# Patient Record
Sex: Female | Born: 1940 | Race: White | Hispanic: No | Marital: Single | Smoking: Former smoker
Health system: Southern US, Community
[De-identification: ages and names within clinical notes are randomized; demographics above are authoritative.]

## PROBLEM LIST (undated history)

## (undated) DIAGNOSIS — J449 Chronic obstructive pulmonary disease, unspecified: Secondary | ICD-10-CM

## (undated) DIAGNOSIS — F419 Anxiety disorder, unspecified: Secondary | ICD-10-CM

## (undated) DIAGNOSIS — M549 Dorsalgia, unspecified: Secondary | ICD-10-CM

## (undated) DIAGNOSIS — J479 Bronchiectasis, uncomplicated: Secondary | ICD-10-CM

## (undated) DIAGNOSIS — S43006A Unspecified dislocation of unspecified shoulder joint, initial encounter: Secondary | ICD-10-CM

## (undated) DIAGNOSIS — G8929 Other chronic pain: Secondary | ICD-10-CM

## (undated) DIAGNOSIS — Z87891 Personal history of nicotine dependence: Secondary | ICD-10-CM

## (undated) DIAGNOSIS — H3322 Serous retinal detachment, left eye: Secondary | ICD-10-CM

## (undated) DIAGNOSIS — E785 Hyperlipidemia, unspecified: Secondary | ICD-10-CM

## (undated) DIAGNOSIS — C44329 Squamous cell carcinoma of skin of other parts of face: Secondary | ICD-10-CM

## (undated) DIAGNOSIS — F411 Generalized anxiety disorder: Secondary | ICD-10-CM

## (undated) DIAGNOSIS — M8580 Other specified disorders of bone density and structure, unspecified site: Secondary | ICD-10-CM

## (undated) HISTORY — DX: Serous retinal detachment, left eye: H33.22

## (undated) HISTORY — DX: Other chronic pain: G89.29

## (undated) HISTORY — DX: Personal history of nicotine dependence: Z87.891

## (undated) HISTORY — DX: Squamous cell carcinoma of skin of other parts of face: C44.329

## (undated) HISTORY — DX: Bronchiectasis, uncomplicated: J47.9

## (undated) HISTORY — PX: OTHER SURGICAL HISTORY: SHX169

## (undated) HISTORY — DX: Unspecified dislocation of unspecified shoulder joint, initial encounter: S43.006A

## (undated) HISTORY — DX: Chronic obstructive pulmonary disease, unspecified: J44.9

## (undated) HISTORY — DX: Generalized anxiety disorder: F41.1

## (undated) HISTORY — DX: Dorsalgia, unspecified: M54.9

## (undated) HISTORY — DX: Anxiety disorder, unspecified: F41.9

## (undated) HISTORY — DX: Hyperlipidemia, unspecified: E78.5

## (undated) HISTORY — DX: Other specified disorders of bone density and structure, unspecified site: M85.80

---

## 1976-01-03 HISTORY — PX: TOTAL ABDOMINAL HYSTERECTOMY: SHX209

## 1989-01-02 HISTORY — PX: LUMBAR DISC SURGERY: SHX700

## 1995-01-03 DIAGNOSIS — F419 Anxiety disorder, unspecified: Secondary | ICD-10-CM

## 1995-01-03 HISTORY — DX: Anxiety disorder, unspecified: F41.9

## 2003-10-26 ENCOUNTER — Ambulatory Visit (HOSPITAL_COMMUNITY): Admission: RE | Admit: 2003-10-26 | Discharge: 2003-10-26 | Payer: Self-pay | Admitting: Internal Medicine

## 2003-11-04 ENCOUNTER — Ambulatory Visit: Payer: Self-pay | Admitting: Internal Medicine

## 2004-10-03 ENCOUNTER — Ambulatory Visit: Payer: Self-pay | Admitting: Internal Medicine

## 2005-04-27 ENCOUNTER — Ambulatory Visit: Payer: Self-pay | Admitting: Internal Medicine

## 2005-05-11 ENCOUNTER — Ambulatory Visit: Payer: Self-pay | Admitting: Internal Medicine

## 2005-10-27 ENCOUNTER — Ambulatory Visit: Payer: Self-pay | Admitting: Internal Medicine

## 2006-01-03 ENCOUNTER — Ambulatory Visit: Payer: Self-pay | Admitting: Internal Medicine

## 2006-01-03 LAB — CONVERTED CEMR LAB
ALT: 19 units/L (ref 0–40)
AST: 26 units/L (ref 0–37)
Albumin: 3.8 g/dL (ref 3.5–5.2)
Alkaline Phosphatase: 83 units/L (ref 39–117)
BUN: 16 mg/dL (ref 6–23)
Basophils Absolute: 0.1 10*3/uL (ref 0.0–0.1)
Basophils Relative: 0.9 % (ref 0.0–1.0)
Bilirubin Urine: NEGATIVE
CO2: 32 meq/L (ref 19–32)
Calcium: 9.5 mg/dL (ref 8.4–10.5)
Chloride: 102 meq/L (ref 96–112)
Chol/HDL Ratio, serum: 4
Cholesterol: 239 mg/dL (ref 0–200)
Creatinine, Ser: 0.9 mg/dL (ref 0.4–1.2)
Eosinophil percent: 3.3 % (ref 0.0–5.0)
GFR calc non Af Amer: 67 mL/min
Glomerular Filtration Rate, Af Am: 81 mL/min/{1.73_m2}
Glucose, Bld: 93 mg/dL (ref 70–99)
HCT: 39.7 % (ref 36.0–46.0)
HDL: 60.3 mg/dL (ref >39.0)
Hemoglobin, Urine: NEGATIVE
Hemoglobin: 14.1 g/dL (ref 12.0–15.0)
Ketones, ur: NEGATIVE mg/dL
LDL DIRECT: 144 mg/dL
Lymphocytes Relative: 32.2 % (ref 12.0–46.0)
MCHC: 35.5 g/dL (ref 30.0–36.0)
MCV: 91.4 fL (ref 78.0–100.0)
Monocytes Absolute: 1.1 10*3/uL — ABNORMAL HIGH (ref 0.2–0.7)
Monocytes Relative: 10.9 % (ref 3.0–11.0)
Neutro Abs: 5.6 10*3/uL (ref 1.4–7.7)
Neutrophils Relative %: 52.7 % (ref 43.0–77.0)
Nitrite: NEGATIVE
Platelets: 304 10*3/uL (ref 150–400)
Potassium: 4.5 meq/L (ref 3.5–5.1)
RBC: 4.34 M/uL (ref 3.87–5.11)
RDW: 11.7 % (ref 11.5–14.6)
Sodium: 139 meq/L (ref 135–145)
Specific Gravity, Urine: 1.025 (ref 1.000–1.03)
TSH: 2.46 microintl units/mL (ref 0.35–5.50)
Total Bilirubin: 0.6 mg/dL (ref 0.3–1.2)
Total Protein, Urine: NEGATIVE mg/dL
Total Protein: 6.8 g/dL (ref 6.0–8.3)
Triglyceride fasting, serum: 114 mg/dL (ref 0–149)
Urine Glucose: NEGATIVE mg/dL
Urobilinogen, UA: 0.2 (ref 0.0–1.0)
VLDL: 23 mg/dL (ref 0–40)
WBC: 10.4 10*3/uL (ref 4.5–10.5)
pH: 6 (ref 5.0–8.0)

## 2006-01-09 ENCOUNTER — Ambulatory Visit: Payer: Self-pay | Admitting: Internal Medicine

## 2006-02-05 ENCOUNTER — Ambulatory Visit: Payer: Self-pay | Admitting: Internal Medicine

## 2006-06-04 ENCOUNTER — Ambulatory Visit: Payer: Self-pay | Admitting: Internal Medicine

## 2006-06-04 LAB — CONVERTED CEMR LAB
ALT: 15 units/L (ref 0–40)
AST: 22 units/L (ref 0–37)
Cholesterol: 157 mg/dL (ref 0–200)
HDL: 50.9 mg/dL (ref >39.0)
LDL Cholesterol: 88 mg/dL (ref 0–99)
Total CHOL/HDL Ratio: 3.1
Triglycerides: 89 mg/dL (ref 0–149)
VLDL: 18 mg/dL (ref 0–40)

## 2006-06-18 ENCOUNTER — Ambulatory Visit: Payer: Self-pay | Admitting: Internal Medicine

## 2006-06-27 ENCOUNTER — Ambulatory Visit: Payer: Self-pay | Admitting: Gastroenterology

## 2006-07-03 HISTORY — PX: COLONOSCOPY: SHX174

## 2006-07-09 ENCOUNTER — Ambulatory Visit: Payer: Self-pay | Admitting: Gastroenterology

## 2006-09-26 ENCOUNTER — Encounter: Payer: Self-pay | Admitting: *Deleted

## 2006-09-26 DIAGNOSIS — Z9079 Acquired absence of other genital organ(s): Secondary | ICD-10-CM | POA: Insufficient documentation

## 2006-09-26 DIAGNOSIS — M545 Low back pain, unspecified: Secondary | ICD-10-CM | POA: Insufficient documentation

## 2006-09-26 DIAGNOSIS — E785 Hyperlipidemia, unspecified: Secondary | ICD-10-CM

## 2006-09-26 DIAGNOSIS — G8929 Other chronic pain: Secondary | ICD-10-CM

## 2006-09-26 HISTORY — DX: Hyperlipidemia, unspecified: E78.5

## 2006-10-26 ENCOUNTER — Telehealth: Payer: Self-pay | Admitting: Internal Medicine

## 2007-04-30 ENCOUNTER — Encounter: Payer: Self-pay | Admitting: Internal Medicine

## 2007-05-16 ENCOUNTER — Ambulatory Visit: Payer: Self-pay | Admitting: Internal Medicine

## 2007-05-16 DIAGNOSIS — R079 Chest pain, unspecified: Secondary | ICD-10-CM | POA: Insufficient documentation

## 2007-05-16 DIAGNOSIS — R0789 Other chest pain: Secondary | ICD-10-CM | POA: Insufficient documentation

## 2007-05-22 ENCOUNTER — Encounter (INDEPENDENT_AMBULATORY_CARE_PROVIDER_SITE_OTHER): Payer: Self-pay | Admitting: *Deleted

## 2007-05-29 ENCOUNTER — Ambulatory Visit: Payer: Self-pay

## 2007-05-29 ENCOUNTER — Encounter: Payer: Self-pay | Admitting: Cardiology

## 2007-10-29 ENCOUNTER — Telehealth: Payer: Self-pay | Admitting: Internal Medicine

## 2007-11-06 ENCOUNTER — Telehealth: Payer: Self-pay | Admitting: Internal Medicine

## 2008-03-03 ENCOUNTER — Telehealth: Payer: Self-pay | Admitting: Internal Medicine

## 2008-04-14 ENCOUNTER — Telehealth: Payer: Self-pay | Admitting: Internal Medicine

## 2008-09-08 ENCOUNTER — Telehealth: Payer: Self-pay | Admitting: Internal Medicine

## 2009-02-02 LAB — HM MAMMOGRAPHY: HM Mammogram: NORMAL

## 2009-02-15 ENCOUNTER — Ambulatory Visit: Payer: Self-pay | Admitting: Internal Medicine

## 2009-02-15 DIAGNOSIS — Z87891 Personal history of nicotine dependence: Secondary | ICD-10-CM | POA: Insufficient documentation

## 2009-02-15 LAB — CONVERTED CEMR LAB
ALT: 16 units/L (ref 0–35)
AST: 23 units/L (ref 0–37)
Albumin: 4.1 g/dL (ref 3.5–5.2)
Alkaline Phosphatase: 95 units/L (ref 39–117)
BUN: 13 mg/dL (ref 6–23)
Basophils Absolute: 0.1 10*3/uL (ref 0.0–0.1)
Basophils Relative: 1 % (ref 0.0–3.0)
Bilirubin, Direct: 0.1 mg/dL (ref 0.0–0.3)
CO2: 32 meq/L (ref 19–32)
Calcium: 9.5 mg/dL (ref 8.4–10.5)
Chloride: 100 meq/L (ref 96–112)
Cholesterol: 287 mg/dL — ABNORMAL HIGH (ref 0–200)
Creatinine, Ser: 0.7 mg/dL (ref 0.4–1.2)
Direct LDL: 190.8 mg/dL
Eosinophils Absolute: 0.2 10*3/uL (ref 0.0–0.7)
Eosinophils Relative: 2.2 % (ref 0.0–5.0)
GFR calc non Af Amer: 88.53 mL/min (ref >60)
Glucose, Bld: 87 mg/dL (ref 70–99)
HCT: 40.4 % (ref 36.0–46.0)
HDL: 59 mg/dL (ref >39.00)
Hemoglobin: 13.1 g/dL (ref 12.0–15.0)
Lymphocytes Relative: 24.2 % (ref 12.0–46.0)
Lymphs Abs: 2.1 10*3/uL (ref 0.7–4.0)
MCHC: 32.5 g/dL (ref 30.0–36.0)
MCV: 93.3 fL (ref 78.0–100.0)
Monocytes Absolute: 0.9 10*3/uL (ref 0.1–1.0)
Monocytes Relative: 11 % (ref 3.0–12.0)
Neutro Abs: 5.2 10*3/uL (ref 1.4–7.7)
Neutrophils Relative %: 61.6 % (ref 43.0–77.0)
Platelets: 294 10*3/uL (ref 150.0–400.0)
Potassium: 4.4 meq/L (ref 3.5–5.1)
RBC: 4.32 M/uL (ref 3.87–5.11)
RDW: 11.8 % (ref 11.5–14.6)
Sodium: 140 meq/L (ref 135–145)
TSH: 2.01 microintl units/mL (ref 0.35–5.50)
Total Bilirubin: 0.6 mg/dL (ref 0.3–1.2)
Total CHOL/HDL Ratio: 5
Total Protein: 7.5 g/dL (ref 6.0–8.3)
Triglycerides: 167 mg/dL — ABNORMAL HIGH (ref 0.0–149.0)
VLDL: 33.4 mg/dL (ref 0.0–40.0)
WBC: 8.5 10*3/uL (ref 4.5–10.5)

## 2009-03-03 ENCOUNTER — Telehealth: Payer: Self-pay | Admitting: Internal Medicine

## 2009-03-23 ENCOUNTER — Encounter: Payer: Self-pay | Admitting: Internal Medicine

## 2009-03-25 ENCOUNTER — Telehealth: Payer: Self-pay | Admitting: Internal Medicine

## 2009-04-20 ENCOUNTER — Ambulatory Visit: Payer: Self-pay | Admitting: Internal Medicine

## 2009-05-11 ENCOUNTER — Ambulatory Visit: Payer: Self-pay | Admitting: Internal Medicine

## 2009-05-11 LAB — CONVERTED CEMR LAB
ALT: 16 units/L (ref 0–35)
AST: 27 units/L (ref 0–37)
Albumin: 4.2 g/dL (ref 3.5–5.2)
Alkaline Phosphatase: 89 units/L (ref 39–117)
Bilirubin, Direct: 0.1 mg/dL (ref 0.0–0.3)
Cholesterol: 208 mg/dL — ABNORMAL HIGH (ref 0–200)
Direct LDL: 132.4 mg/dL
HDL: 59.1 mg/dL (ref >39.00)
Total Bilirubin: 0.6 mg/dL (ref 0.3–1.2)
Total CHOL/HDL Ratio: 4
Total Protein: 7.3 g/dL (ref 6.0–8.3)
Triglycerides: 96 mg/dL (ref 0.0–149.0)
VLDL: 19.2 mg/dL (ref 0.0–40.0)

## 2009-05-17 ENCOUNTER — Encounter: Payer: Self-pay | Admitting: Internal Medicine

## 2009-07-16 ENCOUNTER — Telehealth: Payer: Self-pay | Admitting: Internal Medicine

## 2009-09-27 ENCOUNTER — Encounter: Payer: Self-pay | Admitting: Internal Medicine

## 2009-12-31 ENCOUNTER — Telehealth: Payer: Self-pay | Admitting: Internal Medicine

## 2010-02-01 NOTE — Letter (Signed)
[facility] [address] [place], [place]  [address] Phone: [phone]      May 17, 2009   Megan Peterson [address] [place], [place] [address]  RE:  LAB RESULTS  Dear  [name],  The following is an interpretation of your most recent lab tests.  Please take note of any instructions provided or changes to medications that have resulted from your lab work.  LIPID PANEL:  Good - no changes needed Triglyceride: 96.0   Cholesterol: 208   LDL: 88   HDL: 59.10   Chol/HDL%:  4     Cholesterol is very close to goal. No changes in regimen. Call for quesitons.   Sincerely Yours,    Jacques Navy MD Patient: Megan Peterson Note: All result statuses are Final unless otherwise noted.  Tests: (1) Lipid Panel (LIPID)   Cholesterol          [H]  208 mg/dL                   2-956     ATP III Classification            Desirable:  < 200 mg/dL                    Borderline High:  200 - 239 mg/dL               High:  > = 240 mg/dL   Triglycerides             96.0 mg/dL                  2.1-308.6     Normal:  <150 mg/dL     Borderline High:  578 - 199 mg/dL   HDL                       46.96 mg/dL                 >29.52   VLDL Cholesterol          19.2 mg/dL                  8.4-13.2  CHO/HDL Ratio:  CHD Risk                             4                    Men          Women     1/2 Average Risk     3.4          3.3     Average Risk          5.0          4.4     2X Average Risk          9.6          7.1     3X Average Risk          15.0          11.0                           Tests: (2) Hepatic/Liver Function Panel (HEPATIC)   Total Bilirubin           0.6 mg/dL  0.3-1.2   Direct Bilirubin          0.1 mg/dL                   1.6-1.0   Alkaline Phosphatase      89 U/L                      39-117   AST                       27 U/L                      0-37   ALT                       16 U/L                      0-35   Total Protein              7.3 g/dL                    9.6-0.4   Albumin                   4.2 g/dL                    5.4-0.9  Tests: (3) Cholesterol LDL - Direct (DIRLDL)  Cholesterol LDL - Direct                             132.4 mg/dL     Optimal:  <811 mg/dL     Near or Above Optimal:  100-129 mg/dL     Borderline High:  914-782 mg/dL     High:  956-213 mg/dL     Very High:  >086 mg/dL

## 2010-02-01 NOTE — Assessment & Plan Note (Signed)
Summary: 3 sk f/u per men/#/cd   Vital Signs:  Patient profile:   70 year old female Height:      58 inches Weight:      129 pounds BMI:     27.06 O2 Sat:      96 % on Room air Temp:     97.3 degrees F oral Pulse rate:   75 / minute BP sitting:   136 / 82  (left arm) Cuff size:   regular  Vitals Entered By: Bill Salinas CMA (May 11, 2009 10:58 AM)  O2 Flow:  Room air CC: Megan Peterson here for a follow up visit on pain management/ ab   Primary Care Provider:  Jacques Navy MD  CC:  Megan Peterson here for a follow up visit on pain management/ ab.  History of Present Illness: Patient presents for pain management follow-up. She is intolerant of meloxicam - causes stomach upset. Other anti-9inflammaotry products haven't helped. She does feel she is geting some relief from gabpentin and is willing to increase the dose.   Current Medications (verified): 1)  Xanax 0.5 Mg  Tabs (Alprazolam) .... Take One Tablet 3 Times Daily 2)  Ibuprofen 600 Mg  Tabs (Ibuprofen) .... Take As Needed 3)  Benadryl 25 Mg  Caps (Diphenhydramine Hcl) .... As Needed 4)  Nitrostat 0.4 Mg  Subl (Nitroglycerin) .Marland Kitchen.. 1 Sl Q 5 Min As Needed Chest Pain 5)  Desoximetasone 0.05 % Crea (Desoximetasone) .... Apply Two Times A Day To Rash On Legs. Do Not Apply To Face. 6)  Lovastatin 20 Mg Tabs (Lovastatin) .Marland Kitchen.. 1 By Mouth Qpm For Cholesterol 7)  Hydrocodone-Acetaminophen 5-325 Mg Tabs (Hydrocodone-Acetaminophen) .Marland Kitchen.. 1 By Mouth Q 6 Hrs 8)  Meloxicam 15 Mg Tabs (Meloxicam) .Marland Kitchen.. 1 By Mouth Once Daily 9)  Gabapentin 100 Mg Caps (Gabapentin) .Marland Kitchen.. 1 At Bedtime X 5 Days, 2 At Bedtime X 5 Days, 3 At Bedtime X 5 Days, 3 Two Times A Day X5, 3 Three Times A Day X 5  Allergies (verified): 1)  ! Demerol PMH-FH-SH reviewed-no changes except otherwise noted  Review of Systems  The patient denies anorexia, weight loss, chest pain, dyspnea on exertion, abdominal pain, severe indigestion/heartburn, muscle weakness, difficulty walking, depression,  abnormal bleeding, and angioedema.    Physical Exam  General:  alert, well-developed, well-nourished, and well-hydrated.   Head:  normocephalic and atraumatic.   Eyes:  corneas and lenses clear and no injection.   Lungs:  normal respiratory effort, normal breath sounds, and no wheezes.   Heart:  normal rate and regular rhythm.   Msk:  no joint tenderness, no joint warmth, and no redness over joints.   Pulses:  2+ radial Neurologic:  alert & oriented X3, cranial nerves II-XII intact, and gait normal.   Skin:  turgor normal and color normal.   Psych:  Oriented X3, memory intact for recent and remote, normally interactive, and good eye contact.     Impression & Recommendations:  Problem # 1:  BACK PAIN, CHRONIC (ICD-724.5) reveiwed present strategy.   Plan  D/C meloxicam. She has also failed other non-steroidals         increase gabapentin to 800mg  three times a day          continue hudorcodone/APAP 5/325  The following medications were removed from the medication list:    Meloxicam 15 Mg Tabs (Meloxicam) .Marland Kitchen... 1 by mouth once daily Her updated medication list for this problem includes:    Ibuprofen 600 Mg Tabs (Ibuprofen) .Marland KitchenMarland KitchenMarland KitchenMarland Kitchen  Take as needed    Hydrocodone-acetaminophen 5-325 Mg Tabs (Hydrocodone-acetaminophen) .Marland Kitchen... 1 by mouth q 6 hrs  Complete Medication List: 1)  Xanax 0.5 Mg Tabs (Alprazolam) .... Take one tablet 3 times daily 2)  Ibuprofen 600 Mg Tabs (Ibuprofen) .... Take as needed 3)  Benadryl 25 Mg Caps (Diphenhydramine hcl) .... As needed 4)  Nitrostat 0.4 Mg Subl (Nitroglycerin) .Marland Kitchen.. 1 sl q 5 min as needed chest pain 5)  Desoximetasone 0.05 % Crea (Desoximetasone) .... Apply two times a day to rash on legs. do not apply to face. 6)  Lovastatin 20 Mg Tabs (Lovastatin) .Marland Kitchen.. 1 by mouth qpm for cholesterol 7)  Hydrocodone-acetaminophen 5-325 Mg Tabs (Hydrocodone-acetaminophen) .Marland Kitchen.. 1 by mouth q 6 hrs 8)  Gabapentin 800 Mg Tabs (Gabapentin) .Marland Kitchen.. 1 by mouth three times a  day  Other Orders: TLB-Lipid Panel (80061-LIPID) TLB-Hepatic/Liver Function Pnl (80076-HEPATIC) Prescriptions: GABAPENTIN 800 MG TABS (GABAPENTIN) 1 by mouth three times a day  #90 x 12   Entered and Authorized by:   Jacques Navy MD   Signed by:   Jacques Navy MD on 05/12/2009   Method used:   Electronically to        [facility]       [address]       [place], [place]  [address]       Ph: [phone]       Fax: [phone]   RxID:   [id number]      Immunization History:  Tetanus/Td Immunization History:    Tetanus/Td:  declined (05/11/2009)  Influenza Immunization History:    Influenza:  declined (05/11/2009)  Pneumovax Immunization History:    Pneumovax:  declined (05/11/2009)

## 2010-02-01 NOTE — Assessment & Plan Note (Signed)
Summary: f/u per sarah / # / cd   Vital Signs:  Patient profile:   70 year old female Height:      58 inches (147.32 cm) Weight:      127.50 pounds (57.95 kg) BMI:     26.74 O2 Sat:      93 % on Room air Temp:     97.8 degrees F (36.56 degrees C) oral Pulse rate:   72 / minute BP sitting:   122 / 68  (left arm)  Vitals Entered By: Brenton Grills (April 20, 2009 10:30 AM)  O2 Flow:  Room air CC: pt here for follow up visit/pt states she has not taken or used Butrans patch/aj   Primary Care Provider:  Jacques Navy MD  CC:  pt here for follow up visit/pt states she has not taken or used Butrans patch/aj.  History of Present Illness: Patient presents to discuss pain management. She has a long history of chronic back pain and did have surgery 10+ years ago. She had been on darvocet for years but this is now off market. She did not get relief with Tramadol.  Preventive Screening-Counseling & Management  Alcohol-Tobacco     Year Quit: 1993  Current Medications (verified): 1)  Xanax 0.5 Mg  Tabs (Alprazolam) .... Take One Tablet 3 Times Daily 2)  Ibuprofen 600 Mg  Tabs (Ibuprofen) .... Take As Needed 3)  Benadryl 25 Mg  Caps (Diphenhydramine Hcl) .... As Needed 4)  Nitrostat 0.4 Mg  Subl (Nitroglycerin) .Marland Kitchen.. 1 Sl Q 5 Min As Needed Chest Pain 5)  Desoximetasone 0.05 % Crea (Desoximetasone) .... Apply Two Times A Day To Rash On Legs. Do Not Apply To Face. 6)  Lovastatin 20 Mg Tabs (Lovastatin) .Marland Kitchen.. 1 By Mouth Qpm For Cholesterol 7)  Butrans 5 Mcg/hr Ptwk (Buprenorphine) .... Apply One New Patch Every 7 Days. 8)  Hydrocodone-Acetaminophen 5-325 Mg Tabs (Hydrocodone-Acetaminophen) .Marland Kitchen.. 1 By Mouth Q 6 Hrs  Allergies (verified): 1)  ! Demerol  Past History:  Past Medical History: Last updated: 09/26/2006 BACK PAIN, CHRONIC (ICD-724.5) HYPERLIPIDEMIA (ICD-272.4)    Past Surgical History: Last updated: 09/26/2006 * Hx of BLADDER TACK HYSTERECTOMY, HX OF  (ICD-V45.77)  Family History: Last updated: 05/16/2007 mother- CAD GM - CAD/MI  Social History: Last updated: 05/16/2007 HSG divorced work: disability  Review of Systems  The patient denies anorexia, fever, weight loss, weight gain, decreased hearing, chest pain, syncope, dyspnea on exertion, abdominal pain, muscle weakness, difficulty walking, and depression.    Physical Exam  General:  Well-developed,well-nourished,in no acute distress; alert,appropriate and cooperative throughout examination Head:  normocephalic and atraumatic.   Eyes:  pupils equal and pupils round.   Neck:  supple and full ROM.   Lungs:  normal respiratory effort and normal breath sounds.   Heart:  normal rate and regular rhythm.   Msk:  nl stand, flex, walk, toe/heel walk, step-up, SLR sitting. Nl DTRs, nl sensation to light touch. Tender to palpation CVA lower lumbar spine.    Impression & Recommendations:  Problem # 1:  BACK PAIN, CHRONIC (ICD-724.5) Patient with long standing chronic back pain. Discussed pain management and the concept of using multiple drugs to control pain and minimize use of narcotics.  Plan - APAP three times a day           NSAIDs - Meloxicam 15mg  once daily            Gabapentin on tapering up dosing with a goal of  600mg  three times a day.           Hydrocodone/APAP as needed with caution to not double dose the APAP           ROV 4-6 weeks.   The following medications were removed from the medication list:    Butrans 5 Mcg/hr Ptwk (Buprenorphine) .Marland Kitchen... Apply one new patch every 7 days. Her updated medication list for this problem includes:    Ibuprofen 600 Mg Tabs (Ibuprofen) .Marland Kitchen... Take as needed    Hydrocodone-acetaminophen 5-325 Mg Tabs (Hydrocodone-acetaminophen) .Marland Kitchen... 1 by mouth q 6 hrs    Meloxicam 15 Mg Tabs (Meloxicam) .Marland Kitchen... 1 by mouth once daily  Complete Medication List: 1)  Xanax 0.5 Mg Tabs (Alprazolam) .... Take one tablet 3 times daily 2)  Ibuprofen  600 Mg Tabs (Ibuprofen) .... Take as needed 3)  Benadryl 25 Mg Caps (Diphenhydramine hcl) .... As needed 4)  Nitrostat 0.4 Mg Subl (Nitroglycerin) .Marland Kitchen.. 1 sl q 5 min as needed chest pain 5)  Desoximetasone 0.05 % Crea (Desoximetasone) .... Apply two times a day to rash on legs. do not apply to face. 6)  Lovastatin 20 Mg Tabs (Lovastatin) .Marland Kitchen.. 1 by mouth qpm for cholesterol 7)  Hydrocodone-acetaminophen 5-325 Mg Tabs (Hydrocodone-acetaminophen) .Marland Kitchen.. 1 by mouth q 6 hrs 8)  Meloxicam 15 Mg Tabs (Meloxicam) .Marland Kitchen.. 1 by mouth once daily 9)  Gabapentin 100 Mg Caps (Gabapentin) .Marland Kitchen.. 1 at bedtime x 5 days, 2 at bedtime x 5 days, 3 at bedtime x 5 days, 3 two times a day x5, 3 three times a day x 5 Prescriptions: GABAPENTIN 100 MG CAPS (GABAPENTIN) 1 at bedtime x 5 days, 2 at bedtime x 5 days, 3 at bedtime x 5 days, 3 two times a day x5, 3 three times a day x 5  #105 x 0   Entered by:   Ami Bullins CMA   Authorized by:   Jacques Navy MD   Signed by:   Bill Salinas CMA on 04/20/2009   Method used:   Electronically to        [facility]       [address]       [place], [place]  [address]       Ph: [phone]       Fax: [phone]   RxID:   [id number] MELOXICAM 15 MG TABS (MELOXICAM) 1 by mouth once daily  #30 x 12   Entered by:   Ami Bullins CMA   Authorized by:   Jacques Navy MD   Signed by:   Bill Salinas CMA on 04/20/2009   Method used:   Electronically to        [facility]       [address]       [place], [place]  [address]       Ph: [phone]       Fax: [phone]   RxID:   [id number] HYDROCODONE-ACETAMINOPHEN 5-325 MG TABS (HYDROCODONE-ACETAMINOPHEN) 1 by mouth q 6 hrs  #60 x 2   Entered and Authorized by:   Jacques Navy MD   Signed by:   Jacques Navy MD on 04/20/2009   Method used:   Print then Give to Patient   RxID:   [id number] MELOXICAM  15 MG TABS (MELOXICAM) 1 by mouth once daily  #30 x 12  Entered and Authorized by:   Jacques Navy MD   Signed by:   Jacques Navy MD on 04/20/2009   Method used:   Electronically to        [facility] [address] (retail)       [address]       [place], [place]  [address]       Ph: [phone]       Fax: [phone]   RxID:   [id number] GABAPENTIN 100 MG CAPS (GABAPENTIN) 1 at bedtime x 5 days, 2 at bedtime x 5 days, 3 at bedtime x 5 days, 3 two times a day x5, 3 three times a day x 5  #105 x 0   Entered and Authorized by:   Jacques Navy MD   Signed by:   Jacques Navy MD on 04/20/2009   Method used:   Electronically to        [facility] [address] (retail)       [address]       [place], [place]  [address]       Ph: [phone]       Fax: [phone]   RxID:   [id number]    Preventive Care Screening  Mammogram:    Date:  02/02/2009    Results:  normal

## 2010-02-01 NOTE — Consult Note (Signed)
Summary: [facility]  [facility]   Imported By: Sherian Rein 10/08/2009 07:52:50  _____________________________________________________________________  External Attachment:    Type:   Image     Comment:   External Document

## 2010-02-01 NOTE — Progress Notes (Signed)
Summary: med request/[facility] pt  Phone Note Refill Request Message from:  Fax from Pharmacy on March 03, 2009 1:58 PM   Rx request for Tramadol HCL 50mg  1-2 tablets 8hours as needed  #60 sent by [facility]. Darvocet shows on medication list and not tramadol. Please advise if oK  Initial call taken by: Rock Nephew CMA,  March 03, 2009 2:02 PM  Follow-up for Phone Call        faxed to pharmacy Follow-up by: Margaret Pyle, CMA,  March 03, 2009 2:37 PM    New/Updated Medications: TRAMADOL HCL 50 MG TABS (TRAMADOL HCL) 1 by mouth q 6 hrs as needed Prescriptions: TRAMADOL HCL 50 MG TABS (TRAMADOL HCL) 1 by mouth q 6 hrs as needed  #60 x 1   Entered and Authorized by:   Corwin Levins MD   Signed by:   Corwin Levins MD on 03/03/2009   Method used:   Print then Give to Patient   RxID:   [id number]  done hardcopy to [facility] side B - dahlia Corwin Levins MD  March 03, 2009 2:31 PM

## 2010-02-01 NOTE — Assessment & Plan Note (Signed)
Summary: BREAKING OUT ON LEGS/RX REFILLS-LB   Vital Signs:  Patient profile:   70 year old female Height:      58 inches Weight:      128 pounds BMI:     26.85 O2 Sat:      98 % on Room air Temp:     97.2 degrees F oral Pulse rate:   85 / minute BP sitting:   138 / 78  (left arm) Cuff size:   regular  Vitals Entered By: Bill Salinas CMA (February 15, 2009 9:05 AM)  O2 Flow:  Room air CC: pt c/o rash on both legs itching in nature x 2 weeks/ ab   Primary Care Provider:  Jacques Navy MD  CC:  pt c/o rash on both legs itching in nature x 2 weeks/ ab.  History of Present Illness: Not seen for almost two years. she has done well in the interval. She did have a NST in May '09 - results pending to EMR. She has not had any pain or cardiac symptoms. She has been feeling well.  She presents today for a rash on the distal aspect of both legs and some scattered erythematous macular lesions on the arms. these are intermittently pruritic. No new contact materials of any kind. She has tried a variety many over the counter treatments including cortisone. She has had flea bites in the past but these do not itch like previous episodes.  Current Medications (verified): 1)  Xanax 0.5 Mg  Tabs (Alprazolam) .... Take One Tablet 3 Times Daily 2)  Darvocet-N 100 100-650 Mg  Tabs (Propoxyphene N-Apap) .... Take One Tablet Every 4-6 Hours As Needed 3)  Ibuprofen 600 Mg  Tabs (Ibuprofen) .... Take As Needed 4)  Benadryl 25 Mg  Caps (Diphenhydramine Hcl) .... As Needed 5)  Nitrostat 0.4 Mg  Subl (Nitroglycerin) .Marland Kitchen.. 1 Sl Q 5 Min As Needed Chest Pain  Allergies (verified): 1)  ! Demerol  Past History:  Past Medical History: Last updated: 09/26/2006 BACK PAIN, CHRONIC (ICD-724.5) HYPERLIPIDEMIA (ICD-272.4)    Past Surgical History: Last updated: 09/26/2006 * Hx of BLADDER TACK HYSTERECTOMY, HX OF (ICD-V45.77)  Family History: Last updated: 05/16/2007 mother- CAD GM - CAD/MI  Social  History: Last updated: 05/16/2007 HSG divorced work: disability  Risk Factors: Smoking Status: quit (09/26/2006)  Review of Systems  The patient denies anorexia, fever, weight loss, weight gain, hoarseness, chest pain, syncope, dyspnea on exertion, headaches, hemoptysis, abdominal pain, melena, severe indigestion/heartburn, muscle weakness, transient blindness, depression, abnormal bleeding, angioedema, and breast masses.    Physical Exam  General:  Well-developed,well-nourished,in no acute distress; alert,appropriate and cooperative throughout examination Head:  Normocephalic and atraumatic without obvious abnormalities. No apparent alopecia or balding. Eyes:  No corneal or conjunctival inflammation noted. EOMI. Perrla. Funduscopic exam benign, without hemorrhages, exudates or papilledema. Vision grossly normal. Ears:  External ear exam shows no significant lesions or deformities.  Otoscopic examination reveals clear canals, tympanic membranes are intact bilaterally without bulging, retraction, inflammation or discharge. Hearing is grossly normal bilaterally. Nose:  no external deformity and no external erythema.   Mouth:  Oral mucosa and oropharynx without lesions or exudates.  Teeth in good repair. Neck:  full ROM, no thyromegaly, and no carotid bruits.   Chest Wall:  No deformities, masses, or tenderness noted. Breasts:  No mass, nodules, thickening, tenderness, bulging, retraction, inflamation, nipple discharge or skin changes noted.   Lungs:  Normal respiratory effort, chest expands symmetrically. Lungs are clear to auscultation,  no crackles or wheezes. Heart:  Normal rate and regular rhythm. S1 and S2 normal without gallop, murmur, click, rub or other extra sounds. Abdomen:  soft, non-tender, normal bowel sounds, no distention, no masses, no abdominal hernia, and no hepatomegaly.   Msk:  normal ROM, no joint tenderness, no joint swelling, no joint warmth, and no joint instability.     Pulses:  2+ radial and DP pulses Extremities:  No clubbing, cyanosis, edema, or deformity noted with normal full range of motion of all joints.   Neurologic:  alert & oriented X3, cranial nerves II-XII intact, strength normal in all extremities, gait normal, and DTRs symmetrical and normal.   Skin:  turgor normal and color normal.  Erythematous macular papular rash on the distal LE bilaterally. No excoriations. SubQ red areas on the forearms.  Cervical Nodes:  No lymphadenopathy noted Axillary Nodes:  No palpable lymphadenopathy no R axillary adenopathy and no L axillary adenopathy.   Psych:  Oriented X3, memory intact for recent and remote, normally interactive, good eye contact, and not anxious appearing.     Impression & Recommendations:  Problem # 1:  SKIN RASH (ICD-782.1)  rash that looks like flea bites.  Plan - topicort.05% apply two times a day to legs.  Her updated medication list for this problem includes:    Desoximetasone 0.05 % Crea (Desoximetasone) .Marland Kitchen... Apply two times a day to rash on legs. do not apply to face.  Problem # 2:  HYPERLIPIDEMIA (ICD-272.4)  for lab today - recommendations to follow.  Orders: TLB-Lipid Panel (80061-LIPID) TLB-Hepatic/Liver Function Pnl (80076-HEPATIC) TLB-TSH (Thyroid Stimulating Hormone) ([address]-UEA) Prescription Created Electronically [phone])  Addendum - LDL 190+  Plan - very high LDL in a range where medical therapy is indicated.            will add lovastatin 20mg  ($4 drug at [facility]/etc)  qPM with repeat lab in 4weeks after starting medication.  Her updated medication list for this problem includes:    Lovastatin 20 Mg Tabs (Lovastatin) .Marland Kitchen... 1 by mouth qpm for cholesterol  Problem # 3:  BACK PAIN, CHRONIC (ICD-724.5) Patient with chronic leg pain from bad back. She has been on darvocet - no longer on the market.  Plan - trial of tramadol 50-100mg  q8. If no relief.....          trial of gabapentin with a fairly brisk  titration up to 400mg  three times a day.   The following medications were removed from the medication list:    Aspirin 81 Mg Tabs (Aspirin) .Marland Kitchen... Take one tablet once daily Her updated medication list for this problem includes:    Darvocet-n 100 100-650 Mg Tabs (Propoxyphene n-apap) .Marland Kitchen... Take one tablet every 4-6 hours as needed    Ibuprofen 600 Mg Tabs (Ibuprofen) .Marland Kitchen... Take as needed  Problem # 4:  Preventive Health Care (ICD-V70.0) Normal exam and unremarkable history. She declines pnuemonia vaccine and flu shot. She will have tetnus. she will be referred to [facility] for mammogram. Routine labs today. CXR due to previous smoking history.  In summary - a nice woman back in for routine care and rash.   Addendum - chest s-ray with COPD/emphysematous changes. No acute disease.   Complete Medication List: 1)  Xanax 0.5 Mg Tabs (Alprazolam) .... Take one tablet 3 times daily 2)  Darvocet-n 100 100-650 Mg Tabs (Propoxyphene n-apap) .... Take one tablet every 4-6 hours as needed 3)  Ibuprofen 600 Mg Tabs (Ibuprofen) .... Take as needed 4)  Benadryl  25 Mg Caps (Diphenhydramine hcl) .... As needed 5)  Nitrostat 0.4 Mg Subl (Nitroglycerin) .Marland Kitchen.. 1 sl q 5 min as needed chest pain 6)  Desoximetasone 0.05 % Crea (Desoximetasone) .... Apply two times a day to rash on legs. do not apply to face. 7)  Lovastatin 20 Mg Tabs (Lovastatin) .Marland Kitchen.. 1 by mouth qpm for cholesterol  Other Orders: TLB-BMP (Basic Metabolic Panel-BMET) (80048-METABOL) TLB-CBC Platelet - w/Differential (85025-CBCD) T-2 View CXR (71020TC) Radiology Referral (Radiology)  Patient: Megan Peterson Note: All result statuses are Final unless otherwise noted.  Tests: (1) Lipid Panel (LIPID)   Cholesterol          [H]  287 mg/dL                   6-045     ATP III Classification            Desirable:  < 200 mg/dL                    Borderline High:  200 - 239 mg/dL               High:  > = 240 mg/dL   Triglycerides        [H]   167.0 mg/dL                 4.0-981.1     Normal:  <150 mg/dL     Borderline High:  914 - 199 mg/dL   HDL                       78.29 mg/dL                 >56.21   VLDL Cholesterol          33.4 mg/dL                  3.0-86.5  CHO/HDL Ratio:  CHD Risk                             5                    Men          Women     1/2 Average Risk     3.4          3.3     Average Risk          5.0          4.4     2X Average Risk          9.6          7.1     3X Average Risk          15.0          11.0                           Tests: (2) Hepatic/Liver Function Panel (HEPATIC)   Total Bilirubin           0.6 mg/dL                   7.8-4.6   Direct Bilirubin          0.1 mg/dL                   9.6-2.9  Alkaline Phosphatase      95 U/L                      39-117   AST                       23 U/L                      0-37   ALT                       16 U/L                      0-35   Total Protein             7.5 g/dL                    1.3-0.8   Albumin                   4.1 g/dL                    6.5-7.8  Tests: (3) BMP (METABOL)   Sodium                    140 mEq/L                   135-145   Potassium                 4.4 mEq/L                   3.5-5.1   Chloride                  100 mEq/L                   96-112   Carbon Dioxide            32 mEq/L                    19-32   Glucose                   87 mg/dL                    46-96   BUN                       13 mg/dL                    2-95   Creatinine                0.7 mg/dL                   2.8-4.1   Calcium                   9.5 mg/dL                   3.2-44.0   GFR                       88.53 mL/min                >60  Tests: (4) CBC Platelet w/Diff (CBCD)   White  Cell Count          8.5 K/uL                    4.5-10.5   Red Cell Count            4.32 Mil/uL                 3.87-5.11   Hemoglobin                13.1 g/dL                   16.1-09.6   Hematocrit                40.4 %                       36.0-46.0   MCV                       93.3 fl                     78.0-100.0   MCHC                      32.5 g/dL                   04.5-40.9   RDW                       11.8 %                      11.5-14.6   Platelet Count            294.0 K/uL                  150.0-400.0   Neutrophil %              61.6 %                      43.0-77.0   Lymphocyte %              24.2 %                      12.0-46.0   Monocyte %                11.0 %                      3.0-12.0   Eosinophils%              2.2 %                       0.0-5.0   Basophils %               1.0 %                       0.0-3.0   Neutrophill Absolute      5.2 K/uL                    1.4-7.7   Lymphocyte Absolute       2.1 K/uL                    0.7-4.0  Monocyte Absolute         0.9 K/uL                    0.1-1.0  Eosinophils, Absolute                             0.2 K/uL                    0.0-0.7   Basophils Absolute        0.1 K/uL                    0.0-0.1  Tests: (5) TSH (TSH)   FastTSH                   2.01 uIU/mL                 0.35-5.50  Tests: (6) Cholesterol LDL - Direct (DIRLDL)  Cholesterol LDL - Direct                             190.8 mg/dL     Optimal:  <416 mg/dL     Near or Above Optimal:  100-129 mg/dL     Borderline High:  606-301 mg/dL     High:  601-093 mg/dL     Very High:  >235 mg/dL  DG CHEST 2 VIEW - [id number]   Clinical Data: Tobacco abuse. Ex-smoker.   CHEST - 2 VIEW   Comparison: 10/13/1993   Findings: The cardiomediastinal silhouette is unremarkable. COPD/emphysema again noted. There is no evidence of focal airspace disease, pulmonary nodules, pulmonary edema, pleural effusion, or pneumothorax. No acute bony abnormalities are identified.   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   Read By:  Laruth Bouchard.D.Prescriptions: LOVASTATIN 20 MG TABS (LOVASTATIN) 1 by mouth qPM for cholesterol  #30 x 12   Entered and Authorized by:    Jacques Navy MD   Signed by:   Jacques Navy MD on 02/15/2009   Method used:   Print then Give to Patient   RxID:   [id number] NITROSTAT 0.4 MG  SUBL (NITROGLYCERIN) 1 sl q 5 min as needed chest pain  #15 x 1   Entered and Authorized by:   Jacques Navy MD   Signed by:   Jacques Navy MD on 02/15/2009   Method used:   Print then Give to Patient   RxID:   [id number] Prudy Feeler 0.5 MG  TABS (ALPRAZOLAM) Take one tablet 3 times daily  #90 x 5   Entered and Authorized by:   Jacques Navy MD   Signed by:   Jacques Navy MD on 02/15/2009   Method used:   Print then Give to Patient   RxID:   [id number] DESOXIMETASONE 0.05 % CREA (DESOXIMETASONE) apply two times a day to rash on legs. Do not apply to face.  #30g x 1   Entered and Authorized by:   Jacques Navy MD   Signed by:   Jacques Navy MD on 02/15/2009   Method used:   Print then Give to Patient   RxID:   [id number]

## 2010-02-01 NOTE — Progress Notes (Signed)
Summary: Tramadol  Phone Note Call from Patient Call back at Home Phone [phone]   Summary of Call: Patient called stating that Tramadol makes her feel horrible. Please send alternative to [facility] on [address]. Please advise. Initial call taken by: Lucious Groves,  March 25, 2009 11:07 AM  Follow-up for Phone Call        done Follow-up by: Etta Grandchild MD,  March 25, 2009 11:53 AM  Additional Follow-up for Phone Call Additional follow up Details #1::        pt informed she will come get this afternoon, rx up front in metal cabinet. Additional Follow-up by: Ami Bullins CMA,  March 25, 2009 1:38 PM    New/Updated Medications: BUTRANS 5 MCG/HR PTWK (BUPRENORPHINE) Apply one new patch every 7 days. Prescriptions: BUTRANS 5 MCG/HR PTWK (BUPRENORPHINE) Apply one new patch every 7 days.  #4 x 3   Entered and Authorized by:   Etta Grandchild MD   Signed by:   Etta Grandchild MD on 03/25/2009   Method used:   Print then Give to Patient   RxID:   [id number]

## 2010-02-01 NOTE — Progress Notes (Signed)
Summary: PAIN MEDICATION ALTERNATIVE  Phone Note From Pharmacy   Caller: [facility] [phone] opt 0 - [place] Summary of Call: Rx given for patient for butrans patch is not covered by her insurance. Pharmacy spoke with pt to try to help MD with alternative suggestion.  Insurance reccomends fentanyl. Darvocet worked for pt in the past and pharm suggests low dose vicodin.  Initial call taken by: Lamar Sprinkles, CMA,  March 25, 2009 5:47 PM  Follow-up for Phone Call        cancel butrans patch - ok for hydrocodone/APAP  5/325 1 every 6 hours as needed, # 40. Needs an appointment next week to discuss pain management Follow-up by: Jacques Navy MD,  March 26, 2009 10:19 AM  Additional Follow-up for Phone Call Additional follow up Details #1::        Called in rx, canceled previous, pt aware and transferred to scheduler for f/u  Additional Follow-up by: Lamar Sprinkles, CMA,  March 26, 2009 12:53 PM    New/Updated Medications: HYDROCODONE-ACETAMINOPHEN 5-325 MG TABS (HYDROCODONE-ACETAMINOPHEN) 1 by mouth q 6 hrs Prescriptions: HYDROCODONE-ACETAMINOPHEN 5-325 MG TABS (HYDROCODONE-ACETAMINOPHEN) 1 by mouth q 6 hrs  #40 x 0   Entered by:   Lucious Groves   Authorized by:   Tresa Garter MD   Signed by:   Lucious Groves on 03/26/2009   Method used:   Printed then faxed to ...       [facility] [address] (retail)       [address], [address]       [place], [place]  [address]       Ph: [phone]       Fax: [phone]   RxID:   [id number]

## 2010-02-01 NOTE — Progress Notes (Signed)
  Phone Note Refill Request Message from:  Fax from Pharmacy on July 16, 2009 8:42 AM  Refills Requested: Medication #1:  XANAX 0.5 MG  TABS Take one tablet 3 times daily   Last Refilled: 07/15/2009  Medication #2:  HYDROCODONE-ACETAMINOPHEN 5-325 MG TABS 1 by mouth q 6 hrs   Last Refilled: 06/17/2009 Fax from [facility] in [place], please Advise refill  Initial call taken by: Ami Bullins CMA,  July 16, 2009 8:47 AM  Follow-up for Phone Call        OK for refills x 5 Follow-up by: Jacques Navy MD,  July 16, 2009 1:17 PM    Prescriptions: HYDROCODONE-ACETAMINOPHEN 5-325 MG TABS (HYDROCODONE-ACETAMINOPHEN) 1 by mouth q 6 hrs  #60 x 5   Entered by:   Ami Bullins CMA   Authorized by:   Jacques Navy MD   Signed by:   Bill Salinas CMA on 07/16/2009   Method used:   Telephoned to ...       [facility] [address] (retail)       [address]       [place], [place]  [address]       Ph: [phone]       Fax: [phone]   RxID:   [id number] Prudy Feeler 0.5 MG  TABS (ALPRAZOLAM) Take one tablet 3 times daily  #90 x 5   Entered by:   Bill Salinas CMA   Authorized by:   Jacques Navy MD   Signed by:   Bill Salinas CMA on 07/16/2009   Method used:   Telephoned to ...       [facility] [address] (retail)       [address]       [place], [place]  [address]       Ph: [phone]       Fax: [phone]   RxID:   [id number]

## 2010-02-03 NOTE — Progress Notes (Signed)
Summary: RF  Phone Note Refill Request Message from:  Pharmacy  Refills Requested: Medication #1:  XANAX 0.5 MG  TABS Take one tablet 3 times daily  Medication #2:  HYDROCODONE-ACETAMINOPHEN 5-325 MG TABS 1 by mouth q 6 hrs [id number]  Initial call taken by: Lamar Sprinkles, CMA,  December 31, 2009 5:13 PM    Prescriptions: Prudy Feeler 0.5 MG  TABS (ALPRAZOLAM) Take one tablet 3 times daily  #90 x 5   Entered and Authorized by:   Jacques Navy MD   Signed by:   Jacques Navy MD on 12/31/2009   Method used:   Telephoned to ...       [facility] (retail)       [address]       [place], [place]  [address]       Ph: [phone]       Fax: [phone]   RxID:   [id number] HYDROCODONE-ACETAMINOPHEN 5-325 MG TABS (HYDROCODONE-ACETAMINOPHEN) 1 by mouth q 6 hrs  #60 x 5   Entered and Authorized by:   Jacques Navy MD   Signed by:   Jacques Navy MD on 12/31/2009   Method used:   Telephoned to ...       [facility] [address] (retail)       [address]       [place], [place]  [address]       Ph: [phone]       Fax: [phone]   RxID:   [id number]

## 2010-04-03 DIAGNOSIS — S43006A Unspecified dislocation of unspecified shoulder joint, initial encounter: Secondary | ICD-10-CM

## 2010-04-03 HISTORY — DX: Unspecified dislocation of unspecified shoulder joint, initial encounter: S43.006A

## 2010-04-10 ENCOUNTER — Emergency Department (HOSPITAL_COMMUNITY): Payer: Medicare Other

## 2010-04-10 ENCOUNTER — Emergency Department (HOSPITAL_COMMUNITY)
Admission: EM | Admit: 2010-04-10 | Discharge: 2010-04-10 | Disposition: A | Discharge status: Home / Self Care | Payer: Medicare Other | Attending: Emergency Medicine | Admitting: Emergency Medicine

## 2010-04-10 DIAGNOSIS — E7800 Pure hypercholesterolemia, unspecified: Secondary | ICD-10-CM | POA: Insufficient documentation

## 2010-04-10 DIAGNOSIS — M25519 Pain in unspecified shoulder: Secondary | ICD-10-CM | POA: Insufficient documentation

## 2010-04-10 DIAGNOSIS — I10 Essential (primary) hypertension: Secondary | ICD-10-CM | POA: Insufficient documentation

## 2010-04-10 DIAGNOSIS — S42209A Unspecified fracture of upper end of unspecified humerus, initial encounter for closed fracture: Secondary | ICD-10-CM | POA: Insufficient documentation

## 2010-05-20 NOTE — Assessment & Plan Note (Signed)
[facility]                           PRIMARY CARE OFFICE NOTE   Megan, Peterson                         MRN:          [id number]  DATE:01/09/2006                            DOB:          [birth year]    [name] is a very pleasant 70 year old woman who presents for followup  evaluation and exam.  She was last seen October 27, 2005 at which time  she was stable and doing well.  Previous to that she was seen April 27, 2005, again stable and doing well.   PAST MEDICAL HISTORY:  Well documented in my note of October 03, 2004,  with no significant changes noted.   FAMILY HISTORY:  Well documented in my note of October 03, 2004, with no  significant changes noted.   SOCIAL HISTORY:  Well documented in my note of October 03, 2004, with no  significant changes noted.   CURRENT MEDICATIONS:  1. Xanax 0.5 mg t.i.d.  2. Darvocet N-100 q. 4-6 hours.  3. Baby aspirin 81 mg daily.  4. Trazodone 50 mg q.h.s.  5. Simvastatin 40 mg daily.  6. Ibuprofen 600 mg p.r.n.  7. Vicodin 5/500 t.i.d. p.r.n.   REVIEW OF SYSTEMS:  Negative for constitutional, cardiovascular,  respiratory, GI, GU problems.   CHART REVIEW:  Last colorectal screening was in July of 1998 with  flexible sigmoidoscopy.  The patient was last seen in integrative  therapies in 2001.  Last mammogram was December 20, 2005, was negative  study.  Last EKG was November 04, 2003 and this was a normal study.   PHYSICAL EXAMINATION:  Temperature was 97.6, blood pressure 128/77,  pulse 71, weight 125.  GENERAL APPEARANCE:  A well-nourished, well-developed woman in no acute  distress.  HEENT:  Normocephalic/atraumatic, EACs and TMs were normal.  Oropharynx  with native dentition, with no buccal or palate lesions  noted.  Posterior pharynx was clear.  Conjunctivae and sclerae was clear,  PERRLA/EOMI.  FUNDUSCOPIC:  Unremarkable.  NECK: Supple without thyromegaly.  NODES:  No adenopathy was noted in  the cervical supraclavicular regions.  CHEST:  No CVA tenderness.  LUNGS:  Clear with no rales, wheezes, or rhonchi.  BREAST:  Skin was normal, nipples without discharge.  No fixed mass,  lesion, or abnormality was appreciated.  CARDIOVASCULAR:  Was 2+ radial pulse, no JVD or carotid bruits.  She had  a quiet precordium with regular rate and rhythm without murmurs, rubs,  or gallops.  ABDOMEN:  Soft, no guarding or rebound.  No organosplenomegaly was  appreciated.  PELVIC AND RECTAL:  Deferred.  EXTREMITIES:  Without clubbing, cyanosis, edema, or deformity.  NEUROLOGIC:  Nonfocal.  SKIN:  Was clear.   DATABASE:  Hemoglobin 14.1 g, white count 10,400 with a normal  differential.  Chemistries were unremarkable with a serum glucose of 93.  Kidney function normal with a creatinine of 0.9 and a GFR of 67.  Liver  functions were normal.  Cholesterol 239, triglycerides 114, HDL 60.3,  LDL was 144.   ASSESSMENT/PLAN:  1. Hyperlipidemia.  Patient at this time is  on Pravachol 40.  Will      switch at this point to simvastatin 40 mg for better results with      the goal being an LDL cholesterol of 100 or less.  2. Chronic back pain.  This is stable.  The patient is able to enjoy      most of her usual activities.  Her pain is well managed with her      current regimen.  3. Depression.  The patient is doing well with trazodone and Xanax,      and reports that things are stable in her life.  4. Health maintenance.  The patient is current and  up to date with      breast cancer screening.  She wants to defer colorectal cancer      screening until age 28 when she has better coverage for      colonoscopy.  She has had no change in her bowel habit.   SUMMARY:  This is a very pleasant woman who seems to be medically stable  at this time.  She will return to see me on a p.r.n. basis.     Rosalyn Gess Norins, MD  Electronically Signed    MEN/MedQ  DD: 01/10/2006  DT: 01/10/2006  Job #:  [id number]   cc:   Andi Devon

## 2010-05-25 ENCOUNTER — Other Ambulatory Visit: Payer: Self-pay | Admitting: Internal Medicine

## 2010-06-27 ENCOUNTER — Ambulatory Visit (INDEPENDENT_AMBULATORY_CARE_PROVIDER_SITE_OTHER): Payer: Medicare Other | Admitting: Internal Medicine

## 2010-06-27 ENCOUNTER — Telehealth: Payer: Self-pay

## 2010-06-27 ENCOUNTER — Encounter: Payer: Self-pay | Admitting: Internal Medicine

## 2010-06-27 DIAGNOSIS — J40 Bronchitis, not specified as acute or chronic: Secondary | ICD-10-CM

## 2010-06-27 DIAGNOSIS — Z1231 Encounter for screening mammogram for malignant neoplasm of breast: Secondary | ICD-10-CM

## 2010-06-27 DIAGNOSIS — S43006A Unspecified dislocation of unspecified shoulder joint, initial encounter: Secondary | ICD-10-CM

## 2010-06-27 MED ORDER — ALPRAZOLAM 0.5 MG PO TABS: 0.5000 mg | ORAL_TABLET | Freq: Three times a day (TID) | ORAL | Status: DC | PRN

## 2010-06-27 MED ORDER — SULFAMETHOXAZOLE-TRIMETHOPRIM 800-160 MG PO TABS: 1.0000 | ORAL_TABLET | Freq: Two times a day (BID) | ORAL | Status: AC

## 2010-06-27 NOTE — Patient Instructions (Signed)
Cough - probable bronchitis. Plan - septra DS twice a day for 7 days. Mucinex two tablets twice a day for 7 days. robtiussin DM for cough.  Health maintenance - please get a mammogram. My staff will schedule.  Right shoulder - after dislocation there is still loss of range of motion. Plan - referral physical therapy.

## 2010-06-27 NOTE — Progress Notes (Signed)
  Subjective:    Patient ID: Megan Peterson, female    DOB: [birth year], 70 y.o.   MRN: [id number]  HPI Megan Peterson presents for URI symptoms with chest congestion, cough productive of a purulent looking sputum. She denies any fever, no SOB. She has no loss of energy. No other contributing symptoms.   She had traumatic dislocation of right shoulder. ED records pulled and reviewed, including imaging studies.   Past Medical History  Diagnosis Date  . Back pain, chronic   . Hyperlipidemia    Past Surgical History  Procedure Date  . Bladder tack   . Hysterectomy     history of   Family History  Problem Relation Age of Onset  . Coronary artery disease Mother   . Coronary artery disease      grandmother  . Heart attack      grandmother   History   Social History  . Marital Status: Divorced    Spouse Name: N/A    Number of Children: N/A  . Years of Education: N/A   Occupational History  . Not on file.   Social History Main Topics  . Smoking status: Former Smoker    Quit date: 01/03/1991  . Smokeless tobacco: Not on file  . Alcohol Use: Not on file  . Drug Use: Not on file  . Sexually Active: Not on file   Other Topics Concern  . Not on file   Social History Narrative   HSGDivorcedDisability       Review of Systemsb Review of Systems  Constitutional:  Negative for fever, chills, activity change and unexpected weight change.  HEENT:  Negative for hearing loss, ear pain, congestion, neck stiffness and postnasal drip. Negative for sore throat or swallowing problems. Negative for dental complaints.   Eyes: Negative for vision loss or change in visual acuity.  Respiratory: Negative for chest tightness and wheezing.   Cardiovascular: Negative for chest pain and palpitation. No decreased exercise tolerance Gastrointestinal: No change in bowel habit. No bloating or gas. No reflux or indigestion Genitourinary: Negative for urgency, frequency, flank pain and difficulty  urinating.  Musculoskeletal: Negative for myalgias, back pain, arthralgias and gait problem.  Neurological: Negative for dizziness, tremors, weakness and headaches.  Hematological: Negative for adenopathy.  Psychiatric/Behavioral: Negative for behavioral problems and dysphoric mood.       Objective:   Physical Exam Vitals noted Gen'l - WNMWD white woman in no distress HEENT - normal Neck - stable, thyromegaly Chest - no rales, wheeze or rhonchi Nodes - negative supraclavicular or cervical Extremities - no deformity.        Assessment & Plan:  1. URI - symptoms and presentation suggestive of bronchitis.  Plan - Septra DS bid x 7 and supportive care.   2. Shoulder injury - patient had traumatic dislocation of right shoulder that was reduced by Dr. Rennis Chris in the ED. She now has restricted range of motion with adduction  Plan - refer to PT to prevent frozen shoulder and further loss of ROM  3. Health maintenance - she is due for mammography.

## 2010-06-27 NOTE — Telephone Encounter (Signed)
abstract

## 2010-07-12 ENCOUNTER — Encounter: Payer: Self-pay | Admitting: Internal Medicine

## 2010-07-26 ENCOUNTER — Ambulatory Visit: Payer: Medicare Other

## 2010-07-27 ENCOUNTER — Telehealth: Payer: Self-pay | Admitting: *Deleted

## 2010-07-27 MED ORDER — HYDROCODONE-ACETAMINOPHEN 5-325 MG PO TABS: 1.0000 | ORAL_TABLET | Freq: Four times a day (QID) | ORAL | Status: DC | PRN

## 2010-07-27 NOTE — Telephone Encounter (Signed)
Ok with 2 refills

## 2010-07-27 NOTE — Telephone Encounter (Signed)
Patient requesting RF of hydrocodone to go to [facility] in [place].

## 2010-07-27 NOTE — Telephone Encounter (Signed)
Done, pt aware.

## 2010-08-03 DIAGNOSIS — H3322 Serous retinal detachment, left eye: Secondary | ICD-10-CM

## 2010-08-03 HISTORY — DX: Serous retinal detachment, left eye: H33.22

## 2010-08-05 ENCOUNTER — Observation Stay (HOSPITAL_COMMUNITY)
Admission: AD | Admit: 2010-08-05 | Discharge: 2010-08-06 | Disposition: A | Discharge status: Home / Self Care | Payer: Medicare Other | Source: Ambulatory Visit | Attending: Ophthalmology | Admitting: Ophthalmology

## 2010-08-05 ENCOUNTER — Encounter (INDEPENDENT_AMBULATORY_CARE_PROVIDER_SITE_OTHER): Payer: Medicare Other | Admitting: Ophthalmology

## 2010-08-05 DIAGNOSIS — Z01812 Encounter for preprocedural laboratory examination: Secondary | ICD-10-CM | POA: Insufficient documentation

## 2010-08-05 DIAGNOSIS — H33009 Unspecified retinal detachment with retinal break, unspecified eye: Secondary | ICD-10-CM

## 2010-08-05 DIAGNOSIS — H33019 Retinal detachment with single break, unspecified eye: Principal | ICD-10-CM | POA: Insufficient documentation

## 2010-08-05 DIAGNOSIS — H35379 Puckering of macula, unspecified eye: Secondary | ICD-10-CM

## 2010-08-05 DIAGNOSIS — Z87891 Personal history of nicotine dependence: Secondary | ICD-10-CM | POA: Insufficient documentation

## 2010-08-05 DIAGNOSIS — J4489 Other specified chronic obstructive pulmonary disease: Secondary | ICD-10-CM | POA: Insufficient documentation

## 2010-08-05 DIAGNOSIS — M549 Dorsalgia, unspecified: Secondary | ICD-10-CM | POA: Insufficient documentation

## 2010-08-05 DIAGNOSIS — Z01810 Encounter for preprocedural cardiovascular examination: Secondary | ICD-10-CM | POA: Insufficient documentation

## 2010-08-05 DIAGNOSIS — H43819 Vitreous degeneration, unspecified eye: Secondary | ICD-10-CM

## 2010-08-05 DIAGNOSIS — J449 Chronic obstructive pulmonary disease, unspecified: Secondary | ICD-10-CM | POA: Insufficient documentation

## 2010-08-05 DIAGNOSIS — G8929 Other chronic pain: Secondary | ICD-10-CM | POA: Insufficient documentation

## 2010-08-05 LAB — SURGICAL PCR SCREEN
MRSA, PCR: NEGATIVE
Staphylococcus aureus: NEGATIVE

## 2010-08-05 LAB — CBC
HCT: 40.7 % (ref 36.0–46.0)
Hemoglobin: 14.3 g/dL (ref 12.0–15.0)
MCH: 30.8 pg (ref 26.0–34.0)
MCHC: 35.1 g/dL (ref 30.0–36.0)
MCV: 87.7 fL (ref 78.0–100.0)
Platelets: 271 10*3/uL (ref 150–400)
RBC: 4.64 MIL/uL (ref 3.87–5.11)
RDW: 12.8 % (ref 11.5–15.5)
WBC: 13.4 10*3/uL — ABNORMAL HIGH (ref 4.0–10.5)

## 2010-08-12 ENCOUNTER — Inpatient Hospital Stay (INDEPENDENT_AMBULATORY_CARE_PROVIDER_SITE_OTHER): Payer: Medicare Other | Admitting: Ophthalmology

## 2010-08-12 DIAGNOSIS — H33009 Unspecified retinal detachment with retinal break, unspecified eye: Secondary | ICD-10-CM

## 2010-08-17 ENCOUNTER — Encounter (INDEPENDENT_AMBULATORY_CARE_PROVIDER_SITE_OTHER): Payer: Medicare Other | Admitting: Ophthalmology

## 2010-08-17 DIAGNOSIS — H33009 Unspecified retinal detachment with retinal break, unspecified eye: Secondary | ICD-10-CM

## 2010-08-18 NOTE — Op Note (Signed)
  [name], Megan Peterson                  ACCOUNT NO.:  [id number]  MEDICAL RECORD NO.:  [id number]  LOCATION:  5125                         FACILITY:  [facility]  PHYSICIAN:  Beulah Gandy. Ashley Royalty, M.D. DATE OF BIRTH:  01-Jun-1940  DATE OF PROCEDURE:  08/05/2010 DATE OF DISCHARGE:                              OPERATIVE REPORT   ADMISSION DIAGNOSIS:  Rhegmatogenous retinal detachment, left eye.  PROCEDURE:  Scleral buckle, left eye.  Retinal photocoagulation, left eye.  SURGEON:  Beulah Gandy. Ashley Royalty, MD  ASSISTANT:  Rosalie Doctor, MA  ANESTHESIA:  General.  DETAILS:  Usual prep and drape, 360 degrees limbal peritomy, isolation of four rectus muscles on 2-0 silk, localization of break at 12 o'clock. Scleral dissection for 360 degrees to admit a #279 intrascleral implant with a millimeter trimmed from the posterior edge between 3 and 9 o'clock to diathermy placed in the bed.  A 508G radial segment was fashioned to fit beneath the break at 12 o'clock.  The perforation chosen at 2 o'clock.  A moderate amount of clear colorless subretinal fluid came forth in a controlled manner.  The #279 implant was placed around the globe and the joint was placed at 10 o'clock.  The 240 band with 270 sleeve placed around the eye with the joint at 10 o'clock.  The buckle elements were placed.  The 4-0 [place]ilene sutures were placed and 2 per quadrant for total of 8 sutures in the scleral flaps.  Perforation was performed.  The fluid was removed.  A 508G radial segment was placed at 12 o'clock and the scleral flaps were closed.  Indirect ophthalmoscopy showed the retina to be lying nicely and placed on the scleral buckle with a break well supported.  The indirect ophthalmoscope laser was moved into place, 349 burns were placed around the retinal periphery, power was 400 milliwatts, 1000 microns each, and 0.1 seconds each.  The buckle was adjusted and trimmed.  The band was adjusted and trimmed.  Scleral sutures were  knotted and the free ends were removed. The conjunctiva was reposited with 7-0 chromic suture.  Polymyxin and gentamicin were irrigated into tenon's space.  Atropine solution was applied.  Decadron 10 mg was injected into the lower subconjunctival space.  Marcaine was injected around the globe for postop pain.  Closing pressure was 10 with a Barraquer tonometer.  Complications none. Duration 1 hour and 45 minutes.  The patient was awakened and taken to recovery in a satisfactory condition.     Beulah Gandy. Ashley Royalty, M.D.     JDM/MEDQ  D:  08/05/2010  T:  08/06/2010  Job:  [id number]  Electronically Signed by Alan Mulder M.D. on 08/18/2010 08:52:26 PM

## 2010-09-21 ENCOUNTER — Encounter (INDEPENDENT_AMBULATORY_CARE_PROVIDER_SITE_OTHER): Payer: Medicare Other | Admitting: Ophthalmology

## 2010-09-21 DIAGNOSIS — H35379 Puckering of macula, unspecified eye: Secondary | ICD-10-CM

## 2010-09-21 DIAGNOSIS — H33009 Unspecified retinal detachment with retinal break, unspecified eye: Secondary | ICD-10-CM

## 2010-10-17 ENCOUNTER — Telehealth: Payer: Self-pay | Admitting: *Deleted

## 2010-10-17 MED ORDER — HYDROCODONE-ACETAMINOPHEN 5-325 MG PO TABS: 1.0000 | ORAL_TABLET | Freq: Four times a day (QID) | ORAL | Status: DC | PRN

## 2010-10-17 NOTE — Telephone Encounter (Signed)
Ok to refill x 3 

## 2010-10-17 NOTE — Telephone Encounter (Signed)
Refill request for Hydrocodone 5-325 qty 60 sig take one tablet by mouth every 6 hours prn for pain. Last dispensed 09/21/2010. Please Advise refills

## 2010-11-02 ENCOUNTER — Encounter (INDEPENDENT_AMBULATORY_CARE_PROVIDER_SITE_OTHER): Payer: Medicare Other | Admitting: Ophthalmology

## 2010-11-07 ENCOUNTER — Encounter (INDEPENDENT_AMBULATORY_CARE_PROVIDER_SITE_OTHER): Payer: Medicare Other | Admitting: Ophthalmology

## 2010-11-07 DIAGNOSIS — H35359 Cystoid macular degeneration, unspecified eye: Secondary | ICD-10-CM

## 2010-11-07 DIAGNOSIS — H35379 Puckering of macula, unspecified eye: Secondary | ICD-10-CM

## 2010-11-07 DIAGNOSIS — H43819 Vitreous degeneration, unspecified eye: Secondary | ICD-10-CM

## 2010-11-07 DIAGNOSIS — H33009 Unspecified retinal detachment with retinal break, unspecified eye: Secondary | ICD-10-CM

## 2010-12-19 ENCOUNTER — Other Ambulatory Visit: Payer: Self-pay

## 2010-12-19 ENCOUNTER — Encounter (INDEPENDENT_AMBULATORY_CARE_PROVIDER_SITE_OTHER): Payer: Medicare Other | Admitting: Ophthalmology

## 2010-12-19 NOTE — Telephone Encounter (Signed)
Pt called requesting refill of Xanax, please advise.

## 2010-12-21 ENCOUNTER — Telehealth: Payer: Self-pay | Admitting: *Deleted

## 2010-12-21 MED ORDER — ALPRAZOLAM 0.5 MG PO TABS: 0.5000 mg | ORAL_TABLET | Freq: Three times a day (TID) | ORAL | Status: DC | PRN

## 2010-12-21 NOTE — Telephone Encounter (Signed)
Ok for refill x 5 

## 2010-12-21 NOTE — Telephone Encounter (Signed)
Refill request for xanax 0.5 mg SIG take one tablet by mouth three times a day prn QTY 90 Please Advise refills

## 2011-01-10 ENCOUNTER — Telehealth: Payer: Self-pay | Admitting: Internal Medicine

## 2011-01-10 NOTE — Telephone Encounter (Signed)
Per pt she was exposed to flu---Do she need the tamiflu?

## 2011-02-28 ENCOUNTER — Telehealth: Payer: Self-pay

## 2011-02-28 NOTE — Telephone Encounter (Signed)
Pt called requesting a refill of Hydrocodone, please advise.

## 2011-02-28 NOTE — Telephone Encounter (Signed)
Ok x 3  

## 2011-03-01 MED ORDER — HYDROCODONE-ACETAMINOPHEN 5-325 MG PO TABS: 1.0000 | ORAL_TABLET | Freq: Four times a day (QID) | ORAL | Status: DC | PRN

## 2011-03-01 NOTE — Telephone Encounter (Signed)
Pt called again, rx called into [facility].

## 2011-03-28 ENCOUNTER — Ambulatory Visit: Payer: Self-pay | Admitting: Ophthalmology

## 2011-03-28 DIAGNOSIS — I499 Cardiac arrhythmia, unspecified: Secondary | ICD-10-CM

## 2011-04-05 ENCOUNTER — Ambulatory Visit: Payer: Self-pay | Admitting: Ophthalmology

## 2011-04-20 NOTE — Telephone Encounter (Signed)
Reviewed patient record can find no response back  to the patient. No additional documented calls from patient.

## 2011-04-26 ENCOUNTER — Telehealth: Payer: Self-pay | Admitting: Internal Medicine

## 2011-04-26 NOTE — Telephone Encounter (Signed)
The pt called and is hoping to get a call back from the nurse.  She did give details on why she wanted a call back.

## 2011-05-03 NOTE — Telephone Encounter (Signed)
Pt called stating that her Insurance company advised her that it was "mentioned" in her medical record that she has COPD. Pt is concerned because she was told this will double her premium. Pt advised that a Dx of COPD is not listed in her chart but to get more information from [employer] such as the date this was "mentioned" and we would review that record. Pt agreed and will call back with that information.

## 2011-06-05 ENCOUNTER — Other Ambulatory Visit (INDEPENDENT_AMBULATORY_CARE_PROVIDER_SITE_OTHER): Payer: Medicare Other

## 2011-06-05 ENCOUNTER — Encounter: Payer: Self-pay | Admitting: Internal Medicine

## 2011-06-05 ENCOUNTER — Ambulatory Visit (INDEPENDENT_AMBULATORY_CARE_PROVIDER_SITE_OTHER): Payer: Medicare Other | Admitting: Internal Medicine

## 2011-06-05 VITALS — BP 114/72 | HR 87 | Temp 97.2°F | Resp 16 | Wt 124.0 lb

## 2011-06-05 DIAGNOSIS — E785 Hyperlipidemia, unspecified: Secondary | ICD-10-CM

## 2011-06-05 DIAGNOSIS — Z0000 Encounter for general adult medical examination without abnormal findings: Secondary | ICD-10-CM

## 2011-06-05 DIAGNOSIS — Z23 Encounter for immunization: Secondary | ICD-10-CM

## 2011-06-05 DIAGNOSIS — J438 Other emphysema: Secondary | ICD-10-CM

## 2011-06-05 DIAGNOSIS — J439 Emphysema, unspecified: Secondary | ICD-10-CM

## 2011-06-05 DIAGNOSIS — M549 Dorsalgia, unspecified: Secondary | ICD-10-CM

## 2011-06-05 HISTORY — DX: Emphysema, unspecified: J43.9

## 2011-06-05 LAB — COMPREHENSIVE METABOLIC PANEL WITH GFR
ALT: 16 U/L (ref 0–35)
AST: 23 U/L (ref 0–37)
Albumin: 3.8 g/dL (ref 3.5–5.2)
Alkaline Phosphatase: 79 U/L (ref 39–117)
BUN: 17 mg/dL (ref 6–23)
CO2: 29 meq/L (ref 19–32)
Calcium: 9 mg/dL (ref 8.4–10.5)
Chloride: 105 meq/L (ref 96–112)
Creatinine, Ser: 0.7 mg/dL (ref 0.4–1.2)
GFR: 94.11 mL/min
Glucose, Bld: 87 mg/dL (ref 70–99)
Potassium: 4.2 meq/L (ref 3.5–5.1)
Sodium: 142 meq/L (ref 135–145)
Total Bilirubin: 0.5 mg/dL (ref 0.3–1.2)
Total Protein: 7 g/dL (ref 6.0–8.3)

## 2011-06-05 LAB — TSH: TSH: 1.55 u[IU]/mL (ref 0.35–5.50)

## 2011-06-05 LAB — HEPATIC FUNCTION PANEL
ALT: 16 U/L (ref 0–35)
AST: 23 U/L (ref 0–37)
Albumin: 3.8 g/dL (ref 3.5–5.2)
Alkaline Phosphatase: 79 U/L (ref 39–117)
Bilirubin, Direct: 0 mg/dL (ref 0.0–0.3)
Total Bilirubin: 0.5 mg/dL (ref 0.3–1.2)
Total Protein: 7 g/dL (ref 6.0–8.3)

## 2011-06-05 LAB — LIPID PANEL
Cholesterol: 235 mg/dL — ABNORMAL HIGH (ref 0–200)
HDL: 62.6 mg/dL (ref >39.00)
Total CHOL/HDL Ratio: 4
Triglycerides: 84 mg/dL (ref 0.0–149.0)
VLDL: 16.8 mg/dL (ref 0.0–40.0)

## 2011-06-05 LAB — LDL CHOLESTEROL, DIRECT: Direct LDL: 162.6 mg/dL

## 2011-06-05 NOTE — Progress Notes (Signed)
Subjective:    Patient ID: Megan Peterson, female    DOB: 01-27-1940, 71 y.o.   MRN: [id number]  HPI The patient is here for annual Medicare wellness examination and management of other chronic and acute problems. Has had retinal detachment with surgical repair Aug '12; wrinkle same retina April '13 otherwise has been doing well.   The risk factors are reflected in the social history.  The roster of all physicians providing medical care to patient - is listed in the Snapshot section of the chart.  Activities of daily living:  The patient is 100% inedpendent in all ADLs: dressing, toileting, feeding as well as independent mobility  Home safety : The patient has smoke detectors in the home. They wear seatbelts.  firearms are present in the home, kept in a safe fashion. There is no violence in the home.   There is no risks for hepatitis, STDs or HIV. There is a history of blood transfusion. They have no travel history to infectious disease endemic areas of the world.  The patient has not seen their dentist in the last six month. They have seen their eye doctor in the last year. They deny any hearing difficulty and have not had audiologic testing in the last year.  They do not  have excessive sun exposure. Discussed the need for sun protection: hats, long sleeves and use of sunscreen if there is significant sun exposure.   Diet: the importance of a healthy diet is discussed. They do have a healthy diet.  The patient has no regular exercise program.  The benefits of regular aerobic exercise were discussed.  Depression screen: there are no signs or vegative symptoms of depression- irritability, change in appetite, anhedonia, sadness/tearfullness.  Cognitive assessment: the patient manages all their financial and personal affairs and is actively engaged.  The following portions of the patient's history were reviewed and updated as appropriate: allergies, current medications, past family history,  past medical history,  past surgical history, past social history  and problem list.  Vision, hearing, body mass index were assessed and reviewed.   During the course of the visit the patient was educated and counseled about appropriate screening and preventive services including : fall prevention , diabetes screening, nutrition counseling, colorectal cancer screening, and recommended immunizations.  Past Medical History  Diagnosis Date  . Back pain, chronic   . Hyperlipidemia   . Dislocated shoulder april '12  . 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 Terri Piedra.   . Detached retina, left Aug '12    retinal wrinkle same eye April '13   Past Surgical History  Procedure Date  . Bladder tack   . Hysterectomy     history of  . Lumbar disc surgery     L4-5 diskectomy. Dr Ophelia Charter.    Family History  Problem Relation Age of Onset  . Coronary artery disease Mother   . Heart disease Mother   . Coronary artery disease      grandmother  . Heart attack      grandmother  . COPD Father   . Cancer Maternal Aunt     breast cancer  . Heart disease Maternal Grandfather    History   Social History  . Marital Status: Divorced    Spouse Name: N/A    Number of Children: 2  . Years of Education: 10   Occupational History  . RETIRED    Social History Main Topics  . Smoking status:  Former Smoker    Quit date: 01/03/1991  . Smokeless tobacco: Never Used  . Alcohol Use: No  . Drug Use: No  . Sexually Active: Not Currently   Other Topics Concern  . Not on file   Social History Narrative   10th grade. Married '58 - 61yrs - seperated. Married 96- 2 years/divorced (had been together for 29 years). 1 son '65; 1 dtr '60; 4 grandchildren and step-grandchildren; 4 great-grandchildren.. Work - Hospital doctor - retired Tree surgeon.        Review of Systems Constitutional:  Negative for fever, chills, activity change and unexpected weight change.  HEENT:   Negative for hearing loss, ear pain, congestion, neck stiffness and postnasal drip. Negative for sore throat or swallowing problems. Negative for dental complaints.   Eyes: Negative for vision loss or change in visual acuity.  Respiratory: Negative for chest tightness and wheezing. Negative for DOE.   Cardiovascular: Negative for chest pain or palpitations. No decreased exercise tolerance Gastrointestinal: No change in bowel habit. No bloating or gas. No reflux or indigestion Genitourinary: Negative for urgency, frequency, flank pain and difficulty urinating.  Musculoskeletal: Negative for myalgias, back pain, arthralgias and gait problem.  Neurological: Negative for dizziness, tremors, weakness and headaches.  Hematological: Negative for adenopathy.  Psychiatric/Behavioral: Negative for behavioral problems and dysphoric mood.       Objective:   Physical Exam Filed Vitals:   06/05/11 1033  BP: 114/72  Pulse: 87  Temp: 97.2 F (36.2 C)  Resp: 16   Wt Readings from Last 3 Encounters:  06/05/11 124 lb (56.246 kg)  06/27/10 130 lb 8 oz (59.194 kg)  05/11/09 129 lb (58.514 kg)    Gen'l: well nourished, well developed white woman in no distress HEENT - [place]/AT, EACs/TMs normal, oropharynx with native dentition in good condition, no buccal or palatal lesions, posterior pharynx clear, mucous membranes moist. C&S clear, PERRLA, fundi - normal. OS decreased accomodation to light Neck - supple, no thyromegaly Nodes- negative submental, cervical, supraclavicular regions Chest - no deformity, no CVAT Lungs - cleat without rales, wheezes. No increased work of breathing Breast - deferred to recent mammogram Cardiovascular - regular rate and rhythm, quiet precordium, no murmurs, rubs or gallops, 2+ radial, DP and PT pulses Abdomen - BS+ x 4, no guarding or rebound or tenderness. Possible hepatomegaly based on percussion Pelvic - deferred to gyn Rectal - deferred to gyn Extremities - no  clubbing, cyanosis, edema or deformity.  Neuro - A&O x 3, CN II-XII normal, motor strength normal and equal, DTRs 2+ and symmetrical biceps, radial, and patellar tendons. Cerebellar - no tremor, no rigidity, fluid movement and normal gait. Derm - Head, neck, back, abdomen and extremities without suspicious lesions.  Definite over-exposure to sun.  Lab Results  Component Value Date   WBC 13.4* 08/05/2010   HGB 14.3 08/05/2010   HCT 40.7 08/05/2010   PLT 271 08/05/2010   GLUCOSE 87 06/05/2011   CHOL 235* 06/05/2011   TRIG 84.0 06/05/2011   HDL 62.60 06/05/2011   LDLDIRECT 162.6 06/05/2011   LDLCALC 88 06/04/2006        ALT 16 06/05/2011   AST 23 06/05/2011        NA 142 06/05/2011   K 4.2 06/05/2011   CL 105 06/05/2011   CREATININE 0.7 06/05/2011   BUN 17 06/05/2011   CO2 29 06/05/2011   TSH 1.55 06/05/2011           Assessment & Plan:

## 2011-06-06 ENCOUNTER — Telehealth: Payer: Self-pay

## 2011-06-06 ENCOUNTER — Encounter: Payer: Self-pay | Admitting: Internal Medicine

## 2011-06-06 DIAGNOSIS — Z0000 Encounter for general adult medical examination without abnormal findings: Secondary | ICD-10-CM | POA: Insufficient documentation

## 2011-06-06 MED ORDER — TETANUS-DIPHTHERIA TOXOIDS TD 2-2 LF/0.5ML IM SUSP: 0.5000 mL | Freq: Once | INTRAMUSCULAR | Status: AC

## 2011-06-06 MED ORDER — LOVASTATIN 40 MG PO TABS: 40.0000 mg | ORAL_TABLET | Freq: Every day | ORAL | Status: DC

## 2011-06-06 NOTE — Telephone Encounter (Signed)
Ok for refills: hydrocodone/APAP 5/325 #120 w/ 2 refills; alprazolam #100 w/ 5 refills

## 2011-06-06 NOTE — Assessment & Plan Note (Signed)
Stable on present pain regimen. Able to do all her needed daily activities.

## 2011-06-06 NOTE — Telephone Encounter (Signed)
Pt called requesting refills of hydrocodone 5-325 q6h and alprazolam 0.5 mg TID. Pt says that she forgot to request refills at her appt yesterday.

## 2011-06-06 NOTE — Assessment & Plan Note (Signed)
Not taking any medication at this time. LDL is above the treatment threshold of 160 or greater.  Plan -  recommend lovastatin 40 mg once a day  Follow- up lab in 4 weeks

## 2011-06-06 NOTE — Assessment & Plan Note (Signed)
Interval history remarkable for detached retina successfully treated. Physical exam is normal. Lab results are in normal range except for LDL cholesterol. She is current with breast cancer screening. Will check old records re: colon cancer screening. Immunizations brought up to date except she decline Tetanus.  In summary - a very nice woman who appears medically and socially stable. She is advised to develop a regular exercise program. She will return as needed and will return for follow-up lab monitoring of lipids.

## 2011-06-06 NOTE — Assessment & Plan Note (Addendum)
Reviewed x-ray report and image with patient and explained the mechanism of disease, the natural history and the value of regular exercise. Patient does not smoke now.  Plan No treatment at this time - asymptomatic.  (greater than 50% of visit spent on education and counseling)

## 2011-06-06 NOTE — Telephone Encounter (Signed)
rx called in and pt notified. 

## 2011-06-07 NOTE — Progress Notes (Signed)
Addended by: Elnora Morrison on: 06/07/2011 02:39 PM   Modules accepted: Orders

## 2011-06-12 ENCOUNTER — Ambulatory Visit (INDEPENDENT_AMBULATORY_CARE_PROVIDER_SITE_OTHER): Payer: Medicare Other | Admitting: Internal Medicine

## 2011-06-12 DIAGNOSIS — J439 Emphysema, unspecified: Secondary | ICD-10-CM

## 2011-06-12 DIAGNOSIS — J438 Other emphysema: Secondary | ICD-10-CM

## 2011-06-12 LAB — PULMONARY FUNCTION TEST

## 2011-06-12 NOTE — Progress Notes (Signed)
PFT done today. 

## 2011-06-19 ENCOUNTER — Encounter: Payer: Self-pay | Admitting: Internal Medicine

## 2011-06-27 ENCOUNTER — Encounter: Payer: Medicare Other | Admitting: Internal Medicine

## 2011-07-28 ENCOUNTER — Other Ambulatory Visit: Payer: Self-pay | Admitting: Internal Medicine

## 2011-08-25 ENCOUNTER — Telehealth: Payer: Self-pay

## 2011-08-25 NOTE — Telephone Encounter (Signed)
Pt called requesting clarification on emphysema diagnosis. Per pt, insurance company, paperwork was received stating that pt is a "severe respiratory risk" and she is unsure of the reason. Does she has severe emphysema? Please advise.

## 2011-08-28 NOTE — Telephone Encounter (Signed)
Left message on machine for pt to return my call  

## 2011-08-28 NOTE — Telephone Encounter (Signed)
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 Recommend OV to discuss

## 2011-08-31 NOTE — Telephone Encounter (Signed)
Pt advised and transferred to schedule F/U

## 2011-09-06 ENCOUNTER — Encounter: Payer: Self-pay | Admitting: Internal Medicine

## 2011-09-06 ENCOUNTER — Ambulatory Visit (INDEPENDENT_AMBULATORY_CARE_PROVIDER_SITE_OTHER): Payer: Medicare Other | Admitting: Internal Medicine

## 2011-09-06 VITALS — BP 104/60 | HR 99 | Temp 97.3°F | Resp 16 | Wt 122.0 lb

## 2011-09-06 DIAGNOSIS — Z2911 Encounter for prophylactic immunotherapy for respiratory syncytial virus (RSV): Secondary | ICD-10-CM

## 2011-09-06 DIAGNOSIS — Z23 Encounter for immunization: Secondary | ICD-10-CM

## 2011-09-06 DIAGNOSIS — J438 Other emphysema: Secondary | ICD-10-CM

## 2011-09-06 DIAGNOSIS — J439 Emphysema, unspecified: Secondary | ICD-10-CM

## 2011-09-06 MED ORDER — ZOSTER VACCINE LIVE 19400 UNT/0.65ML ~~LOC~~ SOLR: 0.6500 mL | Freq: Once | SUBCUTANEOUS | Status: AC

## 2011-09-06 NOTE — Patient Instructions (Addendum)
Evaluation of lung function: x-ray imaging suggested loss of lung density consistent with emphysema. The pulmonary function study revealed severe obstructive lung disease with 81% of normal function in regard to the large airways and 9% of normal for the medium and small airways! There was a significant improvement, about 13-20%, with the use of a bronchodilator. The diffusion capacity - the ability to exchange carbon dioxide and oxygen was 64% of normal which is consistent to moderate emphysema.  If you are able to do all the things you want to do without being limited by shortness of breath we do not need to start any chronic therapy. However, you are at high risk for developing breathing difficulty and at that time we can certainly start medications - inhalers.    Chronic Obstructive Pulmonary Disease Chronic obstructive pulmonary disease (COPD) is a condition in which airflow from the lungs is restricted. The lungs can never return to normal, but there are measures you can take which will improve them and make you feel better. CAUSES    Smoking.   Exposure to secondhand smoke.   Breathing in irritants (pollution, cigarette smoke, strong smells, aerosol sprays, paint fumes).   History of lung infections.  TREATMENT   Treatment focuses on making you comfortable (supportive care). Your caregiver may prescribe medications (inhaled or pills) to help improve your breathing. HOME CARE INSTRUCTIONS    If you smoke, stop smoking.   Avoid exposure to smoke, chemicals, and fumes that aggravate your breathing.   Take antibiotic medicines as directed by your caregiver.   Avoid medicines that dry up your system and slow down the elimination of secretions (antihistamines and cough syrups). This decreases respiratory capacity and may lead to infections.   Drink enough water and fluids to keep your urine clear or pale yellow. This loosens secretions.   Use humidifiers at home and at your bedside if  they do not make breathing difficult.   Receive all protective vaccines your caregiver suggests, especially pneumococcal and influenza.   Use home oxygen as suggested.   Stay active. Exercise and physical activity will help maintain your ability to do things you want to do.   Eat a healthy diet.  SEEK MEDICAL CARE IF:    You develop pus-like mucus (sputum).   Breathing is more labored or exercise becomes difficult to do.   You are running out of the medicine you take for your breathing.  SEEK IMMEDIATE MEDICAL CARE IF:    You have a rapid heart rate.   You have agitation, confusion, tremors, or are in a stupor (family members may need to observe this).   It becomes difficult to breathe.   You develop chest pain.   You have a fever.  MAKE SURE YOU:    Understand these instructions.   Will watch your condition.   Will get help right away if you are not doing well or get worse.  Document Released: 09/28/2004 Document Revised: 12/08/2010 Document Reviewed: 02/18/2010 [facility] Patient Information 2012 [place], [place].  Chronic Obstructive Pulmonary Disease Chronic obstructive pulmonary disease (COPD) is a lung disease. The lungs become damaged, making it hard to get air in and out of your lungs. The damage to your lungs cannot be changed.   HOME CARE  Stop smoking if you smoke. Avoid secondhand smoke.   Only take medicine as told by your doctor.   Talk to your doctor about using cough syrup or over-the-counter medicines.   Drink enough fluids to keep your pee (  urine) clear or pale yellow.   Use a humidifier or vaporizer. This may help loosen the thick spit (mucus).   Talk to your doctor about vaccines that help prevent other lung problems (pneumonia and flu vaccines).   Use home oxygen as told by your doctor.   Stay active and exercise.   Eat healthy foods.  GET HELP RIGHT AWAY IF:    Your heart is beating fast.   You become disturbed, confused, shake, or are  dazed.   You have trouble breathing.   You have chest pain.   You have a fever.   You cough up thick spit that is yellowish-white or green.   Your breathing becomes worse when you exercise.   You are running out of the medicine you take for your breathing.  MAKE SURE YOU:    Understand these instructions.   Will watch your condition.   Will get help right away if you are not doing well or get worse.  Document Released: 06/07/2007 Document Revised: 12/08/2010 Document Reviewed: 02/18/2010 [facility] Patient Information 2012 [facility] [place], [place].

## 2011-09-09 NOTE — Assessment & Plan Note (Signed)
Reviewed images with patient. Reviewed PFT report. She is skeptical that she has any problem. She denies any SOB/DOE or limitation in activities.  Plan Patient informed as to mechanism of disease  No medical treatment at this time since she is asymptomatic.  (greater than 50% of 30 min  visit spent on education and counseling)

## 2011-09-09 NOTE — Progress Notes (Signed)
  Subjective:    Patient ID: Megan Peterson, female    DOB: Jan 17, 1940, 71 y.o.   MRN: [id number]  HPI Mrs. [place] presents to discuss recent PFTs that did reveal decreased diffusion capacity c/w  Emphysema.   PMH, FamHx and SocHx reviewed for any changes and relevance.  Current Outpatient Prescriptions on File Prior to Visit  Medication Sig Dispense Refill  . ALPRAZolam (XANAX) 0.5 MG tablet Take 1 tablet (0.5 mg total) by mouth 3 (three) times daily as needed.  90 tablet  5  . diphenhydrAMINE (BENADRYL) 25 mg capsule Take 25 mg by mouth as needed.        . gabapentin (NEURONTIN) 800 MG tablet TAKE ONE TABLET BY MOUTH THREE TIMES DAILY  90 tablet  11  . HYDROcodone-acetaminophen (NORCO) 5-325 MG per tablet Take 1 tablet by mouth every 6 (six) hours as needed.  60 tablet  3  . ibuprofen (ADVIL,MOTRIN) 600 MG tablet Take 600 mg by mouth as needed.        . diptheria-tetanus toxoids (DECAVAC) 2-2 LF/0.5ML injection Inject 0.5 mLs into the muscle once.  0.5 mL  0  . lovastatin (MEVACOR) 40 MG tablet Take 1 tablet (40 mg total) by mouth at bedtime.  30 tablet  11      Review of Systems System review is negative for any constitutional, cardiac, pulmonary, GI or neuro symptoms or complaints other than as described in the HPI.     Objective:   Physical Exam Filed Vitals:   09/06/11 1451  BP: 104/60  Pulse: 99  Temp: 97.3 F (36.3 C)  Resp: 16   Gen'l- WNWD white womanlooking younger than her stated age. HEENT- C&S clear Cor- RRR Pulm - normal respirations w/o increased WOB, no wheezing.       Assessment & Plan:

## 2011-09-11 ENCOUNTER — Ambulatory Visit: Payer: Medicare Other | Admitting: Internal Medicine

## 2011-09-13 ENCOUNTER — Ambulatory Visit: Payer: Medicare Other | Admitting: Internal Medicine

## 2011-12-01 ENCOUNTER — Other Ambulatory Visit: Payer: Self-pay | Admitting: *Deleted

## 2011-12-01 MED ORDER — HYDROCODONE-ACETAMINOPHEN 5-325 MG PO TABS: 1.0000 | ORAL_TABLET | Freq: Four times a day (QID) | ORAL | Status: DC | PRN

## 2011-12-13 ENCOUNTER — Other Ambulatory Visit: Payer: Self-pay | Admitting: *Deleted

## 2011-12-15 ENCOUNTER — Telehealth: Payer: Self-pay | Admitting: Internal Medicine

## 2011-12-15 NOTE — Telephone Encounter (Signed)
Patient calls regarding refill for Hydrocodone 5-325.  Last refill was 11/29 for 60 to be taken every 6 hours as needed for pain.  Patient has 10 left.  States that pharmacy has sent several faxed requests for refill.  Spoke to someone in the office who said since Dr. Debby Bud was not in would give to another provider.  Last visit was 09/06/11 with Dr. Debby Bud.  Uses [facility] at [phone].

## 2011-12-18 NOTE — Telephone Encounter (Signed)
Chronic med for her. OK to refill  With 2 refills. Will need OV before any additional refills.

## 2011-12-19 ENCOUNTER — Other Ambulatory Visit: Payer: Self-pay | Admitting: *Deleted

## 2011-12-19 MED ORDER — HYDROCODONE-ACETAMINOPHEN 5-325 MG PO TABS: ORAL_TABLET | ORAL | Status: DC

## 2011-12-28 ENCOUNTER — Other Ambulatory Visit: Payer: Self-pay | Admitting: *Deleted

## 2011-12-28 MED ORDER — ALPRAZOLAM 0.5 MG PO TABS: 0.5000 mg | ORAL_TABLET | Freq: Three times a day (TID) | ORAL | Status: DC | PRN

## 2012-01-04 ENCOUNTER — Telehealth: Payer: Self-pay | Admitting: *Deleted

## 2012-01-04 NOTE — Telephone Encounter (Signed)
Left msg on triage requesting status on her alprazolam. She states she has left several msg but no call back. Called pt per chart med was call into pharmacy on 12/28/11. Notified [facility] spoke with Victorino Dike she states they didn't received . Gave order verbally....Raechel Chute

## 2012-03-19 ENCOUNTER — Ambulatory Visit (INDEPENDENT_AMBULATORY_CARE_PROVIDER_SITE_OTHER): Payer: Medicare Other | Admitting: Internal Medicine

## 2012-03-19 ENCOUNTER — Encounter: Payer: Self-pay | Admitting: Internal Medicine

## 2012-03-19 VITALS — BP 138/80 | HR 82 | Temp 97.8°F | Resp 12 | Ht 59.0 in | Wt 126.4 lb

## 2012-03-19 DIAGNOSIS — Z0000 Encounter for general adult medical examination without abnormal findings: Secondary | ICD-10-CM

## 2012-03-19 DIAGNOSIS — M549 Dorsalgia, unspecified: Secondary | ICD-10-CM

## 2012-03-19 DIAGNOSIS — E785 Hyperlipidemia, unspecified: Secondary | ICD-10-CM

## 2012-03-19 MED ORDER — GEMFIBROZIL 600 MG PO TABS: 600.0000 mg | ORAL_TABLET | Freq: Two times a day (BID) | ORAL | Status: DC

## 2012-03-19 MED ORDER — HYDROCODONE-ACETAMINOPHEN 5-325 MG PO TABS: ORAL_TABLET | ORAL | Status: DC

## 2012-03-19 MED ORDER — GABAPENTIN 800 MG PO TABS: ORAL_TABLET | ORAL | Status: DC

## 2012-03-19 MED ORDER — ALPRAZOLAM 0.5 MG PO TABS: 0.5000 mg | ORAL_TABLET | Freq: Three times a day (TID) | ORAL | Status: DC | PRN

## 2012-03-19 NOTE — Assessment & Plan Note (Addendum)
Assessment: pt continues to have chronic pain which is helped by narcotic therapy, she has adhered to rules of pain therapy (refill every three months, on provider/pharmacy, etc.) Plan: - refill hydrocodone-acetaminophin - refill gabapentin - refill alprazolam - official pain contract discussed and signed by pt and MD - pt is changing pharmacies this is noted in the record

## 2012-03-19 NOTE — Patient Instructions (Addendum)
1. Pain management - in reviewing records I cannot find a controlled substance contract on file, so we will do that today. We will refill meds as usual  2. Cholesterol - your LDL is above the threshold for treatment, LDL of 160 or above. Plan - lopid 600 mg twice a day: up titration - 1/2 pilld daily x 3, 1/2 pill AM and PM x 3 days, 1 pill AM and 1/2 pill PM x 3 days and then 1 pill twice a day  Return for lab work in 8 weeks (an order is in the system - just come by the lab)  3. Health maintenance - will pull your record to check on the last colonoscopy.

## 2012-03-19 NOTE — Progress Notes (Signed)
Subjective:     Patient ID: Megan Peterson, female   DOB: [birth year], 72 y.o.   MRN: [id number]  HPI Megan Peterson is a 72 yo woman that presents today for a refill of her narcotics (for chronic back pain).  Upon questioning she admits to not taking her statin for some time due to muscle aches, she reports being tried on multiple other statin drugs.  She also admits to a fall approximately two months ago which she contributes to pets tripping her, fortunately she did not sustain any significant injury from the fall.  She complains today of pain down the lateral aspect of her legs which is well controlled with her current pain regimen.  Past Medical History  Diagnosis Date  . Back pain, chronic   . Hyperlipidemia   . Dislocated shoulder april '12  . 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 Terri Piedra.   . Detached retina, left Aug '12    retinal wrinkle same eye April '13   Past Surgical History  Procedure Laterality Date  . Bladder tack    . Hysterectomy      history of  . Lumbar disc surgery      L4-5 diskectomy. Dr Ophelia Charter.    Family History  Problem Relation Age of Onset  . Coronary artery disease Mother   . Heart disease Mother   . Coronary artery disease      grandmother  . Heart attack      grandmother  . COPD Father   . Cancer Maternal Aunt     breast cancer  . Heart disease Maternal Grandfather    History   Social History  . Marital Status: Divorced    Spouse Name: N/A    Number of Children: 2  . Years of Education: 10   Occupational History  . RETIRED    Social History Main Topics  . Smoking status: Former Smoker    Quit date: 01/03/1991  . Smokeless tobacco: Never Used  . Alcohol Use: No  . Drug Use: No  . Sexually Active: Not Currently   Other Topics Concern  . Not on file   Social History Narrative   10th grade. Married '58 - 35yrs - seperated. Married 96- 2 years/divorced (had been together for 29 years). 1 son '65; 1 dtr '60; 4  grandchildren and step-grandchildren; 4 great-grandchildren.. Work - Hospital doctor - retired Tree surgeon.             Current Outpatient Prescriptions on File Prior to Visit  Medication Sig Dispense Refill  . ALPRAZolam (XANAX) 0.5 MG tablet Take 1 tablet (0.5 mg total) by mouth 3 (three) times daily as needed.  90 tablet  5  . diphenhydrAMINE (BENADRYL) 25 mg capsule Take 25 mg by mouth as needed.        . diptheria-tetanus toxoids (DECAVAC) 2-2 LF/0.5ML injection Inject 0.5 mLs into the muscle once.  0.5 mL  0  . gabapentin (NEURONTIN) 800 MG tablet TAKE ONE TABLET BY MOUTH THREE TIMES DAILY  90 tablet  11  . ibuprofen (ADVIL,MOTRIN) 600 MG tablet Take 600 mg by mouth as needed.         No current facility-administered medications on file prior to visit.   Review of Systems  Respiratory: Negative for shortness of breath.        Denies dyspnea on exertion  Cardiovascular: Negative for chest pain.  Gastrointestinal: Negative for abdominal pain and constipation.  Neurological:  Pain in lateral legs well controlled with current meds       Objective:   Physical Exam Filed Vitals:   03/19/12 0918  BP: 138/80  Pulse: 82  Temp: 97.8 F (36.6 C)  Resp: 12  Pulse Ox - 93%  General: elderly female resting comfortably in chair in NAD HENT: [place]/AT, EOMI CV: RRR, no m/r/g, no lower extremity edema Pulm: nl work of breathing, decreased breath sounds in all lung fields Abd: soft, non-tender, non-distended, +BS in all four quadrants Extremities: 2+ radial pulses MSK: unassisted sit to stand 5 times in 11 seconds, timed get up and go 11.8 seconds Neuro: no focal deficits; mini-cog - 3/3 immediate recall, 2/3 delayed recall, clock draw (asked to draw the time 2:35) had hands in correct position pt had difficulty filling in numbers (wrote "2" and "35" at the correct hands but no other numbers on clock face) Psych: mood and affect appropriate for interaction      AssessmentPlan:   Megan Peterson personally interviewed, reviewed meds, discussed and executed Controlled Substance Contract, discussed physical exam and I agree with the assessment and plan as documented by Mr. Lucretia Roers, MS III

## 2012-03-19 NOTE — Assessment & Plan Note (Signed)
Assessment: pt has not been taking prescribed statin for almost a year secondary to side effects.  Last cholesterol was high would expect lipids to be as high or higher today, she still needs treatment. Plan: - prescribed gemfibrozil, will titrate up to 2 pills daily to avoid GI side effects - pt will follow up in 2 months for recheck of lipid panel and metabolic panel

## 2012-03-19 NOTE — Assessment & Plan Note (Signed)
Assessment: vaccinations are up to date, last mammogram 2011, last colonoscopy not in EMR Plan: - pull paper file for colonoscopy - pt will schedule a mammogram

## 2012-04-01 ENCOUNTER — Telehealth: Payer: Self-pay | Admitting: Internal Medicine

## 2012-04-01 NOTE — Telephone Encounter (Signed)
Patient Information:  Caller Name: [place]  Phone: [phone]  Patient: Megan Peterson, Megan Peterson  Gender: Female  DOB: 06-23-1940  Age: 72 Years  PCP: Illene Regulus (Adults only)  Office Follow Up:  Does the office need to follow up with this patient?: No  Instructions For The Office: N/A   Symptoms  Reason For Call & Symptoms: sore throat; dry cough; patient also reports swollen glands  Reviewed Health History In EMR: Yes  Reviewed Medications In EMR: Yes  Reviewed Allergies In EMR: Yes  Reviewed Surgeries / Procedures: Yes  Date of Onset of Symptoms: 03/31/2012  Treatments Tried: Ibuprfon, Listerine, salt water gargles  Treatments Tried Worked: No   Fever:  NO  Guideline(s) Used:  Sore Throat  Disposition Per Guideline:   Home Care  Reason For Disposition Reached:   Sore throat  Advice Given:  For Relief of Sore Throat Pain:  Sip warm chicken broth or apple juice.  Suck on hard candy or a throat lozenge (over-the-counter).  Gargle warm salt water 3 times daily (1 teaspoon of salt in 8 oz or 240 ml of warm water).  Pain Medicines:  For pain relief, you can take either acetaminophen, ibuprofen, or naproxen.  Soft Diet:   Cold drinks and milk shakes are especially good (Reason: swollen tonsils can make some foods hard to swallow).  Liquids:  Adequate liquid intake is important to prevent dehydration. Drink 6-8 glasses of water per day.  Expected Course:  Sore throats with viral illnesses usually last 3 or 4 days.  Contagiousness:   You can return to work or school after the fever is gone and you feel well enough to participate in normal activities. If your doctor determines that you have Strep throat, then you will need to take an antibiotic for 24 hours before you can return.  Call Back If:  Sore throat is the main symptom and it lasts longer than 24 hours  You become worse.  Patient Will Follow Care Advice:  YES

## 2012-05-06 ENCOUNTER — Encounter: Payer: Self-pay | Admitting: Internal Medicine

## 2012-05-09 ENCOUNTER — Encounter: Payer: Self-pay | Admitting: Internal Medicine

## 2012-05-20 ENCOUNTER — Other Ambulatory Visit (INDEPENDENT_AMBULATORY_CARE_PROVIDER_SITE_OTHER): Payer: Medicare Other

## 2012-05-20 DIAGNOSIS — E785 Hyperlipidemia, unspecified: Secondary | ICD-10-CM

## 2012-05-20 LAB — COMPREHENSIVE METABOLIC PANEL
ALT: 11 U/L (ref 0–35)
AST: 20 U/L (ref 0–37)
Calcium: 9.4 mg/dL (ref 8.4–10.5)
Chloride: 104 mEq/L (ref 96–112)
Creatinine, Ser: 0.8 mg/dL (ref 0.4–1.2)
Sodium: 142 mEq/L (ref 135–145)
Total Protein: 7.2 g/dL (ref 6.0–8.3)

## 2012-05-20 LAB — LIPID PANEL
Cholesterol: 218 mg/dL — ABNORMAL HIGH (ref 0–200)
Total CHOL/HDL Ratio: 4
VLDL: 9.4 mg/dL (ref 0.0–40.0)

## 2012-05-29 ENCOUNTER — Encounter: Payer: Self-pay | Admitting: Internal Medicine

## 2012-05-29 DIAGNOSIS — E785 Hyperlipidemia, unspecified: Secondary | ICD-10-CM

## 2012-06-21 ENCOUNTER — Telehealth: Payer: Self-pay | Admitting: Internal Medicine

## 2012-06-21 DIAGNOSIS — E785 Hyperlipidemia, unspecified: Secondary | ICD-10-CM

## 2012-06-21 MED ORDER — HYDROCODONE-ACETAMINOPHEN 5-325 MG PO TABS: ORAL_TABLET | ORAL | Status: DC

## 2012-06-21 NOTE — Telephone Encounter (Signed)
rx called to [facility] pharmacy and pt has been notified this has been done. She states she was taking generic Lopid for her cholesterol and it made her feel terrible. She wanted you to be aware she no longer takes this and will just work on diet and exercise.

## 2012-06-21 NOTE — Telephone Encounter (Signed)
Patient would like to wait to pick up a rx for hydrocodone and would like to speak with nurse about another med.

## 2012-06-21 NOTE — Telephone Encounter (Signed)
Ok to refill 

## 2012-06-21 NOTE — Telephone Encounter (Signed)
Do you want to refill the Hydrocodone. She just walked in she is out front

## 2012-07-22 ENCOUNTER — Telehealth: Payer: Self-pay | Admitting: *Deleted

## 2012-07-22 DIAGNOSIS — E785 Hyperlipidemia, unspecified: Secondary | ICD-10-CM

## 2012-07-22 NOTE — Telephone Encounter (Signed)
OK for refill x 3

## 2012-07-22 NOTE — Telephone Encounter (Signed)
Refill request for hydrocodone 5-325mg  Last OV 3.18.14 Last filled 6.20.14

## 2012-07-23 MED ORDER — HYDROCODONE-ACETAMINOPHEN 5-325 MG PO TABS: ORAL_TABLET | ORAL | Status: DC

## 2012-07-23 NOTE — Telephone Encounter (Signed)
Refill phoned in

## 2012-09-23 ENCOUNTER — Other Ambulatory Visit: Payer: Self-pay

## 2012-09-23 MED ORDER — ALPRAZOLAM 0.5 MG PO TABS: 0.5000 mg | ORAL_TABLET | Freq: Three times a day (TID) | ORAL | Status: DC | PRN

## 2012-10-18 ENCOUNTER — Telehealth: Payer: Self-pay | Admitting: Internal Medicine

## 2012-10-18 DIAGNOSIS — E785 Hyperlipidemia, unspecified: Secondary | ICD-10-CM

## 2012-10-18 MED ORDER — HYDROCODONE-ACETAMINOPHEN 5-325 MG PO TABS: ORAL_TABLET | ORAL | Status: DC

## 2012-10-18 NOTE — Telephone Encounter (Signed)
Request refill on HYDROcodone-acetaminophen

## 2012-10-18 NOTE — Telephone Encounter (Signed)
rx printed waiting for signature 

## 2012-10-18 NOTE — Telephone Encounter (Signed)
Patient has been given her script

## 2012-11-13 ENCOUNTER — Telehealth: Payer: Self-pay | Admitting: *Deleted

## 2012-11-13 DIAGNOSIS — E785 Hyperlipidemia, unspecified: Secondary | ICD-10-CM

## 2012-11-13 MED ORDER — HYDROCODONE-ACETAMINOPHEN 5-325 MG PO TABS: ORAL_TABLET | ORAL | Status: DC

## 2012-11-13 NOTE — Telephone Encounter (Signed)
Spoke with pt advised Rx written waiting signature.

## 2012-11-13 NOTE — Telephone Encounter (Signed)
Ok for 30 day supply with 3 scripts

## 2012-11-13 NOTE — Telephone Encounter (Signed)
Pt called requesting Hydrocodone refill.  Please advise 

## 2013-03-11 ENCOUNTER — Encounter: Payer: Self-pay | Admitting: Internal Medicine

## 2013-03-11 ENCOUNTER — Telehealth: Payer: Self-pay

## 2013-03-11 ENCOUNTER — Ambulatory Visit (INDEPENDENT_AMBULATORY_CARE_PROVIDER_SITE_OTHER): Payer: Medicare Other | Admitting: Internal Medicine

## 2013-03-11 ENCOUNTER — Other Ambulatory Visit (INDEPENDENT_AMBULATORY_CARE_PROVIDER_SITE_OTHER): Payer: Medicare Other

## 2013-03-11 VITALS — BP 132/70 | HR 80 | Temp 97.3°F | Resp 14 | Ht 59.0 in | Wt 126.2 lb

## 2013-03-11 DIAGNOSIS — Z23 Encounter for immunization: Secondary | ICD-10-CM

## 2013-03-11 DIAGNOSIS — M549 Dorsalgia, unspecified: Secondary | ICD-10-CM

## 2013-03-11 DIAGNOSIS — E785 Hyperlipidemia, unspecified: Secondary | ICD-10-CM

## 2013-03-11 DIAGNOSIS — J438 Other emphysema: Secondary | ICD-10-CM

## 2013-03-11 DIAGNOSIS — J439 Emphysema, unspecified: Secondary | ICD-10-CM

## 2013-03-11 DIAGNOSIS — Z0000 Encounter for general adult medical examination without abnormal findings: Secondary | ICD-10-CM

## 2013-03-11 LAB — LIPID PANEL
Cholesterol: 241 mg/dL — ABNORMAL HIGH (ref 0–200)
HDL: 59.9 mg/dL (ref >39.00)
LDL CALC: 156 mg/dL — AB (ref 0–99)
Total CHOL/HDL Ratio: 4
Triglycerides: 128 mg/dL (ref 0.0–149.0)
VLDL: 25.6 mg/dL (ref 0.0–40.0)

## 2013-03-11 MED ORDER — HYDROCODONE-ACETAMINOPHEN 5-325 MG PO TABS: ORAL_TABLET | ORAL | Status: DC

## 2013-03-11 MED ORDER — ALPRAZOLAM 0.5 MG PO TABS: 0.5000 mg | ORAL_TABLET | Freq: Three times a day (TID) | ORAL | Status: DC | PRN

## 2013-03-11 MED ORDER — DIPHENHYDRAMINE HCL 25 MG PO CAPS: 25.0000 mg | ORAL_CAPSULE | ORAL | Status: DC | PRN

## 2013-03-11 MED ORDER — IBUPROFEN 600 MG PO TABS: 600.0000 mg | ORAL_TABLET | ORAL | Status: DC | PRN

## 2013-03-11 MED ORDER — GABAPENTIN 800 MG PO TABS: ORAL_TABLET | ORAL | Status: DC

## 2013-03-11 NOTE — Telephone Encounter (Signed)
Message copied by Shelly Coss on Tue Mar 11, 2013 10:34 AM ------      Message from: Neena Rhymes      Created: Tue Mar 11, 2013 10:28 AM       Need to locate old colonoscopy report - pull paper chart.  ------

## 2013-03-11 NOTE — Progress Notes (Signed)
Pre visit review using our clinic review tool, if applicable. No additional management support is needed unless otherwise documented below in the visit note. 

## 2013-03-11 NOTE — Telephone Encounter (Signed)
Request has been faxed to medical records

## 2013-03-11 NOTE — Progress Notes (Signed)
   Subjective:    Patient ID: Megan Peterson, female    DOB: 09-22-1940, 73 y.o.   MRN: [id number]  HPI Presents for follow up and renewal of chronic pain and anxiety meds. She has not had any change in bowel habit and no mental status changes. She is worried about continuing care, especially in regard to continued prescribing.  She has a recurrent pruritic rash on the anterior distal LE right.   She has otherwise been doing well. She is watching her diet and she is walking more. She is current with dental and eye care. She is current with mammography. She reports having had colonoscopy - no report in EPIC  Past Medical History  Diagnosis Date  . Back pain, chronic   . Hyperlipidemia   . Dislocated shoulder april '12  . 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 Allyson Sabal.   . Detached retina, left Aug '12    retinal wrinkle same eye April '13   Past Surgical History  Procedure Laterality Date  . Bladder tack    . Hysterectomy      history of  . Lumbar disc surgery      L4-5 diskectomy. Dr Lorin Mercy.    Family History  Problem Relation Age of Onset  . Coronary artery disease Mother   . Heart disease Mother   . Coronary artery disease      grandmother  . Heart attack      grandmother  . COPD Father   . Cancer Maternal Aunt     breast cancer  . Heart disease Maternal Grandfather    History   Social History  . Marital Status: Divorced    Spouse Name: N/A    Number of Children: 2  . Years of Education: 10   Occupational History  . RETIRED    Social History Main Topics  . Smoking status: Former Smoker    Quit date: 01/03/1991  . Smokeless tobacco: Never Used  . Alcohol Use: No  . Drug Use: No  . Sexual Activity: Not Currently   Other Topics Concern  . Not on file   Social History Narrative   10th grade. Married '58 - 80yrs - seperated. Married 96- 2 years/divorced (had been together for 29 years). 1 son '65; 1 dtr '60; 4 grandchildren and  step-grandchildren; 4 great-grandchildren.. 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              No current outpatient prescriptions on file prior to visit.   No current facility-administered medications on file prior to visit.     Review of Systems System review is negative for any constitutional, cardiac, pulmonary, GI or neuro symptoms or complaints other than as described in the HPI.     Objective:   Physical Exam Filed Vitals:   03/11/13 1002  BP: 132/70  Pulse: 80  Temp: 97.3 F (36.3 C)  Resp: 14   Wt Readings from Last 3 Encounters:  03/11/13 126 lb 4 oz (57.267 kg)  03/19/12 126 lb 6.4 oz (57.335 kg)  09/06/11 122 lb (55.339 kg)   Gen'l- WNWD woman in no distress HEENT_ C&S clear Cor 2+ radial, RRR Pulm - normal respirations Neuro - awake and alert, cognition normal, normal gait.       Assessment & Plan:

## 2013-03-11 NOTE — Patient Instructions (Signed)
Thank you for allowing me to help care for you over the years.  Rash - will try the clobetasol cream. If this doesn't help I recommend that you see Dr. Allyson Sabal  Lab today to check on your cholesterol  You are current with prevention except we need to locate your colonoscopy report. Immunizations are up to date except for Prevnar which we will give today.  Controlled substances - refills today; drug screening with [facility] today.  For continuing care will refer you to Dr. Leo Grosser at the [facility] office - you will need to see him in three months.

## 2013-03-13 NOTE — Assessment & Plan Note (Signed)
Chronic back pain for years controlled with hydrocodone qid.  Plan Continue present meds -   Follow office protocol - has been very reliable  Will need reevaluation and imaging.

## 2013-03-13 NOTE — Assessment & Plan Note (Addendum)
LDL is greater than goal but below treatment threshold of 160 (NCEP ATP III) and she prefers to not take medication having had adverse side effects with statins and fibrates. HDL is above goal and protective. She is low risk for ASVD  Plan Careful low fat diet along with regular exercise.

## 2013-03-13 NOTE — Assessment & Plan Note (Signed)
No respiratory complaints. She is not using any respiratory medications. She does have DOE but is not limited in her activities.

## 2013-03-14 ENCOUNTER — Encounter: Payer: Self-pay | Admitting: Internal Medicine

## 2013-03-21 ENCOUNTER — Telehealth: Payer: Self-pay

## 2013-03-21 MED ORDER — CLOBETASOL PROPIONATE 0.05 % EX CREA: 1.0000 "application " | TOPICAL_CREAM | Freq: Two times a day (BID) | CUTANEOUS | Status: DC

## 2013-03-21 NOTE — Telephone Encounter (Signed)
Phone call from patient [phone] states she was to have a cream for her leg rash. Per Dr Linda Hedges office note patient is to try clobetasol cream. She uses [facility].

## 2013-05-09 ENCOUNTER — Encounter: Payer: Self-pay | Admitting: Cardiology

## 2013-05-27 ENCOUNTER — Emergency Department (HOSPITAL_COMMUNITY)
Admission: EM | Admit: 2013-05-27 | Discharge: 2013-05-27 | Disposition: A | Discharge status: Home / Self Care | Payer: Medicare Other | Source: Home / Self Care | Attending: Family Medicine | Admitting: Family Medicine

## 2013-05-27 ENCOUNTER — Encounter (HOSPITAL_COMMUNITY): Payer: Self-pay | Admitting: Emergency Medicine

## 2013-05-27 DIAGNOSIS — L255 Unspecified contact dermatitis due to plants, except food: Secondary | ICD-10-CM

## 2013-05-27 DIAGNOSIS — L247 Irritant contact dermatitis due to plants, except food: Secondary | ICD-10-CM

## 2013-05-27 MED ORDER — PREDNISONE 20 MG PO TABS
60.0000 mg | ORAL_TABLET | Freq: Once | ORAL | Status: AC
  Administered 2013-05-27: 60 mg via ORAL

## 2013-05-27 MED ORDER — PREDNISONE 20 MG PO TABS
ORAL_TABLET | ORAL | Status: AC
  Filled 2013-05-27: qty 3

## 2013-05-27 MED ORDER — PREDNISONE 10 MG PO TABS: ORAL_TABLET | ORAL | Status: DC

## 2013-05-27 NOTE — ED Provider Notes (Signed)
CSN: [id number]     Arrival date & time 05/27/13  1317 History   First MD Initiated Contact with Patient 05/27/13 1442     Chief Complaint  Patient presents with  . Rash   (Consider location/radiation/quality/duration/timing/severity/associated sxs/prior Treatment) HPI Comments: 73 year old female presents for evaluation of a rash on her arms and legs. She was out working in her yard and she thinks she got into some poison ivy. The rash is extremely itchy. She has been putting calamine lotion and clobetasol cream without relief. She has had an identical rash in the past because of poison ivy, and her neighbor who was working in her yard with her also has poison ivy now. The rash is extremely itchy but not painful. No systemic symptoms.  Patient is a 73 y.o. female presenting with rash.  Rash   Past Medical History  Diagnosis Date  . Back pain, chronic   . Hyperlipidemia   . Dislocated shoulder april '12  . 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 Allyson Sabal.   . Detached retina, left Aug '12    retinal wrinkle same eye April '13   Past Surgical History  Procedure Laterality Date  . Bladder tack    . Hysterectomy      history of  . Lumbar disc surgery      L4-5 diskectomy. Dr Lorin Mercy.    Family History  Problem Relation Age of Onset  . Coronary artery disease Mother   . Heart disease Mother   . Coronary artery disease      grandmother  . Heart attack      grandmother  . COPD Father   . Cancer Maternal Aunt     breast cancer  . Heart disease Maternal Grandfather    History  Substance Use Topics  . Smoking status: Former Smoker    Quit date: 01/03/1991  . Smokeless tobacco: Never Used  . Alcohol Use: No   OB History   Grav Para Term Preterm Abortions TAB SAB Ect Mult Living                 Review of Systems  Skin: Positive for rash.  All other systems reviewed and are negative.   Allergies  Meperidine hcl and Morphine and related  Home  Medications   Prior to Admission medications   Medication Sig Start Date End Date Taking? Authorizing Provider  ALPRAZolam Duanne Moron) 0.5 MG tablet Take 1 tablet (0.5 mg total) by mouth 3 (three) times daily as needed. 03/11/13  Yes Neena Rhymes, MD  gabapentin (NEURONTIN) 800 MG tablet TAKE ONE TABLET BY MOUTH THREE TIMES DAILY 03/11/13  Yes Neena Rhymes, MD  HYDROcodone-acetaminophen (NORCO/VICODIN) 5-325 MG per tablet Take 1 tablet every 6 hours as needed for pain.  To be filled on or after 05/12/2013 03/11/13  Yes Neena Rhymes, MD  clobetasol cream (TEMOVATE) 2.67 % Apply 1 application topically 2 (two) times daily. 03/21/13   Neena Rhymes, MD  diphenhydrAMINE (BENADRYL) 25 mg capsule Take 1 capsule (25 mg total) by mouth as needed. 03/11/13   Neena Rhymes, MD  ibuprofen (ADVIL,MOTRIN) 600 MG tablet Take 1 tablet (600 mg total) by mouth as needed. 03/11/13   Neena Rhymes, MD  predniSONE (DELTASONE) 10 MG tablet 4 tabs PO QD for 4 days; 3 tabs PO QD for 3 days; 2 tabs PO QD for 2 days; 1 tab PO QD for 1 day 05/27/13   Alroy Dust  H Tyger Wichman, PA-C   BP 148/64  Pulse 75  Temp(Src) 98.2 F (36.8 C) (Oral)  Resp 16  SpO2 100% Physical Exam  Nursing note and vitals reviewed. Constitutional: She is oriented to person, place, and time. Vital signs are normal. She appears well-developed and well-nourished. No distress.  HENT:  Head: Normocephalic and atraumatic.  Pulmonary/Chest: Effort normal. No respiratory distress.  Neurological: She is alert and oriented to person, place, and time. She has normal strength. Coordination normal.  Skin: Skin is warm and dry. Rash noted. Rash is maculopapular (erythematous, maculopapular, vesicular rash in a linear distribution on the bilateral arms and legs) and vesicular. She is not diaphoretic.  Psychiatric: She has a normal mood and affect. Judgment normal.    ED Course  Procedures (including critical care time) Labs Review Labs Reviewed - No  data to display  Imaging Review No results found.   MDM   1. Plant irritant contact dermatitis    Continue clobetasol, will add oral prednisone. She was given 60 mg of prednisone here and prednisone to continue to take at home. Followup if no improvement in a few days.    Liam Graham, PA-C 05/27/13 1546

## 2013-05-27 NOTE — ED Notes (Signed)
C/o rash on arms and legs.  States last week working in yard.  Pt has tried calamine lotion with no relief of irritation.

## 2013-05-27 NOTE — ED Provider Notes (Signed)
Medical screening examination/treatment/procedure(s) were performed by resident physician or non-physician practitioner and as supervising physician I was immediately available for consultation/collaboration.   Jemila Camille DOUGLAS MD.   Brenan Modesto D Janiylah Hannis, MD 05/27/13 1643 

## 2013-05-27 NOTE — Discharge Instructions (Signed)
Poison Ivy Poison ivy is a inflammation of the skin (contact dermatitis) caused by touching the allergens on the leaves of the ivy plant following previous exposure to the plant. The rash usually appears 48 hours after exposure. The rash is usually bumps (papules) or blisters (vesicles) in a linear pattern. Depending on your own sensitivity, the rash may simply cause redness and itching, or it may also progress to blisters which may break open. These must be well cared for to prevent secondary bacterial (germ) infection, followed by scarring. Keep any open areas dry, clean, dressed, and covered with an antibacterial ointment if needed. The eyes may also get puffy. The puffiness is worst in the morning and gets better as the day progresses. This dermatitis usually heals without scarring, within 2 to 3 weeks without treatment. HOME CARE INSTRUCTIONS  Thoroughly wash with soap and water as soon as you have been exposed to poison ivy. You have about one half hour to remove the plant resin before it will cause the rash. This washing will destroy the oil or antigen on the skin that is causing, or will cause, the rash. Be sure to wash under your fingernails as any plant resin there will continue to spread the rash. Do not rub skin vigorously when washing affected area. Poison ivy cannot spread if no oil from the plant remains on your body. A rash that has progressed to weeping sores will not spread the rash unless you have not washed thoroughly. It is also important to wash any clothes you have been wearing as these may carry active allergens. The rash will return if you wear the unwashed clothing, even several days later. Avoidance of the plant in the future is the best measure. Poison ivy plant can be recognized by the number of leaves. Generally, poison ivy has three leaves with flowering branches on a single stem. Diphenhydramine may be purchased over the counter and used as needed for itching. Do not drive with  this medication if it makes you drowsy.Ask your caregiver about medication for children. SEEK MEDICAL CARE IF:  Open sores develop.  Redness spreads beyond area of rash.  You notice purulent (pus-like) discharge.  You have increased pain.  Other signs of infection develop (such as fever). Document Released: 12/17/1999 Document Revised: 03/13/2011 Document Reviewed: 11/04/2008 Exit[employer] Patient Information 2014 [employer].  

## 2013-05-29 ENCOUNTER — Encounter (HOSPITAL_COMMUNITY): Payer: Self-pay | Admitting: Emergency Medicine

## 2013-05-29 ENCOUNTER — Telehealth: Payer: Self-pay

## 2013-05-29 ENCOUNTER — Emergency Department (INDEPENDENT_AMBULATORY_CARE_PROVIDER_SITE_OTHER)
Admission: EM | Admit: 2013-05-29 | Discharge: 2013-05-29 | Disposition: A | Discharge status: Home / Self Care | Payer: Medicare Other | Source: Home / Self Care | Attending: Emergency Medicine | Admitting: Emergency Medicine

## 2013-05-29 DIAGNOSIS — L237 Allergic contact dermatitis due to plants, except food: Secondary | ICD-10-CM

## 2013-05-29 DIAGNOSIS — L255 Unspecified contact dermatitis due to plants, except food: Secondary | ICD-10-CM

## 2013-05-29 MED ORDER — PREDNISONE 20 MG PO TABS: ORAL_TABLET | ORAL | Status: DC

## 2013-05-29 MED ORDER — METHYLPREDNISOLONE ACETATE 80 MG/ML IJ SUSP
80.0000 mg | Freq: Once | INTRAMUSCULAR | Status: AC
  Administered 2013-05-29: 80 mg via INTRAMUSCULAR

## 2013-05-29 MED ORDER — HYDROXYZINE HCL 25 MG PO TABS: 25.0000 mg | ORAL_TABLET | Freq: Four times a day (QID) | ORAL | Status: DC | PRN

## 2013-05-29 MED ORDER — METHYLPREDNISOLONE ACETATE 80 MG/ML IJ SUSP
INTRAMUSCULAR | Status: AC
  Filled 2013-05-29: qty 1

## 2013-05-29 NOTE — Telephone Encounter (Signed)
Pt is  pt to transfer from Dr Linda Hedges on 06/10/13 with Dr Danise Mina. Pt seen [facility] on 05/27/13 with poison ivy; pt taking med prescribed but no better and pt itching badly. Advised pt from UC note to contact [facility]. pt voiced understanding and will call UC .

## 2013-05-29 NOTE — ED Notes (Signed)
Here for follow up on poison ivy rash along body States the rash has spread more and itches more.   Has used prednisone and cream as tx States sx started on Saturday and she came here Tuesday

## 2013-05-29 NOTE — Discharge Instructions (Signed)
Poison Ivy Poison ivy is a inflammation of the skin (contact dermatitis) caused by touching the allergens on the leaves of the ivy plant following previous exposure to the plant. The rash usually appears 48 hours after exposure. The rash is usually bumps (papules) or blisters (vesicles) in a linear pattern. Depending on your own sensitivity, the rash may simply cause redness and itching, or it may also progress to blisters which may break open. These must be well cared for to prevent secondary bacterial (germ) infection, followed by scarring. Keep any open areas dry, clean, dressed, and covered with an antibacterial ointment if needed. The eyes may also get puffy. The puffiness is worst in the morning and gets better as the day progresses. This dermatitis usually heals without scarring, within 2 to 3 weeks without treatment. HOME CARE INSTRUCTIONS  Thoroughly wash with soap and water as soon as you have been exposed to poison ivy. You have about one half hour to remove the plant resin before it will cause the rash. This washing will destroy the oil or antigen on the skin that is causing, or will cause, the rash. Be sure to wash under your fingernails as any plant resin there will continue to spread the rash. Do not rub skin vigorously when washing affected area. Poison ivy cannot spread if no oil from the plant remains on your body. A rash that has progressed to weeping sores will not spread the rash unless you have not washed thoroughly. It is also important to wash any clothes you have been wearing as these may carry active allergens. The rash will return if you wear the unwashed clothing, even several days later. Avoidance of the plant in the future is the best measure. Poison ivy plant can be recognized by the number of leaves. Generally, poison ivy has three leaves with flowering branches on a single stem. Diphenhydramine may be purchased over the counter and used as needed for itching. Do not drive with  this medication if it makes you drowsy.Ask your caregiver about medication for children. SEEK MEDICAL CARE IF:  Open sores develop.  Redness spreads beyond area of rash.  You notice purulent (pus-like) discharge.  You have increased pain.  Other signs of infection develop (such as fever). Document Released: 12/17/1999 Document Revised: 03/13/2011 Document Reviewed: 11/04/2008 Exit[employer] Patient Information 2014 [employer].  

## 2013-05-29 NOTE — ED Provider Notes (Signed)
  Chief Complaint    Chief Complaint  Patient presents with  . Follow-up    History of Present Illness      Megan Peterson is a 73 year old female who was seen here 2 days ago with poison ivy. She was treated with prednisone and Temovate cream. The patient is concerned because the poison ivy rash seems to be spreading. So far she has not gotten much improvement. She has extensive lesions on the arms, legs, and neck. None around the eyes. She denies any difficulty breathing or swelling of the lips, tongue, or throat.  Review of Systems   Other than as noted above, the patient denies any of the following symptoms: Systemic:  No fever or chills. ENT:  No nasal congestion, rhinorrhea, sore throat, swelling of lips, tongue or throat. Resp:  No cough, wheezing, or shortness of breath.  [place]    Past medical history, family history, social history, meds, and allergies were reviewed. She has chronic lower back pain and hyperlipidemia  Physical Exam     Vital signs:  BP 150/77  Pulse 78  Temp(Src) 97.7 F (36.5 C) (Oral)  Resp 12  SpO2 96% Gen:  Alert, oriented, in no distress. ENT:  Pharynx clear, no intraoral lesions, moist mucous membranes. Lungs:  Clear to auscultation. Skin:  She has streaks and patches of maculopapules and vesicles on her neck, upper chest, and arms.  Course in Urgent [place]     The following meds were given:  Medications  methylPREDNISolone acetate (DEPO-MEDROL) injection 80 mg (80 mg Intramuscular Given 05/29/13 1028)   Assessment    The encounter diagnosis was Poison ivy.  Plan     1.  Meds:  The following meds were prescribed:   Discharge Medication List as of 05/29/2013 10:13 AM    START taking these medications   Details  hydrOXYzine (ATARAX/VISTARIL) 25 MG tablet Take 1 tablet (25 mg total) by mouth every 6 (six) hours as needed for itching., Starting 05/29/2013, Until Discontinued, Normal        2.  Patient Education/Counseling:  The  patient was given appropriate handouts, self care instructions, and instructed in symptomatic relief.  I explained to her that this would go away but that would take time. She was given Depo-Medrol 80 mg IM, hydroxyzine for itching, and a prednisone taper.  3.  Follow up:  The patient was told to follow up here if no better in 3 to 4 days, or sooner if becoming worse in any way, and given some red flag symptoms such as worsening rash, fever, or difficulty breathing which would prompt immediate return.  Follow up here if necessary.      Harden Mo, MD 05/29/13 1044

## 2013-06-10 ENCOUNTER — Encounter: Payer: Self-pay | Admitting: Family Medicine

## 2013-06-10 ENCOUNTER — Ambulatory Visit (INDEPENDENT_AMBULATORY_CARE_PROVIDER_SITE_OTHER): Payer: Medicare Other | Admitting: Family Medicine

## 2013-06-10 VITALS — BP 138/86 | HR 84 | Temp 97.8°F | Ht 58.5 in | Wt 127.5 lb

## 2013-06-10 DIAGNOSIS — M549 Dorsalgia, unspecified: Secondary | ICD-10-CM

## 2013-06-10 DIAGNOSIS — J438 Other emphysema: Secondary | ICD-10-CM

## 2013-06-10 DIAGNOSIS — F419 Anxiety disorder, unspecified: Secondary | ICD-10-CM

## 2013-06-10 DIAGNOSIS — E785 Hyperlipidemia, unspecified: Secondary | ICD-10-CM

## 2013-06-10 DIAGNOSIS — J439 Emphysema, unspecified: Secondary | ICD-10-CM

## 2013-06-10 DIAGNOSIS — F411 Generalized anxiety disorder: Secondary | ICD-10-CM

## 2013-06-10 DIAGNOSIS — Z87891 Personal history of nicotine dependence: Secondary | ICD-10-CM | POA: Insufficient documentation

## 2013-06-10 MED ORDER — HYDROCODONE-ACETAMINOPHEN 5-325 MG PO TABS: ORAL_TABLET | ORAL | Status: DC

## 2013-06-10 NOTE — Progress Notes (Signed)
BP 138/86  Pulse 84  Temp(Src) 97.8 F (36.6 C) (Oral)  Ht 4' 10.5" (1.486 m)  Wt 127 lb 8 oz (57.834 kg)  BMI 26.19 kg/m2   CC: transfer of care  Subjective:    Patient ID: Megan Peterson, female    DOB: July 24, 1940, 73 y.o.   MRN: [id number]  HPI: Megan Peterson is a 73 y.o. female presenting on 06/10/2013 for Establish Care   Chronic back pain s/p lumbar fusion 1991 ([place]), also with stenosis. Prior on darvocet, now on hydrocodone TID regularly. Prior on darvocet until this was taken off the market. Also on gabapentin 800mg  tid. Tramadol didn't help. Elavil wasn't effective. On ibuporfen prn (about once a month). hsa not had ESI - declined in the past.   Anxiety - xanax 0.5mg  tid prn, tends to take regularly. "take a pill to handle my children"  COPD "with touch of emphysema" - no meds for this. Quit smoking 1993. <1 ppd. Able to work in yard without exertion. No coughing, wheezing. Pneumovax 06/2011, prevnar 03/2013. Gets flu shot yearly. Last CXR 2011.  HLD - prior on statins, caused myalgias. Currently off meds.  Relevant past medical, surgical, family and social history reviewed and updated as indicated.  Allergies and medications reviewed and updated. Current Outpatient Prescriptions on File Prior to Visit  Medication Sig  . ALPRAZolam (XANAX) 0.5 MG tablet Take 1 tablet (0.5 mg total) by mouth 3 (three) times daily as needed.  . diphenhydrAMINE (BENADRYL) 25 mg capsule Take 1 capsule (25 mg total) by mouth as needed.  . gabapentin (NEURONTIN) 800 MG tablet TAKE ONE TABLET BY MOUTH THREE TIMES DAILY  . ibuprofen (ADVIL,MOTRIN) 600 MG tablet Take 1 tablet (600 mg total) by mouth as needed.   No current facility-administered medications on file prior to visit.    Review of Systems Per HPI unless specifically indicated above    Objective:    BP 138/86  Pulse 84  Temp(Src) 97.8 F (36.6 C) (Oral)  Ht 4' 10.5" (1.486 m)  Wt 127 lb 8 oz (57.834 kg)  BMI 26.19 kg/m2    Physical Exam  Nursing note and vitals reviewed. Constitutional: She appears well-developed and well-nourished. No distress.  HENT:  Mouth/Throat: Oropharynx is clear and moist. No oropharyngeal exudate.  Eyes: Conjunctivae and EOM are normal. Pupils are equal, round, and reactive to light. No scleral icterus.  Neck: Normal range of motion. Neck supple.  Cardiovascular: Normal rate, regular rhythm, normal heart sounds and intact distal pulses.   No murmur heard. Pulmonary/Chest: Effort normal and breath sounds normal. No respiratory distress. She has no wheezes. She has no rales.  Musculoskeletal: She exhibits no edema.   Results for orders placed in visit on 03/11/13  LIPID PANEL      Result Value Ref Range   Cholesterol 241 (*) 0 - 200 mg/dL   Triglycerides 128.0  0.0 - 149.0 mg/dL   HDL 59.90  >39.00 mg/dL   VLDL 25.6  0.0 - 40.0 mg/dL   LDL Cholesterol 156 (*) 0 - 99 mg/dL   Total CHOL/HDL Ratio 4        Assessment & Plan:   Problem List Items Addressed This Visit   HYPERLIPIDEMIA - Primary     States statin caused myalgias. Off meds now.  Discussed RYR use- pt willing to try at QD dosing.    Relevant Medications      HYDROcodone-acetaminophen (NORCO/VICODIN) 5-325 MG per tablet   Ex-smoker  Remains abstinent - quit 1993.    COPD (chronic obstructive pulmonary disease) with emphysema     No resp complaints off meds.  Desires to stay off meds. Denies dypsnea on exertion or chest pain/wheezing/cough. Ex smoker.    BACK PAIN, CHRONIC      Reviewed hydrocodone and gabapentin use. Advised try and limit hydrocodone use.  States she uses 3-4 tablets daily with rare ibuprofen use. States she has h/o lumbar stenosis but no imaging available. Briefly discussed pain management referral option to discuss other pain management modaiities. Lab Results  Component Value Date   CREATININE 0.8 05/20/2012      Relevant Medications      HYDROcodone-acetaminophen (NORCO/VICODIN)  5-325 MG per tablet   Anxiety disorder     Pt states she regularly takes xanax for h/o anxiety. Reviewed controlled substances and benzo risk of dependence/tolerance. Will continue current # for now, with hope to decrease use in the future.  Discussed possible daily anxiety medication but pt currently hesitant for this. Will need UDS and controlled substance agreement form filled out at next visit.        Follow up plan: Return in about 4 months (around 10/10/2013), or as needed, for annual exam, prior fasting for blood work.

## 2013-06-10 NOTE — Assessment & Plan Note (Signed)
No resp complaints off meds.  Desires to stay off meds. Denies dypsnea on exertion or chest pain/wheezing/cough. Ex smoker.

## 2013-06-10 NOTE — Assessment & Plan Note (Addendum)
Pt states she regularly takes xanax for h/o anxiety. Reviewed controlled substances and benzo risk of dependence/tolerance. Will continue current # for now, with hope to decrease use in the future.  Discussed possible daily anxiety medication but pt currently hesitant for this. Will need UDS and controlled substance agreement form filled out at next visit.

## 2013-06-10 NOTE — Assessment & Plan Note (Signed)
States statin caused myalgias. Off meds now.  Discussed RYR use- pt willing to try at QD dosing.

## 2013-06-10 NOTE — Progress Notes (Signed)
Pre visit review using our clinic review tool, if applicable. No additional management support is needed unless otherwise documented below in the visit note. 

## 2013-06-10 NOTE — Patient Instructions (Signed)
I've refilled hydrocodone today. I'd like to continue discussing xanax and hydrocodone use. Try red yeast rice 600mg  once daily for cholesterol. Return at your convenience in next 4-6 months for medicare wellness exam, prior fasting for blood work.

## 2013-06-10 NOTE — Assessment & Plan Note (Signed)
Remains abstinent - quit 1993.

## 2013-06-10 NOTE — Assessment & Plan Note (Addendum)
Reviewed hydrocodone and gabapentin use. Advised try and limit hydrocodone use.  States she uses 3-4 tablets daily with rare ibuprofen use. States she has h/o lumbar stenosis but no imaging available. Briefly discussed pain management referral option to discuss other pain management modaiities. Lab Results  Component Value Date   CREATININE 0.8 05/20/2012

## 2013-07-15 ENCOUNTER — Other Ambulatory Visit: Payer: Self-pay

## 2013-07-15 DIAGNOSIS — E785 Hyperlipidemia, unspecified: Secondary | ICD-10-CM

## 2013-07-15 NOTE — Telephone Encounter (Signed)
Pt left note requesting rx hydrocodone apap. Call when ready for pick up. Pt request to pick up before 07/18/13.

## 2013-07-16 MED ORDER — HYDROCODONE-ACETAMINOPHEN 5-325 MG PO TABS: ORAL_TABLET | ORAL | Status: DC

## 2013-07-16 NOTE — Telephone Encounter (Signed)
Patient notified and Rx placed up front for pick up. 

## 2013-07-16 NOTE — Telephone Encounter (Signed)
Printed and placed in Kim's box. 

## 2013-08-13 ENCOUNTER — Other Ambulatory Visit: Payer: Self-pay

## 2013-08-13 DIAGNOSIS — E785 Hyperlipidemia, unspecified: Secondary | ICD-10-CM

## 2013-08-13 MED ORDER — HYDROCODONE-ACETAMINOPHEN 5-325 MG PO TABS: ORAL_TABLET | ORAL | Status: DC

## 2013-08-13 NOTE — Telephone Encounter (Signed)
Printed and placed in Kim's box. 

## 2013-08-13 NOTE — Telephone Encounter (Signed)
Pt left v/m requesting rx hydrocodone apap. Call when ready for pick up.  

## 2013-08-13 NOTE — Telephone Encounter (Signed)
Patient notified and Rx placed up front for pick up. 

## 2013-09-12 ENCOUNTER — Other Ambulatory Visit: Payer: Self-pay

## 2013-09-12 DIAGNOSIS — E785 Hyperlipidemia, unspecified: Secondary | ICD-10-CM

## 2013-09-12 MED ORDER — HYDROCODONE-ACETAMINOPHEN 5-325 MG PO TABS: ORAL_TABLET | ORAL | Status: DC

## 2013-09-12 NOTE — Telephone Encounter (Signed)
Pt left v/m requesting rx hydrocodone apap. Call when ready for pick up.  

## 2013-09-12 NOTE — Telephone Encounter (Signed)
Printed and placed in Kim's box. 

## 2013-09-15 NOTE — Telephone Encounter (Signed)
Patient notified and Rx placed up front for pick up. 

## 2013-09-16 ENCOUNTER — Other Ambulatory Visit: Payer: Self-pay

## 2013-09-16 DIAGNOSIS — E785 Hyperlipidemia, unspecified: Secondary | ICD-10-CM

## 2013-09-16 MED ORDER — GABAPENTIN 800 MG PO TABS: ORAL_TABLET | ORAL | Status: DC

## 2013-09-16 MED ORDER — IBUPROFEN 600 MG PO TABS: 600.0000 mg | ORAL_TABLET | ORAL | Status: DC | PRN

## 2013-09-16 MED ORDER — ALPRAZOLAM 0.5 MG PO TABS: 0.5000 mg | ORAL_TABLET | Freq: Three times a day (TID) | ORAL | Status: DC | PRN

## 2013-09-16 NOTE — Telephone Encounter (Signed)
Pt left note requesting printed rx for alprazolam,gabapentin and ibuprofen. Call pt when ready for pick up.

## 2013-09-17 ENCOUNTER — Ambulatory Visit (INDEPENDENT_AMBULATORY_CARE_PROVIDER_SITE_OTHER): Payer: Medicare Other

## 2013-09-17 DIAGNOSIS — Z23 Encounter for immunization: Secondary | ICD-10-CM

## 2013-09-17 NOTE — Telephone Encounter (Signed)
Rx left in front office for pick up and pt is aware  

## 2013-09-24 ENCOUNTER — Ambulatory Visit: Payer: Medicare Other

## 2013-10-02 DIAGNOSIS — R892 Abnormal level of other drugs, medicaments and biological substances in specimens from other organs, systems and tissues: Secondary | ICD-10-CM | POA: Insufficient documentation

## 2013-10-15 ENCOUNTER — Encounter: Payer: Self-pay | Admitting: *Deleted

## 2013-10-15 ENCOUNTER — Encounter: Payer: Self-pay | Admitting: Family Medicine

## 2013-10-15 ENCOUNTER — Ambulatory Visit (INDEPENDENT_AMBULATORY_CARE_PROVIDER_SITE_OTHER): Payer: Medicare Other | Admitting: Family Medicine

## 2013-10-15 VITALS — BP 134/78 | HR 80 | Temp 97.4°F | Wt 126.5 lb

## 2013-10-15 DIAGNOSIS — F419 Anxiety disorder, unspecified: Secondary | ICD-10-CM

## 2013-10-15 DIAGNOSIS — M545 Low back pain: Secondary | ICD-10-CM

## 2013-10-15 DIAGNOSIS — E785 Hyperlipidemia, unspecified: Secondary | ICD-10-CM

## 2013-10-15 MED ORDER — HYDROCODONE-ACETAMINOPHEN 5-325 MG PO TABS: ORAL_TABLET | ORAL | Status: DC

## 2013-10-15 NOTE — Patient Instructions (Addendum)
Hydrocodone refilled today Update controlled substance agreement today. Return in 4-6 months for fasting labwork and afterwards for medicare wellness visit Look into low cholesterol diet - handout provided.
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
## 2013-10-15 NOTE — Progress Notes (Signed)
Pre visit review using our clinic review tool, if applicable. No additional management support is needed unless otherwise documented below in the visit note. 

## 2013-10-15 NOTE — Assessment & Plan Note (Signed)
Intolerant to statin, RYR and fibrate. Discussed low chol diet. Recheck FLP next fasting labs.

## 2013-10-15 NOTE — Assessment & Plan Note (Signed)
Refilled hydrocodone, continue gabapentin for presumed lumbar stenosis. S/p surgery by Dr. Lorin Mercy in past.

## 2013-10-19 ENCOUNTER — Encounter: Payer: Self-pay | Admitting: Family Medicine

## 2013-10-22 ENCOUNTER — Encounter: Payer: Self-pay | Admitting: Family Medicine

## 2013-11-13 ENCOUNTER — Other Ambulatory Visit: Payer: Self-pay

## 2013-11-13 MED ORDER — HYDROCODONE-ACETAMINOPHEN 5-325 MG PO TABS: ORAL_TABLET | ORAL | Status: DC

## 2013-11-13 NOTE — Telephone Encounter (Signed)
Pt left v/m requesting rx hydrocodone apap. Call when ready for pick up. Pt wants to pick up on 11/14/13.

## 2013-11-13 NOTE — Telephone Encounter (Signed)
Printed and placed in Kim's box. 

## 2013-11-14 NOTE — Telephone Encounter (Signed)
Patient notified and Rx placed up front for pick up. 

## 2013-11-29 ENCOUNTER — Other Ambulatory Visit: Payer: Self-pay | Admitting: Family Medicine

## 2013-11-29 DIAGNOSIS — E785 Hyperlipidemia, unspecified: Secondary | ICD-10-CM

## 2013-11-29 DIAGNOSIS — D72829 Elevated white blood cell count, unspecified: Secondary | ICD-10-CM

## 2013-12-05 ENCOUNTER — Other Ambulatory Visit (INDEPENDENT_AMBULATORY_CARE_PROVIDER_SITE_OTHER): Payer: Medicare Other

## 2013-12-05 DIAGNOSIS — D72829 Elevated white blood cell count, unspecified: Secondary | ICD-10-CM

## 2013-12-05 DIAGNOSIS — E785 Hyperlipidemia, unspecified: Secondary | ICD-10-CM

## 2013-12-07 LAB — BASIC METABOLIC PANEL
BUN: 20 mg/dL (ref 6–23)
CALCIUM: 9.3 mg/dL (ref 8.4–10.5)
CHLORIDE: 104 meq/L (ref 96–112)
CO2: 30 mEq/L (ref 19–32)
Creatinine, Ser: 0.8 mg/dL (ref 0.4–1.2)
GFR: 80.63 mL/min (ref >60.00)
Glucose, Bld: 82 mg/dL (ref 70–99)
Potassium: 4.9 mEq/L (ref 3.5–5.1)
SODIUM: 142 meq/L (ref 135–145)

## 2013-12-07 LAB — LIPID PANEL
CHOL/HDL RATIO: 5
Cholesterol: 263 mg/dL — ABNORMAL HIGH (ref 0–200)
HDL: 55.1 mg/dL (ref >39.00)
LDL Cholesterol: 184 mg/dL — ABNORMAL HIGH (ref 0–99)
NonHDL: 207.9
Triglycerides: 120 mg/dL (ref 0.0–149.0)
VLDL: 24 mg/dL (ref 0.0–40.0)

## 2013-12-07 LAB — CBC WITH DIFFERENTIAL/PLATELET
BASOS ABS: 0 10*3/uL (ref 0.0–0.1)
Basophils Relative: 0 % (ref 0.0–3.0)
Eosinophils Absolute: 0.2 10*3/uL (ref 0.0–0.7)
Eosinophils Relative: 2.5 % (ref 0.0–5.0)
HCT: 40.4 % (ref 36.0–46.0)
Hemoglobin: 13.2 g/dL (ref 12.0–15.0)
LYMPHS ABS: 2.3 10*3/uL (ref 0.7–4.0)
Lymphocytes Relative: 24 % (ref 12.0–46.0)
MCHC: 32.5 g/dL (ref 30.0–36.0)
MCV: 92.9 fl (ref 78.0–100.0)
Monocytes Absolute: 0.1 10*3/uL (ref 0.1–1.0)
Monocytes Relative: 0.8 % — ABNORMAL LOW (ref 3.0–12.0)
Neutro Abs: 6.9 10*3/uL (ref 1.4–7.7)
Neutrophils Relative %: 72.7 % (ref 43.0–77.0)
PLATELETS: 283 10*3/uL (ref 150.0–400.0)
RBC: 4.35 Mil/uL (ref 3.87–5.11)
RDW: 12.6 % (ref 11.5–15.5)
WBC: 9.5 10*3/uL (ref 4.0–10.5)

## 2013-12-12 ENCOUNTER — Other Ambulatory Visit: Payer: Self-pay | Admitting: *Deleted

## 2013-12-12 ENCOUNTER — Encounter: Payer: Self-pay | Admitting: Family Medicine

## 2013-12-12 ENCOUNTER — Ambulatory Visit (INDEPENDENT_AMBULATORY_CARE_PROVIDER_SITE_OTHER): Payer: Medicare Other | Admitting: Family Medicine

## 2013-12-12 VITALS — BP 144/80 | HR 84 | Temp 98.1°F | Ht 58.5 in | Wt 121.5 lb

## 2013-12-12 DIAGNOSIS — Z0000 Encounter for general adult medical examination without abnormal findings: Secondary | ICD-10-CM

## 2013-12-12 DIAGNOSIS — Z7189 Other specified counseling: Secondary | ICD-10-CM

## 2013-12-12 DIAGNOSIS — M545 Low back pain, unspecified: Secondary | ICD-10-CM

## 2013-12-12 DIAGNOSIS — E2839 Other primary ovarian failure: Secondary | ICD-10-CM

## 2013-12-12 DIAGNOSIS — G8929 Other chronic pain: Secondary | ICD-10-CM

## 2013-12-12 DIAGNOSIS — R0989 Other specified symptoms and signs involving the circulatory and respiratory systems: Secondary | ICD-10-CM

## 2013-12-12 DIAGNOSIS — E785 Hyperlipidemia, unspecified: Secondary | ICD-10-CM

## 2013-12-12 DIAGNOSIS — R892 Abnormal level of other drugs, medicaments and biological substances in specimens from other organs, systems and tissues: Secondary | ICD-10-CM

## 2013-12-12 HISTORY — DX: Encounter for general adult medical examination without abnormal findings: Z00.00

## 2013-12-12 HISTORY — DX: Other specified counseling: Z71.89

## 2013-12-12 HISTORY — DX: Other specified symptoms and signs involving the circulatory and respiratory systems: R09.89

## 2013-12-12 MED ORDER — HYDROCODONE-ACETAMINOPHEN 5-325 MG PO TABS: ORAL_TABLET | ORAL | Status: DC

## 2013-12-12 NOTE — Assessment & Plan Note (Signed)
Will schedule carotid US

## 2013-12-12 NOTE — Assessment & Plan Note (Signed)
Continue hydrocodone and gabapentin.

## 2013-12-12 NOTE — Assessment & Plan Note (Signed)
Preventative protocols reviewed and updated unless pt declined. Discussed healthy diet and lifestyle.  
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
## 2013-12-12 NOTE — Assessment & Plan Note (Signed)
LDL remains elevated - will continue healthy diet changes.

## 2013-12-12 NOTE — Assessment & Plan Note (Signed)

## 2013-12-12 NOTE — Progress Notes (Signed)
Pre visit review using our clinic review tool, if applicable. No additional management support is needed unless otherwise documented below in the visit note. 

## 2013-12-12 NOTE — Patient Instructions (Addendum)
Call to schedule mammogram at [facility].  Pass by Marion's office to schedule bone density scan. Schedule carotid artery ultrasound (after Christmas). Advanced planning packet provided today. Good to see you today, call us with questions. Urine drug screen today.

## 2013-12-12 NOTE — Assessment & Plan Note (Signed)
Advanced planning - discussed with daughter. Doesn't want prolonged life support. Handout provided today

## 2013-12-12 NOTE — Assessment & Plan Note (Signed)
Discussed abnormal UDS from 10/2013 - inappropriately negative for xanax despite pt using regularly #90/month. Pt unsure why negative. Advised I want to recheck UDS - then pt states she has not taken in last 4 days because she left pills at her son's house - when previously she had told me she takes 1-3 daily regularly. Discussed this is concerning, I recommend more frequent UDS and if remaining abnormal will need to slowly taper her off medication. Denies diversion.

## 2014-01-02 DIAGNOSIS — M8580 Other specified disorders of bone density and structure, unspecified site: Secondary | ICD-10-CM

## 2014-01-02 HISTORY — PX: OTHER SURGICAL HISTORY: SHX169

## 2014-01-02 HISTORY — DX: Other specified disorders of bone density and structure, unspecified site: M85.80

## 2014-01-06 ENCOUNTER — Ambulatory Visit (HOSPITAL_COMMUNITY): Payer: PPO | Attending: Cardiovascular Disease | Admitting: *Deleted

## 2014-01-06 DIAGNOSIS — Z87891 Personal history of nicotine dependence: Secondary | ICD-10-CM | POA: Insufficient documentation

## 2014-01-06 DIAGNOSIS — E785 Hyperlipidemia, unspecified: Secondary | ICD-10-CM | POA: Diagnosis not present

## 2014-01-06 DIAGNOSIS — J449 Chronic obstructive pulmonary disease, unspecified: Secondary | ICD-10-CM | POA: Diagnosis not present

## 2014-01-06 DIAGNOSIS — R0989 Other specified symptoms and signs involving the circulatory and respiratory systems: Secondary | ICD-10-CM | POA: Insufficient documentation

## 2014-01-06 LAB — HM MAMMOGRAPHY: HM Mammogram: NORMAL

## 2014-01-06 NOTE — Progress Notes (Signed)
Carotid Duplex Exam - within normal limits

## 2014-01-09 ENCOUNTER — Encounter: Payer: Self-pay | Admitting: Family Medicine

## 2014-01-10 ENCOUNTER — Other Ambulatory Visit: Payer: Self-pay | Admitting: Family Medicine

## 2014-01-10 ENCOUNTER — Encounter: Payer: Self-pay | Admitting: Family Medicine

## 2014-01-10 DIAGNOSIS — M8580 Other specified disorders of bone density and structure, unspecified site: Secondary | ICD-10-CM

## 2014-01-10 MED ORDER — CALCIUM CARBONATE-VITAMIN D 600-400 MG-UNIT PO TABS: 1.0000 | ORAL_TABLET | Freq: Every day | ORAL | Status: DC

## 2014-01-12 ENCOUNTER — Encounter: Payer: Self-pay | Admitting: *Deleted

## 2014-01-12 ENCOUNTER — Other Ambulatory Visit: Payer: Self-pay

## 2014-01-12 MED ORDER — HYDROCODONE-ACETAMINOPHEN 5-325 MG PO TABS: ORAL_TABLET | ORAL | Status: DC

## 2014-01-12 NOTE — Telephone Encounter (Signed)
printed and in Kim's box. Pt will need UDS when comes in to pick up

## 2014-01-12 NOTE — Telephone Encounter (Signed)
Pt left v/m requesting rx hydrocodone apap. Call when ready for pick up.  

## 2014-01-13 ENCOUNTER — Encounter: Payer: Self-pay | Admitting: *Deleted

## 2014-01-13 ENCOUNTER — Encounter: Payer: Self-pay | Admitting: Family Medicine

## 2014-01-13 ENCOUNTER — Telehealth: Payer: Self-pay | Admitting: Family Medicine

## 2014-01-13 MED ORDER — HYDROCODONE-ACETAMINOPHEN 5-325 MG PO TABS: ORAL_TABLET | ORAL | Status: DC

## 2014-01-13 NOTE — Telephone Encounter (Signed)
She can schedule/transfer with me, but the prev UDS discussion with Dr. Darnell Level still stands.

## 2014-01-13 NOTE — Telephone Encounter (Signed)
Pt would like to tranfer from Dr Danise Mina to Dr Damita Dunnings. Is this ok?

## 2014-01-13 NOTE — Telephone Encounter (Signed)
Patient notified and Rx placed up front for pick up. Patient notified that urine was needed.

## 2014-01-13 NOTE — Telephone Encounter (Signed)
Prior patient of Dr Linda Hedges.  Ok to transfer.  UDS pending.

## 2014-01-14 NOTE — Telephone Encounter (Signed)
Patient notified as instructed by telephone and verbalized understanding. Patient stated that she will call back and schedule an appointment with Dr. Damita Dunnings.

## 2014-01-19 ENCOUNTER — Encounter: Payer: Self-pay | Admitting: Family Medicine

## 2014-01-30 ENCOUNTER — Encounter: Payer: Self-pay | Admitting: Family Medicine

## 2014-01-30 ENCOUNTER — Telehealth: Payer: Self-pay | Admitting: Family Medicine

## 2014-01-30 NOTE — Telephone Encounter (Signed)
Noted.  Will see at OV.  

## 2014-01-30 NOTE — Telephone Encounter (Addendum)
Spoke with patient re abnormal UDS - 2nd time inappropriately negative for xanax. Always appropriately positive for hydrocodone. Advised I wouldn't approve continued xanax prescription. Pt states "that's fine, we'll just have to find something else". Pt states negative UDS is because she started cutting back to PRN since when I discussed starting to cut back on xanax 06/2013 which may be true but then she should have several scripts for xanax left over.  Last xanax prescription was on 09/16/2013 #90 with RF:5. Pt has appt Monday with new PCP Dr Damita Dunnings. Will route to him as well.

## 2014-02-02 ENCOUNTER — Ambulatory Visit (INDEPENDENT_AMBULATORY_CARE_PROVIDER_SITE_OTHER): Payer: PPO | Admitting: Family Medicine

## 2014-02-02 ENCOUNTER — Encounter: Payer: Self-pay | Admitting: Family Medicine

## 2014-02-02 VITALS — BP 120/80 | HR 96 | Temp 97.9°F | Wt 119.5 lb

## 2014-02-02 DIAGNOSIS — F411 Generalized anxiety disorder: Secondary | ICD-10-CM

## 2014-02-02 DIAGNOSIS — R0989 Other specified symptoms and signs involving the circulatory and respiratory systems: Secondary | ICD-10-CM

## 2014-02-02 DIAGNOSIS — M545 Low back pain, unspecified: Secondary | ICD-10-CM

## 2014-02-02 DIAGNOSIS — G8929 Other chronic pain: Secondary | ICD-10-CM

## 2014-02-02 MED ORDER — BUSPIRONE HCL 7.5 MG PO TABS: 7.5000 mg | ORAL_TABLET | Freq: Two times a day (BID) | ORAL | Status: DC | PRN
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
## 2014-02-02 NOTE — Patient Instructions (Signed)
Start with 1 buspar a day for now and then gradually increase to 1 tab twice a day if needed.  Take care.  If not any better with this medicine, then let me know.  Call back a few days before you need your refills.

## 2014-02-03 NOTE — Assessment & Plan Note (Signed)
Change to buspar, she can uptitrate to BID if needed.  cymbalta may be useful in the future.  She declined counseling now.  Okay for outpatient f/u.  No SI/HI. >25 minutes spent in face to face time with patient, >50% spent in counselling or coordination of care.

## 2014-02-03 NOTE — Addendum Note (Signed)
Addended by: Tonia Ghent on: 02/03/2014 01:07 AM   Modules accepted: Level of Service

## 2014-02-03 NOTE — Assessment & Plan Note (Signed)
Continue current pain meds, no ADE on meds.  UDS reviewed.

## 2014-02-03 NOTE — Assessment & Plan Note (Signed)
D/w pt about f/u study and lipid tx.  Statin intolerant.

## 2014-02-05 ENCOUNTER — Encounter: Payer: Self-pay | Admitting: Family Medicine

## 2014-02-11 ENCOUNTER — Other Ambulatory Visit: Payer: Self-pay

## 2014-02-11 MED ORDER — HYDROCODONE-ACETAMINOPHEN 5-325 MG PO TABS
ORAL_TABLET | ORAL | Status: DC
Start: 2014-02-11 — End: 2014-03-11

## 2014-02-11 NOTE — Telephone Encounter (Signed)
Printed.  Thanks.  

## 2014-02-11 NOTE — Telephone Encounter (Signed)
Patient notified by telephone that script is up front ready for pickup. 

## 2014-02-11 NOTE — Telephone Encounter (Signed)
Pt left v/m requesting rx hydrocodone apap. Call when ready for pick up. Pt last seen 02/02/14.

## 2014-03-11 ENCOUNTER — Telehealth: Payer: Self-pay

## 2014-03-11 MED ORDER — HYDROCODONE-ACETAMINOPHEN 5-325 MG PO TABS: ORAL_TABLET | ORAL | Status: DC

## 2014-03-11 NOTE — Telephone Encounter (Signed)
Add on to schedule tomorrow.  She'll need to pick up hydrocodone in person and will need OV about anxiety meds.  Hydrocodone printed.  Thanks.

## 2014-03-11 NOTE — Telephone Encounter (Signed)
Noted. Thanks.

## 2014-03-11 NOTE — Telephone Encounter (Signed)
Spoke to pt and she is unable to make it tomorrow as she has to take her grandchildren somewhere--i did put her on schedule for Fri morning and she will pick up Rx then--i told pt i will place Rx in front office and she can pick it up there when she comes for her appt

## 2014-03-11 NOTE — Telephone Encounter (Signed)
Pt left v/m requesting rx hydrocodone apap. Call when ready for pick up. Pt said not due for refill on hydrocodone until 03/14/14.Pt last seen 02/02/2014. Pt also said after takes buspirone pt stays dizzy and nauseated for 3 hours. Pt request substitute med for buspirone to [facility]. Pt request cb.

## 2014-03-13 ENCOUNTER — Encounter: Payer: Self-pay | Admitting: Family Medicine

## 2014-03-13 ENCOUNTER — Ambulatory Visit (INDEPENDENT_AMBULATORY_CARE_PROVIDER_SITE_OTHER): Payer: PPO | Admitting: Family Medicine

## 2014-03-13 VITALS — BP 130/68 | HR 124 | Temp 97.4°F | Wt 117.2 lb

## 2014-03-13 DIAGNOSIS — F411 Generalized anxiety disorder: Secondary | ICD-10-CM

## 2014-03-13 MED ORDER — ESCITALOPRAM OXALATE 5 MG PO TABS: 5.0000 mg | ORAL_TABLET | Freq: Every day | ORAL | Status: DC

## 2014-03-13 NOTE — Progress Notes (Signed)
Pre visit review using our clinic review tool, if applicable. No additional management support is needed unless otherwise documented below in the visit note.  Recheck pulse 80, sitting in exam room.    UDS w/o BZD ever positive.  Was changed to buspar, but couldn't tolerate it.  She felt dizzy and nauseated on the medicine.  She tried it with and without food.  No help with either.  Dizziness resolved off the buspar.  Anxiety continues.  Other has noted that she was more anxious, less happy.  Daily sx.  Some days she doesn't leave the house, "not motivated enough to see people."  She still goes out on Thursdays to see her grandkids.  No SI/HI.  D/w pt about daily meds.  She couldn't tolerate paxil and elavil prev, but neither was a true allergy, ie neither is a contraindication to lexapro trial.   Meds, vitals, and allergies reviewed.   ROS: See HPI.  Otherwise, noncontributory.  Tearful but regains composure.  nad Mmm rrr ctab

## 2014-03-13 NOTE — Patient Instructions (Signed)
Try lexapro 5mg  a day.  You should notice a gradual improvement after you have been on it for a while.  If you aren't improving, we may need to increase the dose.  I intentionally started a low dose to see if you can tolerate it.  Take care.  Glad to see you.

## 2014-03-15 NOTE — Assessment & Plan Note (Signed)
She couldn't tolerate paxil and elavil prev, but neither was a true allergy, ie neither is a contraindication to lexapro trial.  Start with low dose and she'll update me as needed.  I didn't rx BZD again.  D/w pt.

## 2014-04-13 ENCOUNTER — Other Ambulatory Visit: Payer: Self-pay

## 2014-04-13 MED ORDER — HYDROCODONE-ACETAMINOPHEN 5-325 MG PO TABS: ORAL_TABLET | ORAL | Status: DC

## 2014-04-13 NOTE — Telephone Encounter (Signed)
Patient advised.  Rx left at front desk for pick up. 

## 2014-04-13 NOTE — Telephone Encounter (Signed)
Printed.  Thanks.  

## 2014-04-13 NOTE — Telephone Encounter (Signed)
Pt left v/m requesting rx hydrocodone apap. Call when ready for pick up. Pt last seen 03/13/14.

## 2014-04-14 ENCOUNTER — Telehealth: Payer: Self-pay

## 2014-04-14 NOTE — Telephone Encounter (Signed)
Patient advised. Appointment scheduled.  

## 2014-04-14 NOTE — Telephone Encounter (Signed)
Megan Peterson [phone]  Neala stop be to pick up her prescription and she want me to let Dr Damita Dunnings know that she had stop taking the escitalopram (LEXAPRO) 5 MG tablet, because of the way it made her feel.

## 2014-04-14 NOTE — Telephone Encounter (Signed)
Noted, if she wants to consider other options then schedule a follow up appointment.  I updated her med list.  Thanks .

## 2014-04-20 ENCOUNTER — Encounter: Payer: Self-pay | Admitting: Family Medicine

## 2014-04-20 ENCOUNTER — Ambulatory Visit (INDEPENDENT_AMBULATORY_CARE_PROVIDER_SITE_OTHER): Payer: PPO | Admitting: Family Medicine

## 2014-04-20 VITALS — BP 144/76 | HR 91 | Temp 98.1°F | Wt 119.0 lb

## 2014-04-20 DIAGNOSIS — F411 Generalized anxiety disorder: Secondary | ICD-10-CM | POA: Diagnosis not present

## 2014-04-20 MED ORDER — DULOXETINE HCL 30 MG PO CPEP: 30.0000 mg | ORAL_CAPSULE | Freq: Every day | ORAL | Status: DC

## 2014-04-20 NOTE — Assessment & Plan Note (Signed)
With h/o chronic pain, reasonable to try cymbalta with routine cautions.  She agrees.  Update me as needed.  D/w pt about possible psych referral if not improved.  She can consider.  Okay for outpatient f/u. >15 minutes spent in face to face time with patient, >50% spent in counselling or coordination of care

## 2014-04-20 NOTE — Patient Instructions (Signed)
Try cymbalta once a day and see if that helps the pain and your mood.   Update me as needed.  Take care.

## 2014-04-20 NOTE — Progress Notes (Signed)
Pre visit review using our clinic review tool, if applicable. No additional management support is needed unless otherwise documented below in the visit note.  She didn't tolerate lexapro, not a true allergy but had heartburn and couldn't tolerate it.  Intolerance list updated.  Still with anxiety, insomnia, not feeling like getting out of the house.  No SI/HI.  We talked about med options, ie wouldn't use BZD daily, didn't get relief with buspar and lexapro.  We talked about cymbalta.    Meds, vitals, and allergies reviewed.   ROS: See HPI.  Otherwise, noncontributory.  nad Speech wnl Judgement wnl Remainder of exam deferred.

## 2014-04-26 NOTE — Op Note (Signed)
PATIENT NAME:  Megan Peterson, Megan Peterson MR#:  [id number] DATE OF BIRTH:  1940/01/06  DATE OF PROCEDURE:  04/05/2011  PROCEDURES PERFORMED:  1. Pars plana vitrectomy of the left eye.  2. Internal limiting membrane peel of the left eye.   PREOPERATIVE DIAGNOSES:  1. Epiretinal membrane of the left eye.  2. Retinal edema of the left eye.   POSTOPERATIVE DIAGNOSES:  1. Epiretinal membrane of the left eye.  2. Retinal edema of the left eye.   ESTIMATED BLOOD LOSS: Less than 1 mL.  PRIMARY SURGEON: Garlan Fair, M.D.   ANESTHESIA: Retrobulbar block of the left eye with monitored anesthesia care.   COMPLICATIONS: None.   INDICATIONS FOR PROCEDURE: This is a patient who had slowly decreasing vision in the left eye. Examination revealed an epiretinal membrane with associated retinal edema. Risks, benefits, and alternatives of the above procedure were discussed and the patient wished to proceed.   DETAILS OF PROCEDURE: After informed consent was obtained, the patient was brought to the operative suite at [facility]. The patient was placed in the supine position and was given a small dose of propofol and a retrobulbar block was performed on the left eye by the primary surgeon without any complications. The left eye was prepped and draped in sterile manner. After lid speculum was inserted a 25-gauge trocar was placed inferotemporally through displaced conjunctiva in an oblique fashion 3 mm beyond the limbus. The infusion cannula was turned on and inserted through the trocar and secured in position with Steri-Strips. Two more trocars were placed in a similar fashion superotemporally and superonasally. Vitreous cutter and light pipe were introduced in the eye and a core vitrectomy was performed. The vitreous face was confirmed as already elevated and peripheral vitreous was trimmed for 360 degrees. Indocyanine green was injected onto the posterior pole and removed within 30 seconds. An  internal limiting membrane and epiretinal membrane peel were performed around the fovea for a diameter of approximately 1.5 to 2 disk diameters for 360 degrees without any complications. Scleral depressed exam was then performed for 360 degrees and no signs of any subsequent breaks or tears could be identified. A partial air-fluid exchange was performed and the trocars were removed. The wounds were noted to be airtight and pressure in the eye was confirmed to be approximately 15 mmHg. 5 mg of dexamethasone was given to the inferior fornix and the lid speculum was removed. The eye was cleaned and TobraDex was placed in the eye. A patch and shield were placed over the eye. The patient was taken to postanesthesia care with instructions to remain head up.    ____________________________ Teresa Pelton. Starling Manns, MD mfa:bjt D: 04/05/2011 08:09:00 ET T: 04/05/2011 10:46:32 ET JOB#: [id number]  cc: Teresa Pelton. Starling Manns, MD, <Dictator> Coralee Rud MD ELECTRONICALLY SIGNED 04/05/2011 13:02

## 2014-05-15 ENCOUNTER — Other Ambulatory Visit: Payer: Self-pay

## 2014-05-15 NOTE — Telephone Encounter (Signed)
Pt left v/m requesting rx hydrocodone apap. Call when ready for pick up. Pt seen 04/20/14 and last printed #120 on 04/13/14. Pt is not out of med.

## 2014-05-17 MED ORDER — HYDROCODONE-ACETAMINOPHEN 5-325 MG PO TABS: ORAL_TABLET | ORAL | Status: DC

## 2014-05-17 NOTE — Telephone Encounter (Signed)
Printed. Thanks.  How is her pain and mood on cymbalta?

## 2014-05-18 NOTE — Telephone Encounter (Signed)
Noted, med list updated.

## 2014-05-18 NOTE — Telephone Encounter (Signed)
Patient advised. Rx left at front desk for pick up.   Patient says she took the Cymbalta for about 2 weeks but it made her so lethargic, she couldn't function so she stopped it.

## 2014-06-17 ENCOUNTER — Other Ambulatory Visit: Payer: Self-pay

## 2014-06-17 MED ORDER — HYDROCODONE-ACETAMINOPHEN 5-325 MG PO TABS: ORAL_TABLET | ORAL | Status: DC

## 2014-06-17 NOTE — Telephone Encounter (Signed)
Pt left v/m requesting rx hydrocodone apap. Call when ready for pick up. Last annual exam 12/12/13 and rx last printed # 120 on 05/17/14.

## 2014-06-17 NOTE — Telephone Encounter (Signed)
Printed.  Thanks.  

## 2014-06-17 NOTE — Telephone Encounter (Signed)
Patient notified by telephone that script is up front ready for pickup. 

## 2014-06-18 ENCOUNTER — Other Ambulatory Visit: Payer: Self-pay | Admitting: Family Medicine

## 2014-07-16 ENCOUNTER — Other Ambulatory Visit: Payer: Self-pay

## 2014-07-16 MED ORDER — HYDROCODONE-ACETAMINOPHEN 5-325 MG PO TABS: ORAL_TABLET | ORAL | Status: DC

## 2014-07-16 NOTE — Telephone Encounter (Signed)
Patient advised.  Rx left at front desk for pick up. 

## 2014-07-16 NOTE — Telephone Encounter (Signed)
Printed.  Thanks.  

## 2014-07-16 NOTE — Telephone Encounter (Signed)
Pt left v/m requesting rx hydrocodone apap. Call when ready for pick up.rx last printed # 120 on 06/17/14 and pt last seen 04/20/14.

## 2014-08-18 ENCOUNTER — Other Ambulatory Visit: Payer: Self-pay

## 2014-08-18 NOTE — Telephone Encounter (Signed)
Pt left v/m requesting rx hydrocodone apap. Call when ready for pick up. rx last printed # 120 on 07/16/14. Pt last seen 04/20/14.Please advise.

## 2014-08-19 MED ORDER — HYDROCODONE-ACETAMINOPHEN 5-325 MG PO TABS: ORAL_TABLET | ORAL | Status: DC

## 2014-08-19 NOTE — Telephone Encounter (Signed)
Patient notified by telephone that script is up front ready for pickup. 

## 2014-08-19 NOTE — Telephone Encounter (Signed)
Printed.  Thanks.  

## 2014-08-20 ENCOUNTER — Other Ambulatory Visit: Payer: Self-pay | Admitting: Family Medicine

## 2014-08-20 NOTE — Telephone Encounter (Signed)
This looks early.  Please clarify.  Thanks.

## 2014-08-20 NOTE — Telephone Encounter (Signed)
Electronic refill request, last OV on 04/20/14, last refilled on 06/18/14 #270 with 0 refills (? If to early, not due until 09/18/14 unless you have changed directions on meds)

## 2014-09-17 ENCOUNTER — Other Ambulatory Visit: Payer: Self-pay

## 2014-09-17 MED ORDER — HYDROCODONE-ACETAMINOPHEN 5-325 MG PO TABS: ORAL_TABLET | ORAL | Status: DC

## 2014-09-17 NOTE — Telephone Encounter (Signed)
Printed.  Thanks.  

## 2014-09-17 NOTE — Telephone Encounter (Signed)
Pt left v/m requesting rx hydrocodone apap. Call when ready for pick up. rx last printed #120 on 08/19/14. Last seen 04/20/14.

## 2014-09-17 NOTE — Telephone Encounter (Signed)
Notified patient that Rx was here at the office ready to be picked up. Patient said she would pick it up tomorrow morning.

## 2014-10-16 ENCOUNTER — Other Ambulatory Visit: Payer: Self-pay

## 2014-10-16 MED ORDER — HYDROCODONE-ACETAMINOPHEN 5-325 MG PO TABS: ORAL_TABLET | ORAL | Status: DC

## 2014-10-16 MED ORDER — GABAPENTIN 800 MG PO TABS: 800.0000 mg | ORAL_TABLET | Freq: Three times a day (TID) | ORAL | Status: DC

## 2014-10-16 NOTE — Telephone Encounter (Signed)
Printed hydrocodone, gabapentin sent.  Thanks.

## 2014-10-16 NOTE — Telephone Encounter (Signed)
Spoke to pt and informed her Rx is available for pickup from the front desk. Pt advised third party unable to pickup 

## 2014-10-16 NOTE — Telephone Encounter (Signed)
Pt left v/m requesting rx hydrocodone apap(last printed rx # 120 on 09/17/14) and refill gabapentin (last refilled # 270 on 06/18/14)to [place]. Call when ready for pick up.last seen 04/20/14.

## 2014-10-19 ENCOUNTER — Encounter: Payer: Self-pay | Admitting: Family Medicine

## 2014-11-05 ENCOUNTER — Encounter: Payer: Self-pay | Admitting: Family Medicine

## 2014-11-16 ENCOUNTER — Other Ambulatory Visit: Payer: Self-pay

## 2014-11-16 MED ORDER — HYDROCODONE-ACETAMINOPHEN 5-325 MG PO TABS: ORAL_TABLET | ORAL | Status: DC

## 2014-11-16 NOTE — Telephone Encounter (Signed)
Pt left v/m requesting rx hydrocodone apap. Call when ready for pick up. rx last printed # 120 on 10/16/14. Last seen 04/20/14 and no future appt scheduled.

## 2014-11-16 NOTE — Telephone Encounter (Signed)
Patient notified by telephone that script is up front ready for pickup. 

## 2014-11-16 NOTE — Telephone Encounter (Signed)
Printed.  Thanks.  

## 2014-12-15 ENCOUNTER — Other Ambulatory Visit: Payer: Self-pay

## 2014-12-15 NOTE — Telephone Encounter (Signed)
Pt left v/m requesting rx hydrocodone apap. Call when ready for pick up. rx last printed # 120 on 11/16/14. Last seen 04/20/14.

## 2014-12-16 MED ORDER — HYDROCODONE-ACETAMINOPHEN 5-325 MG PO TABS: ORAL_TABLET | ORAL | Status: DC

## 2014-12-16 NOTE — Telephone Encounter (Signed)
Patient notified by telephone that script is up front ready for pickup. 

## 2014-12-16 NOTE — Telephone Encounter (Signed)
Printed.  Thanks.  

## 2014-12-17 ENCOUNTER — Other Ambulatory Visit: Payer: Self-pay | Admitting: Family Medicine

## 2014-12-17 NOTE — Telephone Encounter (Signed)
Electronic refill request. Last Filled:    30 tablet 5 Rf 09/16/2013  Last office visit:   04/20/14.  Please advise.

## 2015-01-13 ENCOUNTER — Other Ambulatory Visit: Payer: Self-pay

## 2015-01-13 MED ORDER — GABAPENTIN 800 MG PO TABS: 800.0000 mg | ORAL_TABLET | Freq: Three times a day (TID) | ORAL | Status: DC

## 2015-01-13 MED ORDER — HYDROCODONE-ACETAMINOPHEN 5-325 MG PO TABS: ORAL_TABLET | ORAL | Status: DC

## 2015-01-13 NOTE — Telephone Encounter (Signed)
Patient notified by telephone that scripts are up front ready for pickup. Appointment scheduled.

## 2015-01-13 NOTE — Telephone Encounter (Signed)
Pt left v/m requesting refill gabapentin(last refilled # 270 on 10/16/14) to [place] and rx for hydrocodone apap(last printed # 120 on 12/16/14). Call when ready for pick up. Pt wants to pick up prior to weekend. Last seen 04/20/14.

## 2015-01-13 NOTE — Telephone Encounter (Signed)
Both done.   Needs CPE scheduled.  Thanks.

## 2015-02-16 ENCOUNTER — Other Ambulatory Visit: Payer: Self-pay | Admitting: *Deleted

## 2015-02-16 NOTE — Telephone Encounter (Signed)
Last filled #120 on 1/11/174.  Patient has lab appointment on 2/16 and would like to pick up at that time.  CPE scheduled for 2/23.  Okay to refill?

## 2015-02-17 MED ORDER — HYDROCODONE-ACETAMINOPHEN 5-325 MG PO TABS: ORAL_TABLET | ORAL | Status: DC

## 2015-02-17 NOTE — Telephone Encounter (Signed)
Printed.  Thanks.  

## 2015-02-17 NOTE — Telephone Encounter (Signed)
Patient notified by telephone that script is up front ready for pickup. 

## 2015-02-18 ENCOUNTER — Other Ambulatory Visit (INDEPENDENT_AMBULATORY_CARE_PROVIDER_SITE_OTHER): Payer: PPO

## 2015-02-18 ENCOUNTER — Other Ambulatory Visit: Payer: Self-pay | Admitting: Family Medicine

## 2015-02-18 DIAGNOSIS — M8580 Other specified disorders of bone density and structure, unspecified site: Secondary | ICD-10-CM | POA: Diagnosis not present

## 2015-02-18 DIAGNOSIS — E785 Hyperlipidemia, unspecified: Secondary | ICD-10-CM

## 2015-02-18 LAB — COMPREHENSIVE METABOLIC PANEL
ALBUMIN: 4.3 g/dL (ref 3.5–5.2)
ALK PHOS: 83 U/L (ref 39–117)
ALT: 10 U/L (ref 0–35)
AST: 20 U/L (ref 0–37)
BUN: 23 mg/dL (ref 6–23)
CALCIUM: 9.6 mg/dL (ref 8.4–10.5)
CHLORIDE: 103 meq/L (ref 96–112)
CO2: 31 mEq/L (ref 19–32)
Creatinine, Ser: 0.8 mg/dL (ref 0.40–1.20)
GFR: 74.59 mL/min (ref >60.00)
Glucose, Bld: 103 mg/dL — ABNORMAL HIGH (ref 70–99)
POTASSIUM: 4.4 meq/L (ref 3.5–5.1)
SODIUM: 139 meq/L (ref 135–145)
Total Bilirubin: 0.5 mg/dL (ref 0.2–1.2)
Total Protein: 7.2 g/dL (ref 6.0–8.3)

## 2015-02-18 LAB — LIPID PANEL
CHOLESTEROL: 284 mg/dL — AB (ref 0–200)
HDL: 53.3 mg/dL (ref >39.00)
LDL CALC: 204 mg/dL — AB (ref 0–99)
NonHDL: 230.27
TRIGLYCERIDES: 130 mg/dL (ref 0.0–149.0)
Total CHOL/HDL Ratio: 5
VLDL: 26 mg/dL (ref 0.0–40.0)

## 2015-02-18 LAB — VITAMIN D 25 HYDROXY (VIT D DEFICIENCY, FRACTURES): VITD: 20.57 ng/mL — ABNORMAL LOW (ref 30.00–100.00)

## 2015-02-25 ENCOUNTER — Encounter: Payer: Self-pay | Admitting: Family Medicine

## 2015-02-25 ENCOUNTER — Ambulatory Visit (INDEPENDENT_AMBULATORY_CARE_PROVIDER_SITE_OTHER): Payer: PPO | Admitting: Family Medicine

## 2015-02-25 VITALS — BP 110/66 | HR 83 | Temp 98.3°F | Ht 59.0 in | Wt 121.5 lb

## 2015-02-25 DIAGNOSIS — M545 Low back pain, unspecified: Secondary | ICD-10-CM

## 2015-02-25 DIAGNOSIS — Z0000 Encounter for general adult medical examination without abnormal findings: Secondary | ICD-10-CM

## 2015-02-25 DIAGNOSIS — E559 Vitamin D deficiency, unspecified: Secondary | ICD-10-CM | POA: Insufficient documentation

## 2015-02-25 DIAGNOSIS — E785 Hyperlipidemia, unspecified: Secondary | ICD-10-CM

## 2015-02-25 DIAGNOSIS — Z7189 Other specified counseling: Secondary | ICD-10-CM

## 2015-02-25 DIAGNOSIS — G8929 Other chronic pain: Secondary | ICD-10-CM | POA: Diagnosis not present

## 2015-02-25 HISTORY — DX: Vitamin D deficiency, unspecified: E55.9

## 2015-02-25 NOTE — Assessment & Plan Note (Signed)
Intolerant of statins. D/w pt about diet and exercise. Given her reaction to statin and her preference not to start another med, then reasonable to continue with diet and exercise, d/w pt about that along with labs. She agrees.

## 2015-02-25 NOTE — Patient Instructions (Signed)
Don't change your meds for now.  If your pain is worse and you want to consider other options (other meds, referral), then let me know.  Try eating more servings of dairy products (like yogurt) for the extra vitamin D.  Take care.  Glad to see you.

## 2015-02-25 NOTE — Assessment & Plan Note (Signed)
Still with lower back pain and B leg pain. Off statin. Still on baseline meds. No ADE on meds. Still with back pain since last surgery per patient report. Offered referral re: back pain. Declined. She wants to avoid surgery. I told her I didn't have much else to offer. "I can get by."  She'll update me as needed.  No change in meds at this point.  She isn't sedated.

## 2015-02-25 NOTE — Progress Notes (Signed)
Pre visit review using our clinic review tool, if applicable. No additional management support is needed unless otherwise documented below in the visit note.  I have personally reviewed the Medicare Annual Wellness questionnaire and have noted 1. The patient's medical and social history 2. Their use of alcohol, tobacco or illicit drugs 3. Their current medications and supplements 4. The patient's functional ability including ADL's, fall risks, home safety risks and hearing or visual             impairment. 5. Diet and physical activities 6. Evidence for depression or mood disorders  The patients weight, height, BMI have been recorded in the chart and visual acuity is per eye clinic.  I have made referrals, counseling and provided education to the patient based review of the above and I have provided the pt with a written personalized care plan for preventive services.  Provider list updated- see scanned forms.  Routine anticipatory guidance given to patient.  See health maintenance.  Flu 2016 Shingles 20132 PNA 2015 Tetanus 2013 Colonoscopy 2008 Breast cancer screening- done 2016, encouraged routine f/u.  DXA done 2016 Advance directive- d/w pt.  Daughter and son equally designated if patient were incapacitated.  Cognitive function addressed- see scanned forms- and if abnormal then additional documentation follows.   HLD.  Intolerant of statin. D/w pt about diet and exercise.  Given her reaction to statin and her preference not to start another med, then reasonable to continue with diet and exercise, d/w pt about that along with labs.  She agrrees.    Back pain, with B leg pain.  Off statin.  Still on baseline meds.  No ADE on meds.  Still with back pain since last surgery per patient report.  Offered referral re: back pain.  Declined.  She wants to avoid surgery.  I told her I didn't have much else to offer.  "I can get by."    PMH and SH reviewed  Meds, vitals, and allergies  reviewed.   ROS: See HPI.  Otherwise negative.    GEN: nad, alert and oriented HEENT: mucous membranes moist NECK: supple w/o LA CV: rrr. PULM: ctab, no inc wob ABD: soft, +bs EXT: no edema SKIN: no acute rash Lower back ttp, at baseline.  Symmetric gait with S/S grossly wnl BLE

## 2015-02-25 NOTE — Assessment & Plan Note (Signed)
Flu 2016 Shingles 20132 PNA 2015 Tetanus 2013 Colonoscopy 2008 Breast cancer screening- done 2016, encouraged routine f/u.  DXA done 2016 Advance directive- d/w pt.  Daughter and son equally designated if patient were incapacitated.  Cognitive function addressed- see scanned forms- and if abnormal then additional documentation follows.

## 2015-02-25 NOTE — Assessment & Plan Note (Signed)
Las d/w pt.  She didn't tolerate oral vit D replacement. rec inc in dairy intake to help.  She agrees.

## 2015-03-18 ENCOUNTER — Other Ambulatory Visit: Payer: Self-pay | Admitting: *Deleted

## 2015-03-18 MED ORDER — HYDROCODONE-ACETAMINOPHEN 5-325 MG PO TABS: ORAL_TABLET | ORAL | Status: DC

## 2015-03-18 NOTE — Telephone Encounter (Signed)
Pt left voicemail at Triage, requesting refill of Rx, last refilled on 02/17/15 #120 with 0 refills

## 2015-03-18 NOTE — Telephone Encounter (Signed)
Printed.  Thanks.  

## 2015-03-18 NOTE — Telephone Encounter (Signed)
Patient advised.  Rx left at front desk for pick up. 

## 2015-04-13 ENCOUNTER — Other Ambulatory Visit: Payer: Self-pay | Admitting: *Deleted

## 2015-04-13 MED ORDER — GABAPENTIN 800 MG PO TABS: 800.0000 mg | ORAL_TABLET | Freq: Three times a day (TID) | ORAL | Status: DC

## 2015-04-13 NOTE — Telephone Encounter (Signed)
Sent. Thanks.   

## 2015-04-13 NOTE — Telephone Encounter (Signed)
Faxed refill request. Last Filled:    270 tablet 0 01/13/2015  Last office visit:   02/25/15  [facility]   Please advise.

## 2015-04-19 ENCOUNTER — Other Ambulatory Visit: Payer: Self-pay

## 2015-04-19 NOTE — Telephone Encounter (Signed)
Pt left v/m requesting rx hydrocodone apap. Call when ready for pick up. rx last printed # 120 on 03/18/15; pt last annual exam on 02/25/15.

## 2015-04-20 MED ORDER — HYDROCODONE-ACETAMINOPHEN 5-325 MG PO TABS: ORAL_TABLET | ORAL | Status: DC

## 2015-04-20 NOTE — Telephone Encounter (Signed)
Patient advised.  Rx left at front desk for pick up. 

## 2015-04-20 NOTE — Telephone Encounter (Signed)
Printed.  Thanks.  

## 2015-05-18 ENCOUNTER — Other Ambulatory Visit: Payer: Self-pay | Admitting: *Deleted

## 2015-05-18 NOTE — Telephone Encounter (Signed)
Pt left voicemail at Triage requesting refill of med. Last refilled on 04/20/15 #120 with 0 refills

## 2015-05-19 MED ORDER — HYDROCODONE-ACETAMINOPHEN 5-325 MG PO TABS: ORAL_TABLET | ORAL | Status: DC

## 2015-05-19 NOTE — Telephone Encounter (Signed)
Patient notified by telephone that script is up front ready for pickup. 

## 2015-05-19 NOTE — Telephone Encounter (Signed)
Printed.  Thanks.  

## 2015-06-18 ENCOUNTER — Other Ambulatory Visit: Payer: Self-pay

## 2015-06-18 MED ORDER — HYDROCODONE-ACETAMINOPHEN 5-325 MG PO TABS: ORAL_TABLET | ORAL | Status: DC

## 2015-06-18 NOTE — Telephone Encounter (Signed)
Left detailed message on voicemail. Rx left at front desk for pick up.  

## 2015-06-18 NOTE — Telephone Encounter (Signed)
Pt left v/m requesting rx hydrocodone apap. Call when ready for pick up. Last printed # 120 on 05/19/15. Last annual 02/25/15.

## 2015-06-18 NOTE — Telephone Encounter (Signed)
Printed.  Thanks.  

## 2015-06-21 ENCOUNTER — Other Ambulatory Visit: Payer: Self-pay | Admitting: Family Medicine

## 2015-07-13 ENCOUNTER — Other Ambulatory Visit: Payer: Self-pay | Admitting: Family Medicine

## 2015-07-13 NOTE — Telephone Encounter (Signed)
Electronic refill request. Last Filled:    270 tablet 0 04/13/2015  Last office visit:   02/25/2015.  Please advise.

## 2015-07-14 NOTE — Telephone Encounter (Signed)
Sent. Thanks.   

## 2015-07-19 ENCOUNTER — Other Ambulatory Visit: Payer: Self-pay

## 2015-07-19 MED ORDER — HYDROCODONE-ACETAMINOPHEN 5-325 MG PO TABS: ORAL_TABLET | ORAL | Status: DC

## 2015-07-19 NOTE — Telephone Encounter (Signed)
Printed.  Thanks.  

## 2015-07-19 NOTE — Telephone Encounter (Signed)
Pt left v/m requesting rx hydrocodone apap. Call when ready for pick up. Last printed # 120 on 06/18/15;last annual 02/25/15.

## 2015-07-20 NOTE — Telephone Encounter (Signed)
Patient advised.  Rx left at front desk for pick up. 

## 2015-08-18 ENCOUNTER — Other Ambulatory Visit: Payer: Self-pay

## 2015-08-18 NOTE — Telephone Encounter (Signed)
Last filled on 07/19/2015

## 2015-08-18 NOTE — Telephone Encounter (Signed)
Last filled on 07/19/15, last OV 02/25/15. Ok to refill?

## 2015-08-19 MED ORDER — HYDROCODONE-ACETAMINOPHEN 5-325 MG PO TABS: ORAL_TABLET | ORAL | 0 refills | Status: DC

## 2015-08-19 NOTE — Telephone Encounter (Signed)
Printed.  Thanks.  

## 2015-08-19 NOTE — Telephone Encounter (Signed)
Left detailed message on voicemail. Rx left at front desk for pick up.  

## 2015-09-14 ENCOUNTER — Other Ambulatory Visit: Payer: Self-pay

## 2015-09-14 NOTE — Telephone Encounter (Signed)
Pt left v/m requesting cb; left v/m requesting pt to cb.

## 2015-09-14 NOTE — Telephone Encounter (Signed)
Pt left v/m requesting rx hydrocodone apap. Call when ready for pick up. Last printed # 120 on 08/19/15 and last annual 02/25/15. Pt is going out of town on 09/17/15 - 09/26/15 and pt request to pick up rx early.

## 2015-09-15 MED ORDER — HYDROCODONE-ACETAMINOPHEN 5-325 MG PO TABS: ORAL_TABLET | ORAL | 0 refills | Status: DC

## 2015-09-15 NOTE — Telephone Encounter (Signed)
Printed.  Thanks.  

## 2015-09-15 NOTE — Telephone Encounter (Signed)
Patient notified by telephone that script is up front ready for pickup. 

## 2015-09-27 ENCOUNTER — Ambulatory Visit (INDEPENDENT_AMBULATORY_CARE_PROVIDER_SITE_OTHER): Payer: PPO | Admitting: Family Medicine

## 2015-09-27 ENCOUNTER — Encounter: Payer: Self-pay | Admitting: Family Medicine

## 2015-09-27 DIAGNOSIS — J439 Emphysema, unspecified: Secondary | ICD-10-CM | POA: Diagnosis not present

## 2015-09-27 MED ORDER — DOXYCYCLINE HYCLATE 100 MG PO TABS: 100.0000 mg | ORAL_TABLET | Freq: Two times a day (BID) | ORAL | 0 refills | Status: DC

## 2015-09-27 MED ORDER — ALBUTEROL SULFATE HFA 108 (90 BASE) MCG/ACT IN AERS: 2.0000 | INHALATION_SPRAY | Freq: Four times a day (QID) | RESPIRATORY_TRACT | 0 refills | Status: DC | PRN

## 2015-09-27 NOTE — Progress Notes (Signed)
Pre visit review using our clinic review tool, if applicable. No additional management support is needed unless otherwise documented below in the visit note. 

## 2015-09-27 NOTE — Patient Instructions (Signed)
Start the antibiotics and try to get some rest.   Use the inhaler if needed, if wheezing.  Take care.  Glad to see you.  Update [place] as needed.

## 2015-09-27 NOTE — Progress Notes (Signed)
Sx started about 1 week.  Sputum, cough.  No fevers.  No ear pain.  No ST.  No facial pain.  Mainly chest sx- no upper airway sx.  No vomiting, no diarrhea.  No abd pain. No wheeze.    Meds, vitals, and allergies reviewed.  ROS: Per HPI unless specifically indicated in ROS section  GEN: nad, alert and oriented HEENT: mucous membranes moist, tm w/o erythema, nasal exam w/o erythema, clear discharge noted,  OP with cobblestoning NECK: supple w/o LA CV: rrr.   PULM: ctab, no inc wob, cough noted.  EXT: no edema SKIN: no acute rash

## 2015-09-28 NOTE — Assessment & Plan Note (Signed)
Likely exacerbation. Nontoxic. Okay for outpatient follow-up. Speaking in complete sentences. Can use albuterol as needed. Start doxycycline with routine cautions in the meantime. She agrees.

## 2015-10-04 ENCOUNTER — Telehealth: Payer: Self-pay

## 2015-10-04 NOTE — Telephone Encounter (Signed)
Is she worse? Is she using inhaler? Any sputum at this point?  Any fevers? Let me know.  Thanks .

## 2015-10-04 NOTE — Telephone Encounter (Signed)
I would add on the inhaler to see if that will help.  Thanks.

## 2015-10-04 NOTE — Telephone Encounter (Signed)
Patient says she is not worse but not any better.  She is not using the inhaler because she says she is not having trouble breathing.  I explained that the inhaler would help to loosen things in her chest.  Patient states she has never used an inhaler before and thought that's what the antibiotic was for.  Patient has not been running fevers and produces only a small amount of sputum which is either gray or green.

## 2015-10-04 NOTE — Telephone Encounter (Signed)
Patient advised.

## 2015-10-04 NOTE — Telephone Encounter (Signed)
Pt was seen on 09/27/15 she was prescribed doxycycline. She's not feeling any better and would like to know what to do?

## 2015-10-18 ENCOUNTER — Other Ambulatory Visit: Payer: Self-pay

## 2015-10-18 MED ORDER — HYDROCODONE-ACETAMINOPHEN 5-325 MG PO TABS: ORAL_TABLET | ORAL | 0 refills | Status: DC

## 2015-10-18 NOTE — Telephone Encounter (Signed)
Printed.  Thanks.  

## 2015-10-18 NOTE — Telephone Encounter (Signed)
Pt left v/m requesting rx hydrocodone apap. Call when ready for pick up. Last printed # 120 on 09/15/15. Last seen 09/27/15.

## 2015-10-19 NOTE — Telephone Encounter (Signed)
Patient advised.  Rx left at front desk for pick up. 

## 2015-11-17 ENCOUNTER — Other Ambulatory Visit: Payer: Self-pay

## 2015-11-17 MED ORDER — HYDROCODONE-ACETAMINOPHEN 5-325 MG PO TABS: ORAL_TABLET | ORAL | 0 refills | Status: DC

## 2015-11-17 NOTE — Telephone Encounter (Signed)
Printed.  Thanks.  

## 2015-11-17 NOTE — Telephone Encounter (Signed)
Pt left v/m requesting rx hydrocodone apap. Call when ready for pick up. rx last printed # 120 on 10/18/15. Last annual 02/25/15.

## 2015-11-17 NOTE — Telephone Encounter (Signed)
Called and notified patient that Rx is ready for pick up.

## 2015-12-14 ENCOUNTER — Encounter: Payer: Self-pay | Admitting: Family Medicine

## 2015-12-17 ENCOUNTER — Other Ambulatory Visit: Payer: Self-pay

## 2015-12-17 NOTE — Telephone Encounter (Signed)
Pt left v/m requesting rx hydrocodone apap. Call when ready for pick up. rx last printed #120 on 11/17/15. Pt last annual 02/25/15.

## 2015-12-19 MED ORDER — HYDROCODONE-ACETAMINOPHEN 5-325 MG PO TABS: ORAL_TABLET | ORAL | 0 refills | Status: DC

## 2015-12-19 NOTE — Telephone Encounter (Signed)
Printed.  Thanks.  

## 2015-12-20 NOTE — Telephone Encounter (Signed)
Patient advised.  Rx left at front desk for pick up. 

## 2016-01-03 ENCOUNTER — Other Ambulatory Visit: Payer: Self-pay | Admitting: Family Medicine

## 2016-01-03 DIAGNOSIS — I6529 Occlusion and stenosis of unspecified carotid artery: Secondary | ICD-10-CM

## 2016-01-03 NOTE — Progress Notes (Signed)
Due for f/u carotid u/s.  Ordered.  Thanks.  Patient advised.   Mike Craze, CMA  01/04/16.

## 2016-01-10 ENCOUNTER — Other Ambulatory Visit: Payer: Self-pay | Admitting: Family Medicine

## 2016-01-10 NOTE — Telephone Encounter (Signed)
Electronic refill request. Last Filled:    270 tablet 1 07/14/2015  Last office visit:   09/27/15  Please advise.

## 2016-01-11 NOTE — Telephone Encounter (Signed)
Sent. Thanks.   

## 2016-01-18 ENCOUNTER — Other Ambulatory Visit: Payer: Self-pay | Admitting: *Deleted

## 2016-01-18 DIAGNOSIS — G8929 Other chronic pain: Secondary | ICD-10-CM

## 2016-01-18 HISTORY — DX: Other chronic pain: G89.29

## 2016-01-18 MED ORDER — HYDROCODONE-ACETAMINOPHEN 5-325 MG PO TABS: ORAL_TABLET | ORAL | 0 refills | Status: DC

## 2016-01-18 NOTE — Telephone Encounter (Signed)
Indication for chronic opioid: chronic lower back pain Medication and dose: hydrocodone 5/325mg  # pills per month: 120 Last UDS date: 10/19/14 Pain contract signed (Y/N): yes Date narcotic database last reviewed (include red flags): 01/18/16   Printed.  Thanks.

## 2016-01-18 NOTE — Telephone Encounter (Signed)
Patient left a voicemail requesting a refill on Hydrocodone Last refill 12/19/15 #120 Last office visit 09/27/15

## 2016-01-18 NOTE — Telephone Encounter (Signed)
Patient advised.  Rx left at front desk for pick up. 

## 2016-01-18 NOTE — Assessment & Plan Note (Signed)
Indication for chronic opioid: chronic lower back pain Medication and dose: hydrocodone 5/325mg  # pills per month: 120 Last UDS date: 10/19/14 Pain contract signed (Y/N): yes Date narcotic database last reviewed (include red flags): 01/18/16

## 2016-01-26 ENCOUNTER — Ambulatory Visit: Payer: PPO

## 2016-01-26 DIAGNOSIS — I6529 Occlusion and stenosis of unspecified carotid artery: Secondary | ICD-10-CM

## 2016-01-26 DIAGNOSIS — I6523 Occlusion and stenosis of bilateral carotid arteries: Secondary | ICD-10-CM | POA: Diagnosis not present

## 2016-02-16 ENCOUNTER — Other Ambulatory Visit: Payer: Self-pay

## 2016-02-16 MED ORDER — HYDROCODONE-ACETAMINOPHEN 5-325 MG PO TABS: ORAL_TABLET | ORAL | 0 refills | Status: DC

## 2016-02-16 NOTE — Telephone Encounter (Signed)
Pt left v/m requesting rx hydrocodone apap. Call when ready for pick up. rx last printed # 120 on 01/18/16. Pt last annual 02/25/15. No future appt scheduled.

## 2016-02-16 NOTE — Telephone Encounter (Signed)
No suspicious activity on  controlled substance registry. Printed Rx for 1 month supply.

## 2016-02-16 NOTE — Telephone Encounter (Signed)
Notified patient and Rx is ready for pick up.

## 2016-03-15 ENCOUNTER — Other Ambulatory Visit: Payer: Self-pay

## 2016-03-15 MED ORDER — HYDROCODONE-ACETAMINOPHEN 5-325 MG PO TABS: ORAL_TABLET | ORAL | 0 refills | Status: DC

## 2016-03-15 NOTE — Telephone Encounter (Signed)
Pt left v/m requesting rx hydrocodone apap. Call when ready for pick up. Last printed # 120 on 02/16/16. Last annual 02/25/15 and last acute 09/27/15. No future appt scheduled.

## 2016-03-15 NOTE — Telephone Encounter (Signed)
Printed.  Thanks.  Due for CPE, if not already scheduled.

## 2016-03-15 NOTE — Telephone Encounter (Signed)
Patient notified by telephone that script is up front ready for pickup. Patient stated that she will schedule her physical when she comes in to pick the script up.

## 2016-03-18 ENCOUNTER — Ambulatory Visit (HOSPITAL_COMMUNITY)
Admission: EM | Admit: 2016-03-18 | Discharge: 2016-03-18 | Disposition: A | Discharge status: Home / Self Care | Payer: PPO | Attending: Family Medicine | Admitting: Family Medicine

## 2016-03-18 ENCOUNTER — Encounter (HOSPITAL_COMMUNITY): Payer: Self-pay | Admitting: Emergency Medicine

## 2016-03-18 DIAGNOSIS — J181 Lobar pneumonia, unspecified organism: Secondary | ICD-10-CM

## 2016-03-18 DIAGNOSIS — J189 Pneumonia, unspecified organism: Secondary | ICD-10-CM

## 2016-03-18 MED ORDER — DOXYCYCLINE HYCLATE 100 MG PO CAPS: 100.0000 mg | ORAL_CAPSULE | Freq: Two times a day (BID) | ORAL | 0 refills | Status: DC
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
## 2016-03-18 NOTE — Discharge Instructions (Signed)
Continue to push fluids, practice good hand hygiene, and cover your mouth if you cough.  If you start having fevers, shaking or worsening/un-improving shortness of breath, seek immediate care.  Take the antibiotic with some food.

## 2016-03-18 NOTE — ED Triage Notes (Addendum)
See s/s.  Symptoms started 3/12.  Patient has had diarrhea.  Small amount of diarrhea.  Feeling bad in general.  Poor appetite

## 2016-03-19 ENCOUNTER — Emergency Department (HOSPITAL_COMMUNITY): Payer: PPO

## 2016-03-19 ENCOUNTER — Inpatient Hospital Stay (HOSPITAL_COMMUNITY)
Admission: EM | Admit: 2016-03-19 | Discharge: 2016-03-23 | DRG: 190 | Disposition: A | Discharge status: Home / Self Care | Payer: PPO | Attending: Internal Medicine | Admitting: Internal Medicine

## 2016-03-19 ENCOUNTER — Encounter (HOSPITAL_COMMUNITY): Payer: Self-pay | Admitting: Emergency Medicine

## 2016-03-19 DIAGNOSIS — Z888 Allergy status to other drugs, medicaments and biological substances status: Secondary | ICD-10-CM | POA: Diagnosis not present

## 2016-03-19 DIAGNOSIS — Z90710 Acquired absence of both cervix and uterus: Secondary | ICD-10-CM

## 2016-03-19 DIAGNOSIS — Z23 Encounter for immunization: Secondary | ICD-10-CM | POA: Diagnosis not present

## 2016-03-19 DIAGNOSIS — J441 Chronic obstructive pulmonary disease with (acute) exacerbation: Secondary | ICD-10-CM | POA: Diagnosis not present

## 2016-03-19 DIAGNOSIS — Z885 Allergy status to narcotic agent status: Secondary | ICD-10-CM

## 2016-03-19 DIAGNOSIS — E785 Hyperlipidemia, unspecified: Secondary | ICD-10-CM | POA: Diagnosis not present

## 2016-03-19 DIAGNOSIS — Z87891 Personal history of nicotine dependence: Secondary | ICD-10-CM

## 2016-03-19 DIAGNOSIS — Z833 Family history of diabetes mellitus: Secondary | ICD-10-CM

## 2016-03-19 DIAGNOSIS — J9600 Acute respiratory failure, unspecified whether with hypoxia or hypercapnia: Secondary | ICD-10-CM | POA: Diagnosis present

## 2016-03-19 DIAGNOSIS — J9811 Atelectasis: Secondary | ICD-10-CM | POA: Diagnosis not present

## 2016-03-19 DIAGNOSIS — F411 Generalized anxiety disorder: Secondary | ICD-10-CM | POA: Diagnosis present

## 2016-03-19 DIAGNOSIS — Z8249 Family history of ischemic heart disease and other diseases of the circulatory system: Secondary | ICD-10-CM

## 2016-03-19 DIAGNOSIS — J101 Influenza due to other identified influenza virus with other respiratory manifestations: Secondary | ICD-10-CM

## 2016-03-19 DIAGNOSIS — J9601 Acute respiratory failure with hypoxia: Secondary | ICD-10-CM | POA: Diagnosis present

## 2016-03-19 DIAGNOSIS — M549 Dorsalgia, unspecified: Secondary | ICD-10-CM | POA: Diagnosis present

## 2016-03-19 DIAGNOSIS — I700 Atherosclerosis of aorta: Secondary | ICD-10-CM | POA: Diagnosis not present

## 2016-03-19 DIAGNOSIS — Z79899 Other long term (current) drug therapy: Secondary | ICD-10-CM

## 2016-03-19 DIAGNOSIS — Z85828 Personal history of other malignant neoplasm of skin: Secondary | ICD-10-CM | POA: Diagnosis not present

## 2016-03-19 DIAGNOSIS — Z825 Family history of asthma and other chronic lower respiratory diseases: Secondary | ICD-10-CM | POA: Diagnosis not present

## 2016-03-19 DIAGNOSIS — J439 Emphysema, unspecified: Secondary | ICD-10-CM | POA: Diagnosis present

## 2016-03-19 DIAGNOSIS — G8929 Other chronic pain: Secondary | ICD-10-CM | POA: Diagnosis not present

## 2016-03-19 DIAGNOSIS — J111 Influenza due to unidentified influenza virus with other respiratory manifestations: Secondary | ICD-10-CM | POA: Diagnosis present

## 2016-03-19 DIAGNOSIS — R0602 Shortness of breath: Secondary | ICD-10-CM | POA: Diagnosis not present

## 2016-03-19 LAB — CBC WITH DIFFERENTIAL/PLATELET
BASOS PCT: 0 %
Basophils Absolute: 0 10*3/uL (ref 0.0–0.1)
EOS PCT: 1 %
Eosinophils Absolute: 0.1 10*3/uL (ref 0.0–0.7)
HEMATOCRIT: 40.8 % (ref 36.0–46.0)
Hemoglobin: 13.5 g/dL (ref 12.0–15.0)
Lymphocytes Relative: 45 %
Lymphs Abs: 2.4 10*3/uL (ref 0.7–4.0)
MCH: 29.9 pg (ref 26.0–34.0)
MCHC: 33.1 g/dL (ref 30.0–36.0)
MCV: 90.5 fL (ref 78.0–100.0)
MONO ABS: 0.7 10*3/uL (ref 0.1–1.0)
MONOS PCT: 13 %
NEUTROS PCT: 41 %
Neutro Abs: 2.2 10*3/uL (ref 1.7–7.7)
PLATELETS: 220 10*3/uL (ref 150–400)
RBC: 4.51 MIL/uL (ref 3.87–5.11)
RDW: 12 % (ref 11.5–15.5)
WBC: 5.4 10*3/uL (ref 4.0–10.5)

## 2016-03-19 LAB — BASIC METABOLIC PANEL
Anion gap: 13 (ref 5–15)
BUN: 11 mg/dL (ref 6–20)
CHLORIDE: 97 mmol/L — AB (ref 101–111)
CO2: 27 mmol/L (ref 22–32)
Calcium: 9.2 mg/dL (ref 8.9–10.3)
Creatinine, Ser: 0.7 mg/dL (ref 0.44–1.00)
GFR calc Af Amer: >60 mL/min (ref >60)
GLUCOSE: 95 mg/dL (ref 65–99)
POTASSIUM: 4.1 mmol/L (ref 3.5–5.1)
Sodium: 137 mmol/L (ref 135–145)

## 2016-03-19 LAB — INFLUENZA PANEL BY PCR (TYPE A & B)
Influenza A By PCR: NEGATIVE
Influenza B By PCR: POSITIVE — AB

## 2016-03-19 LAB — I-STAT CG4 LACTIC ACID, ED: Lactic Acid, Venous: 1.44 mmol/L (ref 0.5–1.9)

## 2016-03-19 MED ORDER — ALBUTEROL SULFATE (2.5 MG/3ML) 0.083% IN NEBU: 2.5000 mg | INHALATION_SOLUTION | Freq: Four times a day (QID) | RESPIRATORY_TRACT | Status: DC | PRN

## 2016-03-19 MED ORDER — ACETAMINOPHEN 325 MG PO TABS: 650.0000 mg | ORAL_TABLET | Freq: Four times a day (QID) | ORAL | Status: DC | PRN

## 2016-03-19 MED ORDER — ONDANSETRON HCL 4 MG/2ML IJ SOLN
4.0000 mg | INTRAMUSCULAR | Status: DC | PRN
  Administered 2016-03-19: 4 mg via INTRAVENOUS
  Filled 2016-03-19 (×2): qty 2

## 2016-03-19 MED ORDER — METHYLPREDNISOLONE SODIUM SUCC 125 MG IJ SOLR
60.0000 mg | Freq: Four times a day (QID) | INTRAMUSCULAR | Status: DC
  Administered 2016-03-19 – 2016-03-20 (×4): 60 mg via INTRAVENOUS
  Filled 2016-03-19 (×5): qty 2

## 2016-03-19 MED ORDER — OSELTAMIVIR PHOSPHATE 75 MG PO CAPS
75.0000 mg | ORAL_CAPSULE | Freq: Once | ORAL | Status: AC
  Administered 2016-03-19: 75 mg via ORAL
  Filled 2016-03-19: qty 1

## 2016-03-19 MED ORDER — GABAPENTIN 800 MG PO TABS
800.0000 mg | ORAL_TABLET | Freq: Three times a day (TID) | ORAL | Status: DC
  Filled 2016-03-19 (×2): qty 1

## 2016-03-19 MED ORDER — IPRATROPIUM-ALBUTEROL 0.5-2.5 (3) MG/3ML IN SOLN
3.0000 mL | Freq: Three times a day (TID) | RESPIRATORY_TRACT | Status: DC
  Administered 2016-03-20 – 2016-03-21 (×3): 3 mL via RESPIRATORY_TRACT
  Filled 2016-03-19 (×4): qty 3

## 2016-03-19 MED ORDER — SODIUM CHLORIDE 0.9 % IV SOLN
INTRAVENOUS | Status: AC
  Administered 2016-03-19: 11:00:00 via INTRAVENOUS

## 2016-03-19 MED ORDER — ONDANSETRON HCL 4 MG PO TABS: 4.0000 mg | ORAL_TABLET | Freq: Four times a day (QID) | ORAL | Status: DC | PRN

## 2016-03-19 MED ORDER — GABAPENTIN 400 MG PO CAPS
800.0000 mg | ORAL_CAPSULE | Freq: Three times a day (TID) | ORAL | Status: DC
  Administered 2016-03-19 – 2016-03-20 (×3): 800 mg via ORAL
  Filled 2016-03-19 (×3): qty 2

## 2016-03-19 MED ORDER — IPRATROPIUM-ALBUTEROL 0.5-2.5 (3) MG/3ML IN SOLN
3.0000 mL | RESPIRATORY_TRACT | Status: DC
  Administered 2016-03-19 (×2): 3 mL via RESPIRATORY_TRACT
  Filled 2016-03-19 (×2): qty 3

## 2016-03-19 MED ORDER — METHYLPREDNISOLONE SODIUM SUCC 125 MG IJ SOLR
125.0000 mg | Freq: Once | INTRAMUSCULAR | Status: AC
  Administered 2016-03-19: 125 mg via INTRAVENOUS
  Filled 2016-03-19: qty 2

## 2016-03-19 MED ORDER — HYDROCODONE-ACETAMINOPHEN 5-325 MG PO TABS
1.0000 | ORAL_TABLET | Freq: Three times a day (TID) | ORAL | Status: DC | PRN
  Administered 2016-03-19 – 2016-03-20 (×3): 1 via ORAL
  Filled 2016-03-19 (×3): qty 1

## 2016-03-19 MED ORDER — IOPAMIDOL (ISOVUE-300) INJECTION 61%
INTRAVENOUS | Status: AC
  Administered 2016-03-19: 75 mL
  Filled 2016-03-19: qty 75

## 2016-03-19 MED ORDER — ONDANSETRON 4 MG PO TBDP
ORAL_TABLET | ORAL | Status: AC
  Filled 2016-03-19: qty 1

## 2016-03-19 MED ORDER — ONDANSETRON HCL 4 MG/2ML IJ SOLN: 4.0000 mg | Freq: Four times a day (QID) | INTRAMUSCULAR | Status: DC | PRN

## 2016-03-19 MED ORDER — BISACODYL 5 MG PO TBEC
5.0000 mg | DELAYED_RELEASE_TABLET | Freq: Every day | ORAL | Status: DC | PRN
  Filled 2016-03-19: qty 1

## 2016-03-19 MED ORDER — SODIUM CHLORIDE 0.9% FLUSH
3.0000 mL | Freq: Two times a day (BID) | INTRAVENOUS | Status: DC
  Administered 2016-03-19 – 2016-03-23 (×8): 3 mL via INTRAVENOUS

## 2016-03-19 MED ORDER — ONDANSETRON HCL 4 MG PO TABS
4.0000 mg | ORAL_TABLET | Freq: Four times a day (QID) | ORAL | Status: DC | PRN
Start: 2016-03-19 — End: 2016-03-19

## 2016-03-19 MED ORDER — IBUPROFEN-DIPHENHYDRAMINE HCL 200-25 MG PO CAPS: 1.0000 | ORAL_CAPSULE | Freq: Every evening | ORAL | Status: DC | PRN

## 2016-03-19 MED ORDER — DIPHENHYDRAMINE HCL 25 MG PO CAPS: 25.0000 mg | ORAL_CAPSULE | Freq: Four times a day (QID) | ORAL | Status: DC | PRN

## 2016-03-19 MED ORDER — ALBUTEROL SULFATE (2.5 MG/3ML) 0.083% IN NEBU: 2.5000 mg | INHALATION_SOLUTION | RESPIRATORY_TRACT | Status: DC | PRN

## 2016-03-19 MED ORDER — ENOXAPARIN SODIUM 40 MG/0.4ML ~~LOC~~ SOLN
40.0000 mg | SUBCUTANEOUS | Status: DC
  Administered 2016-03-19 – 2016-03-21 (×3): 40 mg via SUBCUTANEOUS
  Filled 2016-03-19 (×3): qty 0.4

## 2016-03-19 MED ORDER — ACETAMINOPHEN 650 MG RE SUPP: 650.0000 mg | Freq: Four times a day (QID) | RECTAL | Status: DC | PRN

## 2016-03-19 MED ORDER — IPRATROPIUM-ALBUTEROL 0.5-2.5 (3) MG/3ML IN SOLN
3.0000 mL | Freq: Four times a day (QID) | RESPIRATORY_TRACT | Status: DC
  Administered 2016-03-19: 3 mL via RESPIRATORY_TRACT
  Filled 2016-03-19: qty 3

## 2016-03-19 MED ORDER — OSELTAMIVIR PHOSPHATE 30 MG PO CAPS
30.0000 mg | ORAL_CAPSULE | Freq: Two times a day (BID) | ORAL | Status: AC
  Administered 2016-03-19 – 2016-03-22 (×7): 30 mg via ORAL
  Filled 2016-03-19 (×8): qty 1

## 2016-03-19 MED ORDER — LEVOFLOXACIN 500 MG PO TABS
500.0000 mg | ORAL_TABLET | Freq: Every day | ORAL | Status: DC
Start: 2016-03-19 — End: 2016-03-23
  Administered 2016-03-19 – 2016-03-23 (×5): 500 mg via ORAL
  Filled 2016-03-19 (×5): qty 1

## 2016-03-19 MED ORDER — ONDANSETRON 4 MG PO TBDP
4.0000 mg | ORAL_TABLET | Freq: Once | ORAL | Status: AC
  Administered 2016-03-19: 4 mg via ORAL

## 2016-03-19 MED ORDER — SODIUM CHLORIDE 0.9 % IV BOLUS (SEPSIS)
500.0000 mL | Freq: Once | INTRAVENOUS | Status: AC
  Administered 2016-03-19: 500 mL via INTRAVENOUS

## 2016-03-19 MED ORDER — GUAIFENESIN ER 600 MG PO TB12
600.0000 mg | ORAL_TABLET | Freq: Two times a day (BID) | ORAL | Status: DC
  Administered 2016-03-19 – 2016-03-23 (×8): 600 mg via ORAL
  Filled 2016-03-19 (×8): qty 1

## 2016-03-19 MED ORDER — ONDANSETRON HCL 4 MG/2ML IJ SOLN
4.0000 mg | Freq: Once | INTRAMUSCULAR | Status: AC
  Administered 2016-03-19: 4 mg via INTRAVENOUS
  Filled 2016-03-19: qty 2

## 2016-03-19 NOTE — Progress Notes (Signed)
RT note: Rt instructed patient use of flutter valve, using the teach back method. Patient with good effort had good productive cough.
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
## 2016-03-19 NOTE — ED Notes (Signed)
Pt ambulated to restroom with stand by assistance

## 2016-03-19 NOTE — Progress Notes (Signed)
Pt c/o nausea. MD made aware.
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
## 2016-03-19 NOTE — ED Notes (Signed)
MD Jeneen Rinks at bedside. Pts family wishes for her to go home.

## 2016-03-19 NOTE — ED Notes (Signed)
Family at bedside. 

## 2016-03-19 NOTE — ED Notes (Signed)
Pt. Transported to CT 

## 2016-03-19 NOTE — ED Triage Notes (Signed)
Pt was seen yesterday for c/o SOB on UC diagnosed with PNA ans sent home on PO abx, pt took 2 doses of the abx now she is c/o fever at home and nausea no able to eat or drink.

## 2016-03-19 NOTE — Progress Notes (Signed)
PHARMACY NOTE:  ANTIMICROBIAL RENAL DOSAGE ADJUSTMENT  Current antimicrobial regimen includes a mismatch between antimicrobial dosage and estimated renal function.  As per policy approved by the Pharmacy & Therapeutics and Medical Executive Committees, the antimicrobial dosage will be adjusted accordingly.  Current antimicrobial dosage:  Tamiflu 75 mg po daily  Indication: flu tx  Renal Function:  Estimated Creatinine Clearance: 47.1 mL/min (by C-G formula based on SCr of 0.7 mg/dL). []      On intermittent HD, scheduled: []      On CRRT    Antimicrobial dosage has been changed to:  30 mg po BID  dditional comments: Changed at order verification   Thank you for allowing pharmacy to be a part of this patient's care.  Carlean Jews, Pharm.D. PGY1 Pharmacy Resident 3/18/201812:39 PM

## 2016-03-19 NOTE — ED Notes (Signed)
Triage called and states blood was in triage ben at 0700 and sent to the lab.

## 2016-03-20 ENCOUNTER — Telehealth: Payer: Self-pay

## 2016-03-20 DIAGNOSIS — Z79899 Other long term (current) drug therapy: Secondary | ICD-10-CM | POA: Diagnosis not present

## 2016-03-20 DIAGNOSIS — J9601 Acute respiratory failure with hypoxia: Secondary | ICD-10-CM | POA: Diagnosis not present

## 2016-03-20 DIAGNOSIS — J111 Influenza due to unidentified influenza virus with other respiratory manifestations: Secondary | ICD-10-CM

## 2016-03-20 DIAGNOSIS — J441 Chronic obstructive pulmonary disease with (acute) exacerbation: Secondary | ICD-10-CM | POA: Diagnosis not present

## 2016-03-20 DIAGNOSIS — E785 Hyperlipidemia, unspecified: Secondary | ICD-10-CM | POA: Diagnosis not present

## 2016-03-20 DIAGNOSIS — G8929 Other chronic pain: Secondary | ICD-10-CM | POA: Diagnosis not present

## 2016-03-20 DIAGNOSIS — Z85828 Personal history of other malignant neoplasm of skin: Secondary | ICD-10-CM | POA: Diagnosis not present

## 2016-03-20 DIAGNOSIS — Z23 Encounter for immunization: Secondary | ICD-10-CM | POA: Diagnosis not present

## 2016-03-20 DIAGNOSIS — J449 Chronic obstructive pulmonary disease, unspecified: Secondary | ICD-10-CM | POA: Diagnosis not present

## 2016-03-20 DIAGNOSIS — Z90710 Acquired absence of both cervix and uterus: Secondary | ICD-10-CM | POA: Diagnosis not present

## 2016-03-20 DIAGNOSIS — Z87891 Personal history of nicotine dependence: Secondary | ICD-10-CM | POA: Diagnosis not present

## 2016-03-20 DIAGNOSIS — Z8249 Family history of ischemic heart disease and other diseases of the circulatory system: Secondary | ICD-10-CM | POA: Diagnosis not present

## 2016-03-20 DIAGNOSIS — Z825 Family history of asthma and other chronic lower respiratory diseases: Secondary | ICD-10-CM | POA: Diagnosis not present

## 2016-03-20 DIAGNOSIS — Z885 Allergy status to narcotic agent status: Secondary | ICD-10-CM | POA: Diagnosis not present

## 2016-03-20 DIAGNOSIS — Z833 Family history of diabetes mellitus: Secondary | ICD-10-CM | POA: Diagnosis not present

## 2016-03-20 DIAGNOSIS — J101 Influenza due to other identified influenza virus with other respiratory manifestations: Secondary | ICD-10-CM | POA: Diagnosis not present

## 2016-03-20 DIAGNOSIS — Z888 Allergy status to other drugs, medicaments and biological substances status: Secondary | ICD-10-CM | POA: Diagnosis not present

## 2016-03-20 DIAGNOSIS — F411 Generalized anxiety disorder: Secondary | ICD-10-CM | POA: Diagnosis not present

## 2016-03-20 DIAGNOSIS — J439 Emphysema, unspecified: Secondary | ICD-10-CM | POA: Diagnosis not present

## 2016-03-20 DIAGNOSIS — M549 Dorsalgia, unspecified: Secondary | ICD-10-CM | POA: Diagnosis not present

## 2016-03-20 LAB — CBC
HCT: 38.4 % (ref 36.0–46.0)
Hemoglobin: 13 g/dL (ref 12.0–15.0)
MCH: 29.7 pg (ref 26.0–34.0)
MCHC: 33.9 g/dL (ref 30.0–36.0)
MCV: 87.9 fL (ref 78.0–100.0)
PLATELETS: 238 10*3/uL (ref 150–400)
RBC: 4.37 MIL/uL (ref 3.87–5.11)
RDW: 11.9 % (ref 11.5–15.5)
WBC: 5.4 10*3/uL (ref 4.0–10.5)

## 2016-03-20 MED ORDER — HYDROCODONE-ACETAMINOPHEN 5-325 MG PO TABS
1.0000 | ORAL_TABLET | Freq: Four times a day (QID) | ORAL | Status: DC
  Administered 2016-03-20 – 2016-03-23 (×10): 1 via ORAL
  Filled 2016-03-20 (×10): qty 1

## 2016-03-20 MED ORDER — ZOLPIDEM TARTRATE 5 MG PO TABS
5.0000 mg | ORAL_TABLET | Freq: Every evening | ORAL | Status: DC | PRN
  Administered 2016-03-20 – 2016-03-22 (×3): 5 mg via ORAL
  Filled 2016-03-20 (×3): qty 1

## 2016-03-20 MED ORDER — GABAPENTIN 400 MG PO CAPS
800.0000 mg | ORAL_CAPSULE | Freq: Three times a day (TID) | ORAL | Status: DC
  Administered 2016-03-20 – 2016-03-23 (×9): 800 mg via ORAL
  Filled 2016-03-20 (×9): qty 2

## 2016-03-20 MED ORDER — METHYLPREDNISOLONE SODIUM SUCC 40 MG IJ SOLR
40.0000 mg | Freq: Four times a day (QID) | INTRAMUSCULAR | Status: DC
  Administered 2016-03-20 – 2016-03-22 (×7): 40 mg via INTRAVENOUS
  Filled 2016-03-20 (×7): qty 1

## 2016-03-20 NOTE — Telephone Encounter (Signed)
PLEASE NOTE: All timestamps contained within this report are represented as [place] Standard Time. CONFIDENTIALTY NOTICE: This fax transmission is intended only for the addressee. It contains information that is legally privileged, confidential or otherwise protected from use or disclosure. If you are not the intended recipient, you are strictly prohibited from reviewing, disclosing, copying using or disseminating any of this information or taking any action in reliance on or regarding this information. If you have received this fax in error, please notify us immediately by telephone so that we can arrange for its return to [place]. Phone: [phone], Toll-Free: [phone], Fax: [phone] Page: 1 of 1 Call Id: [id number] [place] Patient Name: Megan Peterson Gender: Female DOB: Dec 19, 1940 Age: 76 Y 9 M 3 D Return Phone Number: [phone] (Primary) City/State/Zip: [place] Client [place] Night - Client Client Site [facility] Physician Renford Dills - MD Who Is Calling Patient / Member / Family / Caregiver Call Type Triage / Clinical Relationship To Patient Self Return Phone Number [phone] (Primary) Chief Complaint Cough Reason for Call Symptomatic / Request for [place] says she has been coughing for a week. Can antibiotic be called in? Nurse Assessment Nurse: Richardson Landry, RN, Aldona Bar Date/Time (Eastern Time): 03/18/2016 11:55:33 AM Confirm and document reason for call. If symptomatic, describe symptoms. ---Caller states she has been feeling weak all over, loss of appetite, has cough, sx began last Monday morning. Denies fever, caller has grayish/green phlegm, is able to cough it up sometimes. Denies other sx. Has been able to drink water and OJ. Does the PT have any chronic conditions? (i.e.  diabetes, asthma, etc.) ---Yes List chronic conditions. ---copd, spinal stenosis Guidelines Guideline Title Affirmed Question Cough - Acute Productive Patient sounds very sick or weak to the triager Disp. Time Eilene Ghazi Time) Disposition Final User 03/18/2016 12:02:00 PM Go to ED Now (or PCP triage) Yes Richardson Landry, RN, Aldona Bar Referrals GO TO FACILITY UNDECIDED Care Advice Given Per Guideline GO TO ED NOW (OR PCP TRIAGE): CALL EMS 911 IF: * Severe difficulty breathing occurs * Lips or face turns blue * Passes out or becomes confused. CARE ADVICE given per Cough - Acute Productive (Adult) guideline.

## 2016-03-20 NOTE — Progress Notes (Signed)
Nutrition Brief Note  RD visited patient per RN request due to patient not eating.   Wt Readings from Last 15 Encounters:  03/20/16 118 lb 14.4 oz (53.9 kg)  09/27/15 120 lb 8 oz (54.7 kg)  02/25/15 121 lb 8 oz (55.1 kg)  04/20/14 119 lb (54 kg)  03/13/14 117 lb 4 oz (53.2 kg)  02/02/14 119 lb 8 oz (54.2 kg)  12/12/13 121 lb 8 oz (55.1 kg)  10/15/13 126 lb 8 oz (57.4 kg)  06/10/13 127 lb 8 oz (57.8 kg)  03/11/13 126 lb 4 oz (57.3 kg)  03/19/12 126 lb 6.4 oz (57.3 kg)  09/06/11 122 lb (55.3 kg)  06/05/11 124 lb (56.2 kg)  06/27/10 130 lb 8 oz (59.2 kg)  05/11/09 129 lb (58.5 kg)    Body mass index is 23.61 kg/m. Patient meets criteria for Normal Weight based on current BMI.   Current diet order is Regular, per nursing flow sheets, pt ate 100% of breakfast this morning and 45% of dinner last night. Pt states that she has had no appetite since yesterday, but was eating well before that. She denies any weight loss. She tried Ensure and Boost supplements, but didn't like them. She wants McDonald[employer] french fries. Pt states that she hasn't eaten anything today. RD encouraged pt to snack every 2-3 hours until appetite picks up. Pt declines nutrition interventions at this time except for snacks.  Labs and medications reviewed.   No further nutrition interventions warranted at this time. Will re-assess patient needs in a few days. Encouraged pt to request return visit from nutrition team via RN if any issues arise.    Scarlette Ar RD, LDN, CSP Inpatient Clinical Dietitian Pager: [phone] After Hours Pager: [phone]

## 2016-03-20 NOTE — Telephone Encounter (Signed)
Per chart review tab pt went to [facility] ED on 03/19/16 and was admitted to hospital.

## 2016-03-20 NOTE — Progress Notes (Signed)
PROGRESS NOTE    Megan Peterson  BHA:[id number] DOB: August 25, 1940 DOA: 03/19/2016 PCP: Elsie Stain, MD   Outpatient Specialists:     Brief Narrative:  Megan Peterson is a 76 y.o. female with a Past Medical History significant for chronic back pain, anxiety, hyperlipidemia, former smoker who presents with cough and flu   Assessment & Plan:   Principal Problem:   Acute respiratory failure ([place]) Active Problems:   HLD (hyperlipidemia)   COPD (chronic obstructive pulmonary disease) with emphysema (HCC)   Generalized anxiety disorder   Influenza    Acute respiratory failure likely related to COPD exacerbation. Oxygen saturation level 88% on room air at rest.  -Nebulizers every 4 hours -Solu-Medrol-wean -levaquin -Continue oxygen supplementation -Sputum culture -Wean  oxygen as able  COPD/bronchiectasis.  -PFTs in 2013: PFTs that did reveal decreased diffusion capacity c/w  Emphysema.  -nebulizers -sputum culture -solumedrol -anti-tussive -wean O2 as tolerated but suspect may need home oxygen  Influenza. + for influenza B - supportive therapy -tamiflu -monitor  otherwise anxiety disorder. Appears stable at baseline. 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 -continue home meds   Patient still not back to baseline-- requiring 3L O2 currently   DVT prophylaxis:  Lovenox   Code Status: Full Code   Family Communication:   Disposition Plan:     Consultants:      Subjective: Says her medications are not being timed right  Objective: Vitals:   03/20/16 0540 03/20/16 0944 03/20/16 1000 03/20/16 1013  BP:   140/66   Pulse:   98   Resp:   18   Temp:   97.9 F (36.6 C)   TempSrc:   Oral   SpO2:  95% 93% (!) 87%  Weight: 53.9 kg (118 lb 14.4 oz)     Height:        Intake/Output Summary (Last 24 hours) at 03/20/16 1126 Last data filed at 03/20/16 0927  Gross per 24 hour  Intake           1857.5 ml  Output              650 ml  Net            1207.5 ml   Filed Weights   03/19/16 0634 03/19/16 1238 03/20/16 0540  Weight: 54.4 kg (120 lb) 53.9 kg (118 lb 13.3 oz) 53.9 kg (118 lb 14.4 oz)    Examination:  General exam: increased work of breathing, looks uncomfortable Respiratory system: diminished with expiratory wheezing Cardiovascular system: S1 & S2 heard, RRR. No JVD, murmurs, rubs, gallops or clicks. No pedal edema. Gastrointestinal system: Abdomen is nondistended, soft and nontender. No organomegaly or masses felt. Normal bowel sounds heard. Central nervous system: Alert and oriented. No focal neurological deficits. Extremities: Symmetric 5 x 5 power.     Data Reviewed: I have personally reviewed following labs and imaging studies  CBC:  Recent Labs Lab 03/19/16 0643 03/20/16 0453  WBC 5.4 5.4  NEUTROABS 2.2  --   HGB 13.5 13.0  HCT 40.8 38.4  MCV 90.5 87.9  PLT 220 973   Basic Metabolic Panel:  Recent Labs Lab 03/19/16 0643  NA 137  K 4.1  CL 97*  CO2 27  GLUCOSE 95  BUN 11  CREATININE 0.70  CALCIUM 9.2   GFR: Estimated Creatinine Clearance: 46.9 mL/min (by C-G formula based on SCr of 0.7 mg/dL). Liver Function Tests: No results for input(s): AST, ALT, ALKPHOS, BILITOT, PROT, ALBUMIN in the  last 168 hours. No results for input(s): LIPASE, AMYLASE in the last 168 hours. No results for input(s): AMMONIA in the last 168 hours. Coagulation Profile: No results for input(s): INR, PROTIME in the last 168 hours. Cardiac Enzymes: No results for input(s): CKTOTAL, CKMB, CKMBINDEX, TROPONINI in the last 168 hours. BNP (last 3 results) No results for input(s): PROBNP in the last 8760 hours. HbA1C: No results for input(s): HGBA1C in the last 72 hours. CBG: No results for input(s): GLUCAP in the last 168 hours. Lipid Profile: No results for input(s): CHOL, HDL, LDLCALC, TRIG, CHOLHDL, LDLDIRECT in the last 72 hours. Thyroid Function Tests: No results for input(s): TSH, T4TOTAL, FREET4,  T3FREE, THYROIDAB in the last 72 hours. Anemia Panel: No results for input(s): VITAMINB12, FOLATE, FERRITIN, TIBC, IRON, RETICCTPCT in the last 72 hours. Urine analysis:    Component Value Date/Time   COLORURINE YELLOW 01/03/2006 1535   LABSPEC 1.025 01/03/2006 1535   PHURINE 6.0 01/03/2006 1535   GLUCOSEU NEGATIVE 01/03/2006 1535   BILIRUBINUR NEGATIVE 01/03/2006 1535   KETONESUR NEGATIVE 01/03/2006 1535   UROBILINOGEN 0.2 mg/dL 01/03/2006 1535   NITRITE Negative 01/03/2006 1535   LEUKOCYTESUR Moderate (A) 01/03/2006 1535     )No results found for this or any previous visit (from the past 240 hour(s)).    Anti-infectives    Start     Dose/Rate Route Frequency Ordered Stop   03/19/16 1300  oseltamivir (TAMIFLU) capsule 30 mg     30 mg Oral 2 times daily 03/19/16 1117 03/23/16 0959   03/19/16 1215  levofloxacin (LEVAQUIN) tablet 500 mg     500 mg Oral Daily 03/19/16 1209     03/19/16 1030  oseltamivir (TAMIFLU) capsule 75 mg     75 mg Oral  Once 03/19/16 1015 03/19/16 1021       Radiology Studies: Ct Chest W Contrast  Result Date: 03/19/2016 CLINICAL DATA:  Fever, chest congestion and cough over the last 6 days. EXAM: CT CHEST WITH CONTRAST TECHNIQUE: Multidetector CT imaging of the chest was performed during intravenous contrast administration. CONTRAST:  33mL ISOVUE-300 IOPAMIDOL (ISOVUE-300) INJECTION 61% COMPARISON:  Chest radiography same day. Chest radiography 02/15/2009. FINDINGS: Cardiovascular: Aortic atherosclerosis. No aneurysm or dissection. Some coronary artery calcification. Heart size is normal. No pericardial effusion. No pulmonary arterial abnormality seen. Mediastinum/Nodes: No mediastinal or hilar mass or lymphadenopathy. Normal appearing mediastinal nodes. Lungs/Pleura: No pleural effusion or focal pleural process. Mild bronchiectasis, lower lobe predominant, with fairly uniform bronchial thickening suggesting bronchitis. Mild linear scarring in the right lower  lobe and right middle lobe. No evidence of pneumonia/consolidation. No focal mass or nodule. Upper Abdomen: Negative Musculoskeletal: Ordinary mild spinal degenerative changes. IMPRESSION: No pneumonia or lobar/segmental collapse. Mild bronchiectasis pattern in the lower lungs. Bronchial wall thickening consistent with bronchitis. Aortic atherosclerosis.  Coronary artery atherosclerosis. Electronically Signed   By: Nelson Chimes M.D.   On: 03/19/2016 09:51   Dg Chest Port 1 View  Result Date: 03/19/2016 CLINICAL DATA:  Fever, shortness of breath and nausea. EXAM: PORTABLE CHEST 1 VIEW COMPARISON:  02/15/2009. FINDINGS: Normal sized heart. Clear left lung. Patchy opacity at the medial right lung base containing small rounded lucencies. Similar appearance previously. Central peribronchial thickening with progression. Mild thoracic spine degenerative changes. IMPRESSION: 1. Probable chronic changes of bronchiectasis at the medial right lung base. Recurrent pneumonia is less likely but not excluded. 2. Mildly progressive bronchitic changes. Electronically Signed   By: Claudie Revering M.D.   On: 03/19/2016 07:43  Scheduled Meds: . enoxaparin (LOVENOX) injection  40 mg Subcutaneous Q24H  . gabapentin  800 mg Oral TID  . guaiFENesin  600 mg Oral BID  . HYDROcodone-acetaminophen  1 tablet Oral Q6H  . ipratropium-albuterol  3 mL Nebulization TID  . levofloxacin  500 mg Oral Daily  . methylPREDNISolone (SOLU-MEDROL) injection  60 mg Intravenous Q6H  . oseltamivir  30 mg Oral BID  . sodium chloride flush  3 mL Intravenous Q12H   Continuous Infusions:   LOS: 0 days    Time spent: 35 min    [place], DO [facility] Pager [phone]  If 7PM-7AM, please contact night-coverage [email or web address] Password [facility] 03/20/2016, 11:26 AM

## 2016-03-21 DIAGNOSIS — J101 Influenza due to other identified influenza virus with other respiratory manifestations: Secondary | ICD-10-CM

## 2016-03-21 MED ORDER — IPRATROPIUM-ALBUTEROL 0.5-2.5 (3) MG/3ML IN SOLN
3.0000 mL | Freq: Two times a day (BID) | RESPIRATORY_TRACT | Status: DC
  Administered 2016-03-21 – 2016-03-23 (×4): 3 mL via RESPIRATORY_TRACT
  Filled 2016-03-21 (×4): qty 3

## 2016-03-21 NOTE — Progress Notes (Signed)
PROGRESS NOTE    Megan Peterson  OQH:[id number] DOB: October 02, 1940 DOA: 03/19/2016 PCP: Elsie Stain, MD   Outpatient Specialists:     Brief Narrative:  Megan Peterson is a 76 y.o. female with a Past Medical History significant for chronic back pain, anxiety, hyperlipidemia, former smoker who presents with cough and flu.  Slow to improve.     Assessment & Plan:   Principal Problem:   Acute respiratory failure (HCC) Active Problems:   HLD (hyperlipidemia)   COPD (chronic obstructive pulmonary disease) with emphysema (HCC)   Generalized anxiety disorder   Influenza    Acute respiratory failure likely related to COPD exacerbation.  -requiring 2.5-3L-- weaning as tolerated -Nebulizers every 4 hours -Solu-Medrol-wean as tolerated -levaquin -Sputum culture  COPD/bronchiectasis.  -PFTs in 2013: PFTs that did reveal decreased diffusion capacity c/w  Emphysema.  -nebulizers -sputum culture -solumedrol -anti-tussive -wean O2 as tolerated but suspect may need home oxygen  Influenza. + for influenza B - supportive therapy -tamiflu -monitor  otherwise anxiety disorder. Appears stable at baseline. 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   DVT prophylaxis:  Lovenox   Code Status: Full Code   Family Communication:   Disposition Plan:     Consultants:      Subjective: Says her medications are not being timed right  Objective: Vitals:   03/20/16 1648 03/20/16 2140 03/20/16 2145 03/21/16 0619  BP:  136/71  (!) 145/74  Pulse:  (!) 110 (!) 102 (!) 123  Resp:  18 18 20   Temp:  98 F (36.7 C)  98.2 F (36.8 C)  TempSrc:  Oral  Oral  SpO2: 93% 94% 95% 95%  Weight:    52.8 kg (116 lb 4.8 oz)  Height:        Intake/Output Summary (Last 24 hours) at 03/21/16 0826 Last data filed at 03/21/16 5465  Gross per 24 hour  Intake              600 ml    Output                0 ml  Net              600 ml   Filed Weights   03/19/16 1238 03/20/16 0540 03/21/16 0619  Weight: 53.9 kg (118 lb 13.3 oz) 53.9 kg (118 lb 14.4 oz) 52.8 kg (116 lb 4.8 oz)    Examination:  General exam: increased work of breathing- slightly improved Respiratory system: diminished - no expiratory wheezing heard Cardiovascular system: S1 & S2 heard, RRR. No JVD, murmurs, rubs, gallops or clicks. No pedal edema. Gastrointestinal system: Abdomen is nondistended, soft and nontender. No organomegaly or masses felt. Normal bowel sounds heard. Central nervous system: Alert and oriented. No focal neurological deficits. Extremities: Symmetric 5 x 5 power.     Data Reviewed: I have personally reviewed following labs and imaging studies  CBC:  Recent Labs Lab 03/19/16 0643 03/20/16 0453  WBC 5.4 5.4  NEUTROABS 2.2  --   HGB 13.5 13.0  HCT 40.8 38.4  MCV 90.5 87.9  PLT 220 681   Basic Metabolic Panel:  Recent Labs Lab 03/19/16 0643  NA 137  K 4.1  CL 97*  CO2 27  GLUCOSE 95  BUN 11  CREATININE 0.70  CALCIUM 9.2   GFR: Estimated Creatinine Clearance: 43.2 mL/min (by C-G formula based on SCr  of 0.7 mg/dL). Liver Function Tests: No results for input(s): AST, ALT, ALKPHOS, BILITOT, PROT, ALBUMIN in the last 168 hours. No results for input(s): LIPASE, AMYLASE in the last 168 hours. No results for input(s): AMMONIA in the last 168 hours. Coagulation Profile: No results for input(s): INR, PROTIME in the last 168 hours. Cardiac Enzymes: No results for input(s): CKTOTAL, CKMB, CKMBINDEX, TROPONINI in the last 168 hours. BNP (last 3 results) No results for input(s): PROBNP in the last 8760 hours. HbA1C: No results for input(s): HGBA1C in the last 72 hours. CBG: No results for input(s): GLUCAP in the last 168 hours. Lipid Profile: No results for input(s): CHOL, HDL, LDLCALC, TRIG, CHOLHDL, LDLDIRECT in the last 72 hours. Thyroid Function Tests: No  results for input(s): TSH, T4TOTAL, FREET4, T3FREE, THYROIDAB in the last 72 hours. Anemia Panel: No results for input(s): VITAMINB12, FOLATE, FERRITIN, TIBC, IRON, RETICCTPCT in the last 72 hours. Urine analysis:    Component Value Date/Time   COLORURINE YELLOW 01/03/2006 1535   LABSPEC 1.025 01/03/2006 1535   PHURINE 6.0 01/03/2006 1535   GLUCOSEU NEGATIVE 01/03/2006 1535   BILIRUBINUR NEGATIVE 01/03/2006 1535   KETONESUR NEGATIVE 01/03/2006 1535   UROBILINOGEN 0.2 mg/dL 01/03/2006 1535   NITRITE Negative 01/03/2006 1535   LEUKOCYTESUR Moderate (A) 01/03/2006 1535     ) Recent Results (from the past 240 hour(s))  Culture, blood (Routine X 2) w Reflex to ID Panel     Status: None (Preliminary result)   Collection Time: 03/19/16  8:53 AM  Result Value Ref Range Status   Specimen Description BLOOD LEFT ANTECUBITAL  Final   Special Requests BOTTLES DRAWN AEROBIC ONLY  10CC  Final   Culture NO GROWTH 1 DAY  Final   Report Status PENDING  Incomplete  Culture, blood (Routine X 2) w Reflex to ID Panel     Status: None (Preliminary result)   Collection Time: 03/19/16  9:00 AM  Result Value Ref Range Status   Specimen Description BLOOD RIGHT ANTECUBITAL  Final   Special Requests BOTTLES DRAWN AEROBIC ONLY  8CC  Final   Culture NO GROWTH 1 DAY  Final   Report Status PENDING  Incomplete      Anti-infectives    Start     Dose/Rate Route Frequency Ordered Stop   03/19/16 1300  oseltamivir (TAMIFLU) capsule 30 mg     30 mg Oral 2 times daily 03/19/16 1117 03/23/16 0959   03/19/16 1215  levofloxacin (LEVAQUIN) tablet 500 mg     500 mg Oral Daily 03/19/16 1209     03/19/16 1030  oseltamivir (TAMIFLU) capsule 75 mg     75 mg Oral  Once 03/19/16 1015 03/19/16 1021       Radiology Studies: Ct Chest W Contrast  Result Date: 03/19/2016 CLINICAL DATA:  Fever, chest congestion and cough over the last 6 days. EXAM: CT CHEST WITH CONTRAST TECHNIQUE: Multidetector CT imaging of the chest  was performed during intravenous contrast administration. CONTRAST:  52mL ISOVUE-300 IOPAMIDOL (ISOVUE-300) INJECTION 61% COMPARISON:  Chest radiography same day. Chest radiography 02/15/2009. FINDINGS: Cardiovascular: Aortic atherosclerosis. No aneurysm or dissection. Some coronary artery calcification. Heart size is normal. No pericardial effusion. No pulmonary arterial abnormality seen. Mediastinum/Nodes: No mediastinal or hilar mass or lymphadenopathy. Normal appearing mediastinal nodes. Lungs/Pleura: No pleural effusion or focal pleural process. Mild bronchiectasis, lower lobe predominant, with fairly uniform bronchial thickening suggesting bronchitis. Mild linear scarring in the right lower lobe and right middle lobe. No evidence of pneumonia/consolidation. No  focal mass or nodule. Upper Abdomen: Negative Musculoskeletal: Ordinary mild spinal degenerative changes. IMPRESSION: No pneumonia or lobar/segmental collapse. Mild bronchiectasis pattern in the lower lungs. Bronchial wall thickening consistent with bronchitis. Aortic atherosclerosis.  Coronary artery atherosclerosis. Electronically Signed   By: Nelson Chimes M.D.   On: 03/19/2016 09:51        Scheduled Meds: . enoxaparin (LOVENOX) injection  40 mg Subcutaneous Q24H  . gabapentin  800 mg Oral TID  . guaiFENesin  600 mg Oral BID  . HYDROcodone-acetaminophen  1 tablet Oral Q6H  . ipratropium-albuterol  3 mL Nebulization TID  . levofloxacin  500 mg Oral Daily  . methylPREDNISolone (SOLU-MEDROL) injection  40 mg Intravenous Q6H  . oseltamivir  30 mg Oral BID  . sodium chloride flush  3 mL Intravenous Q12H   Continuous Infusions:   LOS: 1 day    Time spent: 35 min    [place], DO [facility] Pager [phone]  If 7PM-7AM, please contact night-coverage [email or web address] Password [facility] 03/21/2016, 8:26 AM

## 2016-03-21 NOTE — Progress Notes (Signed)
SATURATION QUALIFICATIONS: (This note is used to comply with regulatory documentation for home oxygen)  Patient Saturations on Room Air at Rest = 88%  Patient Saturations on Room Air while Ambulating = 87%  Patient Saturations on 2 Liters of oxygen while Ambulating = 92%  Please briefly explain why patient needs home oxygen: 

## 2016-03-21 NOTE — Consult Note (Signed)
   [facility] Inpatient Consult   03/21/2016  [place] GIAMMONA 1940-05-13 [id number]   Chart review reveals the patient is Megan Peterson a 76 y.o.femalewith a Past Medical History significant for chronic back pain, anxiety, hyperlipidemia, former smoker who presents with cough and flu.  Slow to improve.   Patient will be followed by [facility] COPD phone call as requested by inpatient RNCM. Patient is a member in the [employer].  No other needs identified at this time.  For questions, please contact:  Natividad Brood, RN BSN [facility] Liaison  [phone] business mobile phone Toll free office [phone]

## 2016-03-22 ENCOUNTER — Encounter (HOSPITAL_COMMUNITY): Payer: Self-pay | Admitting: Student

## 2016-03-22 MED ORDER — PREDNISONE 50 MG PO TABS: 60.0000 mg | ORAL_TABLET | Freq: Every day | ORAL | Status: DC

## 2016-03-22 MED ORDER — PREDNISONE 50 MG PO TABS
60.0000 mg | ORAL_TABLET | Freq: Every day | ORAL | Status: DC
  Administered 2016-03-22 – 2016-03-23 (×2): 60 mg via ORAL
  Filled 2016-03-22 (×2): qty 1

## 2016-03-22 NOTE — Plan of Care (Signed)
Problem: Pain Managment: Goal: General experience of comfort will improve Outcome: Not Met (add Reason) Pt. c/o chronic leg pain

## 2016-03-22 NOTE — Progress Notes (Signed)
Pt has visitors in her room and declines to walk at this time.

## 2016-03-22 NOTE — Progress Notes (Signed)
   03/22/16 1628  Mobility  Activity Ambulate in hall   Pt pre ambulation O2 Sat% was 94 on 2L of O2  Pt post ambulation O2 Sat% was 94 on 2L of O2

## 2016-03-22 NOTE — Progress Notes (Signed)
PROGRESS NOTE    Megan Peterson  CZY:[id number] DOB: 1940/10/18 DOA: 03/19/2016 PCP: Elsie Stain, MD   Outpatient Specialists:     Brief Narrative:  Megan Peterson is a 76 y.o. female with a Past Medical History significant for chronic back pain, anxiety, hyperlipidemia, former smoker who presents with cough and flu.  Slow to improve.     Assessment & Plan:   Principal Problem:   Acute respiratory failure (HCC) Active Problems:   HLD (hyperlipidemia)   COPD (chronic obstructive pulmonary disease) with emphysema (HCC)   Generalized anxiety disorder   Influenza    Acute respiratory failure likely related to COPD exacerbation.  -requiring 2.5-3L-- weaning as tolerated -Nebulizers every 4 hours -Solu-Medrol-wean as tolerated -levaquin -Sputum culture- patient not able to produce adequate specimen  COPD/bronchiectasis.  -PFTs in 2013: PFTs that did reveal decreased diffusion capacity c/w  Emphysema.  -nebulizers -solumedrol- weaned to PO -anti-tussive -wean O2 as tolerated but suspect may need home oxygen  Influenza. + for influenza B - supportive therapy -tamiflu x 5 days -monitor  otherwise anxiety disorder. Appears stable at baseline. 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  Will ambulate patient later today and consider discharge   DVT prophylaxis:  Lovenox   Code Status: Full Code   Family Communication:   Disposition Plan:     Consultants:      Subjective: Tired of being awoken at night Thinks her breathing has improved  Objective: Vitals:   03/21/16 2119 03/21/16 2206 03/22/16 0553 03/22/16 0926  BP:  (!) 120/53 132/81   Pulse:  (!) 109 (!) 116   Resp:  18 18   Temp:  98 F (36.7 C) 98 F (36.7 C)   TempSrc:  Oral Oral   SpO2: 95% 95%  95% 92%  Weight:   53.5 kg (117 lb 14.4 oz)   Height:        Intake/Output Summary (Last 24 hours) at 03/22/16 1151 Last data filed at 03/22/16 0555  Gross per 24 hour  Intake              240 ml  Output              450 ml  Net             -210 ml   Filed Weights   03/20/16 0540 03/21/16 0619 03/22/16 0553  Weight: 53.9 kg (118 lb 14.4 oz) 52.8 kg (116 lb 4.8 oz) 53.5 kg (117 lb 14.4 oz)    Examination:  General exam: appears more relaxed Respiratory system: no wheeing, moving more air Cardiovascular system: S1 & S2 heard, RRR. No JVD, murmurs, rubs, gallops or clicks. No pedal edema. Gastrointestinal system: Abdomen is nondistended, soft and nontender. No organomegaly or masses felt. Normal bowel sounds heard. Central nervous system: Alert and oriented. No focal neurological deficits. Extremities: Symmetric 5 x 5 power.     Data Reviewed: I have personally reviewed following labs and imaging studies  CBC:  Recent Labs Lab 03/19/16 0643 03/20/16 0453  WBC 5.4 5.4  NEUTROABS 2.2  --   HGB 13.5 13.0  HCT 40.8 38.4  MCV 90.5 87.9  PLT 220 093   Basic Metabolic Panel:  Recent Labs Lab 03/19/16 0643  NA 137  K  4.1  CL 97*  CO2 27  GLUCOSE 95  BUN 11  CREATININE 0.70  CALCIUM 9.2   GFR: Estimated Creatinine Clearance: 46.8 mL/min (by C-G formula based on SCr of 0.7 mg/dL). Liver Function Tests: No results for input(s): AST, ALT, ALKPHOS, BILITOT, PROT, ALBUMIN in the last 168 hours. No results for input(s): LIPASE, AMYLASE in the last 168 hours. No results for input(s): AMMONIA in the last 168 hours. Coagulation Profile: No results for input(s): INR, PROTIME in the last 168 hours. Cardiac Enzymes: No results for input(s): CKTOTAL, CKMB, CKMBINDEX, TROPONINI in the last 168 hours. BNP (last 3 results) No results for input(s): PROBNP in the last 8760 hours. HbA1C: No results for input(s): HGBA1C in the last 72 hours. CBG: No results for input(s):  GLUCAP in the last 168 hours. Lipid Profile: No results for input(s): CHOL, HDL, LDLCALC, TRIG, CHOLHDL, LDLDIRECT in the last 72 hours. Thyroid Function Tests: No results for input(s): TSH, T4TOTAL, FREET4, T3FREE, THYROIDAB in the last 72 hours. Anemia Panel: No results for input(s): VITAMINB12, FOLATE, FERRITIN, TIBC, IRON, RETICCTPCT in the last 72 hours. Urine analysis:    Component Value Date/Time   COLORURINE YELLOW 01/03/2006 1535   LABSPEC 1.025 01/03/2006 1535   PHURINE 6.0 01/03/2006 [place] 01/03/2006 1535   BILIRUBINUR NEGATIVE 01/03/2006 1535   KETONESUR NEGATIVE 01/03/2006 1535   UROBILINOGEN 0.2 mg/dL 01/03/2006 1535   NITRITE Negative 01/03/2006 1535   LEUKOCYTESUR Moderate (A) 01/03/2006 1535     ) Recent Results (from the past 240 hour(s))  Culture, blood (Routine X 2) w Reflex to ID Panel     Status: None (Preliminary result)   Collection Time: 03/19/16  8:53 AM  Result Value Ref Range Status   Specimen Description BLOOD LEFT ANTECUBITAL  Final   Special Requests BOTTLES DRAWN AEROBIC ONLY  10CC  Final   Culture NO GROWTH 3 DAYS  Final   Report Status PENDING  Incomplete  Culture, blood (Routine X 2) w Reflex to ID Panel     Status: None (Preliminary result)   Collection Time: 03/19/16  9:00 AM  Result Value Ref Range Status   Specimen Description BLOOD RIGHT ANTECUBITAL  Final   Special Requests BOTTLES DRAWN AEROBIC ONLY  8CC  Final   Culture NO GROWTH 3 DAYS  Final   Report Status PENDING  Incomplete      Anti-infectives    Start     Dose/Rate Route Frequency Ordered Stop   03/19/16 1300  oseltamivir (TAMIFLU) capsule 30 mg     30 mg Oral 2 times daily 03/19/16 1117 03/23/16 0959   03/19/16 1215  levofloxacin (LEVAQUIN) tablet 500 mg     500 mg Oral Daily 03/19/16 1209     03/19/16 1030  oseltamivir (TAMIFLU) capsule 75 mg     75 mg Oral  Once 03/19/16 1015 03/19/16 1021       Radiology Studies: No results  found.      Scheduled Meds: . enoxaparin (LOVENOX) injection  40 mg Subcutaneous Q24H  . gabapentin  800 mg Oral TID  . guaiFENesin  600 mg Oral BID  . HYDROcodone-acetaminophen  1 tablet Oral Q6H  . ipratropium-albuterol  3 mL Nebulization BID  . levofloxacin  500 mg Oral Daily  . oseltamivir  30 mg Oral BID  . predniSONE  60 mg Oral Q breakfast  . sodium chloride flush  3 mL Intravenous Q12H   Continuous Infusions:   LOS: 2 days  Time spent: 35 min    [place], DO [facility] Pager [phone]  If 7PM-7AM, please contact night-coverage [email or web address] Password [facility] 03/22/2016, 11:51 AM

## 2016-03-23 MED ORDER — ALBUTEROL SULFATE (2.5 MG/3ML) 0.083% IN NEBU: 2.5000 mg | INHALATION_SOLUTION | RESPIRATORY_TRACT | 12 refills | Status: DC | PRN

## 2016-03-23 MED ORDER — LEVOFLOXACIN 500 MG PO TABS: 500.0000 mg | ORAL_TABLET | Freq: Every day | ORAL | 0 refills | Status: DC

## 2016-03-23 MED ORDER — IPRATROPIUM-ALBUTEROL 0.5-2.5 (3) MG/3ML IN SOLN: 3.0000 mL | Freq: Two times a day (BID) | RESPIRATORY_TRACT | 0 refills | Status: DC

## 2016-03-23 MED ORDER — GUAIFENESIN ER 600 MG PO TB12: 600.0000 mg | ORAL_TABLET | Freq: Two times a day (BID) | ORAL | 0 refills | Status: DC

## 2016-03-23 MED ORDER — PREDNISONE 10 MG PO TABS: ORAL_TABLET | ORAL | 0 refills | Status: DC

## 2016-03-23 NOTE — Discharge Summary (Signed)
Physician Discharge Summary  Megan Peterson EHU:[id number] DOB: 25-Sep-1940 DOA: 03/19/2016  PCP: Elsie Stain, MD  Admit date: 03/19/2016 Discharge date: 03/23/2016   Recommendations for Outpatient Follow-Up:   1. Wean home O2 as tolerated   Discharge Diagnosis:   Principal Problem:   Acute respiratory failure (HCC) Active Problems:   HLD (hyperlipidemia)   COPD (chronic obstructive pulmonary disease) with emphysema (HCC)   Generalized anxiety disorder   Influenza   Discharge disposition:  Home.  Discharge Condition: Improved.  Diet recommendation: Low sodium, heart healthy  Wound care: None.   History of Present Illness:   Megan Peterson is a 76 y.o. female with a Past Medical History significant for chronic back pain, anxiety, hyperlipidemia, former smoker who presents with cough and flu   Hospital Course by Problem:   Acute respiratory failure likely related to COPD exacerbation.  -requiring 2L-- will send home for outpatient titration -Nebulizers every 4 hours -levaquin  COPD/bronchiectasis.  -PFTs in 2013: PFTs that did reveal decreased diffusion capacity c/w Emphysema.  -nebulizers -solumedrol- weaned to PO -anti-tussive  Influenza. + for influenza B -supportive therapy -tamiflu completed -monitor  otherwise anxiety disorder. Appears stable at baseline. 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   Discharge Exam:   Vitals:   03/22/16 2134 03/23/16 0633  BP: (!) 126/57 131/81  Pulse: (!) 101 (!) 109  Resp: 16 17  Temp: 98.5 F (36.9 C) 97.8 F (36.6 C)   Vitals:   03/22/16 1205 03/22/16 1944 03/22/16 2134 03/23/16 0633  BP: 116/62  (!) 126/57 131/81  Pulse: (!) 106 97 (!) 101 (!) 109  Resp: 20 18 16 17   Temp: 98.1 F (36.7 C)  98.5 F (36.9 C) 97.8 F (36.6 C)  TempSrc: Oral  Oral Oral  SpO2: 95% 94% 94% 95%  Weight:    54 kg (119 lb 1.6 oz)  Height:         Gen:  NAD- much improved   The results of significant diagnostics from this hospitalization (including imaging, microbiology, ancillary and laboratory) are listed below for reference.     Procedures and Diagnostic Studies:   Ct Chest W Contrast  Result Date: 03/19/2016 CLINICAL DATA:  Fever, chest congestion and cough over the last 6 days. EXAM: CT CHEST WITH CONTRAST TECHNIQUE: Multidetector CT imaging of the chest was performed during intravenous contrast administration. CONTRAST:  43mL ISOVUE-300 IOPAMIDOL (ISOVUE-300) INJECTION 61% COMPARISON:  Chest radiography same day. Chest radiography 02/15/2009. FINDINGS: Cardiovascular: Aortic atherosclerosis. No aneurysm or dissection. Some coronary artery calcification. Heart size is normal. No pericardial effusion. No pulmonary arterial abnormality seen. Mediastinum/Nodes: No mediastinal or hilar mass or lymphadenopathy. Normal appearing mediastinal nodes. Lungs/Pleura: No pleural effusion or focal pleural process. Mild bronchiectasis, lower lobe predominant, with fairly uniform bronchial thickening suggesting bronchitis. Mild linear scarring in the right lower lobe and right middle lobe. No evidence of pneumonia/consolidation. No focal mass or nodule. Upper Abdomen: Negative Musculoskeletal: Ordinary mild spinal degenerative changes. IMPRESSION: No pneumonia or lobar/segmental collapse. Mild bronchiectasis pattern in the lower lungs. Bronchial wall thickening consistent with bronchitis. Aortic atherosclerosis.  Coronary artery atherosclerosis. Electronically Signed   By: Nelson Chimes M.D.   On: 03/19/2016 09:51   Dg Chest Port 1 View  Result Date: 03/19/2016 CLINICAL DATA:  Fever, shortness of breath and nausea. EXAM: PORTABLE CHEST 1 VIEW COMPARISON:  02/15/2009. FINDINGS: Normal sized heart. Clear left lung. Patchy opacity at  the medial right lung base containing small rounded lucencies. Similar appearance previously. Central peribronchial  thickening with progression. Mild thoracic spine degenerative changes. IMPRESSION: 1. Probable chronic changes of bronchiectasis at the medial right lung base. Recurrent pneumonia is less likely but not excluded. 2. Mildly progressive bronchitic changes. Electronically Signed   By: Claudie Revering M.D.   On: 03/19/2016 07:43     Labs:   Basic Metabolic Panel:  Recent Labs Lab 03/19/16 0643  NA 137  K 4.1  CL 97*  CO2 27  GLUCOSE 95  BUN 11  CREATININE 0.70  CALCIUM 9.2   GFR Estimated Creatinine Clearance: 46.9 mL/min (by C-G formula based on SCr of 0.7 mg/dL). Liver Function Tests: No results for input(s): AST, ALT, ALKPHOS, BILITOT, PROT, ALBUMIN in the last 168 hours. No results for input(s): LIPASE, AMYLASE in the last 168 hours. No results for input(s): AMMONIA in the last 168 hours. Coagulation profile No results for input(s): INR, PROTIME in the last 168 hours.  CBC:  Recent Labs Lab 03/19/16 0643 03/20/16 0453  WBC 5.4 5.4  NEUTROABS 2.2  --   HGB 13.5 13.0  HCT 40.8 38.4  MCV 90.5 87.9  PLT 220 238   Cardiac Enzymes: No results for input(s): CKTOTAL, CKMB, CKMBINDEX, TROPONINI in the last 168 hours. BNP: Invalid input(s): POCBNP CBG: No results for input(s): GLUCAP in the last 168 hours. D-Dimer No results for input(s): DDIMER in the last 72 hours. Hgb A1c No results for input(s): HGBA1C in the last 72 hours. Lipid Profile No results for input(s): CHOL, HDL, LDLCALC, TRIG, CHOLHDL, LDLDIRECT in the last 72 hours. Thyroid function studies No results for input(s): TSH, T4TOTAL, T3FREE, THYROIDAB in the last 72 hours.  Invalid input(s): FREET3 Anemia work up No results for input(s): VITAMINB12, FOLATE, FERRITIN, TIBC, IRON, RETICCTPCT in the last 72 hours. Microbiology Recent Results (from the past 240 hour(s))  Culture, blood (Routine X 2) w Reflex to ID Panel     Status: None (Preliminary result)   Collection Time: 03/19/16  8:53 AM  Result Value  Ref Range Status   Specimen Description BLOOD LEFT ANTECUBITAL  Final   Special Requests BOTTLES DRAWN AEROBIC ONLY  10CC  Final   Culture NO GROWTH 3 DAYS  Final   Report Status PENDING  Incomplete  Culture, blood (Routine X 2) w Reflex to ID Panel     Status: None (Preliminary result)   Collection Time: 03/19/16  9:00 AM  Result Value Ref Range Status   Specimen Description BLOOD RIGHT ANTECUBITAL  Final   Special Requests BOTTLES DRAWN AEROBIC ONLY  8CC  Final   Culture NO GROWTH 3 DAYS  Final   Report Status PENDING  Incomplete     Discharge Instructions:   Discharge Instructions    Diet - low sodium heart healthy    Complete by:  As directed    Discharge instructions    Complete by:  As directed    Wean O2 as tolerated as an outpatient tamiflu course finished in the hospital   Increase activity slowly    Complete by:  As directed      Allergies as of 03/23/2016      Reactions   Buspar [buspirone] Other (See Comments)   Dizzy and nauseated.   Cymbalta [duloxetine Hcl] Other (See Comments)   fatigue   Lexapro [escitalopram Oxalate] Other (See Comments)   Per patient, intolerant   Meperidine Hcl Other (See Comments)   unknown   Morphine  And Related Other (See Comments)   Vomiting    Statins Other (See Comments)   myalgias      Medication List    STOP taking these medications   doxycycline 100 MG capsule Commonly known as:  VIBRAMYCIN     TAKE these medications   ADVIL PM 200-25 MG Caps Generic drug:  Ibuprofen-Diphenhydramine HCl Take 1 capsule by mouth at bedtime as needed (sleep).   albuterol (2.5 MG/3ML) 0.083% nebulizer solution Commonly known as:  PROVENTIL Take 3 mLs (2.5 mg total) by nebulization every 2 (two) hours as needed for shortness of breath.   diphenhydrAMINE 25 MG tablet Commonly known as:  BENADRYL Take 25 mg by mouth every 6 (six) hours as needed for allergies.   gabapentin 800 MG tablet Commonly known as:  NEURONTIN TAKE 1 TABLET  BY MOUTH 3 TIMES DAILY   guaiFENesin 600 MG 12 hr tablet Commonly known as:  MUCINEX Take 1 tablet (600 mg total) by mouth 2 (two) times daily.   HYDROcodone-acetaminophen 5-325 MG tablet Commonly known as:  NORCO/VICODIN Take 1 tablet every 6 hours as needed for pain.   ibuprofen 600 MG tablet Commonly known as:  ADVIL,MOTRIN TAKE 1 TABLET BY MOUTH DAILY AS NEEDED What changed:  See the new instructions.   ipratropium-albuterol 0.5-2.5 (3) MG/3ML Soln Commonly known as:  DUONEB Take 3 mLs by nebulization 2 (two) times daily.   levofloxacin 500 MG tablet Commonly known as:  LEVAQUIN Take 1 tablet (500 mg total) by mouth daily.   predniSONE 10 MG tablet Commonly known as:  DELTASONE 50 mg po x 2 days then 40 mg PO x 2 days then 30 mg PO x 2 days then 20 mg PO x 2 days then 10 mg PO x 2 days then stop            Durable Medical Equipment        Start     Ordered   03/22/16 0847  For home use only DME Nebulizer machine  Once    Question:  Patient needs a nebulizer to treat with the following condition  Answer:  COPD (chronic obstructive pulmonary disease) ([place])   03/22/16 1165   03/22/16 0847  For home use only DME oxygen  Once    Question Answer Comment  Mode or (Route) Nasal cannula   Liters per Minute 2   Frequency Continuous (stationary and portable oxygen unit needed)   Oxygen delivery system Gas      03/22/16 0846     Follow-up Information    Elsie Stain, MD Follow up in 1 week(s).   Specialty:  Family Medicine Contact information: [place]  [address] [phone]            Time coordinating discharge: 35 min  Signed:  Reice Bienvenue Alison Stalling   [facility] 03/23/2016, 8:31 AM

## 2016-03-23 NOTE — Progress Notes (Signed)
SATURATION QUALIFICATIONS: (This note is used to comply with regulatory documentation for home oxygen)  Patient Saturations on Room Air at Rest = 95%  Patient Saturations on Room Air while Ambulating = 87%  Patient Saturations on 2Liters of oxygen while Ambulating = 96%  Please briefly explain why patient needs home oxygen: pt 02 sat dropped with elevated HR 140s

## 2016-03-23 NOTE — Progress Notes (Signed)
[facility] called for home oxygen and nebulizer machine to be delivered to the room today prior to discharging home; [name] [phone]

## 2016-03-23 NOTE — Progress Notes (Signed)
Pt is sitting of side of bed with granddaughter at bedside, Medicated for pain and inflammation of the lungs. Plan to have oxygen deliver and Discharged home.

## 2016-03-24 ENCOUNTER — Telehealth: Payer: Self-pay

## 2016-03-24 ENCOUNTER — Ambulatory Visit: Payer: PPO | Admitting: Family Medicine

## 2016-03-24 LAB — CULTURE, BLOOD (ROUTINE X 2)
Culture: NO GROWTH
Culture: NO GROWTH

## 2016-03-24 MED ORDER — IPRATROPIUM-ALBUTEROL 0.5-2.5 (3) MG/3ML IN SOLN: 3.0000 mL | Freq: Two times a day (BID) | RESPIRATORY_TRACT | 0 refills | Status: DC

## 2016-03-24 MED ORDER — ALBUTEROL SULFATE (2.5 MG/3ML) 0.083% IN NEBU: 2.5000 mg | INHALATION_SOLUTION | RESPIRATORY_TRACT | 12 refills | Status: DC | PRN

## 2016-03-24 NOTE — Telephone Encounter (Signed)
Pt is out of albuterol neb solution and prior auth needs to be completed. Pt is resting now and is OK with her breathing now. Pt request cb when PA completed. [facility] [employer].

## 2016-03-24 NOTE — Telephone Encounter (Signed)
Pts daughter Ferman Hamming called to inquire about the status of the nebulizer.  I explained that the document we received from the pharmacy had the ED physician's name on it and was for diabetic supplies.  That we had to request a new form from [employer] with Dr. Josefine Class name on it.  We have not received the new info yet. I attempted to contact [facility] but couldn't get through to a person.  I left a message inquiring about the status.  Ms. Lucia Gaskins advised that if she doesn't hear back from [place] by 3pm today she will be calling back.

## 2016-03-24 NOTE — Telephone Encounter (Signed)
I received questionaire but has the ED provider's name on it and the questions are related to diabetic supplies---ok per Dr Damita Dunnings to send Rx in his name and wait for PA kick back Rx sent through [facility]-scribe

## 2016-03-25 NOTE — Telephone Encounter (Signed)
Make sure if patient is using the nebulizer at home that the pharmacy is filing the nebulizer solution under Medicare Part B and not Medicare part D.

## 2016-03-27 ENCOUNTER — Encounter: Payer: Self-pay | Admitting: Family Medicine

## 2016-03-27 ENCOUNTER — Ambulatory Visit (INDEPENDENT_AMBULATORY_CARE_PROVIDER_SITE_OTHER): Payer: PPO | Admitting: Family Medicine

## 2016-03-27 DIAGNOSIS — J479 Bronchiectasis, uncomplicated: Secondary | ICD-10-CM

## 2016-03-27 DIAGNOSIS — J111 Influenza due to unidentified influenza virus with other respiratory manifestations: Secondary | ICD-10-CM | POA: Diagnosis not present

## 2016-03-27 MED ORDER — IPRATROPIUM-ALBUTEROL 0.5-2.5 (3) MG/3ML IN SOLN: 3.0000 mL | Freq: Two times a day (BID) | RESPIRATORY_TRACT | Status: DC

## 2016-03-27 NOTE — Telephone Encounter (Signed)
Per pts daughter, medication has been picked up from the pharmacy

## 2016-03-27 NOTE — Patient Instructions (Signed)
Add on combivent twice a day.  Use plain albuterol as needed.  Recheck in about 2 weeks.  Continue oxygen for now.  Take care.  Glad to see you.

## 2016-03-27 NOTE — Telephone Encounter (Signed)
Message sent to [facility] because she was working on this last week.

## 2016-03-27 NOTE — Progress Notes (Signed)
Admit date: 03/19/2016 Discharge date: 03/23/2016   Recommendations for Outpatient Follow-Up:   1. Wean home O2 as tolerated   Discharge Diagnosis:   Principal Problem:   Acute respiratory failure (HCC) Active Problems:   HLD (hyperlipidemia)   COPD (chronic obstructive pulmonary disease) with emphysema (HCC)   Generalized anxiety disorder   Influenza  Discharge disposition:  Home.  Discharge Condition: Improved.  Diet recommendation: Low sodium, heart healthy  Wound care: None.   History of Present Illness:   Megan Peterson a 76 y.o.femalewith a Past Medical History significant for chronic back pain, anxiety, hyperlipidemia, former smoker who presents with cough and flu   Hospital Course by Problem:   Acute respiratory failure likely related to COPD exacerbation.  -requiring 2L-- will send home for outpatient titration -Nebulizers every 4 hours -levaquin  COPD/bronchiectasis.  -PFTs in 2013: PFTs that did reveal decreased diffusion capacity c/w Emphysema.  -nebulizers -solumedrol- weaned to PO -anti-tussive  Influenza. + for influenza B -supportive therapy -tamiflu completed -monitor  otherwise anxiety disorder. Appears stable at baseline. 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   Discharge Exam:       Vitals:   03/22/16 2134 03/23/16 0633  BP: (!) 126/57 131/81  Pulse: (!) 101 (!) 109  Resp: 16 17  Temp: 98.5 F (36.9 C) 97.8 F (36.6 C)         Vitals:   03/22/16 1205 03/22/16 1944 03/22/16 2134 03/23/16 0633  BP: 116/62  (!) 126/57 131/81  Pulse: (!) 106 97 (!) 101 (!) 109  Resp: 20 18 16 17   Temp: 98.1 F (36.7 C)  98.5 F (36.9 C) 97.8 F (36.6 C)  TempSrc: Oral  Oral Oral  SpO2: 95% 94% 94% 95%  Weight:    54 kg (119 lb 1.6 oz)  Height:        Gen:  NAD- much improved   The results of significant  diagnostics from this hospitalization (including imaging, microbiology, ancillary and laboratory) are listed below for reference.     Procedures and Diagnostic Studies:   Ct Chest W Contrast  Result Date: 03/19/2016 CLINICAL DATA:  Fever, chest congestion and cough over the last 6 days. EXAM: CT CHEST WITH CONTRAST TECHNIQUE: Multidetector CT imaging of the chest was performed during intravenous contrast administration. CONTRAST:  4mL ISOVUE-300 IOPAMIDOL (ISOVUE-300) INJECTION 61% COMPARISON:  Chest radiography same day. Chest radiography 02/15/2009. FINDINGS: Cardiovascular: Aortic atherosclerosis. No aneurysm or dissection. Some coronary artery calcification. Heart size is normal. No pericardial effusion. No pulmonary arterial abnormality seen. Mediastinum/Nodes: No mediastinal or hilar mass or lymphadenopathy. Normal appearing mediastinal nodes. Lungs/Pleura: No pleural effusion or focal pleural process. Mild bronchiectasis, lower lobe predominant, with fairly uniform bronchial thickening suggesting bronchitis. Mild linear scarring in the right lower lobe and right middle lobe. No evidence of pneumonia/consolidation. No focal mass or nodule. Upper Abdomen: Negative Musculoskeletal: Ordinary mild spinal degenerative changes. IMPRESSION: No pneumonia or lobar/segmental collapse. Mild bronchiectasis pattern in the lower lungs. Bronchial wall thickening consistent with bronchitis. Aortic atherosclerosis.  Coronary artery atherosclerosis. Electronically Signed   By: Nelson Chimes M.D.   On: 03/19/2016 09:51    ========================================== Inpatient course discussed with patient. Admitted with flu.. Imaging done, bronchiectasis noted. Treated for flu. Started on supplemental oxygen. Discharged home. Here for follow-up. Finishing prednisone tomorrow and never used combivent neb in the meantime.  Still using albuterol neg BID.    Her breathing is  clearly improved. Still on oxygen, 2 L via  nasal cannula. Some cough. No discolored sputum. No fevers. She feels much improved overall compared to admission.  Imaging an inpatient course discussed with patient, specifically bronchiectasis on imaging. She no longer smokes.  PMH and SH reviewed  ROS: Per HPI unless specifically indicated in ROS section   Meds, vitals, and allergies reviewed.   GEN: nad, alert and oriented, on supplemental oxygen, 2 L via nasal cannula HEENT: mucous membranes moist NECK: supple w/o LA CV: rrr. PULM: ctab, no inc wob ABD: soft, +bs EXT: no edema SKIN: no acute rash  Initially her pulse ox is in the upper 90s on 2 L. With walking a short distance in the hallway the clinic her pulse ox dropped to 89% and her pulse went up to 125. This was on room air. When she rested back on supplemental oxygen her pulse ox came back to the upper 90s and her pulse went back to the 80s.

## 2016-03-27 NOTE — Progress Notes (Signed)
Pre visit review using our clinic review tool, if applicable. No additional management support is needed unless otherwise documented below in the visit note. 

## 2016-03-28 ENCOUNTER — Encounter: Payer: Self-pay | Admitting: Family Medicine

## 2016-03-28 ENCOUNTER — Other Ambulatory Visit: Payer: Self-pay

## 2016-03-28 DIAGNOSIS — J479 Bronchiectasis, uncomplicated: Secondary | ICD-10-CM | POA: Insufficient documentation

## 2016-03-28 NOTE — Assessment & Plan Note (Signed)
Not smoking now. Former smoker. Imaging discussed with patient. Still on supplemental oxygen. Not using Combivent. Advised to continue supplemental oxygen for now, start Combivent, use albuterol neb as needed in between doses of Combivent. Recheck in 2 weeks. We will see if she is able to ambulate off oxygen at that point. All questions answered. Okay for outpatient follow-up. >25 minutes spent in face to face time with patient, >50% spent in counselling or coordination of care.

## 2016-03-28 NOTE — Assessment & Plan Note (Signed)
Resolve. See discussion bronchiectasis.

## 2016-03-28 NOTE — Consult Note (Signed)
Call received from patient's daughter, Jackquline Bosch, HIPAA verified, she states that her mom has been recommended for machine to put on her finger to check her oxygen.  She wondered if [employer] would pay for the device.  Encouraged her to speak with the concierge for [employer] plan but that  a pulse oximeter would likely be affordable at [employer], [facility] or [facility].  Daughter states she will follow up at one of the stores and encouraged her to check with her primary care provider for any specific kind he is recommending.  She endorses that she has the magnet and brochure and will call the nurse advise line if she has any other needs.  She states that they are receiving the EMMI calls but have not been able to answer. No other problems or concerns were indicated.  Natividad Brood, RN BSN [facility] Liaison  [phone] business mobile phone Toll free office [phone]

## 2016-03-30 ENCOUNTER — Other Ambulatory Visit: Payer: Self-pay | Admitting: *Deleted

## 2016-03-30 NOTE — Patient Outreach (Signed)
[facility]) Care Management  03/30/2016  Megan Peterson 09-01-1940 [id number]  EMMI-Pneumonia referral for red dashboard of being confused: EMMI-COPD referral for red dashboard on # of times rescue inhaler used-10 times:  Telephone call to patient who was advised of reason for call & of [facility] care management services. HIPPA verification received from patient. Patient voices that she did not have pneumonia & that she was not confused. Also states she did not advise that she had taken rescue inhaler 10 times. States she is only taking medications as prescribed by MD and inhaler is not prescribed that many times. States she wishes to have all of automated calls stopped.   Voices that she has " touch of emphysema" States she stopped smoking 25 years ago. Voices most recent admission was advised that she had bronchiectasis and was recovering from flu. Has completed prednisone & antibiotic dosage.    Patient voices that she was discharged 3/22 from hospital & had follow up appointment.completed with primary care provider-Dr. Damita Dunnings 03/27/2016. States she is using inhalers, nebulizer treatments and oxygen therapy as advised upon discharge from hospital.   States she has family support & daughter is assisting her as needed.   Patient declines [facility] care management services & states she has contact information. Thanked this RN Tourist information centre manager for call.  Plan. Close case. Stop EMMI calls at patient request..   Sherrin Daisy, RN BSN [place] Management Coordinator [facility] Care Management  [phone]

## 2016-04-10 ENCOUNTER — Ambulatory Visit: Payer: PPO | Admitting: Family Medicine

## 2016-04-11 ENCOUNTER — Ambulatory Visit (INDEPENDENT_AMBULATORY_CARE_PROVIDER_SITE_OTHER): Payer: PPO | Admitting: Family Medicine

## 2016-04-11 ENCOUNTER — Encounter: Payer: Self-pay | Admitting: Family Medicine

## 2016-04-11 ENCOUNTER — Ambulatory Visit (INDEPENDENT_AMBULATORY_CARE_PROVIDER_SITE_OTHER): Payer: PPO

## 2016-04-11 VITALS — BP 110/74 | HR 100 | Temp 98.0°F | Ht 59.5 in | Wt 123.5 lb

## 2016-04-11 VITALS — BP 110/74 | HR 100 | Temp 98.0°F | Wt 123.5 lb

## 2016-04-11 DIAGNOSIS — R0902 Hypoxemia: Secondary | ICD-10-CM

## 2016-04-11 DIAGNOSIS — Z0000 Encounter for general adult medical examination without abnormal findings: Secondary | ICD-10-CM | POA: Diagnosis not present

## 2016-04-11 DIAGNOSIS — J9601 Acute respiratory failure with hypoxia: Secondary | ICD-10-CM | POA: Diagnosis not present

## 2016-04-11 NOTE — Progress Notes (Signed)
Pre visit review using our clinic review tool, if applicable. No additional management support is needed unless otherwise documented below in the visit note. 

## 2016-04-11 NOTE — Progress Notes (Signed)
PCP notes:   Health maintenance:  Mammogram - pt will schedule future date  Abnormal screenings:   None  Patient concerns:   Patient is concerned about recent changes to health status and current use of oxygen.   Nurse concerns:  None  Next PCP appt:   10/17/16 @ 0945  I reviewed health advisor's note, was available for consultation on the day of service listed in this note, and agree with documentation and plan. Elsie Stain, MD.

## 2016-04-11 NOTE — Patient Instructions (Addendum)
Check with DME suppliers if you want to change and let me know.   Rosaria Ferries will call about your referral. You can try cutting the nebs back to once a day and see if you are less jittery.  Take care.  Glad to see you.  Update me as needed.  See Lattie Haw on the way out.   Okay to push the next appointment with Damita Dunnings back since you are going to see pulmonary.
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
## 2016-04-11 NOTE — Patient Instructions (Addendum)
Megan Peterson , Thank you for taking time to come for your Medicare Wellness Visit. I appreciate your ongoing commitment to your health goals. Please review the following plan we discussed and let me know if I can assist you in the future.   These are the goals we discussed: Goals    . reduce use of oxygen          When scheduled, I will participate in pulmonary rehab as ordered to reduce use of oxygen.        This is a list of the screening recommended for you and due dates:  Health Maintenance  Topic Date Due  . Mammogram  05/01/2017*  . Colon Cancer Screening  07/08/2016  . Flu Shot  08/02/2016  . DEXA scan (bone density measurement)  01/07/2019  . Tetanus Vaccine  06/06/2021  . Pneumonia vaccines  Completed  *Topic was postponed. The date shown is not the original due date.   Preventive Care for Adults  A healthy lifestyle and preventive care can promote health and wellness. Preventive health guidelines for adults include the following key practices.  . A routine yearly physical is a good way to check with your health care provider about your health and preventive screening. It is a chance to share any concerns and updates on your health and to receive a thorough exam.  . Visit your dentist for a routine exam and preventive care every 6 months. Brush your teeth twice a day and floss once a day. Good oral hygiene prevents tooth decay and gum disease.  . The frequency of eye exams is based on your age, health, family medical history, use  of contact lenses, and other factors. Follow your health care provider's ecommendations for frequency of eye exams.  . Eat a healthy diet. Foods like vegetables, fruits, whole grains, low-fat dairy products, and lean protein foods contain the nutrients you need without too many calories. Decrease your intake of foods high in solid fats, added sugars, and salt. Eat the right amount of calories for you. Get information about a proper diet from your  health care provider, if necessary.  . Regular physical exercise is one of the most important things you can do for your health. Most adults should get at least 150 minutes of moderate-intensity exercise (any activity that increases your heart rate and causes you to sweat) each week. In addition, most adults need muscle-strengthening exercises on 2 or more days a week.  Silver Sneakers may be a benefit available to you. To determine eligibility, you may visit the website: www.silversneakers.com or contact program at 415-385-8703 Mon-Fri between 8AM-8PM.   . Maintain a healthy weight. The body mass index (BMI) is a screening tool to identify possible weight problems. It provides an estimate of body fat based on height and weight. Your health care provider can find your BMI and can help you achieve or maintain a healthy weight.   For adults 20 years and older: ? A BMI below 18.5 is considered underweight. ? A BMI of 18.5 to 24.9 is normal. ? A BMI of 25 to 29.9 is considered overweight. ? A BMI of 30 and above is considered obese.   . Maintain normal blood lipids and cholesterol levels by exercising and minimizing your intake of saturated fat. Eat a balanced diet with plenty of fruit and vegetables. Blood tests for lipids and cholesterol should begin at age 53 and be repeated every 5 years. If your lipid or cholesterol levels are high,  you are over 50, or you are at high risk for heart disease, you may need your cholesterol levels checked more frequently. Ongoing high lipid and cholesterol levels should be treated with medicines if diet and exercise are not working.  . If you smoke, find out from your health care provider how to quit. If you do not use tobacco, please do not start.  . If you choose to drink alcohol, please do not consume more than 2 drinks per day. One drink is considered to be 12 ounces (355 mL) of beer, 5 ounces (148 mL) of wine, or 1.5 ounces (44 mL) of liquor.  . If you are  53-9 years old, ask your health care provider if you should take aspirin to prevent strokes.  . Use sunscreen. Apply sunscreen liberally and repeatedly throughout the day. You should seek shade when your shadow is shorter than you. Protect yourself by wearing long sleeves, pants, a wide-brimmed hat, and sunglasses year round, whenever you are outdoors.  . Once a month, do a whole body skin exam, using a mirror to look at the skin on your back. Tell your health care provider of new moles, moles that have irregular borders, moles that are larger than a pencil eraser, or moles that have changed in shape or color.

## 2016-04-11 NOTE — Progress Notes (Signed)
Hypoxic respiratory failure requiring supplemental O2 use. Here for follow-up. Still on nebs BID and using O2.  She was feeling better until having a panic attack recently.  She felt jittery after neb use, also with contribution with from other stressors with family interactions. Discussed with patient.  Also d/w pt pulmonary rehab and pulmonary referral. See orders.  She isn't going to church.  She has been staying in her room, not going out a lot since the panic attack, at least for a few days. Yesterday she was able to "get her groove back" and felt some better today.    Still with resting tachycardia in spite of O2 use.    PMH and SH reviewed  ROS: Per HPI unless specifically indicated in ROS section   Meds, vitals, and allergies reviewed.   nad, on O2 via nasal cannula ncat Benign appearing SK noted on posterior scalp.  Mmm rrr ctab abd soft Ext w/o edema  Without supplemental oxygen her pulse ox drops to 84% with walking slowly. Her heart rate increased to the 120s with slow walking. This improved with rest and restart oxygen. Discussed with patient. She did not feel poorly when her O2 sat dropped.

## 2016-04-12 ENCOUNTER — Telehealth: Payer: Self-pay | Admitting: Family Medicine

## 2016-04-12 NOTE — Assessment & Plan Note (Signed)
With supplemental O2 need persisting. Refer to pulmonary, refer to pulmonary rehabilitation. Continue O2 via nasal cannula. She has adequate sats when using 2 L. She desaturates when off O2. See above. All discussed with patient. She has felt jittery some with her nebulizer use. Okay to try to cut back to once a day to see if she can tolerate that better. At this point still okay for outpatient follow-up and she will update me as needed. >25 minutes spent in face to face time with patient, >50% spent in counselling or coordination of care.

## 2016-04-12 NOTE — Telephone Encounter (Signed)
Gabapentin last refilled on 01/11/2016 #270 +1refill, last OV yesterday. Norco/vicodin last refilled 03/15/16 #120, ok to refill?

## 2016-04-13 ENCOUNTER — Telehealth: Payer: Self-pay

## 2016-04-13 DIAGNOSIS — R0902 Hypoxemia: Secondary | ICD-10-CM

## 2016-04-13 NOTE — Telephone Encounter (Signed)
Daughter advised.  Rx left at front desk for pick up.

## 2016-04-13 NOTE — Telephone Encounter (Signed)
Patient called about her rx.  Please call patient back at [phone].

## 2016-04-13 NOTE — Telephone Encounter (Signed)
Mandy with [facility] left v/m; pts daughter, Jackquline Bosch contacted [facility] wanting to change oxygen carriers from [facility] to [facility]. Pt not happy with [facility] services. [facility] can provide oxygen services; pt wants portable concentrator; Cameron Sprang is on Epic and can pull order from Epic, order DME for home use only with oxygen order;put in standard templet and comment section put O2 concentrator and POC. Mandy request cb.

## 2016-04-13 NOTE — Telephone Encounter (Signed)
Printed vicodin.  She should have refill on gabapentin.  Thanks.

## 2016-04-13 NOTE — Telephone Encounter (Signed)
Ordered. Thanks

## 2016-04-18 ENCOUNTER — Encounter: Payer: PPO | Admitting: Family Medicine

## 2016-04-19 ENCOUNTER — Telehealth: Payer: Self-pay

## 2016-04-19 DIAGNOSIS — J439 Emphysema, unspecified: Secondary | ICD-10-CM | POA: Diagnosis not present

## 2016-04-19 DIAGNOSIS — J9601 Acute respiratory failure with hypoxia: Secondary | ICD-10-CM | POA: Diagnosis not present

## 2016-04-19 DIAGNOSIS — J449 Chronic obstructive pulmonary disease, unspecified: Secondary | ICD-10-CM | POA: Diagnosis not present

## 2016-04-19 NOTE — Telephone Encounter (Signed)
Pt's daughter Tye Maryland called states that her mother is getting worse she said she gets out of breath with walking 65ft and putting on a gown with oxygen on, she drags going to the bathroom. Pulmonary appt is not until 05/09 and she's on the waiting list. She said pt is having trouble with her right hip sometimes she sees her rubbing it. She said she thinks patient is getting depressed/anxious and she doesn't know how to help her. Tye Maryland was calling so maybe someone can go to their house for pulmonary "rehab". Please advise.

## 2016-04-19 NOTE — Telephone Encounter (Signed)
Megan Peterson, please check to see if her pulmonary appointment can get moved up.  See below.  Please let me know. Thanks.

## 2016-04-21 ENCOUNTER — Ambulatory Visit (INDEPENDENT_AMBULATORY_CARE_PROVIDER_SITE_OTHER): Payer: PPO | Admitting: Family Medicine

## 2016-04-21 ENCOUNTER — Emergency Department: Payer: PPO

## 2016-04-21 ENCOUNTER — Inpatient Hospital Stay
Admission: EM | Admit: 2016-04-21 | Discharge: 2016-04-23 | DRG: 871 | Disposition: A | Discharge status: Home / Self Care | Payer: PPO | Attending: Specialist | Admitting: Specialist

## 2016-04-21 ENCOUNTER — Encounter: Payer: Self-pay | Admitting: Family Medicine

## 2016-04-21 ENCOUNTER — Encounter: Payer: Self-pay | Admitting: *Deleted

## 2016-04-21 VITALS — BP 122/70 | HR 120 | Temp 98.3°F

## 2016-04-21 DIAGNOSIS — Z811 Family history of alcohol abuse and dependence: Secondary | ICD-10-CM

## 2016-04-21 DIAGNOSIS — Z79891 Long term (current) use of opiate analgesic: Secondary | ICD-10-CM

## 2016-04-21 DIAGNOSIS — G8929 Other chronic pain: Secondary | ICD-10-CM | POA: Diagnosis not present

## 2016-04-21 DIAGNOSIS — E785 Hyperlipidemia, unspecified: Secondary | ICD-10-CM | POA: Diagnosis present

## 2016-04-21 DIAGNOSIS — J439 Emphysema, unspecified: Secondary | ICD-10-CM

## 2016-04-21 DIAGNOSIS — M8588 Other specified disorders of bone density and structure, other site: Secondary | ICD-10-CM | POA: Diagnosis present

## 2016-04-21 DIAGNOSIS — Z825 Family history of asthma and other chronic lower respiratory diseases: Secondary | ICD-10-CM

## 2016-04-21 DIAGNOSIS — F419 Anxiety disorder, unspecified: Secondary | ICD-10-CM | POA: Diagnosis present

## 2016-04-21 DIAGNOSIS — Z803 Family history of malignant neoplasm of breast: Secondary | ICD-10-CM

## 2016-04-21 DIAGNOSIS — Y95 Nosocomial condition: Secondary | ICD-10-CM | POA: Diagnosis present

## 2016-04-21 DIAGNOSIS — Z885 Allergy status to narcotic agent status: Secondary | ICD-10-CM | POA: Diagnosis not present

## 2016-04-21 DIAGNOSIS — Z823 Family history of stroke: Secondary | ICD-10-CM | POA: Diagnosis not present

## 2016-04-21 DIAGNOSIS — Z9981 Dependence on supplemental oxygen: Secondary | ICD-10-CM

## 2016-04-21 DIAGNOSIS — J189 Pneumonia, unspecified organism: Secondary | ICD-10-CM

## 2016-04-21 DIAGNOSIS — J440 Chronic obstructive pulmonary disease with acute lower respiratory infection: Secondary | ICD-10-CM | POA: Diagnosis present

## 2016-04-21 DIAGNOSIS — Z833 Family history of diabetes mellitus: Secondary | ICD-10-CM | POA: Diagnosis not present

## 2016-04-21 DIAGNOSIS — I10 Essential (primary) hypertension: Secondary | ICD-10-CM | POA: Diagnosis present

## 2016-04-21 DIAGNOSIS — R0602 Shortness of breath: Secondary | ICD-10-CM | POA: Diagnosis not present

## 2016-04-21 DIAGNOSIS — J441 Chronic obstructive pulmonary disease with (acute) exacerbation: Secondary | ICD-10-CM

## 2016-04-21 DIAGNOSIS — J9611 Chronic respiratory failure with hypoxia: Secondary | ICD-10-CM

## 2016-04-21 DIAGNOSIS — Z888 Allergy status to other drugs, medicaments and biological substances status: Secondary | ICD-10-CM

## 2016-04-21 DIAGNOSIS — Z90710 Acquired absence of both cervix and uterus: Secondary | ICD-10-CM | POA: Diagnosis not present

## 2016-04-21 DIAGNOSIS — M549 Dorsalgia, unspecified: Secondary | ICD-10-CM | POA: Diagnosis present

## 2016-04-21 DIAGNOSIS — Z85828 Personal history of other malignant neoplasm of skin: Secondary | ICD-10-CM | POA: Diagnosis not present

## 2016-04-21 DIAGNOSIS — Z87891 Personal history of nicotine dependence: Secondary | ICD-10-CM

## 2016-04-21 DIAGNOSIS — G629 Polyneuropathy, unspecified: Secondary | ICD-10-CM | POA: Diagnosis present

## 2016-04-21 DIAGNOSIS — A419 Sepsis, unspecified organism: Secondary | ICD-10-CM | POA: Diagnosis not present

## 2016-04-21 DIAGNOSIS — Z79899 Other long term (current) drug therapy: Secondary | ICD-10-CM

## 2016-04-21 DIAGNOSIS — Z8249 Family history of ischemic heart disease and other diseases of the circulatory system: Secondary | ICD-10-CM

## 2016-04-21 DIAGNOSIS — R000 Tachycardia, unspecified: Secondary | ICD-10-CM | POA: Diagnosis not present

## 2016-04-21 DIAGNOSIS — Z800 Family history of malignant neoplasm of digestive organs: Secondary | ICD-10-CM | POA: Diagnosis not present

## 2016-04-21 HISTORY — DX: Chronic respiratory failure with hypoxia: J96.11

## 2016-04-21 HISTORY — DX: Chronic obstructive pulmonary disease with (acute) exacerbation: J44.1

## 2016-04-21 LAB — LACTIC ACID, PLASMA: LACTIC ACID, VENOUS: 3.7 mmol/L — AB (ref 0.5–1.9)

## 2016-04-21 LAB — CBC WITH DIFFERENTIAL/PLATELET
BASOS ABS: 0.2 10*3/uL — AB (ref 0–0.1)
BASOS PCT: 2 %
Eosinophils Absolute: 0.4 10*3/uL (ref 0–0.7)
Eosinophils Relative: 3 %
HEMATOCRIT: 34.5 % — AB (ref 35.0–47.0)
HEMOGLOBIN: 11.6 g/dL — AB (ref 12.0–16.0)
LYMPHS PCT: 16 %
Lymphs Abs: 2.2 10*3/uL (ref 1.0–3.6)
MCH: 30 pg (ref 26.0–34.0)
MCHC: 33.5 g/dL (ref 32.0–36.0)
MCV: 89.8 fL (ref 80.0–100.0)
MONOS PCT: 15 %
Monocytes Absolute: 2 10*3/uL — ABNORMAL HIGH (ref 0.2–0.9)
NEUTROS ABS: 8.7 10*3/uL — AB (ref 1.4–6.5)
NEUTROS PCT: 64 %
Platelets: 587 10*3/uL — ABNORMAL HIGH (ref 150–440)
RBC: 3.85 MIL/uL (ref 3.80–5.20)
RDW: 13 % (ref 11.5–14.5)
WBC: 13.5 10*3/uL — ABNORMAL HIGH (ref 3.6–11.0)

## 2016-04-21 LAB — FIBRIN DERIVATIVES D-DIMER (ARMC ONLY): Fibrin derivatives D-dimer (ARMC): 1537.92 — ABNORMAL HIGH (ref 0.00–499.00)

## 2016-04-21 LAB — MAGNESIUM: Magnesium: 2.1 mg/dL (ref 1.7–2.4)

## 2016-04-21 LAB — BASIC METABOLIC PANEL
ANION GAP: 9 (ref 5–15)
BUN: 15 mg/dL (ref 6–20)
CHLORIDE: 98 mmol/L — AB (ref 101–111)
CO2: 29 mmol/L (ref 22–32)
Calcium: 9.1 mg/dL (ref 8.9–10.3)
Creatinine, Ser: 0.81 mg/dL (ref 0.44–1.00)
GFR calc non Af Amer: >60 mL/min (ref >60)
Glucose, Bld: 97 mg/dL (ref 65–99)
POTASSIUM: 4.8 mmol/L (ref 3.5–5.1)
Sodium: 136 mmol/L (ref 135–145)

## 2016-04-21 LAB — TROPONIN I

## 2016-04-21 MED ORDER — METHYLPREDNISOLONE SODIUM SUCC 125 MG IJ SOLR
60.0000 mg | Freq: Four times a day (QID) | INTRAMUSCULAR | Status: DC
  Administered 2016-04-22 (×2): 60 mg via INTRAVENOUS
  Filled 2016-04-21 (×2): qty 2

## 2016-04-21 MED ORDER — HYDROCODONE-ACETAMINOPHEN 5-325 MG PO TABS
1.0000 | ORAL_TABLET | Freq: Four times a day (QID) | ORAL | Status: DC | PRN
  Administered 2016-04-21 – 2016-04-23 (×5): 1 via ORAL
  Filled 2016-04-21 (×5): qty 1

## 2016-04-21 MED ORDER — SODIUM CHLORIDE 0.9 % IV SOLN
500.0000 mg | INTRAVENOUS | Status: DC
  Administered 2016-04-22: 500 mg via INTRAVENOUS
  Filled 2016-04-21 (×2): qty 500

## 2016-04-21 MED ORDER — SODIUM CHLORIDE 0.9 % IV BOLUS (SEPSIS)
1000.0000 mL | Freq: Once | INTRAVENOUS | Status: AC
  Administered 2016-04-21: 1000 mL via INTRAVENOUS

## 2016-04-21 MED ORDER — ACETAMINOPHEN 325 MG PO TABS: 650.0000 mg | ORAL_TABLET | Freq: Four times a day (QID) | ORAL | Status: DC | PRN

## 2016-04-21 MED ORDER — DEXTROSE 5 % IV SOLN
2.0000 g | Freq: Two times a day (BID) | INTRAVENOUS | Status: DC
  Administered 2016-04-22 – 2016-04-23 (×3): 2 g via INTRAVENOUS
  Filled 2016-04-21 (×4): qty 2

## 2016-04-21 MED ORDER — METHYLPREDNISOLONE SODIUM SUCC 125 MG IJ SOLR
60.0000 mg | Freq: Once | INTRAMUSCULAR | Status: AC
  Administered 2016-04-21: 60 mg via INTRAVENOUS
  Filled 2016-04-21: qty 2

## 2016-04-21 MED ORDER — IPRATROPIUM-ALBUTEROL 0.5-2.5 (3) MG/3ML IN SOLN
3.0000 mL | Freq: Once | RESPIRATORY_TRACT | Status: AC
  Administered 2016-04-21: 3 mL via RESPIRATORY_TRACT
  Filled 2016-04-21: qty 3

## 2016-04-21 MED ORDER — ONDANSETRON HCL 4 MG PO TABS: 4.0000 mg | ORAL_TABLET | Freq: Four times a day (QID) | ORAL | Status: DC | PRN

## 2016-04-21 MED ORDER — DEXTROSE 5 % IV SOLN
500.0000 mg | Freq: Once | INTRAVENOUS | Status: AC
  Administered 2016-04-21: 500 mg via INTRAVENOUS
  Filled 2016-04-21: qty 500

## 2016-04-21 MED ORDER — IPRATROPIUM-ALBUTEROL 0.5-2.5 (3) MG/3ML IN SOLN: 3.0000 mL | RESPIRATORY_TRACT | Status: DC | PRN

## 2016-04-21 MED ORDER — ACETAMINOPHEN 650 MG RE SUPP: 650.0000 mg | Freq: Four times a day (QID) | RECTAL | Status: DC | PRN

## 2016-04-21 MED ORDER — VANCOMYCIN HCL IN DEXTROSE 1-5 GM/200ML-% IV SOLN
1000.0000 mg | Freq: Once | INTRAVENOUS | Status: AC
  Administered 2016-04-21: 1000 mg via INTRAVENOUS
  Filled 2016-04-21: qty 200

## 2016-04-21 MED ORDER — ONDANSETRON HCL 4 MG/2ML IJ SOLN: 4.0000 mg | Freq: Four times a day (QID) | INTRAMUSCULAR | Status: DC | PRN

## 2016-04-21 MED ORDER — IOPAMIDOL (ISOVUE-370) INJECTION 76%
75.0000 mL | Freq: Once | INTRAVENOUS | Status: AC | PRN
  Administered 2016-04-21: 75 mL via INTRAVENOUS

## 2016-04-21 MED ORDER — ENOXAPARIN SODIUM 40 MG/0.4ML ~~LOC~~ SOLN
40.0000 mg | SUBCUTANEOUS | Status: DC
  Administered 2016-04-22: 40 mg via SUBCUTANEOUS
  Filled 2016-04-21: qty 0.4

## 2016-04-21 MED ORDER — SODIUM CHLORIDE 0.9% FLUSH
3.0000 mL | Freq: Two times a day (BID) | INTRAVENOUS | Status: DC
  Administered 2016-04-21 – 2016-04-23 (×4): 3 mL via INTRAVENOUS

## 2016-04-21 MED ORDER — GUAIFENESIN-DM 100-10 MG/5ML PO SYRP
5.0000 mL | ORAL_SOLUTION | ORAL | Status: DC | PRN
  Administered 2016-04-23: 5 mL via ORAL
  Filled 2016-04-21: qty 5

## 2016-04-21 MED ORDER — CEFEPIME-DEXTROSE 1 GM/50ML IV SOLR
1.0000 g | Freq: Once | INTRAVENOUS | Status: AC
  Administered 2016-04-21: 1 g via INTRAVENOUS
  Filled 2016-04-21: qty 50

## 2016-04-21 MED ORDER — GABAPENTIN 400 MG PO CAPS
800.0000 mg | ORAL_CAPSULE | Freq: Three times a day (TID) | ORAL | Status: DC
Start: 2016-04-21 — End: 2016-04-23
  Administered 2016-04-21 – 2016-04-23 (×6): 800 mg via ORAL
  Filled 2016-04-21 (×6): qty 2

## 2016-04-21 MED ORDER — BENZONATATE 100 MG PO CAPS: 200.0000 mg | ORAL_CAPSULE | Freq: Three times a day (TID) | ORAL | Status: DC | PRN

## 2016-04-21 MED ORDER — GUAIFENESIN ER 600 MG PO TB12
600.0000 mg | ORAL_TABLET | Freq: Two times a day (BID) | ORAL | Status: DC
Start: 2016-04-21 — End: 2016-04-23
  Administered 2016-04-21 – 2016-04-23 (×4): 600 mg via ORAL
  Filled 2016-04-21 (×4): qty 1

## 2016-04-21 MED ORDER — ALBUTEROL SULFATE (2.5 MG/3ML) 0.083% IN NEBU
5.0000 mg | INHALATION_SOLUTION | RESPIRATORY_TRACT | Status: DC
  Administered 2016-04-21 – 2016-04-22 (×4): 5 mg via RESPIRATORY_TRACT
  Filled 2016-04-21 (×5): qty 6

## 2016-04-21 NOTE — Assessment & Plan Note (Signed)
I called charge at [facility] ER about pending arrival via car.  I increased her O2 to 3 L with some improvement in pulse ox but persistently tachy.   Needs ER eval given tachy HR and global dec in BS.  She agrees.  Family driving her directly to ER.  Okay to go via car given the short trip and the improved O2 on 3L O2.

## 2016-04-21 NOTE — Patient Instructions (Signed)
Go to ER at [facility]. We'll call ahead.  Take care.  Glad to see you.

## 2016-04-21 NOTE — ED Triage Notes (Signed)
Pt to triage via wheelchair.  Pt sent from dr duncan's office with tachcarydia. And sob.  No  Chest pain.  Pt on 3 liters oxygen.  np n/v/d.  Pt alert.  Speech clear.

## 2016-04-21 NOTE — Progress Notes (Signed)
History of hypoxic respiratory failure requiring supplemental O2 use at baseline.  Unfortunately her breathing has been worse the last 3 days.  No fevers but felt cold, even under a blanket.  Some cough.  Some sputum, clear.  More wheeze in the last few days, prev was none.  Still using inhalers/neb machine.  More SOB and sig less tolerance for exertion.  SOB walking to BR at home. This is a clear change for her.  PMH and SH reviewed  ROS: Per HPI unless specifically indicated in ROS section   Meds, vitals, and allergies reviewed.   nad, On O2 ncat Mmm Neck supple, no LA Tachy, regular Dec BS globally w/o wheeze noted Abd soft Ext w/o edema  Initial pulse ox was 89%. She was tachycardic to around 120 initially. Recheck pulse ox ~95% on 3L but her tachycardia continued, still regular at around 120. This was at rest, without exertion.

## 2016-04-21 NOTE — Progress Notes (Addendum)
Pharmacy Antibiotic Note  Megan Peterson is a 76 y.o. female admitted on 04/21/2016 with pneumonia.  Pharmacy has been consulted for vancomycin dosing.  Plan: Vancomycin 1000mg  IV x1 given in the ED.  Vancomycin 500mg  IV every 18 hours.  Goal trough 15-20 mcg/mL.Trough ordered before the fifth dose.   Cefepime 2 grams q 12 hours ordered.  Height: 4\' 11"  (149.9 cm) Weight: 122 lb (55.3 kg) IBW/kg (Calculated) : 43.2  Temp (24hrs), Avg:98.2 F (36.8 C), Min:98.1 F (36.7 C), Max:98.3 F (36.8 C)   Recent Labs Lab 04/21/16 1638  WBC 13.5*  CREATININE 0.81    Estimated Creatinine Clearance: 46.2 mL/min (by C-G formula based on SCr of 0.81 mg/dL).    Allergies  Allergen Reactions  . Buspar [Buspirone] Other (See Comments)    Dizzy and nauseated.  Marland Kitchen Cymbalta [Duloxetine Hcl] Other (See Comments)    fatigue  . Lexapro [Escitalopram Oxalate] Other (See Comments)    Per patient, intolerant  . Meperidine Hcl Other (See Comments)    unknown  . Morphine And Related Other (See Comments)    Vomiting   . Statins Other (See Comments)    myalgias    Antimicrobials this admission: Cefepime 4/20 >> Azithromycin 4/20>> Vancomycin 4/20 >>   Microbiology results: 4/20 MRSA PCR: recommend collecting   Thank you for allowing pharmacy to be a part of this patient's care.  Loree Fee, PharmD 04/21/2016 8:13 PM

## 2016-04-21 NOTE — Progress Notes (Signed)
Pre visit review using our clinic review tool, if applicable. No additional management support is needed unless otherwise documented below in the visit note. 

## 2016-04-21 NOTE — Telephone Encounter (Signed)
Pulmonary Appt moved up to next Tuesday, 04/25/16 at 9:15am arrival time with Dr Elsworth Soho.
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
## 2016-04-22 LAB — BASIC METABOLIC PANEL
Anion gap: 11 (ref 5–15)
BUN: 12 mg/dL (ref 6–20)
CALCIUM: 8.1 mg/dL — AB (ref 8.9–10.3)
CHLORIDE: 106 mmol/L (ref 101–111)
CO2: 22 mmol/L (ref 22–32)
CREATININE: 0.65 mg/dL (ref 0.44–1.00)
GFR calc Af Amer: >60 mL/min (ref >60)
GFR calc non Af Amer: >60 mL/min (ref >60)
Glucose, Bld: 215 mg/dL — ABNORMAL HIGH (ref 65–99)
Potassium: 3.9 mmol/L (ref 3.5–5.1)
Sodium: 139 mmol/L (ref 135–145)

## 2016-04-22 LAB — CBC
HCT: 29.8 % — ABNORMAL LOW (ref 35.0–47.0)
Hemoglobin: 9.8 g/dL — ABNORMAL LOW (ref 12.0–16.0)
MCH: 29.6 pg (ref 26.0–34.0)
MCHC: 33.1 g/dL (ref 32.0–36.0)
MCV: 89.4 fL (ref 80.0–100.0)
PLATELETS: 484 10*3/uL — AB (ref 150–440)
RBC: 3.33 MIL/uL — ABNORMAL LOW (ref 3.80–5.20)
RDW: 13.1 % (ref 11.5–14.5)
WBC: 9.5 10*3/uL (ref 3.6–11.0)

## 2016-04-22 LAB — MRSA PCR SCREENING: MRSA by PCR: NEGATIVE

## 2016-04-22 LAB — LACTIC ACID, PLASMA: LACTIC ACID, VENOUS: 5.1 mmol/L — AB (ref 0.5–1.9)

## 2016-04-22 MED ORDER — SODIUM CHLORIDE 0.9 % IV BOLUS (SEPSIS)
1500.0000 mL | Freq: Once | INTRAVENOUS | Status: AC
  Administered 2016-04-22: 1500 mL via INTRAVENOUS

## 2016-04-22 MED ORDER — METHYLPREDNISOLONE SODIUM SUCC 125 MG IJ SOLR
60.0000 mg | Freq: Two times a day (BID) | INTRAMUSCULAR | Status: DC
  Administered 2016-04-22 – 2016-04-23 (×2): 60 mg via INTRAVENOUS
  Filled 2016-04-22 (×2): qty 2

## 2016-04-22 MED ORDER — IPRATROPIUM-ALBUTEROL 0.5-2.5 (3) MG/3ML IN SOLN
3.0000 mL | Freq: Four times a day (QID) | RESPIRATORY_TRACT | Status: DC
  Administered 2016-04-22 (×2): 3 mL via RESPIRATORY_TRACT
  Filled 2016-04-22 (×2): qty 3

## 2016-04-22 MED ORDER — SODIUM CHLORIDE 0.9 % IV BOLUS (SEPSIS)
1000.0000 mL | Freq: Once | INTRAVENOUS | Status: AC
  Administered 2016-04-22: 1000 mL via INTRAVENOUS

## 2016-04-22 MED ORDER — SODIUM CHLORIDE 0.9 % IV SOLN
INTRAVENOUS | Status: DC
  Administered 2016-04-22 (×2): via INTRAVENOUS

## 2016-04-22 MED ORDER — BUDESONIDE 0.5 MG/2ML IN SUSP
0.5000 mg | Freq: Two times a day (BID) | RESPIRATORY_TRACT | Status: DC
  Administered 2016-04-22 – 2016-04-23 (×3): 0.5 mg via RESPIRATORY_TRACT
  Filled 2016-04-22 (×3): qty 2

## 2016-04-22 MED ORDER — ORAL CARE MOUTH RINSE
15.0000 mL | Freq: Two times a day (BID) | OROMUCOSAL | Status: DC
  Administered 2016-04-22: 15 mL via OROMUCOSAL

## 2016-04-22 NOTE — Progress Notes (Signed)
[facility] [place] NAME: Megan Peterson    MR#:  [id number]  DATE OF BIRTH:  09/03/1940  SUBJECTIVE:   Here due to sepsis secondary to pneumonia. Feels better. Son at bedside. No fever, shortness of breath is improved.   REVIEW OF SYSTEMS:    Review of Systems  Constitutional: Negative for chills and fever.  HENT: Negative for congestion and tinnitus.   Eyes: Negative for blurred vision and double vision.  Respiratory: Positive for cough and shortness of breath. Negative for wheezing.   Cardiovascular: Negative for chest pain, orthopnea and PND.  Gastrointestinal: Negative for abdominal pain, diarrhea, nausea and vomiting.  Genitourinary: Negative for dysuria and hematuria.  Neurological: Negative for dizziness, sensory change and focal weakness.  All other systems reviewed and are negative.   Nutrition: Heart Healthy Tolerating Diet: Yes Tolerating PT: Ambulatory    DRUG ALLERGIES:   Allergies  Allergen Reactions  . Buspar [Buspirone] Other (See Comments)    Dizzy and nauseated.  Marland Kitchen Cymbalta [Duloxetine Hcl] Other (See Comments)    fatigue  . Lexapro [Escitalopram Oxalate] Other (See Comments)    Per patient, intolerant  . Meperidine Hcl Other (See Comments)    unknown  . Morphine And Related Other (See Comments)    Vomiting   . Statins Other (See Comments)    myalgias    VITALS:  Blood pressure (!) 119/100, pulse (!) 121, temperature 98 F (36.7 C), temperature source Oral, resp. rate 18, height 4' 11" (1.499 m), weight 58.9 kg (129 lb 14.4 oz), SpO2 96 %.  PHYSICAL EXAMINATION:   Physical Exam  GENERAL:  76 y.o.-year-old patient lying in bed in no acute distress.  EYES: Pupils equal, round, reactive to light and accommodation. No scleral icterus. Extraocular muscles intact.  HEENT: Head atraumatic, normocephalic. Oropharynx and nasopharynx clear.  NECK:  Supple, no jugular venous distention. No thyroid enlargement,  no tenderness.  LUNGS: Prolonged Insp. & Exp. phase, no wheezing, rales, rhonchi. No use of accessory muscles of respiration.  CARDIOVASCULAR: S1, S2 normal. No murmurs, rubs, or gallops.  ABDOMEN: Soft, nontender, nondistended. Bowel sounds present. No organomegaly or mass.  EXTREMITIES: No cyanosis, clubbing or edema b/l.    NEUROLOGIC: Cranial nerves II through XII are intact. No focal Motor or sensory deficits b/l.   PSYCHIATRIC: The patient is alert and oriented x 3.  SKIN: No obvious rash, lesion, or ulcer.    LABORATORY PANEL:   CBC  Recent Labs Lab 04/22/16 0319  WBC 9.5  HGB 9.8*  HCT 29.8*  PLT 484*   ------------------------------------------------------------------------------------------------------------------  Chemistries   Recent Labs Lab 04/21/16 1638 04/22/16 0319  NA 136 139  K 4.8 3.9  CL 98* 106  CO2 29 22  GLUCOSE 97 215*  BUN 15 12  CREATININE 0.81 0.65  CALCIUM 9.1 8.1*  MG 2.1  --    ------------------------------------------------------------------------------------------------------------------  Cardiac Enzymes  Recent Labs Lab 04/21/16 1638  TROPONINI <0.03   ------------------------------------------------------------------------------------------------------------------  RADIOLOGY:  Ct Angio Chest Pe W And/or Wo Contrast  Result Date: 04/21/2016 CLINICAL DATA:  Shortness of breath and tachycardia. EXAM: CT ANGIOGRAPHY CHEST WITH CONTRAST TECHNIQUE: Multidetector CT imaging of the chest was performed using the standard protocol during bolus administration of intravenous contrast. Multiplanar CT image reconstructions and MIPs were obtained to evaluate the vascular anatomy. CONTRAST:  75 mL Isovue 370 COMPARISON:  Chest radiograph earlier today and chest CT 03/19/2016 FINDINGS: Cardiovascular: Pulmonary arterial opacification is adequate without evidence  of emboli. There is moderate thoracic aortic atherosclerosis without aneurysm.  Heart size is normal. No pericardial effusion. Mediastinum/Nodes: No enlarged axillary, mediastinal, or hilar lymph nodes. Unchanged 9 mm right paratracheal lymph node. Lungs/Pleura: No pleural effusion or pneumothorax. Emphysema, bronchial wall thickening, and mild bronchiectasis are again noted. There is new, relatively extensive ground-glass opacity in the upper lobes, slightly greater on the left. There is some associated interlobular septal thickening. There is mild atelectasis and scarring in the lung bases. No mass. Upper Abdomen: Unremarkable. Musculoskeletal: Mild thoracic disc degeneration. Review of the MIP images confirms the above findings. IMPRESSION: 1. No evidence of pulmonary emboli. 2. New bilateral upper lobe ground-glass opacities, most likely reflecting acute infection (including both bacterial and atypical organisms) as these were not present on last month's CT. 3. Emphysema. 4.  Aortic Atherosclerosis (ICD10-I70.0). Electronically Signed   By: Logan Bores M.D.   On: 04/21/2016 19:48   Dg Chest Portable 1 View  Result Date: 04/21/2016 CLINICAL DATA:  Tachycardia and shortness of breath today.  COPD. EXAM: PORTABLE CHEST 1 VIEW COMPARISON:  CT chest and single view of the chest 03/19/2016. FINDINGS: The lungs are emphysematous but clear. Heart size is normal. No pneumothorax or pleural fluid. Aortic atherosclerosis noted. IMPRESSION: Emphysema without acute disease. Atherosclerosis. Electronically Signed   By: Inge Rise M.D.   On: 04/21/2016 16:44     ASSESSMENT AND PLAN:   76 year old female with past medical history of anxiety, hypertension, hyperlipidemia, COPD, chronic back pain, neuropathy who presents to the hospital due to shortness of breath and fever and noted to have sepsis secondary to pneumonia.  1. Sepsis-patient met criteria of admission given the elevated lactic acid, leukocytosis and CT chest findings suggestive of pneumonia. -Continue IV antibiotics with  Vanc, Cefepime. Afebrile today, white cell count normalized. Clinically feels better.  2. Pneumonia- suspected HCAP -Continue IV Vancomycin, Cefepmine, follow blood, sputum cultures. - will check MRSA PCR  3. COPD exacerbation-secondary to pneumonia. -Continue IV Solu-Medrol, will place on scheduled DuoNeb nebs, Pulmicort nebs. Patient is already on oxygen at home.  4. Neuropathy-continue gabapentin.  5. Chronic back pain-continue Vicodin as needed.   All the records are reviewed and case discussed with Care Management/Social Worker. Management plans discussed with the patient, family and they are in agreement.  CODE STATUS: Full Code  DVT Prophylaxis: Lovenox  TOTAL TIME TAKING CARE OF THIS PATIENT: 30 minutes.   POSSIBLE D/C IN 1-2 DAYS, DEPENDING ON CLINICAL CONDITION.   Henreitta Leber M.D on 04/22/2016 at 1:03 PM  Between 7am to 6pm - Pager - [phone]  After 6pm go to [email or web address] - Proofreader  [facility]  Office  [phone]  CC: Primary care physician; Elsie Stain, MD

## 2016-04-22 NOTE — Progress Notes (Signed)
Notified MD of critical lactic acid 5.1 from 3.7. Order to infuse another 101mL bolus, then continuous NS at 144mL/hr.

## 2016-04-23 MED ORDER — LEVOFLOXACIN 500 MG PO TABS: 500.0000 mg | ORAL_TABLET | Freq: Every day | ORAL | 0 refills | Status: AC

## 2016-04-23 MED ORDER — PREDNISONE 10 MG PO TABS: ORAL_TABLET | ORAL | 0 refills | Status: DC

## 2016-04-23 MED ORDER — IPRATROPIUM-ALBUTEROL 0.5-2.5 (3) MG/3ML IN SOLN
3.0000 mL | Freq: Four times a day (QID) | RESPIRATORY_TRACT | Status: DC | PRN
  Filled 2016-04-23: qty 3

## 2016-04-23 MED ORDER — ALPRAZOLAM 0.25 MG PO TABS: 0.2500 mg | ORAL_TABLET | Freq: Three times a day (TID) | ORAL | 0 refills | Status: DC | PRN

## 2016-04-23 MED ORDER — METOPROLOL TARTRATE 25 MG PO TABS: 12.5000 mg | ORAL_TABLET | Freq: Two times a day (BID) | ORAL | 1 refills | Status: DC

## 2016-04-23 MED ORDER — IPRATROPIUM-ALBUTEROL 0.5-2.5 (3) MG/3ML IN SOLN
3.0000 mL | Freq: Three times a day (TID) | RESPIRATORY_TRACT | Status: DC
  Administered 2016-04-23: 3 mL via RESPIRATORY_TRACT
  Filled 2016-04-23: qty 3

## 2016-04-23 NOTE — Progress Notes (Signed)
Patient discharge summary reviewed with patient, son and daughter with verbal understanding. Personal O2 concentrator. Rxs given upon discharge. Escorted to personal vehicle via wc by staff.

## 2016-04-23 NOTE — Assessment & Plan Note (Signed)
She has chronic respiratory failure at baseline requiring supplemental O2 use but she has clearly been worse in the past 3 days. Her hypoxia did improve with 3 L of O2 but I was more worried about her concurrent tachycardia at rest that continued in spite of higher O2 use. Discussed with patient. I think she is likely to fail outpatient treatment if she goes home from the clinic. She has a driver here. She has her own supply of supplemental O2. Advised patient to go to the hospital as she was likely going to need evaluation and possible admission. She consented. At this point given the short distance to [facility], the fact that she has her own  supplemental O2 and a driver, it is reasonable for her to go by private car. All discussed with patient. I don't think she needs EMS transport. She agrees. We called ahead to to the ER to let them know that the patient was coming in by private vehicle. I appreciate help all involved. >25 minutes spent in face to face time with patient, >50% spent in counselling or coordination of care.

## 2016-04-23 NOTE — Evaluation (Signed)
Physical Therapy Evaluation Patient Details Name: Megan Peterson MRN: [id number] DOB: [birth year] Today's Date: 04/23/2016   History of Present Illness  Pt admitted for sepsis with complaints of SOB and tachycardia. Pt also with positive pneumonia.   Clinical Impression  Pt is a pleasant 76 year old female who was admitted for sepsis with pneumonia. Pt demonstrates all bed mobility/transfers/ambulation at baseline level. All mobility performed on 2L of O2 with slight decrease in O2 sats with exertion. Family concerned about pt being alone. Heavily discussed home barriers and problem solved with family. Will have daughter available for transportation to appointments. Pt reports she will be starting [facility] program for pulmonary rehab. Pt does not require any further PT needs at this time. Pt will be dc in house and does not require follow up. RN aware. Will dc current orders.       Follow Up Recommendations No PT follow up    Equipment Recommendations  None recommended by PT    Recommendations for Other Services  ([facility])     Precautions / Restrictions Precautions Precautions: Fall Restrictions Weight Bearing Restrictions: No      Mobility  Bed Mobility Overal bed mobility: Independent             General bed mobility comments: safe technique performed  Transfers Overall transfer level: Independent Equipment used: None             General transfer comment: safe technique with upright posture noted. 2L of O2 donned for all mobility  Ambulation/Gait Ambulation/Gait assistance: Independent Ambulation Distance (Feet): 200 Feet Assistive device: None Gait Pattern/deviations: WFL(Within Functional Limits)     General Gait Details: ambulated with fast gait speed around RN station. Safe technique performed with reciprocal gait pattern performed. 2L of O2 donned with sats slightly decreased from 95% to 88% with exertion. With seated rest break, O2 sats improve to  94%.  Stairs            Wheelchair Mobility    Modified Rankin (Stroke Patients Only)       Balance Overall balance assessment: Independent                                           Pertinent Vitals/Pain Pain Assessment: No/denies pain    Home Living Family/patient expects to be discharged to:: Private residence Living Arrangements: Alone Available Help at Discharge: Family Type of Home: House Home Access: Stairs to enter Entrance Stairs-Rails: None Entrance Stairs-Number of Steps: 1 step to enter Home Layout: Multi-level Home Equipment: Bedside commode      Prior Function Level of Independence: Independent         Comments: active and did not use AD prior to admission     Hand Dominance        Extremity/Trunk Assessment   Upper Extremity Assessment Upper Extremity Assessment: Overall WFL for tasks assessed    Lower Extremity Assessment Lower Extremity Assessment: Overall WFL for tasks assessed       Communication   Communication: No difficulties  Cognition Arousal/Alertness: Awake/alert Behavior During Therapy: WFL for tasks assessed/performed Overall Cognitive Status: Within Functional Limits for tasks assessed                                        General Comments  Exercises Other Exercises Other Exercises: educated on energy conservation as she is home alone. Discussed home environment set up and [facility] program. Pt is new O2 user and is hopeful to dc off O2. Still requires for mobility at this time. Discussed frequency of ambulation and performing ADLs with O2. Son had further questions regarding O2 extender. Referred to discuss with RN.   Assessment/Plan    PT Assessment Patent does not need any further PT services  PT Problem List         PT Treatment Interventions      PT Goals (Current goals can be found in the Care Plan section)  Acute Rehab PT Goals Patient Stated Goal: to go  home PT Goal Formulation: All assessment and education complete, DC therapy Time For Goal Achievement: 04/23/16 Potential to Achieve Goals: Good    Frequency     Barriers to discharge        Co-evaluation               End of Session Equipment Utilized During Treatment: Oxygen;Gait belt Activity Tolerance: Patient tolerated treatment well Patient left: in bed Nurse Communication: Mobility status PT Visit Diagnosis: Other abnormalities of gait and mobility (R26.89)    Time: 4081-4481 PT Time Calculation (min) (ACUTE ONLY): 15 min   Charges:   PT Evaluation $PT Eval Low Complexity: 1 Procedure PT Treatments $Self Care/Home Management: 8-22   PT G Codes:        Greggory Stallion, PT, DPT [phone]   Megan Peterson 04/23/2016, 12:27 PM
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
## 2016-04-25 ENCOUNTER — Encounter: Payer: Self-pay | Admitting: Pulmonary Disease

## 2016-04-25 ENCOUNTER — Ambulatory Visit (INDEPENDENT_AMBULATORY_CARE_PROVIDER_SITE_OTHER): Payer: PPO | Admitting: Pulmonary Disease

## 2016-04-25 DIAGNOSIS — J441 Chronic obstructive pulmonary disease with (acute) exacerbation: Secondary | ICD-10-CM | POA: Diagnosis not present

## 2016-04-25 DIAGNOSIS — J189 Pneumonia, unspecified organism: Secondary | ICD-10-CM | POA: Diagnosis not present

## 2016-04-25 DIAGNOSIS — J9611 Chronic respiratory failure with hypoxia: Secondary | ICD-10-CM | POA: Diagnosis not present

## 2016-04-25 DIAGNOSIS — J479 Bronchiectasis, uncomplicated: Secondary | ICD-10-CM | POA: Diagnosis not present

## 2016-04-25 MED ORDER — UMECLIDINIUM-VILANTEROL 62.5-25 MCG/INH IN AEPB: 1.0000 | INHALATION_SPRAY | Freq: Every day | RESPIRATORY_TRACT | 0 refills | Status: DC

## 2016-04-25 NOTE — Assessment & Plan Note (Signed)
Complete course of prednisone Trial of ANORO once daily - she will call for prescription of this works Referral to pulmonary rehabilitation

## 2016-04-25 NOTE — Addendum Note (Signed)
Addended by: Valerie Salts on: 04/25/2016 10:19 AM   Modules accepted: Orders

## 2016-04-25 NOTE — Assessment & Plan Note (Signed)
She continues to need oxygen during exertion. I have asked her to stay off oxygen during the daytime while at rest -we'll reassess in 6 weeks
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
## 2016-04-25 NOTE — Patient Instructions (Addendum)
Trial of ANORO once daily - she will call for prescription of this works Referral to pulmonary rehabilitation  Use oxygen while walking and during sleep Okay to stay off oxygen at rest  Breathing test next visit

## 2016-04-25 NOTE — Addendum Note (Signed)
Addended by: Valerie Salts on: 04/25/2016 10:24 AM   Modules accepted: Orders

## 2016-04-25 NOTE — Progress Notes (Signed)
Patient seen in the office today and instructed on use of Anoro.  Patient expressed understanding and demonstrated technique. Benetta Spar [facility] 04/25/16

## 2016-04-25 NOTE — Assessment & Plan Note (Signed)
Counts for the right lower lobe crackles noted on exam

## 2016-04-25 NOTE — Assessment & Plan Note (Signed)
Complete course of Levaquin

## 2016-04-26 LAB — CULTURE, BLOOD (ROUTINE X 2)
Culture: NO GROWTH
Culture: NO GROWTH
SPECIAL REQUESTS: ADEQUATE

## 2016-04-27 ENCOUNTER — Institutional Professional Consult (permissible substitution): Payer: PPO | Admitting: Pulmonary Disease

## 2016-04-27 ENCOUNTER — Institutional Professional Consult (permissible substitution): Payer: PPO | Admitting: Internal Medicine

## 2016-04-28 ENCOUNTER — Telehealth: Payer: Self-pay | Admitting: Pulmonary Disease

## 2016-04-28 MED ORDER — UMECLIDINIUM-VILANTEROL 62.5-25 MCG/INH IN AEPB: 1.0000 | INHALATION_SPRAY | Freq: Every day | RESPIRATORY_TRACT | 6 refills | Status: DC

## 2016-04-28 NOTE — Telephone Encounter (Signed)
Called and spoke with pt and she is aware of refill that has been sent to the pharmacy for anoro.  Nothing further is needed.

## 2016-05-01 ENCOUNTER — Encounter: Payer: Self-pay | Admitting: Family Medicine

## 2016-05-01 ENCOUNTER — Ambulatory Visit (INDEPENDENT_AMBULATORY_CARE_PROVIDER_SITE_OTHER): Payer: PPO | Admitting: Family Medicine

## 2016-05-01 VITALS — BP 118/62 | HR 84 | Temp 97.9°F | Wt 125.5 lb

## 2016-05-01 DIAGNOSIS — D649 Anemia, unspecified: Secondary | ICD-10-CM

## 2016-05-01 DIAGNOSIS — R000 Tachycardia, unspecified: Secondary | ICD-10-CM

## 2016-05-01 DIAGNOSIS — J9611 Chronic respiratory failure with hypoxia: Secondary | ICD-10-CM | POA: Diagnosis not present

## 2016-05-01 DIAGNOSIS — J189 Pneumonia, unspecified organism: Secondary | ICD-10-CM | POA: Diagnosis not present

## 2016-05-01 DIAGNOSIS — J439 Emphysema, unspecified: Secondary | ICD-10-CM

## 2016-05-01 HISTORY — DX: Tachycardia, unspecified: R00.0

## 2016-05-01 LAB — CBC WITH DIFFERENTIAL/PLATELET
BASOS PCT: 0.7 % (ref 0.0–3.0)
Basophils Absolute: 0.1 10*3/uL (ref 0.0–0.1)
Eosinophils Absolute: 0.9 10*3/uL — ABNORMAL HIGH (ref 0.0–0.7)
Eosinophils Relative: 5.5 % — ABNORMAL HIGH (ref 0.0–5.0)
HEMATOCRIT: 38 % (ref 36.0–46.0)
Hemoglobin: 12.4 g/dL (ref 12.0–15.0)
LYMPHS ABS: 2.9 10*3/uL (ref 0.7–4.0)
Lymphocytes Relative: 17.3 % (ref 12.0–46.0)
MCHC: 32.6 g/dL (ref 30.0–36.0)
MCV: 91.5 fl (ref 78.0–100.0)
MONO ABS: 2.4 10*3/uL — AB (ref 0.1–1.0)
Monocytes Relative: 14.2 % — ABNORMAL HIGH (ref 3.0–12.0)
NEUTROS ABS: 10.4 10*3/uL — AB (ref 1.4–7.7)
Neutrophils Relative %: 62.3 % (ref 43.0–77.0)
PLATELETS: 334 10*3/uL (ref 150.0–400.0)
RBC: 4.15 Mil/uL (ref 3.87–5.11)
RDW: 13.8 % (ref 11.5–15.5)
WBC: 16.7 10*3/uL — ABNORMAL HIGH (ref 4.0–10.5)

## 2016-05-01 LAB — BASIC METABOLIC PANEL
BUN: 20 mg/dL (ref 6–23)
CHLORIDE: 98 meq/L (ref 96–112)
CO2: 33 meq/L — AB (ref 19–32)
Calcium: 9.4 mg/dL (ref 8.4–10.5)
Creatinine, Ser: 0.94 mg/dL (ref 0.40–1.20)
GFR: 61.72 mL/min (ref >60.00)
GLUCOSE: 94 mg/dL (ref 70–99)
POTASSIUM: 5.9 meq/L — AB (ref 3.5–5.1)
Sodium: 136 mEq/L (ref 135–145)

## 2016-05-01 MED ORDER — ALBUTEROL SULFATE (2.5 MG/3ML) 0.083% IN NEBU: 2.5000 mg | INHALATION_SOLUTION | Freq: Four times a day (QID) | RESPIRATORY_TRACT | 1 refills | Status: DC | PRN

## 2016-05-01 MED ORDER — TIOTROPIUM BROMIDE MONOHYDRATE 1.25 MCG/ACT IN AERS: 2.0000 | INHALATION_SPRAY | Freq: Every day | RESPIRATORY_TRACT | 5 refills | Status: DC

## 2016-05-01 NOTE — Assessment & Plan Note (Signed)
Pulmonary follow-up pending. Has follow-up with pulmonary rehabilitation. Change Combivent nebulizer to twice a day when necessary in the meantime. Start Spiriva. After about 1 week stop Combivent and change over to playing albuterol nebs as needed. Rationale discussed with patient. She agrees. Okay for outpatient follow-up.

## 2016-05-01 NOTE — Assessment & Plan Note (Signed)
Has follow-up with pulmonary pending. Off O2 when at rest during the day now.
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
## 2016-05-01 NOTE — Progress Notes (Signed)
Pre visit review using our clinic review tool, if applicable. No additional management support is needed unless otherwise documented below in the visit note. 

## 2016-05-01 NOTE — Assessment & Plan Note (Signed)
Sounds to be resolved. Clearly improved.

## 2016-05-01 NOTE — Patient Instructions (Addendum)
See if you can do everything on your own.  If so, then likely reasonable to try moving back home.   Start using spiriva 2 puffs once a day.  If needed, then use combivent twice a day for 1 week.  You may need combivent less when you start spiriva.  After 1 week, stop combivent and change to plain albuterol neb.   Go to the lab on the way out.  We'll contact you with your lab report. Keep taking the metoprolol in the meantime.  Take care.  Glad to see you.  Update me as needed.

## 2016-05-01 NOTE — Assessment & Plan Note (Signed)
Improved with low-dose metoprolol. Continue as is. She agrees.

## 2016-05-02 ENCOUNTER — Other Ambulatory Visit: Payer: Self-pay | Admitting: Family Medicine

## 2016-05-02 DIAGNOSIS — D72829 Elevated white blood cell count, unspecified: Secondary | ICD-10-CM

## 2016-05-02 MED ORDER — SODIUM POLYSTYRENE SULFONATE 15 GM/60ML PO SUSP: 15.0000 g | Freq: Once | ORAL | 0 refills | Status: AC

## 2016-05-03 ENCOUNTER — Other Ambulatory Visit: Payer: PPO

## 2016-05-04 ENCOUNTER — Telehealth: Payer: Self-pay | Admitting: Pulmonary Disease

## 2016-05-04 NOTE — Telephone Encounter (Signed)
PA for Anoro was started on [email or web address]. Key is [facility].

## 2016-05-05 ENCOUNTER — Other Ambulatory Visit (INDEPENDENT_AMBULATORY_CARE_PROVIDER_SITE_OTHER): Payer: PPO

## 2016-05-05 DIAGNOSIS — D72829 Elevated white blood cell count, unspecified: Secondary | ICD-10-CM | POA: Diagnosis not present

## 2016-05-05 LAB — BASIC METABOLIC PANEL
BUN: 14 mg/dL (ref 6–23)
CALCIUM: 9.5 mg/dL (ref 8.4–10.5)
CO2: 29 mEq/L (ref 19–32)
Chloride: 102 mEq/L (ref 96–112)
Creatinine, Ser: 0.92 mg/dL (ref 0.40–1.20)
GFR: 63.27 mL/min (ref >60.00)
GLUCOSE: 94 mg/dL (ref 70–99)
POTASSIUM: 4.9 meq/L (ref 3.5–5.1)
Sodium: 138 mEq/L (ref 135–145)

## 2016-05-05 LAB — CBC WITH DIFFERENTIAL/PLATELET
BASOS PCT: 0.7 % (ref 0.0–3.0)
Basophils Absolute: 0.1 10*3/uL (ref 0.0–0.1)
EOS ABS: 0.6 10*3/uL (ref 0.0–0.7)
EOS PCT: 4.8 % (ref 0.0–5.0)
HEMATOCRIT: 35.2 % — AB (ref 36.0–46.0)
HEMOGLOBIN: 11.5 g/dL — AB (ref 12.0–15.0)
LYMPHS PCT: 16.3 % (ref 12.0–46.0)
Lymphs Abs: 2.1 10*3/uL (ref 0.7–4.0)
MCHC: 32.7 g/dL (ref 30.0–36.0)
MCV: 91.7 fl (ref 78.0–100.0)
MONO ABS: 1.4 10*3/uL — AB (ref 0.1–1.0)
Monocytes Relative: 10.7 % (ref 3.0–12.0)
NEUTROS ABS: 8.6 10*3/uL — AB (ref 1.4–7.7)
Neutrophils Relative %: 67.5 % (ref 43.0–77.0)
PLATELETS: 252 10*3/uL (ref 150.0–400.0)
RBC: 3.84 Mil/uL — ABNORMAL LOW (ref 3.87–5.11)
RDW: 14.4 % (ref 11.5–15.5)
WBC: 12.7 10*3/uL — ABNORMAL HIGH (ref 4.0–10.5)

## 2016-05-05 NOTE — Telephone Encounter (Signed)
Received fax from [place] that Singac was approved until 01/01/17. [facility] on [address] is aware.

## 2016-05-08 ENCOUNTER — Encounter (HOSPITAL_COMMUNITY): Payer: Self-pay

## 2016-05-08 ENCOUNTER — Encounter (HOSPITAL_COMMUNITY)
Admission: RE | Admit: 2016-05-08 | Discharge: 2016-05-08 | Disposition: A | Discharge status: Home / Self Care | Payer: PPO | Source: Ambulatory Visit | Attending: Pulmonary Disease | Admitting: Pulmonary Disease

## 2016-05-08 VITALS — BP 126/72 | HR 83 | Resp 18 | Ht 59.25 in | Wt 126.5 lb

## 2016-05-08 DIAGNOSIS — Z87891 Personal history of nicotine dependence: Secondary | ICD-10-CM | POA: Insufficient documentation

## 2016-05-08 DIAGNOSIS — J441 Chronic obstructive pulmonary disease with (acute) exacerbation: Secondary | ICD-10-CM | POA: Insufficient documentation

## 2016-05-08 DIAGNOSIS — M8580 Other specified disorders of bone density and structure, unspecified site: Secondary | ICD-10-CM | POA: Insufficient documentation

## 2016-05-08 DIAGNOSIS — F419 Anxiety disorder, unspecified: Secondary | ICD-10-CM | POA: Insufficient documentation

## 2016-05-08 DIAGNOSIS — J449 Chronic obstructive pulmonary disease, unspecified: Secondary | ICD-10-CM

## 2016-05-08 DIAGNOSIS — Z85828 Personal history of other malignant neoplasm of skin: Secondary | ICD-10-CM | POA: Insufficient documentation

## 2016-05-08 DIAGNOSIS — E785 Hyperlipidemia, unspecified: Secondary | ICD-10-CM | POA: Insufficient documentation

## 2016-05-08 NOTE — Progress Notes (Signed)
Megan Peterson 76 y.o. female Pulmonary Rehab Orientation Note Patient arrived today in Cardiac and Pulmonary Rehab for orientation to Pulmonary Rehab. He was transported from [employer] via wheel chair by her daughter. He does carry portable oxygen. Per pt, she uses oxygen every night and intermittently during the day. Color good, skin warm and dry. Patient is oriented to time and place. Patient's medical history, psychosocial health, and medications reviewed. Psychosocial assessment reveals pt lives alone. Pt is currently retired. Pt hobbies include reading. Pt reports her stress level is low. Areas of stress/anxiety include Health. Pt does not exhibit signs of depression. PHQ2/9 score 0/na. Pt shows good  coping skills with positive outlook. She is offered emotional support and reassurance. Will continue to monitor and evaluate progress toward psychosocial goal(s) of remaining positive about her diagnosis and the use of oxygen. Physical assessment reveals heart rate is normal, breath sounds clear to auscultation, no wheezes, rales, or rhonchi. Diminished in the bases bilat. Grip strength equal, strong. Distal pulses palpable. No edema. Patient reports she does take medications as prescribed. Patient states she follows a Regular diet. The patient reports no specific efforts to gain or lose weight. Patient's weight will be monitored closely. Demonstration and practice of PLB using pulse oximeter. Patient able to return demonstration satisfactorily. Safety and hand hygiene in the exercise area reviewed with patient. Patient voices understanding of the information reviewed. Department expectations discussed with patient and achievable goals were set. The patient shows enthusiasm about attending the program and we look forward to working with this nice lady. The patient is scheduled for a 6 min walk test on 05/09/16 and to begin exercise on 05/16/16 at 1030.   45 minutes was spent on a variety of activities such as  assessment of the patient, obtaining baseline data including height, weight, BMI, and grip strength, verifying medical history, allergies, and current medications, and teaching patient strategies for performing tasks with less respiratory effort with emphasis on pursed lip breathing.

## 2016-05-09 ENCOUNTER — Encounter (HOSPITAL_COMMUNITY)
Admission: RE | Admit: 2016-05-09 | Discharge: 2016-05-09 | Disposition: A | Discharge status: Home / Self Care | Payer: PPO | Source: Ambulatory Visit | Attending: Pulmonary Disease | Admitting: Pulmonary Disease

## 2016-05-09 DIAGNOSIS — Z87891 Personal history of nicotine dependence: Secondary | ICD-10-CM | POA: Diagnosis not present

## 2016-05-09 DIAGNOSIS — F419 Anxiety disorder, unspecified: Secondary | ICD-10-CM | POA: Diagnosis not present

## 2016-05-09 DIAGNOSIS — Z85828 Personal history of other malignant neoplasm of skin: Secondary | ICD-10-CM | POA: Diagnosis not present

## 2016-05-09 DIAGNOSIS — M8580 Other specified disorders of bone density and structure, unspecified site: Secondary | ICD-10-CM | POA: Diagnosis not present

## 2016-05-09 DIAGNOSIS — E785 Hyperlipidemia, unspecified: Secondary | ICD-10-CM | POA: Diagnosis not present

## 2016-05-09 DIAGNOSIS — J441 Chronic obstructive pulmonary disease with (acute) exacerbation: Secondary | ICD-10-CM | POA: Diagnosis not present

## 2016-05-09 DIAGNOSIS — J449 Chronic obstructive pulmonary disease, unspecified: Secondary | ICD-10-CM

## 2016-05-10 ENCOUNTER — Other Ambulatory Visit: Payer: Self-pay

## 2016-05-10 ENCOUNTER — Telehealth: Payer: Self-pay | Admitting: Family Medicine

## 2016-05-10 ENCOUNTER — Institutional Professional Consult (permissible substitution): Payer: PPO | Admitting: Internal Medicine

## 2016-05-10 ENCOUNTER — Telehealth: Payer: Self-pay

## 2016-05-10 MED ORDER — TIOTROPIUM BROMIDE MONOHYDRATE 1.25 MCG/ACT IN AERS: 2.0000 | INHALATION_SPRAY | Freq: Every day | RESPIRATORY_TRACT | 5 refills | Status: DC

## 2016-05-10 NOTE — Telephone Encounter (Signed)
Pt wanted anoro filled;previously prescribed by Dr Elsworth Soho. Pt will ck with dr Bari Mantis office.

## 2016-05-10 NOTE — Telephone Encounter (Signed)
Pt came in to drop off a letter for medication PA.   Pt needs a refill for HYDROcodone-acetaminophen (NORCO/VICODIN) 5-325 MG tablet and gabapentin (NEURONTIN) 800 MG tablet.  Pharmacy is [place], [place]  Call pt @ [phone]. Thank you!

## 2016-05-10 NOTE — Telephone Encounter (Signed)
PA medication was prescribed by pulmonologist. Letter is on your desk.  Last office visit 05/01/16 Hydrocodone last refill 04/13/16 #120 Gabapentin last refill 01/11/16 #270/1
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
## 2016-05-12 MED ORDER — HYDROCODONE-ACETAMINOPHEN 5-325 MG PO TABS: 1.0000 | ORAL_TABLET | Freq: Four times a day (QID) | ORAL | 0 refills | Status: DC | PRN

## 2016-05-12 MED ORDER — GABAPENTIN 800 MG PO TABS: 800.0000 mg | ORAL_TABLET | Freq: Three times a day (TID) | ORAL | 1 refills | Status: DC

## 2016-05-12 NOTE — Telephone Encounter (Signed)
It looks like anoro ellipta was approved.   rxs printed.  Thanks.

## 2016-05-15 NOTE — Telephone Encounter (Signed)
Left detailed message on voicemail.  

## 2016-05-15 NOTE — Telephone Encounter (Signed)
Pt left v/m requesting rx hydrocodone apap. Per note appears printed hydrocodone apap on 05/12/16.Please advise.

## 2016-05-16 ENCOUNTER — Encounter (HOSPITAL_COMMUNITY)
Admission: RE | Admit: 2016-05-16 | Discharge: 2016-05-16 | Disposition: A | Discharge status: Home / Self Care | Payer: PPO | Source: Ambulatory Visit | Attending: Pulmonary Disease | Admitting: Pulmonary Disease

## 2016-05-16 ENCOUNTER — Other Ambulatory Visit: Payer: Self-pay | Admitting: Family Medicine

## 2016-05-16 VITALS — Wt 125.4 lb

## 2016-05-16 DIAGNOSIS — J449 Chronic obstructive pulmonary disease, unspecified: Secondary | ICD-10-CM

## 2016-05-16 DIAGNOSIS — J441 Chronic obstructive pulmonary disease with (acute) exacerbation: Secondary | ICD-10-CM | POA: Diagnosis not present

## 2016-05-16 MED ORDER — HYDROCODONE-ACETAMINOPHEN 5-325 MG PO TABS: 1.0000 | ORAL_TABLET | Freq: Four times a day (QID) | ORAL | 0 refills | Status: DC | PRN

## 2016-05-16 NOTE — Progress Notes (Signed)
Daily Session Note  Patient Details  Name: Megan Peterson MRN: [id number] Date of Birth: April 11, 1940 Referring Provider:     Pulmonary Rehab Walk Test from 05/09/2016 in [place]  Referring Provider  Dr. Elsworth Soho      Encounter Date: 05/16/2016  Check In:     Session Check In - 05/16/16 1030      Check-In   Location [facility]   Staff Present Rosebud Poles, RN, BSN;Shaylyn Bawa, MS, ACSM RCEP, Exercise Physiologist;Lisa Ysidro Evert, RN;Portia Rollene Rotunda, RN, BSN   Supervising physician immediately available to respond to emergencies [facility] immediately available   Medication changes reported     No   Fall or balance concerns reported    No   Tobacco Cessation No Change   Warm-up and Cool-down Performed as group-led instruction   Resistance Training Performed Yes   VAD Patient? No     Pain Assessment   Currently in Pain? No/denies   Multiple Pain Sites No      Capillary Blood Glucose: No results found for this or any previous visit (from the past 24 hour(s)).      Exercise Prescription Changes - 05/16/16 1200      Response to Exercise   Blood Pressure (Admit) 104/72   Blood Pressure (Exercise) 116/62   Blood Pressure (Exit) 124/76   Heart Rate (Admit) 78 bpm   Heart Rate (Exercise) 93 bpm   Heart Rate (Exit) 95 bpm   Oxygen Saturation (Admit) 95 %   Oxygen Saturation (Exercise) 95 %   Oxygen Saturation (Exit) 96 %   Rating of Perceived Exertion (Exercise) 13   Perceived Dyspnea (Exercise) 2   Duration Progress to 45 minutes of aerobic exercise without signs/symptoms of physical distress   Intensity THRR unchanged     Progression   Progression Continue to progress workloads to maintain intensity without signs/symptoms of physical distress.     Resistance Training   Training Prescription Yes   Weight orange bands   Reps 10-15   Time 10 Minutes     Interval Training   Interval Training No     Oxygen   Oxygen  Continuous   Liters 3     NuStep   Level 2   Minutes 17   METs 2.1     Arm Ergometer   Level 1   Minutes 17     Track   Laps 12   Minutes 17      History  Smoking Status  . Former Smoker  . Quit date: 01/03/1991  Smokeless Tobacco  . Never Used    Goals Met:  Exercise tolerated well No report of cardiac concerns or symptoms Strength training completed today  Goals Unmet:  Not Applicable  Comments: Service time is from 10:30a to 12:15p    Dr. Rush Farmer is Medical Director for Pulmonary Rehab at [facility].

## 2016-05-16 NOTE — Progress Notes (Signed)
rx printed.  Thanks.  

## 2016-05-18 ENCOUNTER — Encounter (HOSPITAL_COMMUNITY)
Admission: RE | Admit: 2016-05-18 | Discharge: 2016-05-18 | Disposition: A | Discharge status: Home / Self Care | Payer: PPO | Source: Ambulatory Visit | Attending: Pulmonary Disease | Admitting: Pulmonary Disease

## 2016-05-18 VITALS — Wt 124.8 lb

## 2016-05-18 DIAGNOSIS — J449 Chronic obstructive pulmonary disease, unspecified: Secondary | ICD-10-CM

## 2016-05-18 DIAGNOSIS — J441 Chronic obstructive pulmonary disease with (acute) exacerbation: Secondary | ICD-10-CM | POA: Diagnosis not present

## 2016-05-18 NOTE — Progress Notes (Signed)
Daily Session Note  Patient Details  Name: Megan Peterson MRN: [id number] Date of Birth: Jan 16, 1940 Referring Provider:     Pulmonary Rehab Walk Test from 05/09/2016 in [place]  Referring Provider  Dr. Elsworth Soho      Encounter Date: 05/18/2016  Check In:     Session Check In - 05/18/16 1058      Check-In   Location [facility]   Staff Present Rosebud Poles, RN, BSN;Molly diVincenzo, MS, ACSM RCEP, Exercise Physiologist;Fredric Slabach Ysidro Evert, RN   Supervising physician immediately available to respond to emergencies [facility] immediately available   Physician(s) Dr. Broadus John   Medication changes reported     No   Fall or balance concerns reported    No   Tobacco Cessation No Change   Warm-up and Cool-down Performed as group-led instruction   Resistance Training Performed Yes   VAD Patient? No     Pain Assessment   Currently in Pain? No/denies   Multiple Pain Sites No      Capillary Blood Glucose: No results found for this or any previous visit (from the past 24 hour(s)).      Exercise Prescription Changes - 05/18/16 1200      Response to Exercise   Blood Pressure (Admit) 108/56   Blood Pressure (Exercise) 140/80   Blood Pressure (Exit) 105/67   Heart Rate (Admit) 79 bpm   Heart Rate (Exercise) 121 bpm   Heart Rate (Exit) 92 bpm   Oxygen Saturation (Admit) 98 %   Oxygen Saturation (Exercise) 91 %   Oxygen Saturation (Exit) 96 %   Rating of Perceived Exertion (Exercise) 13   Perceived Dyspnea (Exercise) 2   Duration Progress to 45 minutes of aerobic exercise without signs/symptoms of physical distress   Intensity THRR unchanged     Progression   Progression Continue to progress workloads to maintain intensity without signs/symptoms of physical distress.     Resistance Training   Training Prescription Yes   Weight orange bands   Reps 10-15   Time 10 Minutes     Interval Training   Interval Training No     Oxygen   Oxygen Continuous   Liters 3     NuStep   Level 2   Minutes 17   METs 2.5     Track   Laps 12   Minutes 17      History  Smoking Status  . Former Smoker  . Quit date: 01/03/1991  Smokeless Tobacco  . Never Used    Goals Met:  Exercise tolerated well No report of cardiac concerns or symptoms Strength training completed today  Goals Unmet:  Not Applicable  Comments: Service time is from 1030 to 1220    Dr. Rush Farmer is Medical Director for Pulmonary Rehab at [facility].

## 2016-05-19 DIAGNOSIS — J449 Chronic obstructive pulmonary disease, unspecified: Secondary | ICD-10-CM | POA: Diagnosis not present

## 2016-05-19 DIAGNOSIS — J439 Emphysema, unspecified: Secondary | ICD-10-CM | POA: Diagnosis not present

## 2016-05-19 DIAGNOSIS — J9601 Acute respiratory failure with hypoxia: Secondary | ICD-10-CM | POA: Diagnosis not present

## 2016-05-23 ENCOUNTER — Encounter (HOSPITAL_COMMUNITY)
Admission: RE | Admit: 2016-05-23 | Discharge: 2016-05-23 | Disposition: A | Discharge status: Home / Self Care | Payer: PPO | Source: Ambulatory Visit | Attending: Pulmonary Disease | Admitting: Pulmonary Disease

## 2016-05-23 VITALS — Wt 126.8 lb

## 2016-05-23 DIAGNOSIS — J441 Chronic obstructive pulmonary disease with (acute) exacerbation: Secondary | ICD-10-CM | POA: Diagnosis not present

## 2016-05-23 DIAGNOSIS — J449 Chronic obstructive pulmonary disease, unspecified: Secondary | ICD-10-CM

## 2016-05-23 NOTE — Progress Notes (Signed)
Daily Session Note  Patient Details  Name: Megan Peterson MRN: [id number] Date of Birth: March 10, 1940 Referring Provider:     Pulmonary Rehab Walk Test from 05/09/2016 in [place]  Referring Provider  Dr. Elsworth Soho      Encounter Date: 05/23/2016  Check In:     Session Check In - 05/23/16 1030      Check-In   Location [facility]   Staff Present Rosebud Poles, RN, BSN;Leniya Breit, MS, ACSM RCEP, Exercise Physiologist;Lisa Ysidro Evert, RN;Portia Rollene Rotunda, RN, BSN   Supervising physician immediately available to respond to emergencies [facility] immediately available   Physician(s) Dr. Broadus John   Medication changes reported     No   Fall or balance concerns reported    No   Tobacco Cessation No Change   Warm-up and Cool-down Performed as group-led instruction   Resistance Training Performed Yes   VAD Patient? No     Pain Assessment   Currently in Pain? No/denies   Multiple Pain Sites No      Capillary Blood Glucose: No results found for this or any previous visit (from the past 24 hour(s)).      Exercise Prescription Changes - 05/23/16 1200      Response to Exercise   Blood Pressure (Admit) 104/60   Blood Pressure (Exercise) 164/80   Blood Pressure (Exit) 108/62   Heart Rate (Admit) 77 bpm   Heart Rate (Exercise) 132 bpm   Heart Rate (Exit) 93 bpm   Oxygen Saturation (Admit) 98 %   Oxygen Saturation (Exercise) 91 %   Oxygen Saturation (Exit) 94 %   Rating of Perceived Exertion (Exercise) 13   Perceived Dyspnea (Exercise) 2   Duration Progress to 45 minutes of aerobic exercise without signs/symptoms of physical distress   Intensity THRR unchanged     Progression   Progression Continue to progress workloads to maintain intensity without signs/symptoms of physical distress.     Resistance Training   Training Prescription Yes   Weight orange bands   Reps 10-15   Time 10 Minutes     Interval Training   Interval  Training No     Oxygen   Oxygen Continuous   Liters 3     NuStep   Level 3   Minutes 17   METs 2.8     Arm Ergometer   Level 1   Minutes 17     Track   Laps 16   Minutes 17      History  Smoking Status  . Former Smoker  . Quit date: 01/03/1991  Smokeless Tobacco  . Never Used    Goals Met:  Exercise tolerated well No report of cardiac concerns or symptoms Strength training completed today  Goals Unmet:  Not Applicable  Comments: Service time is from 10:30a to 12:04p    Dr. Rush Farmer is Medical Director for Pulmonary Rehab at [facility].

## 2016-05-25 ENCOUNTER — Encounter (HOSPITAL_COMMUNITY)
Admission: RE | Admit: 2016-05-25 | Discharge: 2016-05-25 | Disposition: A | Discharge status: Home / Self Care | Payer: PPO | Source: Ambulatory Visit | Attending: Pulmonary Disease | Admitting: Pulmonary Disease

## 2016-05-25 VITALS — Wt 125.9 lb

## 2016-05-25 DIAGNOSIS — J441 Chronic obstructive pulmonary disease with (acute) exacerbation: Secondary | ICD-10-CM | POA: Diagnosis not present

## 2016-05-25 DIAGNOSIS — J449 Chronic obstructive pulmonary disease, unspecified: Secondary | ICD-10-CM

## 2016-05-25 NOTE — Progress Notes (Signed)
Daily Session Note  Patient Details  Name: Megan Peterson MRN: [id number] Date of Birth: 11/17/1940 Referring Provider:     Pulmonary Rehab Walk Test from 05/09/2016 in [place]  Referring Provider  Dr. Elsworth Soho      Encounter Date: 05/25/2016  Check In:     Session Check In - 05/25/16 1030      Check-In   Location [facility]   Staff Present Trish Fountain, RN, BSN;Ramon Dredge, RN, MHA;Eldean Nanna Leonia Reeves, RN, BSN   Supervising physician immediately available to respond to emergencies [facility] immediately available   Physician(s) Dr. Karleen Hampshire   Medication changes reported     No   Fall or balance concerns reported    No   Tobacco Cessation No Change   Warm-up and Cool-down Performed as group-led instruction   Resistance Training Performed Yes   VAD Patient? No     Pain Assessment   Currently in Pain? No/denies   Multiple Pain Sites No      Capillary Blood Glucose: No results found for this or any previous visit (from the past 24 hour(s)).      Exercise Prescription Changes - 05/25/16 1200      Response to Exercise   Blood Pressure (Admit) 118/52   Blood Pressure (Exercise) 122/68   Blood Pressure (Exit) 92/62   Heart Rate (Admit) 69 bpm   Heart Rate (Exercise) 91 bpm   Heart Rate (Exit) 62 bpm   Oxygen Saturation (Admit) 97 %   Oxygen Saturation (Exercise) 95 %   Oxygen Saturation (Exit) 94 %   Rating of Perceived Exertion (Exercise) 11   Perceived Dyspnea (Exercise) 1   Duration Progress to 45 minutes of aerobic exercise without signs/symptoms of physical distress   Intensity THRR unchanged     Progression   Progression Continue to progress workloads to maintain intensity without signs/symptoms of physical distress.     Resistance Training   Training Prescription Yes   Weight orange bands   Reps 10-15   Time 10 Minutes     Interval Training   Interval Training No     Oxygen   Oxygen Continuous   Liters 3     Arm Ergometer   Level 1   Minutes 34      History  Smoking Status  . Former Smoker  . Quit date: 01/03/1991  Smokeless Tobacco  . Never Used    Goals Met:  Exercise tolerated well Strength training completed today  Goals Unmet:  Not Applicable  Comments: Service time is from 1030 to 1215    Dr. Rush Farmer is Medical Director for Pulmonary Rehab at [facility].

## 2016-05-26 ENCOUNTER — Encounter: Payer: Self-pay | Admitting: Gastroenterology

## 2016-05-30 ENCOUNTER — Encounter (HOSPITAL_COMMUNITY)
Admission: RE | Admit: 2016-05-30 | Discharge: 2016-05-30 | Disposition: A | Discharge status: Home / Self Care | Payer: PPO | Source: Ambulatory Visit | Attending: Pulmonary Disease | Admitting: Pulmonary Disease

## 2016-05-30 VITALS — Wt 127.2 lb

## 2016-05-30 DIAGNOSIS — J449 Chronic obstructive pulmonary disease, unspecified: Secondary | ICD-10-CM

## 2016-05-30 DIAGNOSIS — J441 Chronic obstructive pulmonary disease with (acute) exacerbation: Secondary | ICD-10-CM | POA: Diagnosis not present

## 2016-05-30 NOTE — Progress Notes (Signed)
Daily Session Note  Patient Details  Name: Megan Peterson MRN: [id number] Date of Birth: 08/20/1940 Referring Provider:     Pulmonary Rehab Walk Test from 05/09/2016 in [place]  Referring Provider  Dr. Elsworth Soho      Encounter Date: 05/30/2016  Check In:     Session Check In - 05/30/16 0950      Check-In   Location [facility]   Staff Present Rosebud Poles, RN, Luisa Hart, RN, BSN;Molly diVincenzo, MS, ACSM RCEP, Exercise Physiologist   Supervising physician immediately available to respond to emergencies [facility] immediately available   Physician(s) Dr. Karleen Hampshire   Medication changes reported     No   Fall or balance concerns reported    No   Tobacco Cessation No Change   Warm-up and Cool-down Performed as group-led instruction   Resistance Training Performed Yes   VAD Patient? No     Pain Assessment   Currently in Pain? No/denies   Multiple Pain Sites No      Capillary Blood Glucose: No results found for this or any previous visit (from the past 24 hour(s)).      Exercise Prescription Changes - 05/30/16 1200      Response to Exercise   Blood Pressure (Admit) 104/64   Blood Pressure (Exercise) 142/66   Blood Pressure (Exit) 104/60   Heart Rate (Admit) 75 bpm   Heart Rate (Exercise) 153 bpm  while on track , instructed to take rest breaks on track   Heart Rate (Exit) 86 bpm   Oxygen Saturation (Admit) 99 %   Oxygen Saturation (Exercise) 86 %  while on traack, oxygen up to 3 liters on track   Oxygen Saturation (Exit) 96 %   Rating of Perceived Exertion (Exercise) 13   Perceived Dyspnea (Exercise) 1   Duration Progress to 45 minutes of aerobic exercise without signs/symptoms of physical distress   Intensity THRR unchanged     Progression   Progression Continue to progress workloads to maintain intensity without signs/symptoms of physical distress.     Resistance Training   Training Prescription Yes   Weight orange bands   Reps 10-15   Time 10 Minutes     Interval Training   Interval Training No     Oxygen   Oxygen Continuous   Liters 2-3     NuStep   Level 4   Minutes 17     Arm Ergometer   Level 2   Minutes 17     Track   Laps 12   Minutes 17      History  Smoking Status  . Former Smoker  . Quit date: 01/03/1991  Smokeless Tobacco  . Never Used    Goals Met:  Exercise tolerated well Strength training completed today  Goals Unmet:  Not Applicable  Comments: Service time is from 1030 to 1205    Dr. Rush Farmer is Medical Director for Pulmonary Rehab at [facility].

## 2016-06-01 ENCOUNTER — Encounter (HOSPITAL_COMMUNITY)
Admission: RE | Admit: 2016-06-01 | Discharge: 2016-06-01 | Disposition: A | Discharge status: Home / Self Care | Payer: PPO | Source: Ambulatory Visit | Attending: Pulmonary Disease | Admitting: Pulmonary Disease

## 2016-06-01 VITALS — Wt 128.1 lb

## 2016-06-01 DIAGNOSIS — J449 Chronic obstructive pulmonary disease, unspecified: Secondary | ICD-10-CM

## 2016-06-01 DIAGNOSIS — J441 Chronic obstructive pulmonary disease with (acute) exacerbation: Secondary | ICD-10-CM | POA: Diagnosis not present

## 2016-06-01 NOTE — Progress Notes (Signed)
Daily Session Note  Patient Details  Name: Megan Peterson MRN: [id number] Date of Birth: [birth year] Referring Provider:     Pulmonary Rehab Walk Test from 05/09/2016 in [place]  Referring Provider  Dr. Elsworth Soho      Encounter Date: 06/01/2016  Check In:     Session Check In - 06/01/16 1017      Check-In   Location [facility]   Staff Present Su Hilt, MS, ACSM RCEP, Exercise Physiologist;Portia Rollene Rotunda, RN, BSN   Supervising physician immediately available to respond to emergencies [facility] immediately available   Physician(s) Dr. Broadus John   Medication changes reported     No   Fall or balance concerns reported    No   Tobacco Cessation No Change   Warm-up and Cool-down Performed as group-led instruction   Resistance Training Performed Yes   VAD Patient? No     Pain Assessment   Currently in Pain? No/denies   Multiple Pain Sites No      Capillary Blood Glucose: No results found for this or any previous visit (from the past 24 hour(s)).      Exercise Prescription Changes - 06/01/16 1200      Response to Exercise   Blood Pressure (Admit) 124/54   Blood Pressure (Exercise) 132/66   Blood Pressure (Exit) 110/64   Heart Rate (Admit) 71 bpm   Heart Rate (Exercise) 109 bpm   Heart Rate (Exit) 83 bpm   Oxygen Saturation (Admit) 95 %   Oxygen Saturation (Exercise) 94 %   Oxygen Saturation (Exit) 93 %   Rating of Perceived Exertion (Exercise) 13   Perceived Dyspnea (Exercise) 1   Duration Progress to 45 minutes of aerobic exercise without signs/symptoms of physical distress   Intensity THRR unchanged     Progression   Progression Continue to progress workloads to maintain intensity without signs/symptoms of physical distress.     Resistance Training   Training Prescription Yes   Weight orange bands   Reps 10-15   Time 10 Minutes     Interval Training   Interval Training No     Oxygen   Oxygen  Continuous   Liters 2-3     NuStep   Level 4   Minutes 17   METs 2.4     Track   Laps 12   Minutes 17      History  Smoking Status  . Former Smoker  . Quit date: 01/03/1991  Smokeless Tobacco  . Never Used    Goals Met:  Exercise tolerated well No report of cardiac concerns or symptoms Strength training completed today  Goals Unmet:  Not Applicable  Comments: Service time is from 10:30a to 12:20p    Dr. Rush Farmer is Medical Director for Pulmonary Rehab at [facility].

## 2016-06-06 ENCOUNTER — Encounter (HOSPITAL_COMMUNITY)
Admission: RE | Admit: 2016-06-06 | Discharge: 2016-06-06 | Disposition: A | Discharge status: Home / Self Care | Payer: PPO | Source: Ambulatory Visit | Attending: Pulmonary Disease | Admitting: Pulmonary Disease

## 2016-06-06 VITALS — Wt 127.6 lb

## 2016-06-06 DIAGNOSIS — J441 Chronic obstructive pulmonary disease with (acute) exacerbation: Secondary | ICD-10-CM | POA: Insufficient documentation

## 2016-06-06 DIAGNOSIS — Z85828 Personal history of other malignant neoplasm of skin: Secondary | ICD-10-CM | POA: Insufficient documentation

## 2016-06-06 DIAGNOSIS — M8580 Other specified disorders of bone density and structure, unspecified site: Secondary | ICD-10-CM | POA: Insufficient documentation

## 2016-06-06 DIAGNOSIS — J449 Chronic obstructive pulmonary disease, unspecified: Secondary | ICD-10-CM

## 2016-06-06 DIAGNOSIS — Z87891 Personal history of nicotine dependence: Secondary | ICD-10-CM | POA: Diagnosis not present

## 2016-06-06 DIAGNOSIS — E785 Hyperlipidemia, unspecified: Secondary | ICD-10-CM | POA: Insufficient documentation

## 2016-06-06 DIAGNOSIS — F419 Anxiety disorder, unspecified: Secondary | ICD-10-CM | POA: Insufficient documentation

## 2016-06-06 NOTE — Progress Notes (Signed)
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 breathing and self-pacing activity.   Expected Outcomes Short Term: Participant will be able to demonstrate and use breathing techniques as needed throughout daily activities.   Increase knowledge of respiratory medications and ability to use respiratory devices properly  Yes   Intervention Provide education and demonstration as needed of appropriate use of medications, inhalers, and oxygen therapy.   Expected Outcomes Short Term: Achieves understanding of medications use. Understands that oxygen is a medication prescribed by physician. Demonstrates appropriate use of inhaler and oxygen therapy.      Core Components/Risk  Factors/Patient Goals Review:      Goals and Risk Factor Review    Row Name 06/06/16 1631             Core Components/Risk Factors/Patient Goals Review   Personal Goals Review Develop more efficient breathing techniques such as  purse lipped breathing and diaphragmatic breathing and practicing self-pacing with activity.;Improve shortness of breath with ADL's;Increase knowledge of respiratory medications and ability to use respiratory devices properly.       Review see comment section on ITP       Expected Outcomes see admission expected outcomes          Core Components/Risk Factors/Patient Goals at Discharge (Final Review):      Goals and Risk Factor Review - 06/06/16 1631      Core Components/Risk Factors/Patient Goals Review   Personal Goals Review Develop more efficient breathing techniques such as purse lipped breathing and diaphragmatic breathing and practicing self-pacing with activity.;Improve shortness of breath with ADL's;Increase knowledge of respiratory medications and ability to use respiratory devices properly.   Review see comment section on ITP   Expected Outcomes see admission expected outcomes      ITP Comments:   Comments: ITP REVIEW Pt is making expected progress toward pulmonary rehab goals after completing 7 sessions. She states she is beginning to see an improvement in her shortness of breath with activity. She is becoming more confident with wearing her oxygen as prescribed. She is tolerating progression of her workloads and is able to walk 12 laps in 15 minutes. Recommend continued exercise, life style modification, education, and utilization of breathing techniques to increase stamina and strength and decrease shortness of breath with exertion.

## 2016-06-06 NOTE — Progress Notes (Signed)
Daily Session Note  Patient Details  Name: Megan Peterson MRN: [id number] Date of Birth: 25-Mar-1940 Referring Provider:     Pulmonary Rehab Walk Test from 05/09/2016 in [place]  Referring Provider  Dr. Elsworth Soho      Encounter Date: 06/06/2016  Check In:     Session Check In - 06/06/16 1034      Check-In   Location [facility]   Staff Present Rosebud Poles, RN, BSN;Laranda Burkemper, MS, ACSM RCEP, Exercise Physiologist;Lisa Ysidro Evert, RN;Portia Rollene Rotunda, RN, BSN   Supervising physician immediately available to respond to emergencies [facility] immediately available   Physician(s) Dr.Joseph   Medication changes reported     No   Fall or balance concerns reported    No   Tobacco Cessation No Change   Warm-up and Cool-down Performed as group-led instruction   Resistance Training Performed Yes   VAD Patient? No     Pain Assessment   Currently in Pain? No/denies   Multiple Pain Sites No      Capillary Blood Glucose: No results found for this or any previous visit (from the past 24 hour(s)).      Exercise Prescription Changes - 06/06/16 1200      Response to Exercise   Blood Pressure (Admit) 142/70   Blood Pressure (Exercise) 136/72   Blood Pressure (Exit) 123/64   Heart Rate (Admit) 70 bpm   Heart Rate (Exercise) 105 bpm   Heart Rate (Exit) 97 bpm   Oxygen Saturation (Admit) 97 %   Oxygen Saturation (Exercise) 93 %   Oxygen Saturation (Exit) 90 %   Rating of Perceived Exertion (Exercise) 13   Perceived Dyspnea (Exercise) 1   Duration Progress to 45 minutes of aerobic exercise without signs/symptoms of physical distress   Intensity THRR unchanged     Progression   Progression Continue to progress workloads to maintain intensity without signs/symptoms of physical distress.     Resistance Training   Training Prescription Yes   Weight orange bands   Reps 10-15   Time 10 Minutes     Interval Training   Interval Training  No     Oxygen   Oxygen Continuous   Liters 2-3     NuStep   Level 4   Minutes 17   METs 2.4     Arm Ergometer   Level 1   Minutes 17     Track   Laps 12   Minutes 17      History  Smoking Status  . Former Smoker  . Quit date: 01/03/1991  Smokeless Tobacco  . Never Used    Goals Met:  Exercise tolerated well No report of cardiac concerns or symptoms Strength training completed today  Goals Unmet:  Not Applicable  Comments: Service time is from 10:30a to 12:10p    Dr. Rush Farmer is Medical Director for Pulmonary Rehab at [facility].

## 2016-06-08 ENCOUNTER — Encounter (HOSPITAL_COMMUNITY)
Admission: RE | Admit: 2016-06-08 | Discharge: 2016-06-08 | Disposition: A | Discharge status: Home / Self Care | Payer: PPO | Source: Ambulatory Visit | Attending: Pulmonary Disease | Admitting: Pulmonary Disease

## 2016-06-08 VITALS — Wt 127.4 lb

## 2016-06-08 DIAGNOSIS — J449 Chronic obstructive pulmonary disease, unspecified: Secondary | ICD-10-CM

## 2016-06-08 DIAGNOSIS — J441 Chronic obstructive pulmonary disease with (acute) exacerbation: Secondary | ICD-10-CM | POA: Diagnosis not present

## 2016-06-08 NOTE — Progress Notes (Signed)
Daily Session Note  Patient Details  Name: Megan Peterson MRN: [id number] Date of Birth: 1940-04-03 Referring Provider:     Pulmonary Rehab Walk Test from 05/09/2016 in [place]  Referring Provider  Dr. Elsworth Soho      Encounter Date: 06/08/2016  Check In:     Session Check In - 06/08/16 1026      Check-In   [facility]   Staff Present Trish Fountain, RN, Maxcine Ham, RN, BSN;Molly diVincenzo, MS, ACSM RCEP, Exercise Physiologist;Lisa Ysidro Evert, RN   Supervising physician immediately available to respond to emergencies [facility] immediately available   Physician(s) Dr. Posey Pronto   Medication changes reported     No   Fall or balance concerns reported    No   Tobacco Cessation No Change   Warm-up and Cool-down Performed as group-led instruction   Resistance Training Performed Yes   VAD Patient? No     Pain Assessment   Currently in Pain? No/denies   Multiple Pain Sites No      Capillary Blood Glucose: No results found for this or any previous visit (from the past 24 hour(s)).      Exercise Prescription Changes - 06/08/16 1200      Response to Exercise   Blood Pressure (Admit) 100/60   Blood Pressure (Exercise) 112/60   Blood Pressure (Exit) 102/62   Heart Rate (Admit) 77 bpm   Heart Rate (Exercise) 126 bpm   Heart Rate (Exit) 87 bpm   Oxygen Saturation (Admit) 99 %   Oxygen Saturation (Exercise) 94 %   Oxygen Saturation (Exit) 96 %   Rating of Perceived Exertion (Exercise) 13   Perceived Dyspnea (Exercise) 1   Duration Progress to 45 minutes of aerobic exercise without signs/symptoms of physical distress   Intensity THRR unchanged     Progression   Progression Continue to progress workloads to maintain intensity without signs/symptoms of physical distress.     Resistance Training   Training Prescription Yes   Weight orange bands   Reps 10-15   Time 10 Minutes     Interval Training   Interval Training  No     Oxygen   Oxygen Continuous     NuStep   Level 4   Minutes 17   METs 2.9     Arm Ergometer   Level 2   Minutes 17     Track   Laps 14   Minutes 17      History  Smoking Status  . Former Smoker  . Quit date: 01/03/1991  Smokeless Tobacco  . Never Used    Goals Met:  Exercise tolerated well Strength training completed today  Goals Unmet:  Not Applicable  Comments: Service time is from 1030 to 1200    Dr. Rush Farmer is Medical Director for Pulmonary Rehab at [facility].

## 2016-06-13 ENCOUNTER — Encounter (HOSPITAL_COMMUNITY)
Admission: RE | Admit: 2016-06-13 | Discharge: 2016-06-13 | Disposition: A | Discharge status: Home / Self Care | Payer: PPO | Source: Ambulatory Visit | Attending: Pulmonary Disease | Admitting: Pulmonary Disease

## 2016-06-13 VITALS — Wt 126.8 lb

## 2016-06-13 DIAGNOSIS — J441 Chronic obstructive pulmonary disease with (acute) exacerbation: Secondary | ICD-10-CM | POA: Diagnosis not present

## 2016-06-13 DIAGNOSIS — J449 Chronic obstructive pulmonary disease, unspecified: Secondary | ICD-10-CM

## 2016-06-13 NOTE — Progress Notes (Signed)
I have reviewed a Home Exercise Prescription with Megan Peterson . [name] is not currently exercising at home.  The patient was advised to walk 2-3 days a week for 30 minutes.  [name] and I discussed how to progress their exercise prescription.  The patient stated that their goals were to be able to feel good enough to play with her grandchildren again.  The patient stated that they understand the exercise prescription.  We reviewed exercise guidelines, target heart rate during exercise, oxygen use, weather, home pulse oximeter, endpoints for exercise, and goals.  Patient is encouraged to come to me with any questions. I will continue to follow up with the patient to assist them with progression and safety.

## 2016-06-13 NOTE — Progress Notes (Signed)
Daily Session Note  Patient Details  Name: Megan Peterson MRN: [id number] Date of Birth: 06-Jul-1940 Referring Provider:     Pulmonary Rehab Walk Test from 05/09/2016 in [place]  Referring Provider  Dr. Elsworth Soho      Encounter Date: 06/13/2016  Check In:     Session Check In - 06/13/16 1015      Check-In   [facility]   Staff Present Trish Fountain, RN, Maxcine Ham, RN, BSN;Torrell Krutz, MS, ACSM RCEP, Exercise Physiologist   Supervising physician immediately available to respond to emergencies [facility] immediately available   Physician(s) Dr. Posey Pronto   Medication changes reported     No   Fall or balance concerns reported    No   Tobacco Cessation No Change   Warm-up and Cool-down Performed as group-led instruction   Resistance Training Performed Yes   VAD Patient? No     Pain Assessment   Currently in Pain? No/denies   Multiple Pain Sites No      Capillary Blood Glucose: No results found for this or any previous visit (from the past 24 hour(s)).      Exercise Prescription Changes - 06/13/16 1200      Response to Exercise   Blood Pressure (Admit) 116/58   Blood Pressure (Exercise) 140/70   Blood Pressure (Exit) 104/64   Heart Rate (Admit) 65 bpm   Heart Rate (Exercise) 104 bpm   Heart Rate (Exit) 83 bpm   Oxygen Saturation (Admit) 97 %   Oxygen Saturation (Exercise) 90 %   Oxygen Saturation (Exit) 95 %   Rating of Perceived Exertion (Exercise) 12   Perceived Dyspnea (Exercise) 3   Duration Progress to 45 minutes of aerobic exercise without signs/symptoms of physical distress   Intensity THRR unchanged     Progression   Progression Continue to progress workloads to maintain intensity without signs/symptoms of physical distress.     Resistance Training   Training Prescription Yes   Weight orange bands   Reps 10-15   Time 10 Minutes     Interval Training   Interval Training No     Oxygen   Oxygen Continuous     NuStep   Level 4   Minutes 17   METs 2.7     Arm Ergometer   Level 2   Minutes 17     Track   Laps 15   Minutes 17      History  Smoking Status  . Former Smoker  . Quit date: 01/03/1991  Smokeless Tobacco  . Never Used    Goals Met:  Exercise tolerated well No report of cardiac concerns or symptoms Strength training completed today  Goals Unmet:  Not Applicable  Comments: Service time is from 10:30a to 12:00p    Dr. Rush Farmer is Medical Director for Pulmonary Rehab at [facility].

## 2016-06-14 ENCOUNTER — Other Ambulatory Visit: Payer: Self-pay

## 2016-06-14 NOTE — Telephone Encounter (Signed)
Pt left v/m requesting rx hydrocodone apap(last printed # 120 on 05/16/16) pt last seen 05/01/16.. Call when ready for pick up. Pt is going out of town early morning on 05/16/16; also pt wants to know if she is to continue taking generic toprol (Metoprolol) which was given to pt at hospital pt request cb.Marland Kitchen

## 2016-06-15 ENCOUNTER — Other Ambulatory Visit: Payer: Self-pay | Admitting: *Deleted

## 2016-06-15 ENCOUNTER — Encounter (HOSPITAL_COMMUNITY)
Admission: RE | Admit: 2016-06-15 | Discharge: 2016-06-15 | Disposition: A | Discharge status: Home / Self Care | Payer: PPO | Source: Ambulatory Visit | Attending: Pulmonary Disease | Admitting: Pulmonary Disease

## 2016-06-15 VITALS — Wt 127.2 lb

## 2016-06-15 DIAGNOSIS — J441 Chronic obstructive pulmonary disease with (acute) exacerbation: Secondary | ICD-10-CM | POA: Diagnosis not present

## 2016-06-15 DIAGNOSIS — J449 Chronic obstructive pulmonary disease, unspecified: Secondary | ICD-10-CM

## 2016-06-15 MED ORDER — METOPROLOL TARTRATE 25 MG PO TABS: 12.5000 mg | ORAL_TABLET | Freq: Two times a day (BID) | ORAL | 3 refills | Status: DC

## 2016-06-15 MED ORDER — HYDROCODONE-ACETAMINOPHEN 5-325 MG PO TABS: 1.0000 | ORAL_TABLET | Freq: Four times a day (QID) | ORAL | 0 refills | Status: DC | PRN

## 2016-06-15 NOTE — Progress Notes (Signed)
Daily Session Note  Patient Details  Name: Megan Peterson MRN: [id number] Date of Birth: 06/02/1940 Referring Provider:     Pulmonary Rehab Walk Test from 05/09/2016 in [facility]  Referring Provider  Dr. Alva      Encounter Date: 06/15/2016  Check In:     Session Check In - 06/15/16 1007      Check-In   [facility]   Staff Present Portia Payne, RN, BSN; , MS, ACSM RCEP, Exercise Physiologist   Supervising physician immediately available to respond to emergencies [facility] immediately available   Physician(s) Dr. Ghimire   Medication changes reported     No   Fall or balance concerns reported    No   Tobacco Cessation No Change   Warm-up and Cool-down Performed as group-led instruction   Resistance Training Performed Yes   VAD Patient? No     Pain Assessment   Currently in Pain? No/denies   Multiple Pain Sites No      Capillary Blood Glucose: No results found for this or any previous visit (from the past 24 hour(s)).      Exercise Prescription Changes - 06/15/16 1200      Response to Exercise   Blood Pressure (Admit) 116/62   Blood Pressure (Exercise) 150/76   Blood Pressure (Exit) 112/76   Heart Rate (Admit) 67 bpm   Heart Rate (Exercise) 93 bpm   Heart Rate (Exit) 73 bpm   Oxygen Saturation (Admit) 98 %   Oxygen Saturation (Exercise) 94 %   Oxygen Saturation (Exit) 95 %   Rating of Perceived Exertion (Exercise) 13   Perceived Dyspnea (Exercise) 2   Duration Progress to 45 minutes of aerobic exercise without signs/symptoms of physical distress   Intensity THRR unchanged     Progression   Progression Continue to progress workloads to maintain intensity without signs/symptoms of physical distress.     Resistance Training   Training Prescription Yes   Weight orange bands   Reps 10-15   Time 10 Minutes     Interval Training   Interval Training No     Oxygen   Oxygen  Continuous   Liters 2-3     NuStep   Level 4   Minutes 17   METs 2.7     Arm Ergometer   Level 2   Minutes 17      History  Smoking Status  . Former Smoker  . Quit date: 01/03/1991  Smokeless Tobacco  . Never Used    Goals Met:  Exercise tolerated well No report of cardiac concerns or symptoms Strength training completed today  Goals Unmet:  Not Applicable  Comments: Service time is from 10:30a to 12:28p    Dr. Wesam G. Yacoub is Medical Director for Pulmonary Rehab at [facility]. 

## 2016-06-15 NOTE — Telephone Encounter (Signed)
Would continue metoprolol.  Sent.   vicodin rx printed.  Thanks.

## 2016-06-15 NOTE — Telephone Encounter (Signed)
Patient advised.  Rx left at front desk for pick up. 

## 2016-06-16 ENCOUNTER — Other Ambulatory Visit: Payer: Self-pay | Admitting: Pulmonary Disease

## 2016-06-16 DIAGNOSIS — R0600 Dyspnea, unspecified: Secondary | ICD-10-CM

## 2016-06-19 ENCOUNTER — Ambulatory Visit (INDEPENDENT_AMBULATORY_CARE_PROVIDER_SITE_OTHER): Payer: PPO | Admitting: Pulmonary Disease

## 2016-06-19 DIAGNOSIS — J9601 Acute respiratory failure with hypoxia: Secondary | ICD-10-CM | POA: Diagnosis not present

## 2016-06-19 DIAGNOSIS — J439 Emphysema, unspecified: Secondary | ICD-10-CM | POA: Diagnosis not present

## 2016-06-19 DIAGNOSIS — R0600 Dyspnea, unspecified: Secondary | ICD-10-CM

## 2016-06-19 DIAGNOSIS — J449 Chronic obstructive pulmonary disease, unspecified: Secondary | ICD-10-CM | POA: Diagnosis not present

## 2016-06-19 LAB — PULMONARY FUNCTION TEST
DL/VA % PRED: 72 %
DL/VA: 2.82 ml/min/mmHg/L
DLCO COR % PRED: 55 %
DLCO COR: 9.04 ml/min/mmHg
DLCO UNC % PRED: 56 %
DLCO UNC: 9.15 ml/min/mmHg
FEF 25-75 POST: 0.4 L/s
FEF 25-75 PRE: 0.28 L/s
FEF2575-%Change-Post: 42 %
FEF2575-%PRED-POST: 30 %
FEF2575-%PRED-PRE: 21 %
FEV1-%Change-Post: 13 %
FEV1-%PRED-POST: 55 %
FEV1-%Pred-Pre: 49 %
FEV1-PRE: 0.77 L
FEV1-Post: 0.87 L
FEV1FVC-%CHANGE-POST: 1 %
FEV1FVC-%PRED-PRE: 67 %
FEV6-%CHANGE-POST: 11 %
FEV6-%PRED-POST: 81 %
FEV6-%Pred-Pre: 73 %
FEV6-PRE: 1.46 L
FEV6-Post: 1.62 L
FEV6FVC-%CHANGE-POST: 0 %
FEV6FVC-%PRED-PRE: 103 %
FEV6FVC-%Pred-Post: 102 %
FVC-%Change-Post: 11 %
FVC-%Pred-Post: 80 %
FVC-%Pred-Pre: 72 %
FVC-Post: 1.69 L
FVC-Pre: 1.52 L
POST FEV1/FVC RATIO: 52 %
PRE FEV1/FVC RATIO: 51 %
Post FEV6/FVC ratio: 96 %
Pre FEV6/FVC Ratio: 97 %
RV % pred: 150 %
RV: 2.99 L
TLC % pred: 109 %
TLC: 4.56 L

## 2016-06-19 NOTE — Progress Notes (Signed)
PFT done today. 

## 2016-06-20 ENCOUNTER — Encounter (HOSPITAL_COMMUNITY)
Admission: RE | Admit: 2016-06-20 | Discharge: 2016-06-20 | Disposition: A | Discharge status: Home / Self Care | Payer: PPO | Source: Ambulatory Visit | Attending: Pulmonary Disease | Admitting: Pulmonary Disease

## 2016-06-20 ENCOUNTER — Ambulatory Visit: Payer: PPO | Admitting: Pulmonary Disease

## 2016-06-20 VITALS — Wt 127.9 lb

## 2016-06-20 DIAGNOSIS — J441 Chronic obstructive pulmonary disease with (acute) exacerbation: Secondary | ICD-10-CM | POA: Diagnosis not present

## 2016-06-20 DIAGNOSIS — J449 Chronic obstructive pulmonary disease, unspecified: Secondary | ICD-10-CM

## 2016-06-20 NOTE — Progress Notes (Signed)
Daily Session Note  Patient Details  Name: Megan Peterson MRN: [id number] Date of Birth: 01/04/1940 Referring Provider:     Pulmonary Rehab Walk Test from 05/09/2016 in [place]  Referring Provider  Dr. Elsworth Soho      Encounter Date: 06/20/2016  Check In:     Session Check In - 06/20/16 1009      Check-In   [facility]   Staff Present Trish Fountain, RN, Maxcine Ham, RN, BSN;Joleah Kosak, MS, ACSM RCEP, Exercise Physiologist   Supervising physician immediately available to respond to emergencies [facility] immediately available   Physician(s) Dr. Allyson Sabal   Medication changes reported     No   Fall or balance concerns reported    No   Tobacco Cessation No Change   Warm-up and Cool-down Performed as group-led instruction   Resistance Training Performed Yes   VAD Patient? No     Pain Assessment   Currently in Pain? No/denies   Multiple Pain Sites No      Capillary Blood Glucose: No results found for this or any previous visit (from the past 24 hour(s)).      Exercise Prescription Changes - 06/20/16 1200      Response to Exercise   Blood Pressure (Admit) 110/60   Blood Pressure (Exercise) 116/60   Blood Pressure (Exit) 108/60   Heart Rate (Admit) 61 bpm   Heart Rate (Exercise) 107 bpm   Heart Rate (Exit) 79 bpm   Oxygen Saturation (Admit) 99 %   Oxygen Saturation (Exercise) 93 %   Oxygen Saturation (Exit) 96 %   Rating of Perceived Exertion (Exercise) 13   Perceived Dyspnea (Exercise) 2   Duration Progress to 45 minutes of aerobic exercise without signs/symptoms of physical distress   Intensity THRR unchanged     Progression   Progression Continue to progress workloads to maintain intensity without signs/symptoms of physical distress.     Resistance Training   Training Prescription Yes   Weight orange bands   Reps 10-15   Time 10 Minutes     Interval Training   Interval Training No     Oxygen   Oxygen Continuous   Liters 2-3     Treadmill   MPH 1.8   Grade 0   Minutes 17     NuStep   Level 4   Minutes 17   METs 2.4     Arm Ergometer   Level 2   Minutes 17      History  Smoking Status  . Former Smoker  . Quit date: 01/03/1991  Smokeless Tobacco  . Never Used    Goals Met:  Exercise tolerated well No report of cardiac concerns or symptoms Strength training completed today  Goals Unmet:  Not Applicable  Comments: Service time is from 10:30a to 12:00p    Dr. Rush Farmer is Medical Director for Pulmonary Rehab at [facility].

## 2016-06-22 ENCOUNTER — Encounter (HOSPITAL_COMMUNITY)
Admission: RE | Admit: 2016-06-22 | Discharge: 2016-06-22 | Disposition: A | Discharge status: Home / Self Care | Payer: PPO | Source: Ambulatory Visit | Attending: Pulmonary Disease | Admitting: Pulmonary Disease

## 2016-06-22 VITALS — Wt 127.9 lb

## 2016-06-22 DIAGNOSIS — J449 Chronic obstructive pulmonary disease, unspecified: Secondary | ICD-10-CM

## 2016-06-22 DIAGNOSIS — J441 Chronic obstructive pulmonary disease with (acute) exacerbation: Secondary | ICD-10-CM | POA: Diagnosis not present

## 2016-06-22 NOTE — Progress Notes (Signed)
Daily Session Note  Patient Details  Name: Megan Peterson MRN: [id number] Date of Birth: 10-13-1940 Referring Provider:     Pulmonary Rehab Walk Test from 05/09/2016 in [place]  Referring Provider  Dr. Elsworth Soho      Encounter Date: 06/22/2016  Check In:     Session Check In - 06/22/16 1103      Check-In   [facility]   Staff Present Su Hilt, MS, ACSM RCEP, Exercise Physiologist;Aritha Huckeba Leonia Reeves, RN, Luisa Hart, RN, BSN   Supervising physician immediately available to respond to emergencies [facility] immediately available   Physician(s) Dr. Allyson Sabal   Medication changes reported     No   Fall or balance concerns reported    No   Tobacco Cessation No Change   Warm-up and Cool-down Performed as group-led instruction   Resistance Training Performed Yes   VAD Patient? No     Pain Assessment   Currently in Pain? No/denies   Multiple Pain Sites No      Capillary Blood Glucose: No results found for this or any previous visit (from the past 24 hour(s)).      Exercise Prescription Changes - 06/22/16 1200      Response to Exercise   Blood Pressure (Admit) 134/60   Blood Pressure (Exercise) 124/70   Blood Pressure (Exit) 124/70   Heart Rate (Admit) 76 bpm   Heart Rate (Exercise) 79 bpm   Heart Rate (Exit) 69 bpm   Oxygen Saturation (Admit) 100 %   Oxygen Saturation (Exercise) 98 %   Oxygen Saturation (Exit) 98 %   Rating of Perceived Exertion (Exercise) 13   Perceived Dyspnea (Exercise) 0   Duration Progress to 45 minutes of aerobic exercise without signs/symptoms of physical distress   Intensity THRR unchanged     Progression   Progression Continue to progress workloads to maintain intensity without signs/symptoms of physical distress.     Resistance Training   Training Prescription Yes   Weight orange bands   Reps 10-15   Time 10 Minutes     Interval Training   Interval Training No     Oxygen   Oxygen Continuous   Liters 2-3     Treadmill   MPH 1.8   Grade 0   Minutes 17     NuStep   Level 4   Minutes 17   METs 2.7      History  Smoking Status  . Former Smoker  . Quit date: 01/03/1991  Smokeless Tobacco  . Never Used    Goals Met:  Exercise tolerated well Strength training completed today  Goals Unmet:  Not Applicable  Comments: Service time is from 1030 to 1220    Dr. Rush Farmer is Medical Director for Pulmonary Rehab at [facility].

## 2016-06-26 ENCOUNTER — Ambulatory Visit: Payer: PPO | Admitting: Acute Care

## 2016-06-27 ENCOUNTER — Encounter (HOSPITAL_COMMUNITY)
Admission: RE | Admit: 2016-06-27 | Discharge: 2016-06-27 | Disposition: A | Discharge status: Home / Self Care | Payer: PPO | Source: Ambulatory Visit | Attending: Pulmonary Disease | Admitting: Pulmonary Disease

## 2016-06-27 VITALS — Wt 126.8 lb

## 2016-06-27 DIAGNOSIS — J449 Chronic obstructive pulmonary disease, unspecified: Secondary | ICD-10-CM

## 2016-06-27 DIAGNOSIS — J441 Chronic obstructive pulmonary disease with (acute) exacerbation: Secondary | ICD-10-CM | POA: Diagnosis not present

## 2016-06-27 NOTE — Progress Notes (Signed)
Daily Session Note  Patient Details  Name: Megan Peterson MRN: [id number] Date of Birth: 1940/07/16 Referring Provider:     Pulmonary Rehab Walk Test from 05/09/2016 in [place]  Referring Provider  Dr. Elsworth Soho      Encounter Date: 06/27/2016  Check In:     Session Check In - 06/27/16 1045      Check-In   [facility]   Staff Present Rosebud Poles, RN, BSN;Molly diVincenzo, MS, ACSM RCEP, Exercise Physiologist;Mirca Yale Ysidro Evert, RN   Supervising physician immediately available to respond to emergencies [facility] immediately available   Physician(s) Dr. Allyson Sabal   Medication changes reported     No   Fall or balance concerns reported    No   Tobacco Cessation No Change   Warm-up and Cool-down Performed as group-led instruction   Resistance Training Performed Yes   VAD Patient? No     Pain Assessment   Currently in Pain? No/denies   Multiple Pain Sites No      Capillary Blood Glucose: No results found for this or any previous visit (from the past 24 hour(s)).      Exercise Prescription Changes - 06/27/16 1200      Response to Exercise   Blood Pressure (Admit) 118/66   Blood Pressure (Exercise) 140/70   Blood Pressure (Exit) 118/70   Heart Rate (Admit) 69 bpm   Heart Rate (Exercise) 112 bpm   Heart Rate (Exit) 88 bpm   Oxygen Saturation (Admit) 98 %   Oxygen Saturation (Exercise) 93 %   Oxygen Saturation (Exit) 94 %   Rating of Perceived Exertion (Exercise) 13   Perceived Dyspnea (Exercise) 1   Duration Progress to 45 minutes of aerobic exercise without signs/symptoms of physical distress   Intensity THRR unchanged     Progression   Progression Continue to progress workloads to maintain intensity without signs/symptoms of physical distress.     Resistance Training   Training Prescription Yes   Weight orange bands   Reps 10-15   Time 10 Minutes     Interval Training   Interval Training No     Oxygen   Oxygen Continuous   Liters 2-3     Treadmill   MPH 2.2   Grade 3   Minutes 17     NuStep   Level 4   Minutes 17   METs 2.8     Arm Ergometer   Level 2   Minutes 17      History  Smoking Status  . Former Smoker  . Quit date: 01/03/1991  Smokeless Tobacco  . Never Used    Goals Met:  Exercise tolerated well No report of cardiac concerns or symptoms Strength training completed today  Goals Unmet:  Not Applicable  Comments: Service time is from 1030 to 1205    Dr. Rush Farmer is Medical Director for Pulmonary Rehab at [facility].

## 2016-06-29 ENCOUNTER — Encounter (HOSPITAL_COMMUNITY)
Admission: RE | Admit: 2016-06-29 | Discharge: 2016-06-29 | Disposition: A | Discharge status: Home / Self Care | Payer: PPO | Source: Ambulatory Visit | Attending: Pulmonary Disease | Admitting: Pulmonary Disease

## 2016-06-29 VITALS — Wt 127.6 lb

## 2016-06-29 DIAGNOSIS — J449 Chronic obstructive pulmonary disease, unspecified: Secondary | ICD-10-CM

## 2016-06-29 DIAGNOSIS — J441 Chronic obstructive pulmonary disease with (acute) exacerbation: Secondary | ICD-10-CM | POA: Diagnosis not present

## 2016-06-29 NOTE — Progress Notes (Signed)
Daily Session Note  Patient Details  Name: Megan Peterson MRN: [id number] Date of Birth: Dec 01, 1940 Referring Provider:     Pulmonary Rehab Walk Test from 05/09/2016 in [place]  Referring Provider  Dr. Elsworth Soho      Encounter Date: 06/29/2016  Check In:     Session Check In - 06/29/16 1109      Check-In   [facility]   Staff Present Su Hilt, MS, ACSM RCEP, Exercise Physiologist;Lisa Colletta [place], RN, [place] physician immediately available to respond to emergencies [facility] immediately available   Physician(s) DR, Allyson Sabal   Medication changes reported     No   Fall or balance concerns reported    No   Tobacco Cessation No Change   Warm-up and Cool-down Performed as group-led instruction   Resistance Training Performed Yes     Pain Assessment   Currently in Pain? No/denies   Multiple Pain Sites No      Capillary Blood Glucose: No results found for this or any previous visit (from the past 24 hour(s)).      Exercise Prescription Changes - 06/29/16 1200      Response to Exercise   Blood Pressure (Admit) 100/66   Blood Pressure (Exercise) 160/74   Blood Pressure (Exit) 128/70   Heart Rate (Admit) 66 bpm   Heart Rate (Exercise) 95 bpm   Heart Rate (Exit) 76 bpm   Oxygen Saturation (Admit) 99 %   Oxygen Saturation (Exercise) 97 %   Oxygen Saturation (Exit) 94 %   Rating of Perceived Exertion (Exercise) 12   Perceived Dyspnea (Exercise) 1   Duration Progress to 45 minutes of aerobic exercise without signs/symptoms of physical distress   Intensity THRR unchanged     Progression   Progression Continue to progress workloads to maintain intensity without signs/symptoms of physical distress.     Resistance Training   Training Prescription Yes   Weight orange bands   Reps 10-15   Time 10 Minutes     Interval Training   Interval Training No     Oxygen   Oxygen  Continuous   Liters 2-3     NuStep   Level 4   Minutes 17   METs 3     Arm Ergometer   Level 2   Minutes 17      History  Smoking Status  . Former Smoker  . Quit date: 01/03/1991  Smokeless Tobacco  . Never Used    Goals Met:  Exercise tolerated well No report of cardiac concerns or symptoms Strength training completed today  Goals Unmet:  Not Applicable  Comments: Service time is from 10:30a to 12:05p    Dr. Rush Farmer is Medical Director for Pulmonary Rehab at [facility].
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
## 2016-07-04 ENCOUNTER — Encounter (HOSPITAL_COMMUNITY)
Admission: RE | Admit: 2016-07-04 | Discharge: 2016-07-04 | Disposition: A | Discharge status: Home / Self Care | Payer: PPO | Source: Ambulatory Visit | Attending: Pulmonary Disease | Admitting: Pulmonary Disease

## 2016-07-04 VITALS — Wt 127.4 lb

## 2016-07-04 DIAGNOSIS — Z87891 Personal history of nicotine dependence: Secondary | ICD-10-CM | POA: Diagnosis not present

## 2016-07-04 DIAGNOSIS — F419 Anxiety disorder, unspecified: Secondary | ICD-10-CM | POA: Insufficient documentation

## 2016-07-04 DIAGNOSIS — M8580 Other specified disorders of bone density and structure, unspecified site: Secondary | ICD-10-CM | POA: Diagnosis not present

## 2016-07-04 DIAGNOSIS — Z85828 Personal history of other malignant neoplasm of skin: Secondary | ICD-10-CM | POA: Insufficient documentation

## 2016-07-04 DIAGNOSIS — E785 Hyperlipidemia, unspecified: Secondary | ICD-10-CM | POA: Diagnosis not present

## 2016-07-04 DIAGNOSIS — J441 Chronic obstructive pulmonary disease with (acute) exacerbation: Secondary | ICD-10-CM | POA: Diagnosis not present

## 2016-07-04 DIAGNOSIS — J449 Chronic obstructive pulmonary disease, unspecified: Secondary | ICD-10-CM

## 2016-07-04 NOTE — Progress Notes (Signed)
Daily Session Note  Patient Details  Name: Megan Peterson MRN: [id number] Date of Birth: [birth year] Referring Provider:     Pulmonary Rehab Walk Test from 05/09/2016 in [place]  Referring Provider  Dr. Elsworth Soho      Encounter Date: 07/04/2016  Check In:     Session Check In - 07/04/16 1030      Check-In   Location [facility]   Staff Present Rosebud Poles, RN, BSN;Molly diVincenzo, MS, ACSM RCEP, Exercise Physiologist;Lisa Ysidro Evert, RN;Sincere Berlanga Rollene Rotunda, RN, BSN   Supervising physician immediately available to respond to emergencies [facility] immediately available   Physician(s) DR, Allyson Sabal   Medication changes reported     No   Fall or balance concerns reported    No   Tobacco Cessation No Change   Warm-up and Cool-down Performed as group-led instruction   Resistance Training Performed Yes   VAD Patient? No     Pain Assessment   Currently in Pain? No/denies   Multiple Pain Sites No      Capillary Blood Glucose: No results found for this or any previous visit (from the past 24 hour(s)).      Exercise Prescription Changes - 07/04/16 1231      Response to Exercise   Blood Pressure (Admit) 100/60   Blood Pressure (Exercise) 126/64   Blood Pressure (Exit) 130/60  110/64   Heart Rate (Admit) 70 bpm   Heart Rate (Exercise) 111 bpm   Heart Rate (Exit) 83 bpm   Oxygen Saturation (Admit) 97 %   Oxygen Saturation (Exercise) 94 %   Oxygen Saturation (Exit) 83 %   Rating of Perceived Exertion (Exercise) 12   Perceived Dyspnea (Exercise) 2   Duration Progress to 45 minutes of aerobic exercise without signs/symptoms of physical distress   Intensity THRR unchanged     Progression   Progression Continue to progress workloads to maintain intensity without signs/symptoms of physical distress.     Resistance Training   Training Prescription Yes   Weight orange bands   Reps 10-15   Time 10 Minutes     Interval Training   Interval  Training No     Oxygen   Oxygen Continuous   Liters 2-3     Treadmill   MPH 2.2   Grade 3   Minutes 17     NuStep   Level 4   Minutes 17   METs 2.9     Arm Ergometer   Level 2   Minutes 17      History  Smoking Status  . Former Smoker  . Quit date: 01/03/1991  Smokeless Tobacco  . Never Used    Goals Met:  Independence with exercise equipment Improved SOB with ADL's Using PLB without cueing & demonstrates good technique Exercise tolerated well No report of cardiac concerns or symptoms Strength training completed today  Goals Unmet:  Not Applicable  Comments: Service time is from 1030 to 1210   Dr. Rush Farmer is Medical Director for Pulmonary Rehab at [facility].

## 2016-07-06 ENCOUNTER — Encounter (HOSPITAL_COMMUNITY)
Admission: RE | Admit: 2016-07-06 | Discharge: 2016-07-06 | Disposition: A | Discharge status: Home / Self Care | Payer: PPO | Source: Ambulatory Visit | Attending: Pulmonary Disease | Admitting: Pulmonary Disease

## 2016-07-06 VITALS — Wt 125.2 lb

## 2016-07-06 DIAGNOSIS — J449 Chronic obstructive pulmonary disease, unspecified: Secondary | ICD-10-CM

## 2016-07-06 DIAGNOSIS — J441 Chronic obstructive pulmonary disease with (acute) exacerbation: Secondary | ICD-10-CM | POA: Diagnosis not present

## 2016-07-06 NOTE — Progress Notes (Signed)
Daily Session Note  Patient Details  Name: Megan Peterson MRN: [id number] Date of Birth: [birth year] Referring Provider:     Pulmonary Rehab Walk Test from 05/09/2016 in [place]  Referring Provider  Dr. Elsworth Soho      Encounter Date: 07/06/2016  Check In:     Session Check In - 07/06/16 1027      Check-In   Location [facility]   Staff Present Trish Fountain, RN, Maxcine Ham, RN, BSN;Molly diVincenzo, MS, ACSM RCEP, Exercise Physiologist;Mccoy Testa Ysidro Evert, RN   Supervising physician immediately available to respond to emergencies [facility] immediately available   Physician(s) Dr. Wendee Beavers   Medication changes reported     No   Fall or balance concerns reported    No   Tobacco Cessation No Change   Warm-up and Cool-down Performed as group-led instruction   Resistance Training Performed Yes   VAD Patient? No     Pain Assessment   Currently in Pain? No/denies   Multiple Pain Sites No      Capillary Blood Glucose: No results found for this or any previous visit (from the past 24 hour(s)).      Exercise Prescription Changes - 07/06/16 1200      Response to Exercise   Blood Pressure (Admit) 120/66   Blood Pressure (Exercise) 150/70   Blood Pressure (Exit) 96/60   Heart Rate (Admit) 71 bpm   Heart Rate (Exercise) 104 bpm   Heart Rate (Exit) 78 bpm   Oxygen Saturation (Admit) 96 %   Oxygen Saturation (Exercise) 95 %   Oxygen Saturation (Exit) 94 %   Rating of Perceived Exertion (Exercise) 12   Perceived Dyspnea (Exercise) 1   Duration Progress to 45 minutes of aerobic exercise without signs/symptoms of physical distress   Intensity THRR unchanged     Progression   Progression Continue to progress workloads to maintain intensity without signs/symptoms of physical distress.     Resistance Training   Training Prescription Yes   Weight orange bands   Reps 10-15   Time 10 Minutes     Interval Training   Interval Training  No     Oxygen   Oxygen Continuous   Liters 2-3     Treadmill   MPH 2.2   Grade 3   Minutes 17     NuStep   Level 4   Minutes 17   METs 2.6      History  Smoking Status  . Former Smoker  . Quit date: 01/03/1991  Smokeless Tobacco  . Never Used    Goals Met:  Exercise tolerated well No report of cardiac concerns or symptoms Strength training completed today  Goals Unmet:  Not Applicable  Comments: Service time is from 1030 to 1210    Dr. Rush Farmer is Medical Director for Pulmonary Rehab at [facility].

## 2016-07-06 NOTE — Progress Notes (Signed)
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 nutrients.;Long Term Goal: Adherence to prescribed nutrition plan.      Nutrition Discharge: Rate Your Plate Scores:     Nutrition Assessments - 06/29/16 1212      Rate Your Plate Scores   Pre Score 43      Nutrition Goals Re-Evaluation:   Nutrition Goals Discharge (Final Nutrition Goals Re-Evaluation):   Psychosocial: Target Goals: Acknowledge presence or absence of significant depression and/or stress, maximize coping skills, provide positive support system. Participant is able to verbalize types and ability to use techniques and skills needed for reducing stress and depression.  Initial Review & Psychosocial Screening:     Initial Psych Review & Screening - 05/08/16 1500      Initial Review   Current issues with None Identified     Family Dynamics   Good Support System? Yes     Barriers    Psychosocial barriers to participate in program There are no identifiable barriers or psychosocial needs.     Screening Interventions   Interventions Encouraged to exercise      Quality of Life Scores:   PHQ-9: Recent Review Flowsheet Data    Depression screen [facility] 2/9 05/08/2016 04/11/2016 02/25/2015 12/12/2013   Decreased Interest 0 0 1 0   Down, Depressed, Hopeless 0 0 2 0   PHQ - 2 Score 0 0 3 0     Interpretation of Total Score  Total Score Depression Severity:  1-4 = Minimal depression, 5-9 = Mild depression, 10-14 = Moderate depression, 15-19 = Moderately severe depression, 20-27 = Severe depression   Psychosocial Evaluation and Intervention:     Psychosocial Evaluation - 05/08/16 1500      Psychosocial Evaluation & Interventions   Interventions Encouraged to exercise with the program and follow exercise prescription   Continue Psychosocial Services  No Follow up required      Psychosocial Re-Evaluation:     Psychosocial Re-Evaluation    [place] Name 06/06/16 1631 07/06/16 0725           Psychosocial Re-Evaluation   Current issues with None Identified None Identified      Interventions Encouraged to attend Pulmonary Rehabilitation for the exercise Encouraged to attend Pulmonary Rehabilitation for the exercise      Continue Psychosocial Services  No Follow up required No Follow up required         Psychosocial Discharge (Final Psychosocial Re-Evaluation):     Psychosocial Re-Evaluation - 07/06/16 0725      Psychosocial Re-Evaluation   Current issues with None Identified   Interventions Encouraged to attend Pulmonary Rehabilitation for the exercise   Continue Psychosocial Services  No Follow up required      Education: Education Goals: Education classes will be provided on a weekly basis, covering required topics. Participant will state understanding/return demonstration of topics presented.  Learning Barriers/Preferences:   Education Topics: Risk  Factor Reduction:  -Group instruction that is supported by a PowerPoint presentation. Instructor discusses the definition of a risk factor, different risk factors for pulmonary disease, and how the heart and lungs work together.     Nutrition for Pulmonary Patient:  -Group instruction provided by PowerPoint slides, verbal discussion, and written materials to support subject matter. The instructor gives an explanation and review of healthy diet recommendations, which includes a discussion on weight management, recommendations for fruit and vegetable consumption, as well as protein, fluid, caffeine, fiber,  sodium, sugar, and alcohol. Tips for eating when patients are short of breath are discussed.   PULMONARY REHAB CHRONIC OBSTRUCTIVE PULMONARY DISEASE from 07/06/2016 in [place]  Date  06/22/16  Educator  [employer]  Instruction Review Code  2- meets goals/outcomes      Pursed Lip Breathing:  -Group instruction that is supported by demonstration and informational handouts. Instructor discusses the benefits of pursed lip and diaphragmatic breathing and detailed demonstration on how to preform both.     Oxygen Safety:  -Group instruction provided by PowerPoint, verbal discussion, and written material to support subject matter. There is an overview of "What is Oxygen" and "Why do we need it".  Instructor also reviews how to create a safe environment for oxygen use, the importance of using oxygen as prescribed, and the risks of noncompliance. There is a brief discussion on traveling with oxygen and resources the patient may utilize.   PULMONARY REHAB CHRONIC OBSTRUCTIVE PULMONARY DISEASE from 07/06/2016 in [place]  Date  06/15/16  Educator  Romana Deaton  Instruction Review Code  2- meets goals/outcomes      Oxygen Equipment:  -Group instruction provided by [employer] Staff utilizing handouts, written materials, and equipment  demonstrations.   PULMONARY REHAB CHRONIC OBSTRUCTIVE PULMONARY DISEASE from 07/06/2016 in [place]  Date  05/25/16  Educator  [employer]  Instruction Review Code  2- meets goals/outcomes      Signs and Symptoms:  -Group instruction provided by written material and verbal discussion to support subject matter. Warning signs and symptoms of infection, stroke, and heart attack are reviewed and when to call the physician/911 reinforced. Tips for preventing the spread of infection discussed.   Advanced Directives:  -Group instruction provided by verbal instruction and written material to support subject matter. Instructor reviews Advanced Directive laws and proper instruction for filling out document.   Pulmonary Video:  -Group video education that reviews the importance of medication and oxygen compliance, exercise, good nutrition, pulmonary hygiene, and pursed lip and diaphragmatic breathing for the pulmonary patient.   Exercise for the Pulmonary Patient:  -Group instruction that is supported by a PowerPoint presentation. Instructor discusses benefits of exercise, core components of exercise, frequency, duration, and intensity of an exercise routine, importance of utilizing pulse oximetry during exercise, safety while exercising, and options of places to exercise outside of rehab.     PULMONARY REHAB CHRONIC OBSTRUCTIVE PULMONARY DISEASE from 07/06/2016 in [place]  Date  06/29/16  Educator  ep  Instruction Review Code  2- meets goals/outcomes      Pulmonary Medications:  -Verbally interactive group education provided by instructor with focus on inhaled medications and proper administration.   PULMONARY REHAB CHRONIC OBSTRUCTIVE PULMONARY DISEASE from 07/06/2016 in [place]  Date  05/18/16  Educator  Pharm  Instruction Review Code  2- meets goals/outcomes      Anatomy and Physiology  of the Respiratory System and Intimacy:  -Group instruction provided by PowerPoint, verbal discussion, and written material to support subject matter. Instructor reviews respiratory cycle and anatomical components of the respiratory system and their functions. Instructor also reviews differences in obstructive and restrictive respiratory diseases with examples of each. Intimacy, Sex, and Sexuality differences are reviewed with a discussion on how relationships can change when diagnosed with pulmonary disease. Common sexual concerns are reviewed.   MD DAY -A group question and answer session with a medical  doctor that allows participants to ask questions that relate to their pulmonary disease state.   PULMONARY REHAB CHRONIC OBSTRUCTIVE PULMONARY DISEASE from 07/06/2016 in [place]  Date  07/06/16  Educator  [employer]  Instruction Review Code  2- meets goals/outcomes      OTHER EDUCATION -Group or individual verbal, written, or video instructions that support the educational goals of the pulmonary rehab program.   Knowledge Questionnaire Score:     Knowledge Questionnaire Score - 05/17/16 1403      Knowledge Questionnaire Score   Pre Score 10/13      Core Components/Risk Factors/Patient Goals at Admission:     Personal Goals and Risk Factors at Admission - 05/08/16 1459      Core Components/Risk Factors/Patient Goals on Admission   Improve shortness of breath with ADL's Yes   Intervention Provide education, individualized exercise plan and daily activity instruction to help decrease symptoms of SOB with activities of daily living.   Expected Outcomes Short Term: Achieves a reduction of symptoms when performing activities of daily living.   Develop more efficient breathing techniques such as purse lipped breathing and diaphragmatic breathing; and practicing self-pacing with activity Yes   Intervention Provide education, demonstration and support about  specific breathing techniuqes utilized for more efficient breathing. Include techniques such as pursed lipped breathing, diaphragmatic breathing and self-pacing activity.   Expected Outcomes Short Term: Participant will be able to demonstrate and use breathing techniques as needed throughout daily activities.   Increase knowledge of respiratory medications and ability to use respiratory devices properly  Yes   Intervention Provide education and demonstration as needed of appropriate use of medications, inhalers, and oxygen therapy.   Expected Outcomes Short Term: Achieves understanding of medications use. Understands that oxygen is a medication prescribed by physician. Demonstrates appropriate use of inhaler and oxygen therapy.      Core Components/Risk Factors/Patient Goals Review:      Goals and Risk Factor Review    Row Name 06/06/16 1631 07/06/16 0724           Core Components/Risk Factors/Patient Goals Review   Personal Goals Review Develop more efficient breathing techniques such as purse lipped breathing and diaphragmatic breathing and practicing self-pacing with activity.;Improve shortness of breath with ADL's;Increase knowledge of respiratory medications and ability to use respiratory devices properly. Develop more efficient breathing techniques such as purse lipped breathing and diaphragmatic breathing and practicing self-pacing with activity.;Improve shortness of breath with ADL's;Increase knowledge of respiratory medications and ability to use respiratory devices properly.      Review see comment section on ITP patient verbalizes an improvement in her shortness of breath while completing her ADLs      Expected Outcomes see admission expected outcomes see admission expected outcomes         Core Components/Risk Factors/Patient Goals at Discharge (Final Review):      Goals and Risk Factor Review - 07/06/16 0724      Core Components/Risk Factors/Patient Goals Review   Personal  Goals Review Develop more efficient breathing techniques such as purse lipped breathing and diaphragmatic breathing and practicing self-pacing with activity.;Improve shortness of breath with ADL's;Increase knowledge of respiratory medications and ability to use respiratory devices properly.   Review patient verbalizes an improvement in her shortness of breath while completing her ADLs   Expected Outcomes see admission expected outcomes      ITP Comments:   Comments: ITP REVIEW Pt is making expected progress toward pulmonary rehab goals after  completing 16 sessions. Recommend continued exercise, life style modification, education, and utilization of breathing techniques to increase stamina and strength and decrease shortness of breath with exertion.

## 2016-07-07 ENCOUNTER — Telehealth: Payer: Self-pay | Admitting: Family Medicine

## 2016-07-07 NOTE — Telephone Encounter (Signed)
Pt has appt with Dr Damita Dunnings 07/10/16 at 10:30.

## 2016-07-07 NOTE — Telephone Encounter (Signed)
Patient Name: Megan Peterson  DOB: 24-Mar-1940    Initial Comment Caller states she has knot behind her knee, and having knee pain.   Nurse Assessment  Nurse: Verlin Fester RN, Stanton Kidney Date/Time (Eastern Time): 07/07/2016 9:59:12 AM  Confirm and document reason for call. If symptomatic, describe symptoms. ---Patient states she has a knot behind her left knee and she is having pain  Does the patient have any new or worsening symptoms? ---Yes  Will a triage be completed? ---Yes  Related visit to physician within the last 2 weeks? ---No  Does the PT have any chronic conditions? (i.e. diabetes, asthma, etc.) ---Yes  List chronic conditions. ---"heart,  Is this a behavioral health or substance abuse call? ---No     Guidelines    Guideline Title Affirmed Question Affirmed Notes  Knee Swelling [1] MODERATE pain (e.g., interferes with normal activities, limping) AND [2] present > 3 days    Final Disposition User   See PCP When Office is Open (within 3 days) Verlin Fester, RN, [facility]    Referrals  REFERRED TO PCP OFFICE   Disagree/Comply: Leta Baptist

## 2016-07-07 NOTE — Telephone Encounter (Signed)
Noted. Thanks. If worse over the weekend, then see eval sooner.

## 2016-07-10 ENCOUNTER — Ambulatory Visit (INDEPENDENT_AMBULATORY_CARE_PROVIDER_SITE_OTHER): Payer: PPO | Admitting: Family Medicine

## 2016-07-10 ENCOUNTER — Encounter: Payer: Self-pay | Admitting: Family Medicine

## 2016-07-10 VITALS — BP 122/80 | HR 76 | Temp 98.1°F | Wt 126.0 lb

## 2016-07-10 DIAGNOSIS — R892 Abnormal level of other drugs, medicaments and biological substances in specimens from other organs, systems and tissues: Secondary | ICD-10-CM | POA: Diagnosis not present

## 2016-07-10 DIAGNOSIS — M25562 Pain in left knee: Secondary | ICD-10-CM | POA: Insufficient documentation

## 2016-07-10 HISTORY — DX: Pain in left knee: M25.562

## 2016-07-10 MED ORDER — GUAIFENESIN ER 600 MG PO TB12: 600.0000 mg | ORAL_TABLET | Freq: Every day | ORAL | Status: DC

## 2016-07-10 MED ORDER — UMECLIDINIUM-VILANTEROL 62.5-25 MCG/INH IN AEPB: 1.0000 | INHALATION_SPRAY | Freq: Every day | RESPIRATORY_TRACT | Status: DC

## 2016-07-10 NOTE — Assessment & Plan Note (Signed)
See above. I did not refill her Xanax. She has not used it recently per her report so she is not at risk for withdrawal.

## 2016-07-10 NOTE — Patient Instructions (Signed)
Go to the lab on the way out.  We'll contact you with your lab report. Ibuprofen 600mg  twice a day with food in the meantime.  Put and ice bag on your knee for 5 minutes at a time.  Take care.

## 2016-07-10 NOTE — Progress Notes (Signed)
L knee sx.  Had been exercising at the hospital, treadmill and bike.  She had a knot w/o pain initially, but then had more pain.  All in the last week.  No injury.  No R sided pain.  Only with L posterior knot.  She does have some patellar discomfort.  No h/o clot prev.  Sister with h/o PE per patient report.  The pain is gradually getting worse.  No redness noted by patient.  No fevers.  Ibuprofen helped some.    She hasn't been taking xanax recently.  D/w pt about prev use vs non use and her prev neg uds when she had #90 rx'd per month.  She admitted she didn't need/use 90 per month prev.  She told me "I didn't need 90 but I was getting 90" pills per month.  She was inconsistent with her answers, saying she needed the medication everyday previously, but then saying she was not taking it every day. She changed the subject to speak poorly of her previous primary care provider.  She said "he was being a butt about the medicine."  I told her she can think whatever she wanted to about her prev doc but it did absolutely no good to come in and speak poorly of her previous doctor especially when she was previously asking for inappropriate refills in the setting of a negative urine drug screen where she could not demonstrate that she was actually using the medication daily.  If she disagrees then she can seek care elsewhere, and I told her that.  I didn't refill rx today and she is aware.   D/w pt about O2 use, only PRN during the day (with exercise) and also at night.    Meds, vitals, and allergies reviewed.   ROS: Per HPI unless specifically indicated in ROS section   nad ncat rrr ctab abd soft L knee with no joint line tenderness but patella is slightly ttp and she has mild popliteal fullness posteriorly.  No calf tenderness.  No bruising or redness locally.  She has some crepitus noted on ROM.  Able to bear weight.

## 2016-07-10 NOTE — Assessment & Plan Note (Addendum)
Likely a Baker's cyst, due to exacerbation of underlying arthritis. Discussed with patient. Would take ibuprofen 600 mg twice a day with food. If not improved we may need to transition her over to a brief course of prednisone. Check d-dimer in the meantime, discussed with patient about possible false positive results. If positive she is aware she would likely need ultrasound to exclude DVT. >25 minutes spent in face to face time with patient, >50% spent in counselling or coordination of care.

## 2016-07-11 ENCOUNTER — Ambulatory Visit (HOSPITAL_COMMUNITY)
Admission: RE | Admit: 2016-07-11 | Discharge: 2016-07-11 | Disposition: A | Discharge status: Home / Self Care | Payer: PPO | Source: Ambulatory Visit | Attending: Cardiovascular Disease | Admitting: Cardiovascular Disease

## 2016-07-11 ENCOUNTER — Other Ambulatory Visit: Payer: Self-pay | Admitting: Family Medicine

## 2016-07-11 ENCOUNTER — Encounter (HOSPITAL_COMMUNITY): Payer: PPO

## 2016-07-11 DIAGNOSIS — M79609 Pain in unspecified limb: Secondary | ICD-10-CM | POA: Diagnosis not present

## 2016-07-11 DIAGNOSIS — M7989 Other specified soft tissue disorders: Secondary | ICD-10-CM | POA: Diagnosis not present

## 2016-07-11 DIAGNOSIS — R7989 Other specified abnormal findings of blood chemistry: Secondary | ICD-10-CM | POA: Diagnosis not present

## 2016-07-11 LAB — D-DIMER, QUANTITATIVE: D-Dimer, Quant: 0.71 mcg/mL FEU — ABNORMAL HIGH (ref <0.50)

## 2016-07-12 ENCOUNTER — Telehealth: Payer: Self-pay | Admitting: Family Medicine

## 2016-07-12 NOTE — Telephone Encounter (Signed)
Pt returned your call  [phone]

## 2016-07-12 NOTE — Telephone Encounter (Signed)
Returned patient's call and test results were given to her. 

## 2016-07-13 ENCOUNTER — Other Ambulatory Visit: Payer: Self-pay | Admitting: Family Medicine

## 2016-07-13 ENCOUNTER — Other Ambulatory Visit: Payer: Self-pay

## 2016-07-13 ENCOUNTER — Telehealth (HOSPITAL_COMMUNITY): Payer: Self-pay | Admitting: Family Medicine

## 2016-07-13 ENCOUNTER — Encounter (HOSPITAL_COMMUNITY): Admission: RE | Admit: 2016-07-13 | Payer: PPO | Source: Ambulatory Visit

## 2016-07-13 NOTE — Telephone Encounter (Signed)
Electronic refill request. Gabapentin Last office visit:   07/10/16 Last Filled:    270 tablet 1 05/12/2016  Please advise.

## 2016-07-13 NOTE — Telephone Encounter (Signed)
Patient called requesting a refill. Last refill 06/15/16 #120 Last OV 07/10/16 Ok to refill?

## 2016-07-14 ENCOUNTER — Encounter: Payer: Self-pay | Admitting: Adult Health

## 2016-07-14 ENCOUNTER — Ambulatory Visit (INDEPENDENT_AMBULATORY_CARE_PROVIDER_SITE_OTHER): Payer: PPO | Admitting: Adult Health

## 2016-07-14 ENCOUNTER — Other Ambulatory Visit: Payer: Self-pay | Admitting: *Deleted

## 2016-07-14 ENCOUNTER — Other Ambulatory Visit: Payer: Self-pay | Admitting: Family Medicine

## 2016-07-14 DIAGNOSIS — J9611 Chronic respiratory failure with hypoxia: Secondary | ICD-10-CM

## 2016-07-14 DIAGNOSIS — J439 Emphysema, unspecified: Secondary | ICD-10-CM | POA: Diagnosis not present

## 2016-07-14 MED ORDER — HYDROCODONE-ACETAMINOPHEN 5-325 MG PO TABS: 1.0000 | ORAL_TABLET | Freq: Four times a day (QID) | ORAL | 0 refills | Status: DC | PRN

## 2016-07-14 MED ORDER — GABAPENTIN 800 MG PO TABS: 800.0000 mg | ORAL_TABLET | Freq: Three times a day (TID) | ORAL | 1 refills | Status: DC

## 2016-07-14 NOTE — Assessment & Plan Note (Signed)
Check ONO on ra to see if O2 is needed At bedtime .

## 2016-07-14 NOTE — Telephone Encounter (Signed)
Printed.  Thanks.  

## 2016-07-14 NOTE — Telephone Encounter (Signed)
She should have a refill left.  Thanks.
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
## 2016-07-14 NOTE — Assessment & Plan Note (Signed)
Moderate to severe COPD Primary function test shows similar results as in 2013. Her DLCO did decrease slightly. Patient is to continue on [facility] .  Continue with pulmonary rehabilitation.  Plan  Patient Instructions  Continue on ANORO daily . Rinse after use. Check ONO on room air.  Continue with pulmonary rehab.  follow up Dr. Elsworth Soho  In 4 months and As needed

## 2016-07-14 NOTE — Telephone Encounter (Signed)
Patient advised.  Rx left at front desk for pick up. 

## 2016-07-14 NOTE — Addendum Note (Signed)
Addended by: Parke Poisson E on: 07/14/2016 05:38 PM   Modules accepted: Orders

## 2016-07-14 NOTE — Patient Instructions (Signed)
Continue on ANORO daily . Rinse after use. Check ONO on room air.  Continue with pulmonary rehab.  follow up Dr. Elsworth Soho  In 4 months and As needed

## 2016-07-17 ENCOUNTER — Telehealth: Payer: Self-pay | Admitting: Adult Health

## 2016-07-17 NOTE — Telephone Encounter (Signed)
Pt returned my call to let me know she is using [facility] now.  I have sent ONO order to them.  Nothing further needed.

## 2016-07-17 NOTE — Telephone Encounter (Signed)
Will route to [facility], as pt is returning call in regards to ONO.

## 2016-07-18 ENCOUNTER — Encounter (HOSPITAL_COMMUNITY)
Admission: RE | Admit: 2016-07-18 | Discharge: 2016-07-18 | Disposition: A | Discharge status: Home / Self Care | Payer: PPO | Source: Ambulatory Visit | Attending: Pulmonary Disease | Admitting: Pulmonary Disease

## 2016-07-18 VITALS — Wt 127.2 lb

## 2016-07-18 DIAGNOSIS — J449 Chronic obstructive pulmonary disease, unspecified: Secondary | ICD-10-CM

## 2016-07-18 DIAGNOSIS — J441 Chronic obstructive pulmonary disease with (acute) exacerbation: Secondary | ICD-10-CM | POA: Diagnosis not present

## 2016-07-18 NOTE — Progress Notes (Signed)
Daily Session Note  Patient Details  Name: Megan Peterson MRN: [id number] Date of Birth: [birth year] Referring Provider:     Pulmonary Rehab Walk Test from 05/09/2016 in [place]  Referring Provider  Dr. Elsworth Soho      Encounter Date: 07/18/2016  Check In:     Session Check In - 07/18/16 1017      Check-In   Location [facility]   Staff Present Trish Fountain, RN, Maxcine Ham, RN, BSN;Amillion Macchia, MS, ACSM RCEP, Exercise Physiologist;Lisa Ysidro Evert, RN   Supervising physician immediately available to respond to emergencies [facility] immediately available   Physician(s) Dr. Maryland Pink   Medication changes reported     No   Fall or balance concerns reported    No   Tobacco Cessation No Change   Warm-up and Cool-down Performed as group-led instruction   Resistance Training Performed Yes   VAD Patient? No     Pain Assessment   Currently in Pain? No/denies   Multiple Pain Sites No      Capillary Blood Glucose: No results found for this or any previous visit (from the past 24 hour(s)).      Exercise Prescription Changes - 07/18/16 1200      Response to Exercise   Blood Pressure (Admit) 130/68   Blood Pressure (Exercise) 132/70   Blood Pressure (Exit) 104/70   Heart Rate (Admit) 69 bpm   Heart Rate (Exercise) 105 bpm   Heart Rate (Exit) 89 bpm   Oxygen Saturation (Admit) 94 %   Oxygen Saturation (Exercise) 93 %   Oxygen Saturation (Exit) 94 %   Rating of Perceived Exertion (Exercise) 12   Perceived Dyspnea (Exercise) 1   Duration Progress to 45 minutes of aerobic exercise without signs/symptoms of physical distress   Intensity THRR unchanged     Progression   Progression Continue to progress workloads to maintain intensity without signs/symptoms of physical distress.     Resistance Training   Training Prescription Yes   Weight orange bands   Reps 10-15   Time 10 Minutes     Interval Training   Interval  Training No     Oxygen   Oxygen Continuous   Liters 2-3     Treadmill   MPH 2.2   Grade 3   Minutes 17     NuStep   Level 4   Minutes 17   METs 2.4     Arm Ergometer   Level 2   Minutes 17      History  Smoking Status  . Former Smoker  . Quit date: 01/03/1991  Smokeless Tobacco  . Never Used    Goals Met:  Exercise tolerated well No report of cardiac concerns or symptoms Strength training completed today  Goals Unmet:  Not Applicable  Comments: Service time is from 10:30a to 12:00p    Dr. Rush Farmer is Medical Director for Pulmonary Rehab at [facility].

## 2016-07-19 DIAGNOSIS — J9601 Acute respiratory failure with hypoxia: Secondary | ICD-10-CM | POA: Diagnosis not present

## 2016-07-19 DIAGNOSIS — J439 Emphysema, unspecified: Secondary | ICD-10-CM | POA: Diagnosis not present

## 2016-07-19 DIAGNOSIS — J449 Chronic obstructive pulmonary disease, unspecified: Secondary | ICD-10-CM | POA: Diagnosis not present

## 2016-07-19 MED ORDER — UMECLIDINIUM-VILANTEROL 62.5-25 MCG/INH IN AEPB: 1.0000 | INHALATION_SPRAY | Freq: Every day | RESPIRATORY_TRACT | 0 refills | Status: DC

## 2016-07-19 NOTE — Addendum Note (Signed)
Addended by: Parke Poisson E on: 07/19/2016 12:28 PM   Modules accepted: Orders

## 2016-07-20 ENCOUNTER — Encounter (HOSPITAL_COMMUNITY)
Admission: RE | Admit: 2016-07-20 | Discharge: 2016-07-20 | Disposition: A | Discharge status: Home / Self Care | Payer: PPO | Source: Ambulatory Visit | Attending: Pulmonary Disease | Admitting: Pulmonary Disease

## 2016-07-20 VITALS — Wt 125.9 lb

## 2016-07-20 DIAGNOSIS — J441 Chronic obstructive pulmonary disease with (acute) exacerbation: Secondary | ICD-10-CM | POA: Diagnosis not present

## 2016-07-20 DIAGNOSIS — J449 Chronic obstructive pulmonary disease, unspecified: Secondary | ICD-10-CM

## 2016-07-20 NOTE — Progress Notes (Signed)
Daily Session Note  Patient Details  Name: Megan Peterson MRN: [id number] Date of Birth: 05/30/1940 Referring Provider:     Pulmonary Rehab Walk Test from 05/09/2016 in [place]  Referring Provider  Dr. Elsworth Soho      Encounter Date: 07/20/2016  Check In:     Session Check In - 07/20/16 1056      Check-In   Location [facility]   Staff Present Su Hilt, MS, ACSM RCEP, Exercise Physiologist;Joan Leonia Reeves, RN, BSN;Lisa Hughes, RN;Whittney Steenson Rollene Rotunda, RN, BSN   Supervising physician immediately available to respond to emergencies [facility] immediately available   Physician(s) Dr. Burnis Medin   Medication changes reported     No   Fall or balance concerns reported    No   Tobacco Cessation No Change   Warm-up and Cool-down Performed as group-led instruction   Resistance Training Performed Yes   VAD Patient? No     Pain Assessment   Currently in Pain? No/denies   Multiple Pain Sites No      Capillary Blood Glucose: No results found for this or any previous visit (from the past 24 hour(s)).      Exercise Prescription Changes - 07/20/16 1313      Response to Exercise   Blood Pressure (Admit) 100/70   Blood Pressure (Exercise) 116/60   Blood Pressure (Exit) 110/60   Heart Rate (Admit) 65 bpm   Heart Rate (Exercise) 98 bpm   Heart Rate (Exit) 75 bpm   Oxygen Saturation (Admit) 99 %   Oxygen Saturation (Exercise) 94 %   Oxygen Saturation (Exit) 93 %   Rating of Perceived Exertion (Exercise) 12   Perceived Dyspnea (Exercise) 1   Duration Progress to 45 minutes of aerobic exercise without signs/symptoms of physical distress   Intensity THRR unchanged     Progression   Progression Continue to progress workloads to maintain intensity without signs/symptoms of physical distress.     Resistance Training   Training Prescription Yes   Weight orange bands   Reps 10-15   Time 10 Minutes     Interval Training   Interval Training  No     Oxygen   Oxygen Continuous   Liters 2-3     Treadmill   MPH 2.2   Grade 3   Minutes 17     Arm Ergometer   Level 2   Minutes 17      History  Smoking Status  . Former Smoker  . Quit date: 01/03/1991  Smokeless Tobacco  . Never Used    Goals Met:  Independence with exercise equipment Improved SOB with ADL's Using PLB without cueing & demonstrates good technique Exercise tolerated well No report of cardiac concerns or symptoms Strength training completed today  Goals Unmet:  Not Applicable  Comments: Service time is from 1030 to 1230   Dr. Rush Farmer is Medical Director for Pulmonary Rehab at [facility].

## 2016-07-25 ENCOUNTER — Emergency Department (HOSPITAL_COMMUNITY)
Admission: EM | Admit: 2016-07-25 | Discharge: 2016-07-25 | Disposition: A | Discharge status: Home / Self Care | Payer: PPO | Attending: Emergency Medicine | Admitting: Emergency Medicine

## 2016-07-25 ENCOUNTER — Encounter (HOSPITAL_COMMUNITY): Payer: Self-pay | Admitting: Emergency Medicine

## 2016-07-25 ENCOUNTER — Telehealth: Payer: Self-pay | Admitting: Family Medicine

## 2016-07-25 ENCOUNTER — Emergency Department (HOSPITAL_COMMUNITY): Payer: PPO

## 2016-07-25 ENCOUNTER — Encounter (HOSPITAL_COMMUNITY)
Admission: RE | Admit: 2016-07-25 | Discharge: 2016-07-25 | Disposition: A | Discharge status: Home / Self Care | Payer: PPO | Source: Ambulatory Visit | Attending: Pulmonary Disease | Admitting: Pulmonary Disease

## 2016-07-25 VITALS — Wt 127.4 lb

## 2016-07-25 DIAGNOSIS — Z79899 Other long term (current) drug therapy: Secondary | ICD-10-CM | POA: Diagnosis not present

## 2016-07-25 DIAGNOSIS — Z87891 Personal history of nicotine dependence: Secondary | ICD-10-CM | POA: Insufficient documentation

## 2016-07-25 DIAGNOSIS — M5417 Radiculopathy, lumbosacral region: Secondary | ICD-10-CM | POA: Diagnosis not present

## 2016-07-25 DIAGNOSIS — M62830 Muscle spasm of back: Secondary | ICD-10-CM | POA: Insufficient documentation

## 2016-07-25 DIAGNOSIS — J449 Chronic obstructive pulmonary disease, unspecified: Secondary | ICD-10-CM | POA: Insufficient documentation

## 2016-07-25 DIAGNOSIS — M5416 Radiculopathy, lumbar region: Secondary | ICD-10-CM

## 2016-07-25 DIAGNOSIS — J441 Chronic obstructive pulmonary disease with (acute) exacerbation: Secondary | ICD-10-CM | POA: Diagnosis not present

## 2016-07-25 DIAGNOSIS — M1611 Unilateral primary osteoarthritis, right hip: Secondary | ICD-10-CM | POA: Diagnosis not present

## 2016-07-25 DIAGNOSIS — M545 Low back pain: Secondary | ICD-10-CM | POA: Diagnosis present

## 2016-07-25 MED ORDER — PREDNISONE 20 MG PO TABS
60.0000 mg | ORAL_TABLET | Freq: Once | ORAL | Status: AC
  Administered 2016-07-25: 60 mg via ORAL
  Filled 2016-07-25: qty 3

## 2016-07-25 MED ORDER — KETOROLAC TROMETHAMINE 30 MG/ML IJ SOLN
30.0000 mg | Freq: Once | INTRAMUSCULAR | Status: AC
  Administered 2016-07-25: 30 mg via INTRAVENOUS
  Filled 2016-07-25: qty 1

## 2016-07-25 MED ORDER — PREDNISONE 10 MG PO TABS: 40.0000 mg | ORAL_TABLET | Freq: Every day | ORAL | 0 refills | Status: AC

## 2016-07-25 MED ORDER — DIAZEPAM 5 MG PO TABS: 5.0000 mg | ORAL_TABLET | Freq: Two times a day (BID) | ORAL | 0 refills | Status: DC

## 2016-07-25 MED ORDER — DIAZEPAM 5 MG PO TABS
5.0000 mg | ORAL_TABLET | Freq: Once | ORAL | Status: AC
  Administered 2016-07-25: 5 mg via ORAL
  Filled 2016-07-25: qty 1

## 2016-07-25 NOTE — Telephone Encounter (Signed)
Patient Name: AJAYA CRUTCHFIELD  DOB: Mar 31, 1940    Initial Comment Caller says, mother screamed out in pain, unable to walk, Right now she sitting still.    Nurse Assessment  Nurse: Julien Girt, RN, Almyra Free Date/Time Eilene Ghazi Time): 07/25/2016 2:30:12 PM  Confirm and document reason for call. If symptomatic, describe symptoms. ---Caller states her mother got out of the car, barely made it to the house. She screamed out in pain after she was in the house, c/o rt hip pain. She feels like her hip has a "catch in it". Spoke to pt, states her left leg is weak and if she turns to the left the pain is excruciating.  Does the patient have any new or worsening symptoms? ---Yes  Will a triage be completed? ---Yes  Related visit to physician within the last 2 weeks? ---No  Does the PT have any chronic conditions? (i.e. diabetes, asthma, etc.) ---Yes  List chronic conditions. ---Chronic respiratory failure with hypoxia, Chronic leg/back pain, Baker's Cyst left knee  Is this a behavioral health or substance abuse call? ---No     Guidelines    Guideline Title Affirmed Question Affirmed Notes  Hip Pain Can't stand (bear weight) or walk    Final Disposition User   Go to ED Now Julien Girt, RN, Almyra Free    Referrals  [facility] - ED   Disagree/Comply: Comply
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
## 2016-07-25 NOTE — Telephone Encounter (Signed)
Await ER notes, thanks.

## 2016-07-25 NOTE — Telephone Encounter (Signed)
Per chart review tab pt is at [place] ED. 

## 2016-07-25 NOTE — Progress Notes (Signed)
Daily Session Note  Patient Details  Name: Megan Peterson MRN: [id number] Date of Birth: 21-Dec-1940 Referring Provider:     Pulmonary Rehab Walk Test from 05/09/2016 in [place]  Referring Provider  Dr. Elsworth Soho      Encounter Date: 07/25/2016  Check In:     Session Check In - 07/25/16 1018      Check-In   Location [facility]   Staff Present Rosebud Poles, RN, BSN;Lisa Ysidro Evert, RN;Molly diVincenzo, MS, ACSM RCEP, Exercise Physiologist   Supervising physician immediately available to respond to emergencies [facility] immediately available   Physician(s) Dr. Burnis Medin   Medication changes reported     No   Fall or balance concerns reported    No   Tobacco Cessation No Change   Warm-up and Cool-down Performed as group-led instruction   Resistance Training Performed Yes   VAD Patient? No     Pain Assessment   Currently in Pain? No/denies   Multiple Pain Sites No      Capillary Blood Glucose: No results found for this or any previous visit (from the past 24 hour(s)).      Exercise Prescription Changes - 07/25/16 1200      Response to Exercise   Blood Pressure (Admit) 122/64   Blood Pressure (Exercise) 150/70   Blood Pressure (Exit) 130/80   Heart Rate (Admit) 61 bpm   Heart Rate (Exercise) 102 bpm   Heart Rate (Exit) 81 bpm   Oxygen Saturation (Admit) 96 %   Oxygen Saturation (Exercise) 95 %   Oxygen Saturation (Exit) 95 %   Rating of Perceived Exertion (Exercise) 14   Perceived Dyspnea (Exercise) 1   Duration Progress to 45 minutes of aerobic exercise without signs/symptoms of physical distress   Intensity THRR unchanged     Progression   Progression Continue to progress workloads to maintain intensity without signs/symptoms of physical distress.     Resistance Training   Training Prescription Yes   Weight orange bands   Reps 10-15   Time 10 Minutes     Interval Training   Interval Training No     Oxygen   Oxygen Continuous   Liters 2-3     Treadmill   MPH 2.2   Grade 3   Minutes 17     NuStep   Level 5   Minutes 17   METs 2.7     Arm Ergometer   Level 2   Minutes 17      History  Smoking Status  . Former Smoker  . Quit date: 01/03/1991  Smokeless Tobacco  . Never Used    Goals Met:  Exercise tolerated well Strength training completed today  Goals Unmet:  Not Applicable  Comments: Service time is from 1030 to 1200    Dr. Rush Farmer is Medical Director for Pulmonary Rehab at [facility].

## 2016-07-26 ENCOUNTER — Encounter: Payer: Self-pay | Admitting: Adult Health

## 2016-07-27 ENCOUNTER — Telehealth (HOSPITAL_COMMUNITY): Payer: Self-pay | Admitting: Family Medicine

## 2016-07-27 ENCOUNTER — Encounter (HOSPITAL_COMMUNITY): Payer: PPO

## 2016-07-28 ENCOUNTER — Encounter: Payer: Self-pay | Admitting: Family Medicine

## 2016-07-28 ENCOUNTER — Ambulatory Visit (INDEPENDENT_AMBULATORY_CARE_PROVIDER_SITE_OTHER): Payer: PPO | Admitting: Family Medicine

## 2016-07-28 DIAGNOSIS — M5410 Radiculopathy, site unspecified: Secondary | ICD-10-CM | POA: Diagnosis not present

## 2016-07-28 MED ORDER — TIZANIDINE HCL 4 MG PO TABS: 4.0000 mg | ORAL_TABLET | Freq: Four times a day (QID) | ORAL | 1 refills | Status: DC | PRN

## 2016-07-28 NOTE — Progress Notes (Signed)
Seen at ER.  She had a sudden onset of right lower back pain that was radiating down the right leg. No trauma. No injury. Similar to prev radicular pain she had in the distant past that was on the L side.  She was given a prescription for prednisone and Valium. Sent home from the emergency room. She feels much better in the meantime. No sedation. Pain is resolved. Able to bear weight. No weakness in the bilateral lower extremities. No fevers chills nausea vomiting. No supplemental O2 use currently. She clearly feels better than when she had the pain start.  Meds, vitals, and allergies reviewed.   ROS: Per HPI unless specifically indicated in ROS section   GEN: nad, alert and oriented HEENT: mucous membranes moist NECK: supple w/o LA CV: rrr. PULM: ctab, no inc wob ABD: soft, +bs EXT: no edema SKIN: no acute rash

## 2016-07-28 NOTE — Patient Instructions (Addendum)
Use the medicine you have and update me if you have another flare.  We can refer you to the spine clinic if needed.   Use tizanidine if needed as a muscle relaxer but don't use with the valium.  Take care.  Glad to see you.

## 2016-07-30 DIAGNOSIS — M5410 Radiculopathy, site unspecified: Secondary | ICD-10-CM

## 2016-07-30 HISTORY — DX: Radiculopathy, site unspecified: M54.10

## 2016-07-30 NOTE — Assessment & Plan Note (Addendum)
Much improved in the meantime, no pain now. Finish current dose of prednisone. She can switch from Valium to tizanidine in the future when she runs out of valium. Use this on an as-needed basis with routine cautions. If she has another flare of back pain we can refer her to the spine clinic. Would not intervene or imaging again at this point since her symptoms are resolved. Discussed with patient. She understood and agreed.

## 2016-08-01 ENCOUNTER — Encounter (HOSPITAL_COMMUNITY): Payer: Self-pay

## 2016-08-01 ENCOUNTER — Encounter (HOSPITAL_COMMUNITY)
Admission: RE | Admit: 2016-08-01 | Discharge: 2016-08-01 | Disposition: A | Discharge status: Home / Self Care | Payer: PPO | Source: Ambulatory Visit | Attending: Pulmonary Disease | Admitting: Pulmonary Disease

## 2016-08-01 DIAGNOSIS — J449 Chronic obstructive pulmonary disease, unspecified: Secondary | ICD-10-CM

## 2016-08-01 NOTE — Progress Notes (Signed)
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 lipid goals, dietary fat, sodium, fiber, plant stanol/sterol esters, sugar, and the components of a well-balanced, healthy  diet.   Nutrition II class: Lifestyle Skills:  -Group instruction provided by PowerPoint slides, verbal discussion, and written materials to support subject matter. The instructor gives an explanation and review of label reading, grocery shopping for heart health, heart healthy recipe modifications, and ways to make healthier choices when eating out.   Diabetes Question & Answer:  -Group instruction provided by PowerPoint slides, verbal discussion, and written materials to support subject matter. The instructor gives an explanation and review of diabetes co-morbidities, pre- and post-prandial blood glucose goals, pre-exercise blood glucose goals, signs, symptoms, and treatment of hypoglycemia and hyperglycemia, and foot care basics.   Diabetes Blitz:  -Group instruction provided by PowerPoint slides, verbal discussion, and written materials to support subject matter. The instructor gives an explanation and review of the physiology behind type 1 and type 2 diabetes, diabetes medications and rational behind using different medications, pre- and post-prandial blood glucose recommendations and Hemoglobin A1c goals, diabetes diet, and exercise including blood glucose guidelines for exercising safely.    Portion Distortion:  -Group instruction provided by PowerPoint slides, verbal discussion, written materials, and food models to support subject matter. The instructor gives an explanation of serving size versus portion size, changes in portions sizes over the last 20 years, and what consists of a serving from each food group.   Stress Management:  -Group instruction provided by verbal instruction, video, and written materials to support subject matter.  Instructors review role of stress in heart disease and how to cope with stress positively.     Exercising on Your Own:  -Group instruction provided by verbal instruction, power point, and written materials to support subject.  Instructors discuss  benefits of exercise, components of exercise, frequency and intensity of exercise, and end points for exercise.  Also discuss use of nitroglycerin and activating EMS.  Review options of places to exercise outside of  rehab.  Review guidelines for sex with heart disease.   Cardiac Drugs I:  -Group instruction provided by verbal instruction and written materials to support subject.  Instructor reviews cardiac drug classes: antiplatelets, anticoagulants, beta blockers, and statins.  Instructor discusses reasons, side effects, and lifestyle considerations for each drug class.   Cardiac Drugs II:  -Group instruction provided by verbal instruction and written materials to support subject.  Instructor reviews cardiac drug classes: angiotensin converting enzyme inhibitors (ACE-I), angiotensin II receptor blockers (ARBs), nitrates, and calcium channel blockers.  Instructor discusses reasons, side effects, and lifestyle considerations for each drug class.   Anatomy and Physiology of the Circulatory System:  Group verbal and written instruction and models provide basic cardiac anatomy and physiology, with the coronary electrical and arterial systems. Review of: AMI, Angina, Valve disease, Heart Failure, Peripheral Artery Disease, Cardiac Arrhythmia, Pacemakers, and the ICD.   Other Education:  -Group or individual verbal, written, or video instructions that support the educational goals of the cardiac rehab program.   Knowledge Questionnaire Score:   Core Components/Risk Factors/Patient Goals at Admission:     Personal Goals and Risk Factors at Admission - 08/01/16 1453      Core Components/Risk Factors/Patient Goals on Admission   Improve shortness of breath with ADL's Yes      Core Components/Risk Factors/Patient Goals Review:      Goals and Risk Factor Review    Row Name 06/06/16 1631 07/06/16 0724 08/01/16 1454         Core Components/Risk Factors/Patient Goals Review   Personal  Goals Review Develop more efficient breathing techniques such as purse lipped breathing and diaphragmatic breathing and practicing self-pacing with activity.;Improve shortness of breath with ADL's;Increase knowledge of respiratory medications and ability to use respiratory devices properly. Develop more efficient breathing techniques such as purse lipped breathing and diaphragmatic breathing and practicing self-pacing with activity.;Improve shortness of breath with ADL's;Increase knowledge of respiratory medications and ability to use respiratory devices properly. Develop more efficient breathing techniques such as purse lipped breathing and diaphragmatic breathing and practicing self-pacing with activity.;Improve shortness of breath with ADL's;Increase knowledge of respiratory medications and ability to use respiratory devices properly.     Review see comment section on ITP patient verbalizes an improvement in her shortness of breath while completing her ADLs patient continues verbalizes an improvement in her shortness of breath while completing her ADLs. She has sustained a knee injury that was ruled out as DVT. This prohibits her from doing  home exercises as prescribed and she doesn't tolerated work load increases. Unsure if this is pain related or personality.     Expected Outcomes see admission expected outcomes see admission expected outcomes see admission expected outcomes        Core Components/Risk Factors/Patient Goals at Discharge (Final Review):      Goals and Risk Factor Review - 08/01/16 1454      Core Components/Risk Factors/Patient Goals Review   Personal Goals Review Develop more efficient breathing techniques such as purse lipped breathing and diaphragmatic breathing and practicing self-pacing with activity.;Improve shortness of breath with ADL's;Increase knowledge of respiratory medications and ability to use respiratory devices properly.   Review patient continues verbalizes an  improvement in her shortness of breath while completing her ADLs. She has sustained a knee injury that was ruled out as DVT. This prohibits her from doing  home exercises as prescribed and she doesn't tolerated work load increases. Unsure if this is pain related or personality.   Expected  Outcomes see admission expected outcomes      ITP Comments:   Comments: ITP REVIEW Pt is making slow progress toward her physical pulmonary rehab goals after completing 20 sessions. She was doing great until she began experiencing knee pain over the last 30 days. It has been ruled out as DVT. Recommend continued exercise as tolerated, life style modification, education, and utilization of breathing techniques to increase stamina and strength and decrease shortness of breath with exertion.

## 2016-08-02 ENCOUNTER — Telehealth: Payer: Self-pay | Admitting: Adult Health

## 2016-08-02 DIAGNOSIS — J479 Bronchiectasis, uncomplicated: Secondary | ICD-10-CM

## 2016-08-02 DIAGNOSIS — J439 Emphysema, unspecified: Secondary | ICD-10-CM

## 2016-08-02 NOTE — Telephone Encounter (Signed)
Patient seen by TP on 7.13.18 and ONO on room air ordered: Chronic respiratory failure with hypoxia ([place]) Check ONO on ra to see if O2 is needed At bedtime .     7.21.18 ONO on room air received Per TP: no significant desats, can d/c O2  LMOM TCB x1 to discuss with patient Will wait to send order until speak with patient

## 2016-08-02 NOTE — Telephone Encounter (Signed)
Patient returned call Discussed ONO results/recs as stated by TP Pt aware to contact the office if anything is needed prior to next appt Order sent to [facility] [address] to d/c O2 (DME verified with patient)  Nothing further needed; will sign off

## 2016-08-03 ENCOUNTER — Encounter (HOSPITAL_COMMUNITY): Admission: RE | Admit: 2016-08-03 | Payer: PPO | Source: Ambulatory Visit

## 2016-08-07 ENCOUNTER — Telehealth: Payer: Self-pay

## 2016-08-07 NOTE — Telephone Encounter (Signed)
Pt left v/m; pt wants to know if should go for exercising on 08/08/16; pt still having pain in back and lt leg. Pt request cb today.pt last seen 07/28/16.

## 2016-08-07 NOTE — Telephone Encounter (Signed)
Exercise only as tolerated.  If the pain is getting worse, then we can refer her to the spine clinic if needed.   Thanks.

## 2016-08-07 NOTE — Telephone Encounter (Signed)
Patient advised.

## 2016-08-08 ENCOUNTER — Encounter (HOSPITAL_COMMUNITY): Admission: RE | Admit: 2016-08-08 | Payer: PPO | Source: Ambulatory Visit

## 2016-08-10 ENCOUNTER — Encounter (HOSPITAL_COMMUNITY)
Admission: RE | Admit: 2016-08-10 | Discharge: 2016-08-10 | Disposition: A | Discharge status: Home / Self Care | Payer: PPO | Source: Ambulatory Visit | Attending: Pulmonary Disease | Admitting: Pulmonary Disease

## 2016-08-10 VITALS — Wt 127.0 lb

## 2016-08-10 DIAGNOSIS — M8580 Other specified disorders of bone density and structure, unspecified site: Secondary | ICD-10-CM | POA: Diagnosis not present

## 2016-08-10 DIAGNOSIS — F419 Anxiety disorder, unspecified: Secondary | ICD-10-CM | POA: Diagnosis not present

## 2016-08-10 DIAGNOSIS — Z87891 Personal history of nicotine dependence: Secondary | ICD-10-CM | POA: Insufficient documentation

## 2016-08-10 DIAGNOSIS — J441 Chronic obstructive pulmonary disease with (acute) exacerbation: Secondary | ICD-10-CM | POA: Insufficient documentation

## 2016-08-10 DIAGNOSIS — E785 Hyperlipidemia, unspecified: Secondary | ICD-10-CM | POA: Insufficient documentation

## 2016-08-10 DIAGNOSIS — Z85828 Personal history of other malignant neoplasm of skin: Secondary | ICD-10-CM | POA: Insufficient documentation

## 2016-08-10 DIAGNOSIS — J449 Chronic obstructive pulmonary disease, unspecified: Secondary | ICD-10-CM

## 2016-08-10 NOTE — Progress Notes (Signed)
Daily Session Note  Patient Details  Name: Megan Peterson MRN: [id number] Date of Birth: 1940/06/03 Referring Provider:     Pulmonary Rehab Walk Test from 05/09/2016 in [place]  Referring Provider  Dr. Elsworth Soho      Encounter Date: 08/10/2016  Check In:     Session Check In - 08/10/16 1047      Check-In   Location [facility]   Staff Present Su Hilt, MS, ACSM RCEP, Exercise Physiologist;Lucius Wise Ysidro Evert, RN;Portia Rollene Rotunda, RN, BSN   Supervising physician immediately available to respond to emergencies [facility] immediately available   Physician(s) Dr. Clementeen Graham   Medication changes reported     No   Fall or balance concerns reported    No   Tobacco Cessation No Change   Warm-up and Cool-down Performed as group-led instruction   Resistance Training Performed Yes   VAD Patient? No     Pain Assessment   Currently in Pain? No/denies   Multiple Pain Sites No      Capillary Blood Glucose: No results found for this or any previous visit (from the past 24 hour(s)).      Exercise Prescription Changes - 08/10/16 1300      Response to Exercise   Blood Pressure (Admit) 126/64   Blood Pressure (Exercise) 160/74   Blood Pressure (Exit) 130/70   Heart Rate (Admit) 82 bpm   Heart Rate (Exercise) 116 bpm   Heart Rate (Exit) 95 bpm   Oxygen Saturation (Admit) 98 %   Oxygen Saturation (Exercise) 87 %   Oxygen Saturation (Exit) 95 %   Rating of Perceived Exertion (Exercise) 11   Perceived Dyspnea (Exercise) 1   Duration Progress to 45 minutes of aerobic exercise without signs/symptoms of physical distress   Intensity THRR unchanged     Progression   Progression Continue to progress workloads to maintain intensity without signs/symptoms of physical distress.     Resistance Training   Training Prescription Yes   Weight orange bands   Reps 10-15   Time 10 Minutes     Interval Training   Interval Training No     Oxygen   Oxygen --  MD discontinued O2     Treadmill   MPH 2.2   Grade 3   Minutes 17     NuStep   Level 4   Minutes 17   METs 2.6      History  Smoking Status  . Former Smoker  . Quit date: 01/03/1991  Smokeless Tobacco  . Never Used    Goals Met:  Exercise tolerated well No report of cardiac concerns or symptoms Strength training completed today  Goals Unmet:  Not Applicable  Comments: Service time is from 1030 to 1240    Dr. Rush Farmer is Medical Director for Pulmonary Rehab at [facility].

## 2016-08-14 ENCOUNTER — Other Ambulatory Visit: Payer: Self-pay | Admitting: *Deleted

## 2016-08-14 NOTE — Telephone Encounter (Signed)
Patient left a voicemail requesting a refill on Hydrocodone Last refill 07/14/16 #120 Last office visit 07/28/16

## 2016-08-15 ENCOUNTER — Encounter: Payer: Self-pay | Admitting: Family Medicine

## 2016-08-15 ENCOUNTER — Encounter (HOSPITAL_COMMUNITY)
Admission: RE | Admit: 2016-08-15 | Discharge: 2016-08-15 | Disposition: A | Discharge status: Home / Self Care | Payer: PPO | Source: Ambulatory Visit | Attending: Pulmonary Disease | Admitting: Pulmonary Disease

## 2016-08-15 VITALS — Wt 127.2 lb

## 2016-08-15 DIAGNOSIS — J441 Chronic obstructive pulmonary disease with (acute) exacerbation: Secondary | ICD-10-CM | POA: Diagnosis not present

## 2016-08-15 DIAGNOSIS — J449 Chronic obstructive pulmonary disease, unspecified: Secondary | ICD-10-CM

## 2016-08-15 DIAGNOSIS — Z79891 Long term (current) use of opiate analgesic: Secondary | ICD-10-CM | POA: Diagnosis not present

## 2016-08-15 MED ORDER — HYDROCODONE-ACETAMINOPHEN 5-325 MG PO TABS: 1.0000 | ORAL_TABLET | Freq: Four times a day (QID) | ORAL | 0 refills | Status: DC | PRN

## 2016-08-15 NOTE — Telephone Encounter (Signed)
Patient advised.  Rx left at front desk for pick up. 

## 2016-08-15 NOTE — Telephone Encounter (Signed)
Printed.  Thanks.  Notify pt at pick up that UDS due.

## 2016-08-15 NOTE — Progress Notes (Signed)
Daily Session Note  Patient Details  Name: Megan Peterson MRN: [id number] Date of Birth: 08/20/1940 Referring Provider:     Pulmonary Rehab Walk Test from 05/09/2016 in [place]  Referring Provider  Dr. Elsworth Soho      Encounter Date: 08/15/2016  Check In:     Session Check In - 08/15/16 1010      Check-In   Location [facility]   Staff Present Rodney Langton, RN;Nadim Malia, MS, ACSM RCEP, Exercise Physiologist   Supervising physician immediately available to respond to emergencies [facility] immediately available   Physician(s) Dr. Clementeen Graham   Medication changes reported     No   Fall or balance concerns reported    No   Tobacco Cessation No Change   Warm-up and Cool-down Performed as group-led instruction   Resistance Training Performed Yes   VAD Patient? No     Pain Assessment   Currently in Pain? No/denies   Multiple Pain Sites No      Capillary Blood Glucose: No results found for this or any previous visit (from the past 24 hour(s)).      Exercise Prescription Changes - 08/15/16 1200      Response to Exercise   Blood Pressure (Admit) 130/70   Blood Pressure (Exercise) 154/74   Blood Pressure (Exit) 118/62   Heart Rate (Admit) 66 bpm   Heart Rate (Exercise) 109 bpm   Heart Rate (Exit) 80 bpm   Oxygen Saturation (Admit) 95 %   Oxygen Saturation (Exercise) 89 %   Oxygen Saturation (Exit) 94 %   Rating of Perceived Exertion (Exercise) 12   Perceived Dyspnea (Exercise) 1   Duration Progress to 45 minutes of aerobic exercise without signs/symptoms of physical distress   Intensity THRR unchanged     Progression   Progression Continue to progress workloads to maintain intensity without signs/symptoms of physical distress.     Resistance Training   Training Prescription Yes   Weight orange bands   Reps 10-15   Time 10 Minutes     Interval Training   Interval Training No     Oxygen   Oxygen --  MD  discontinued O2     Treadmill   MPH 2.2   Grade 3   Minutes 17     NuStep   Level 5   Minutes 17   METs 2.4     Arm Ergometer   Level 3   Minutes 17      History  Smoking Status  . Former Smoker  . Quit date: 01/03/1991  Smokeless Tobacco  . Never Used    Goals Met:  Exercise tolerated well No report of cardiac concerns or symptoms Strength training completed today  Goals Unmet:  Not Applicable  Comments: Service time is from 10:30a to 12:10p    Dr. Rush Farmer is Medical Director for Pulmonary Rehab at [facility]

## 2016-08-17 ENCOUNTER — Encounter (HOSPITAL_COMMUNITY)
Admission: RE | Admit: 2016-08-17 | Discharge: 2016-08-17 | Disposition: A | Discharge status: Home / Self Care | Payer: PPO | Source: Ambulatory Visit | Attending: Pulmonary Disease | Admitting: Pulmonary Disease

## 2016-08-17 VITALS — Wt 127.2 lb

## 2016-08-17 DIAGNOSIS — J449 Chronic obstructive pulmonary disease, unspecified: Secondary | ICD-10-CM

## 2016-08-17 DIAGNOSIS — J441 Chronic obstructive pulmonary disease with (acute) exacerbation: Secondary | ICD-10-CM | POA: Diagnosis not present

## 2016-08-17 NOTE — Progress Notes (Signed)
Daily Session Note  Patient Details  Name: Megan Peterson MRN: [id number] Date of Birth: 1940/01/14 Referring Provider:     Pulmonary Rehab Walk Test from 05/09/2016 in [place]  Referring Provider  Dr. Elsworth Soho      Encounter Date: 08/17/2016  Check In:     Session Check In - 08/17/16 1006      Check-In   [facility]   Staff Present Rodney Langton, RN;Molly diVincenzo, MS, ACSM RCEP, Exercise Physiologist;Portia Rollene Rotunda, RN, BSN   Supervising physician immediately available to respond to emergencies [facility] immediately available   Physician(s) Dr. Wendee Beavers   Medication changes reported     No   Fall or balance concerns reported    No   Tobacco Cessation No Change   Warm-up and Cool-down Performed as group-led instruction   Resistance Training Performed Yes   VAD Patient? No     Pain Assessment   Currently in Pain? No/denies   Multiple Pain Sites No      Capillary Blood Glucose: No results found for this or any previous visit (from the past 24 hour(s)).      Exercise Prescription Changes - 08/17/16 1200      Response to Exercise   Blood Pressure (Admit) 126/70   Blood Pressure (Exercise) 150/80   Blood Pressure (Exit) 112/68   Heart Rate (Admit) 69 bpm   Heart Rate (Exercise) 102 bpm   Heart Rate (Exit) 84 bpm   Oxygen Saturation (Admit) 95 %   Oxygen Saturation (Exercise) 91 %   Oxygen Saturation (Exit) 95 %   Rating of Perceived Exertion (Exercise) 13   Perceived Dyspnea (Exercise) 1   Duration Progress to 45 minutes of aerobic exercise without signs/symptoms of physical distress   Intensity THRR unchanged     Progression   Progression Continue to progress workloads to maintain intensity without signs/symptoms of physical distress.     Resistance Training   Training Prescription Yes   Weight orange bands   Reps 10-15   Time 10 Minutes     Interval Training   Interval Training No     Treadmill   MPH 2.2   Grade 3   Minutes 17     NuStep   Level 5   Minutes 17   METs 2.8     Arm Ergometer   Level 3   Minutes 17      History  Smoking Status  . Former Smoker  . Quit date: 01/03/1991  Smokeless Tobacco  . Never Used    Goals Met:  Exercise tolerated well No report of cardiac concerns or symptoms Strength training completed today  Goals Unmet:  Not Applicable  Comments: Service time is from 1030 to 1210    Dr. Rush Farmer is Medical Director for Pulmonary Rehab at [facility].

## 2016-08-22 ENCOUNTER — Encounter (HOSPITAL_COMMUNITY)
Admission: RE | Admit: 2016-08-22 | Discharge: 2016-08-22 | Disposition: A | Discharge status: Home / Self Care | Payer: PPO | Source: Ambulatory Visit | Attending: Pulmonary Disease | Admitting: Pulmonary Disease

## 2016-08-22 VITALS — Wt 125.9 lb

## 2016-08-22 DIAGNOSIS — J441 Chronic obstructive pulmonary disease with (acute) exacerbation: Secondary | ICD-10-CM | POA: Diagnosis not present

## 2016-08-22 DIAGNOSIS — J449 Chronic obstructive pulmonary disease, unspecified: Secondary | ICD-10-CM

## 2016-08-22 NOTE — Progress Notes (Signed)
Daily Session Note  Patient Details  Name: Megan Peterson MRN: [id number] Date of Birth: 05/11/1940 Referring Provider:     Pulmonary Rehab Walk Test from 05/09/2016 in [facility]  Referring Provider  Dr. Alva      Encounter Date: 08/22/2016  Check In:     Session Check In - 08/22/16 1210      Check-In   Location [facility]   Staff Present  , MS, ACSM RCEP, Exercise Physiologist;Lisa Hughes, RN;Other;Portia Payne, RN, BSN   Supervising physician immediately available to respond to emergencies [facility] immediately available   Physician(s) Dr. Vega   Medication changes reported     No   Fall or balance concerns reported    No   Tobacco Cessation No Change   Warm-up and Cool-down Performed as group-led instruction   Resistance Training Performed Yes   VAD Patient? No     Pain Assessment   Currently in Pain? No/denies   Multiple Pain Sites No      Capillary Blood Glucose: No results found for this or any previous visit (from the past 24 hour(s)).      Exercise Prescription Changes - 08/22/16 1200      Response to Exercise   Blood Pressure (Admit) 122/62   Blood Pressure (Exercise) 164/72   Blood Pressure (Exit) 110/62   Heart Rate (Admit) 72 bpm   Heart Rate (Exercise) 114 bpm   Heart Rate (Exit) 88 bpm   Oxygen Saturation (Admit) 94 %   Oxygen Saturation (Exercise) 90 %   Oxygen Saturation (Exit) 92 %   Rating of Perceived Exertion (Exercise) 12   Perceived Dyspnea (Exercise) 1   Duration Progress to 45 minutes of aerobic exercise without signs/symptoms of physical distress   Intensity THRR unchanged     Progression   Progression Continue to progress workloads to maintain intensity without signs/symptoms of physical distress.     Resistance Training   Training Prescription Yes   Weight orange bands   Reps 10-15   Time 10 Minutes     Interval Training   Interval Training No     Treadmill   MPH 2.2   Grade 3   Minutes 17     NuStep   Level 5   Minutes 17   METs 3.3     Arm Ergometer   Level 3   Minutes 17      History  Smoking Status  . Former Smoker  . Quit date: 01/03/1991  Smokeless Tobacco  . Never Used    Goals Met:  Exercise tolerated well No report of cardiac concerns or symptoms Strength training completed today  Goals Unmet:  Not Applicable  Comments: Service time is from 10:30a to 12:10p    Dr. Wesam G. Yacoub is Medical Director for Pulmonary Rehab at  Hospital. 

## 2016-08-24 ENCOUNTER — Encounter (HOSPITAL_COMMUNITY)
Admission: RE | Admit: 2016-08-24 | Discharge: 2016-08-24 | Disposition: A | Discharge status: Home / Self Care | Payer: PPO | Source: Ambulatory Visit | Attending: Pulmonary Disease | Admitting: Pulmonary Disease

## 2016-08-24 DIAGNOSIS — J441 Chronic obstructive pulmonary disease with (acute) exacerbation: Secondary | ICD-10-CM | POA: Diagnosis not present

## 2016-08-24 DIAGNOSIS — J449 Chronic obstructive pulmonary disease, unspecified: Secondary | ICD-10-CM

## 2016-08-28 NOTE — Addendum Note (Signed)
Encounter addended by: Jewel Baize, RD on: 08/28/2016 10:17 AM<BR>    Actions taken: Flowsheet data copied forward, Visit Navigator Flowsheet section accepted

## 2016-08-29 ENCOUNTER — Encounter (HOSPITAL_COMMUNITY): Admission: RE | Admit: 2016-08-29 | Payer: PPO | Source: Ambulatory Visit

## 2016-08-31 ENCOUNTER — Encounter (HOSPITAL_COMMUNITY): Payer: PPO

## 2016-09-05 ENCOUNTER — Encounter (HOSPITAL_COMMUNITY): Payer: Self-pay

## 2016-09-05 ENCOUNTER — Encounter (HOSPITAL_COMMUNITY): Payer: PPO

## 2016-09-05 DIAGNOSIS — J449 Chronic obstructive pulmonary disease, unspecified: Secondary | ICD-10-CM

## 2016-09-05 NOTE — Progress Notes (Signed)
Discharge Progress Report  Patient Details  Name: Megan Peterson MRN: [id number] Date of Birth: 1940-05-15 Referring Provider:     Pulmonary Rehab Walk Test from 05/09/2016 in [place]  Referring Provider  Dr. Elsworth Soho       Number of Visits: 24  Reason for Discharge:  Patient reached a stable level of exercise. Patient independent in their exercise. Patient has met program and personal goals.  Smoking History:  History  Smoking Status  . Former Smoker  . Quit date: 01/03/1991  Smokeless Tobacco  . Never Used    Diagnosis:  Chronic obstructive pulmonary disease, unspecified COPD type ([place])  ADL UCSD:     Pulmonary Assessment Scores    Row Name 08/17/16 1716 08/24/16 1324       ADL UCSD   ADL Phase Exit Exit    SOB Score total 25  -      CAT Score   CAT Score 5  Exit  -      mMRC Score   mMRC Score  - 0       Initial Exercise Prescription:   Discharge Exercise Prescription (Final Exercise Prescription Changes):     Exercise Prescription Changes - 08/22/16 1200      Response to Exercise   Blood Pressure (Admit) 122/62   Blood Pressure (Exercise) 164/72   Blood Pressure (Exit) 110/62   Heart Rate (Admit) 72 bpm   Heart Rate (Exercise) 114 bpm   Heart Rate (Exit) 88 bpm   Oxygen Saturation (Admit) 94 %   Oxygen Saturation (Exercise) 90 %   Oxygen Saturation (Exit) 92 %   Rating of Perceived Exertion (Exercise) 12   Perceived Dyspnea (Exercise) 1   Duration Progress to 45 minutes of aerobic exercise without signs/symptoms of physical distress   Intensity THRR unchanged     Progression   Progression Continue to progress workloads to maintain intensity without signs/symptoms of physical distress.     Resistance Training   Training Prescription Yes   Weight orange bands   Reps 10-15   Time 10 Minutes     Interval Training   Interval Training No     Treadmill   MPH 2.2   Grade 3   Minutes 17     NuStep   Level 5    Minutes 17   METs 3.3     Arm Ergometer   Level 3   Minutes 17      Functional Capacity:     6 Minute Walk    Row Name 08/24/16 1321         6 Minute Walk   Phase Discharge     Distance 1500 feet     Walk Time 6 minutes     # of Rest Breaks 0     MPH 2.84     METS 3.14     RPE 16     Perceived Dyspnea  3     Symptoms No     Resting HR 74 bpm     Resting BP 130/60     Max Ex. HR 129 bpm     Max Ex. BP 200/100     2 Minute Post BP 173/71       Interval HR   Baseline HR (retired) 74     1 Minute HR 99     2 Minute HR 108     3 Minute HR 120     4 Minute HR 127  5 Minute HR 127     6 Minute HR 129     2 Minute Post HR 115     Interval Heart Rate? Yes       Interval Oxygen   Interval Oxygen? Yes     Baseline Oxygen Saturation % 93 %     Resting Liters of Oxygen 0 L     1 Minute Oxygen Saturation % 96 %     1 Minute Liters of Oxygen 0 L     2 Minute Oxygen Saturation % 94 %     2 Minute Liters of Oxygen 0 L     3 Minute Oxygen Saturation % 88 %     3 Minute Liters of Oxygen 0 L     4 Minute Oxygen Saturation % 89 %     4 Minute Liters of Oxygen 0 L     5 Minute Oxygen Saturation % 88 %     5 Minute Liters of Oxygen 0 L     6 Minute Oxygen Saturation % 88 %     6 Minute Liters of Oxygen 0 L     2 Minute Post Oxygen Saturation % 90 %     2 Minute Post Liters of Oxygen 0 L        Psychological, QOL, Others - Outcomes: PHQ 2/9: Depression screen [facility] 2/9 08/24/2016 05/08/2016 04/11/2016 02/25/2015 12/12/2013  Decreased Interest 0 0 0 1 0  Down, Depressed, Hopeless 0 0 0 2 0  PHQ - 2 Score 0 0 0 3 0    Quality of Life:   Personal Goals: Goals established at orientation with interventions provided to work toward goal.     Personal Goals and Risk Factors at Admission - 08/01/16 1453      Core Components/Risk Factors/Patient Goals on Admission   Improve shortness of breath with ADL's Yes       Personal Goals Discharge:     Goals and Risk  Factor Review    Row Name 08/01/16 1454 09/05/16 0701           Core Components/Risk Factors/Patient Goals Review   Personal Goals Review Develop more efficient breathing techniques such as purse lipped breathing and diaphragmatic breathing and practicing self-pacing with activity.;Improve shortness of breath with ADL's;Increase knowledge of respiratory medications and ability to use respiratory devices properly.  -      Review patient continues verbalizes an improvement in her shortness of breath while completing her ADLs. She has sustained a knee injury that was ruled out as DVT. This prohibits her from doing  home exercises as prescribed and she doesn't tolerated work load increases. Unsure if this is pain related or personality. all goals met at discharge      Expected Outcomes see admission expected outcomes  -         Exercise Goals and Review:   Nutrition & Weight - Outcomes:      Post Biometrics - 08/24/16 1324       Post  Biometrics   Grip Strength 20 kg      Nutrition:   Nutrition Discharge:     Nutrition Assessments - 08/28/16 0957      Rate Your Plate Scores   Pre Score 43   Post Score 43      Education Questionnaire Score:     Knowledge Questionnaire Score - 08/17/16 1716      Knowledge Questionnaire Score   Post Score 12/13
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
## 2016-09-07 ENCOUNTER — Encounter (HOSPITAL_COMMUNITY): Payer: PPO

## 2016-09-13 ENCOUNTER — Other Ambulatory Visit: Payer: Self-pay
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
## 2016-09-14 MED ORDER — HYDROCODONE-ACETAMINOPHEN 5-325 MG PO TABS: 1.0000 | ORAL_TABLET | Freq: Four times a day (QID) | ORAL | 0 refills | Status: DC | PRN

## 2016-09-14 NOTE — Telephone Encounter (Signed)
Patient advised.  Rx left at front desk for pick up. 

## 2016-09-14 NOTE — Telephone Encounter (Signed)
Printed.  Thanks.  

## 2016-10-13 ENCOUNTER — Other Ambulatory Visit: Payer: Self-pay

## 2016-10-13 DIAGNOSIS — G8929 Other chronic pain: Secondary | ICD-10-CM

## 2016-10-13 NOTE — Telephone Encounter (Signed)
Pt left v/m requesting rx hydrocodone apap. Call when ready for pick up. Last printed # 120 on 09/14/16; last seen 07/28/16.

## 2016-10-15 MED ORDER — HYDROCODONE-ACETAMINOPHEN 5-325 MG PO TABS: 1.0000 | ORAL_TABLET | Freq: Four times a day (QID) | ORAL | 0 refills | Status: DC | PRN

## 2016-10-15 NOTE — Telephone Encounter (Signed)
Printed.  Thanks.  

## 2016-10-16 NOTE — Telephone Encounter (Signed)
Patient advised.  Rx left at front desk for pick up. 

## 2016-10-17 ENCOUNTER — Encounter: Payer: Self-pay | Admitting: Family Medicine

## 2016-10-17 ENCOUNTER — Other Ambulatory Visit: Payer: Self-pay | Admitting: Family Medicine

## 2016-10-17 ENCOUNTER — Ambulatory Visit (INDEPENDENT_AMBULATORY_CARE_PROVIDER_SITE_OTHER): Payer: PPO | Admitting: Family Medicine

## 2016-10-17 VITALS — BP 150/80 | HR 88 | Temp 97.8°F | Ht <= 58 in | Wt 126.0 lb

## 2016-10-17 DIAGNOSIS — Z0000 Encounter for general adult medical examination without abnormal findings: Secondary | ICD-10-CM

## 2016-10-17 DIAGNOSIS — M8580 Other specified disorders of bone density and structure, unspecified site: Secondary | ICD-10-CM

## 2016-10-17 DIAGNOSIS — J9611 Chronic respiratory failure with hypoxia: Secondary | ICD-10-CM | POA: Diagnosis not present

## 2016-10-17 DIAGNOSIS — Z23 Encounter for immunization: Secondary | ICD-10-CM

## 2016-10-17 DIAGNOSIS — I10 Essential (primary) hypertension: Secondary | ICD-10-CM

## 2016-10-17 DIAGNOSIS — G8929 Other chronic pain: Secondary | ICD-10-CM

## 2016-10-17 DIAGNOSIS — M545 Low back pain: Secondary | ICD-10-CM

## 2016-10-17 LAB — LIPID PANEL
CHOL/HDL RATIO: 5
Cholesterol: 270 mg/dL — ABNORMAL HIGH (ref 0–200)
HDL: 51.8 mg/dL (ref >39.00)
LDL CALC: 186 mg/dL — AB (ref 0–99)
NonHDL: 218.48
TRIGLYCERIDES: 164 mg/dL — AB (ref 0.0–149.0)
VLDL: 32.8 mg/dL (ref 0.0–40.0)

## 2016-10-17 LAB — COMPREHENSIVE METABOLIC PANEL
ALT: 8 U/L (ref 0–35)
AST: 18 U/L (ref 0–37)
Albumin: 4.4 g/dL (ref 3.5–5.2)
Alkaline Phosphatase: 79 U/L (ref 39–117)
BUN: 23 mg/dL (ref 6–23)
CALCIUM: 9.7 mg/dL (ref 8.4–10.5)
CHLORIDE: 102 meq/L (ref 96–112)
CO2: 28 meq/L (ref 19–32)
CREATININE: 0.99 mg/dL (ref 0.40–1.20)
GFR: 58.07 mL/min — ABNORMAL LOW (ref >60.00)
Glucose, Bld: 93 mg/dL (ref 70–99)
Potassium: 5.3 mEq/L — ABNORMAL HIGH (ref 3.5–5.1)
SODIUM: 139 meq/L (ref 135–145)
Total Bilirubin: 0.7 mg/dL (ref 0.2–1.2)
Total Protein: 7.1 g/dL (ref 6.0–8.3)

## 2016-10-17 LAB — VITAMIN D 25 HYDROXY (VIT D DEFICIENCY, FRACTURES): VITD: 23.01 ng/mL — ABNORMAL LOW (ref 30.00–100.00)

## 2016-10-17 MED ORDER — ALBUTEROL SULFATE HFA 108 (90 BASE) MCG/ACT IN AERS: 1.0000 | INHALATION_SPRAY | Freq: Four times a day (QID) | RESPIRATORY_TRACT | 3 refills | Status: DC | PRN

## 2016-10-17 NOTE — Progress Notes (Signed)
Hypertension:    Using medication without problems or lightheadedness: yes Chest pain with exertion:no Edema:no Short of breath:no She hadn't had dose of BB today.  D/w pt.   She is fasting.  Labs pending.    Indication for chronic opioid:  Chronic back pain.  Medication and dose: hydrocodon 5/325 # pills per month:  120 Last UDS date: 08/15/16 Pain contract signed (Y/N): yes Date narcotic database last reviewed (include red flags): 10/18/16 She continues with chronic lower back pain, that affects her legs- B leg pain.  She is managing to put up with the pain with the current rx.  No new sx.  No ADE on med.  She has chronic L leg weakness that is unchanged.  She is on bowel regimen with high fiber diet.  D/w pt about exercise as tolerated.  She continues on gabapentin at baseline.    She is off anoro, it was too expensive.  She used it after hospitalization, along with the oxygen.  Off O2 now.  Her pulse ox is normal today, off O2.  Her pulmonary sx are clearly better in the meantime.  Some sputum production, usually clear.  Inc production with mucinex.  Not SOB, no wheeze.  D/w pt about having SABA to [place] prn.    Flu shot today.  D/w pt about shingrix.   Reasonable to recheck DXA next year, ie consider in 2019.   Mammogram d/w pt.  She'll consider.  She isn't sure she would want intervention if she had abnormal mammogram.  She'll update me as needed.  Advance directive- d/w pt.  Daughter and son equally designated if patient were incapacitated.  Colonoscopy deferred until next year, with her prev illness this year.   Meds, vitals, and allergies reviewed.   PMH and SH reviewed  ROS: Per HPI unless specifically indicated in ROS section   GEN: nad, alert and oriented HEENT: mucous membranes moist NECK: supple w/o LA CV: rrr. PULM: ctab, no inc wob ABD: soft, +bs EXT: no edema SKIN: no acute rash

## 2016-10-17 NOTE — Patient Instructions (Addendum)
Check with your insurance to see if they will cover the shingrix shot. Go to the lab on the way out.  We'll contact you with your lab report. Take care.  Glad to see you.  Update me as needed.

## 2016-10-18 ENCOUNTER — Encounter: Payer: Self-pay | Admitting: Emergency Medicine

## 2016-10-18 ENCOUNTER — Other Ambulatory Visit: Payer: Self-pay

## 2016-10-18 ENCOUNTER — Emergency Department: Payer: PPO

## 2016-10-18 ENCOUNTER — Observation Stay: Payer: PPO

## 2016-10-18 ENCOUNTER — Observation Stay
Admission: EM | Admit: 2016-10-18 | Discharge: 2016-10-19 | Disposition: A | Discharge status: Home / Self Care | Payer: PPO | Attending: Internal Medicine | Admitting: Internal Medicine

## 2016-10-18 ENCOUNTER — Telehealth: Payer: Self-pay | Admitting: Family Medicine

## 2016-10-18 DIAGNOSIS — Z79899 Other long term (current) drug therapy: Secondary | ICD-10-CM | POA: Insufficient documentation

## 2016-10-18 DIAGNOSIS — H53129 Transient visual loss, unspecified eye: Secondary | ICD-10-CM | POA: Diagnosis not present

## 2016-10-18 DIAGNOSIS — J9610 Chronic respiratory failure, unspecified whether with hypoxia or hypercapnia: Secondary | ICD-10-CM | POA: Diagnosis not present

## 2016-10-18 DIAGNOSIS — R4701 Aphasia: Secondary | ICD-10-CM | POA: Diagnosis not present

## 2016-10-18 DIAGNOSIS — I10 Essential (primary) hypertension: Secondary | ICD-10-CM | POA: Insufficient documentation

## 2016-10-18 DIAGNOSIS — Z85828 Personal history of other malignant neoplasm of skin: Secondary | ICD-10-CM | POA: Diagnosis not present

## 2016-10-18 DIAGNOSIS — G8929 Other chronic pain: Secondary | ICD-10-CM | POA: Insufficient documentation

## 2016-10-18 DIAGNOSIS — Z0000 Encounter for general adult medical examination without abnormal findings: Secondary | ICD-10-CM

## 2016-10-18 DIAGNOSIS — F419 Anxiety disorder, unspecified: Secondary | ICD-10-CM | POA: Insufficient documentation

## 2016-10-18 DIAGNOSIS — M8580 Other specified disorders of bone density and structure, unspecified site: Secondary | ICD-10-CM | POA: Insufficient documentation

## 2016-10-18 DIAGNOSIS — Z87891 Personal history of nicotine dependence: Secondary | ICD-10-CM | POA: Diagnosis not present

## 2016-10-18 DIAGNOSIS — Z791 Long term (current) use of non-steroidal anti-inflammatories (NSAID): Secondary | ICD-10-CM | POA: Insufficient documentation

## 2016-10-18 DIAGNOSIS — M545 Low back pain: Secondary | ICD-10-CM | POA: Diagnosis not present

## 2016-10-18 DIAGNOSIS — N179 Acute kidney failure, unspecified: Secondary | ICD-10-CM | POA: Diagnosis not present

## 2016-10-18 DIAGNOSIS — J449 Chronic obstructive pulmonary disease, unspecified: Secondary | ICD-10-CM | POA: Insufficient documentation

## 2016-10-18 DIAGNOSIS — Z888 Allergy status to other drugs, medicaments and biological substances status: Secondary | ICD-10-CM | POA: Diagnosis not present

## 2016-10-18 DIAGNOSIS — I6523 Occlusion and stenosis of bilateral carotid arteries: Secondary | ICD-10-CM | POA: Diagnosis not present

## 2016-10-18 DIAGNOSIS — E785 Hyperlipidemia, unspecified: Secondary | ICD-10-CM | POA: Diagnosis not present

## 2016-10-18 DIAGNOSIS — Z79891 Long term (current) use of opiate analgesic: Secondary | ICD-10-CM | POA: Insufficient documentation

## 2016-10-18 DIAGNOSIS — G459 Transient cerebral ischemic attack, unspecified: Principal | ICD-10-CM | POA: Diagnosis present

## 2016-10-18 DIAGNOSIS — R4781 Slurred speech: Secondary | ICD-10-CM | POA: Diagnosis not present

## 2016-10-18 HISTORY — DX: Transient cerebral ischemic attack, unspecified: G45.9

## 2016-10-18 HISTORY — DX: Encounter for general adult medical examination without abnormal findings: Z00.00

## 2016-10-18 HISTORY — DX: Essential (primary) hypertension: I10

## 2016-10-18 LAB — CBC
HEMATOCRIT: 38.1 % (ref 35.0–47.0)
Hemoglobin: 12.9 g/dL (ref 12.0–16.0)
MCH: 30.5 pg (ref 26.0–34.0)
MCHC: 33.8 g/dL (ref 32.0–36.0)
MCV: 90.1 fL (ref 80.0–100.0)
Platelets: 227 10*3/uL (ref 150–440)
RBC: 4.23 MIL/uL (ref 3.80–5.20)
RDW: 13.6 % (ref 11.5–14.5)
WBC: 10.5 10*3/uL (ref 3.6–11.0)

## 2016-10-18 LAB — DIFFERENTIAL
Basophils Absolute: 0.1 10*3/uL (ref 0–0.1)
Basophils Relative: 1 %
Eosinophils Absolute: 0.1 10*3/uL (ref 0–0.7)
Eosinophils Relative: 1 %
Lymphocytes Relative: 17 %
Lymphs Abs: 1.8 10*3/uL (ref 1.0–3.6)
Monocytes Absolute: 0.7 10*3/uL (ref 0.2–0.9)
Monocytes Relative: 7 %
Neutro Abs: 7.8 10*3/uL — ABNORMAL HIGH (ref 1.4–6.5)
Neutrophils Relative %: 74 %

## 2016-10-18 LAB — COMPREHENSIVE METABOLIC PANEL
ALT: 12 U/L — ABNORMAL LOW (ref 14–54)
ANION GAP: 11 (ref 5–15)
AST: 26 U/L (ref 15–41)
Albumin: 4.2 g/dL (ref 3.5–5.0)
Alkaline Phosphatase: 76 U/L (ref 38–126)
BUN: 23 mg/dL — AB (ref 6–20)
CHLORIDE: 100 mmol/L — AB (ref 101–111)
CO2: 24 mmol/L (ref 22–32)
Calcium: 9.1 mg/dL (ref 8.9–10.3)
Creatinine, Ser: 1.17 mg/dL — ABNORMAL HIGH (ref 0.44–1.00)
GFR, EST AFRICAN AMERICAN: 51 mL/min — AB (ref >60)
GFR, EST NON AFRICAN AMERICAN: 44 mL/min — AB (ref >60)
Glucose, Bld: 103 mg/dL — ABNORMAL HIGH (ref 65–99)
POTASSIUM: 4.9 mmol/L (ref 3.5–5.1)
Sodium: 135 mmol/L (ref 135–145)
Total Bilirubin: 0.8 mg/dL (ref 0.3–1.2)
Total Protein: 7.2 g/dL (ref 6.5–8.1)

## 2016-10-18 LAB — GLUCOSE, CAPILLARY: GLUCOSE-CAPILLARY: 118 mg/dL — AB (ref 65–99)

## 2016-10-18 LAB — APTT: APTT: 29 s (ref 24–36)

## 2016-10-18 LAB — TROPONIN I: Troponin I: <0.03 ng/mL

## 2016-10-18 LAB — PROTIME-INR
INR: 0.92
Prothrombin Time: 12.3 seconds (ref 11.4–15.2)

## 2016-10-18 MED ORDER — ALPRAZOLAM 0.5 MG PO TABS
0.5000 mg | ORAL_TABLET | Freq: Every evening | ORAL | Status: DC | PRN
  Administered 2016-10-18: 0.5 mg via ORAL
  Filled 2016-10-18: qty 1

## 2016-10-18 MED ORDER — SODIUM CHLORIDE 0.9 % IV BOLUS (SEPSIS)
1000.0000 mL | Freq: Once | INTRAVENOUS | Status: AC
  Administered 2016-10-18: 1000 mL via INTRAVENOUS

## 2016-10-18 MED ORDER — METOPROLOL TARTRATE 25 MG PO TABS
12.5000 mg | ORAL_TABLET | Freq: Two times a day (BID) | ORAL | Status: DC
  Administered 2016-10-18 – 2016-10-19 (×2): 12.5 mg via ORAL
  Filled 2016-10-18 (×2): qty 1

## 2016-10-18 MED ORDER — ENOXAPARIN SODIUM 40 MG/0.4ML ~~LOC~~ SOLN
40.0000 mg | SUBCUTANEOUS | Status: DC
  Administered 2016-10-18: 40 mg via SUBCUTANEOUS
  Filled 2016-10-18: qty 0.4

## 2016-10-18 MED ORDER — ONDANSETRON HCL 4 MG/2ML IJ SOLN: 4.0000 mg | Freq: Four times a day (QID) | INTRAMUSCULAR | Status: DC | PRN

## 2016-10-18 MED ORDER — ROSUVASTATIN CALCIUM 5 MG PO TABS
5.0000 mg | ORAL_TABLET | Freq: Every day | ORAL | Status: DC
  Administered 2016-10-18: 5 mg via ORAL
  Filled 2016-10-18 (×2): qty 1

## 2016-10-18 MED ORDER — ONDANSETRON HCL 4 MG PO TABS: 4.0000 mg | ORAL_TABLET | Freq: Four times a day (QID) | ORAL | Status: DC | PRN

## 2016-10-18 MED ORDER — ACETAMINOPHEN 325 MG PO TABS: 650.0000 mg | ORAL_TABLET | Freq: Four times a day (QID) | ORAL | Status: DC | PRN

## 2016-10-18 MED ORDER — ASPIRIN EC 81 MG PO TBEC
81.0000 mg | DELAYED_RELEASE_TABLET | Freq: Every day | ORAL | Status: DC
  Administered 2016-10-19: 09:00:00 81 mg via ORAL
  Filled 2016-10-18: qty 1

## 2016-10-18 MED ORDER — HYDROCODONE-ACETAMINOPHEN 5-325 MG PO TABS
1.0000 | ORAL_TABLET | Freq: Four times a day (QID) | ORAL | Status: DC | PRN
  Administered 2016-10-18 – 2016-10-19 (×3): 1 via ORAL
  Filled 2016-10-18 (×4): qty 1

## 2016-10-18 MED ORDER — ACETAMINOPHEN 650 MG RE SUPP: 650.0000 mg | Freq: Four times a day (QID) | RECTAL | Status: DC | PRN

## 2016-10-18 MED ORDER — ASPIRIN 81 MG PO CHEW
324.0000 mg | CHEWABLE_TABLET | Freq: Once | ORAL | Status: AC
  Administered 2016-10-18: 324 mg via ORAL
  Filled 2016-10-18: qty 4

## 2016-10-18 MED ORDER — SODIUM CHLORIDE 0.9 % IV SOLN
INTRAVENOUS | Status: DC
  Administered 2016-10-18 – 2016-10-19 (×2): via INTRAVENOUS

## 2016-10-18 MED ORDER — GABAPENTIN 400 MG PO CAPS
800.0000 mg | ORAL_CAPSULE | Freq: Three times a day (TID) | ORAL | Status: DC
  Administered 2016-10-18 – 2016-10-19 (×2): 800 mg via ORAL
  Filled 2016-10-18 (×2): qty 2

## 2016-10-18 MED ORDER — GABAPENTIN 400 MG PO CAPS: 800.0000 mg | ORAL_CAPSULE | Freq: Three times a day (TID) | ORAL | Status: DC

## 2016-10-18 NOTE — Assessment & Plan Note (Signed)
She is clearly improved in the meantime. Not hypoxic. Not using any inhalers currently. Lungs are clear. Discussed with patient about having rescue albuterol in case she needed it. Prescription sent. She will follow-up with pulmonary. >25 minutes spent in face to face time with patient, >50% spent in counselling or coordination of care.
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
## 2016-10-18 NOTE — Assessment & Plan Note (Signed)
Flu shot today.  D/w pt about shingrix.   Reasonable to recheck DXA next year, ie consider in 2019.   Mammogram d/w pt.  She'll consider.  She isn't sure she would want intervention if she had abnormal mammogram.  She'll update me as needed.  Advance directive- d/w pt.  Daughter and son equally designated if patient were incapacitated.  Colonoscopy deferred until next year, with her prev illness this year.

## 2016-10-18 NOTE — ED Notes (Signed)
Report was called and given to irian floor nurse who states that pt is going to room 120 instead of 132.

## 2016-10-18 NOTE — ED Notes (Signed)
Admitting MD at bedside.

## 2016-10-18 NOTE — ED Triage Notes (Signed)
Pt via pov from home with daughter in law, who states she called her and pt stated she had "white cloud" over her eyes and her speech was slurred. Pt reports stiff neck. Slurred speech has resolved. Pt alert & oriented with NAD noted.

## 2016-10-18 NOTE — Assessment & Plan Note (Signed)
Indication for chronic opioid:  Chronic back pain.  Medication and dose: hydrocodon 5/325 # pills per month:  120 Last UDS date: 08/15/16 Pain contract signed (Y/N): yes Date narcotic database last reviewed (include red flags):  10/18/16

## 2016-10-18 NOTE — ED Notes (Signed)
Vital signs stable. Family at bedside.

## 2016-10-18 NOTE — ED Notes (Addendum)
Symptoms of slurred speech, visual changes, head fullness.  Started at 11am today lasting approx 45 min, arrived to ED with symptoms of head fullness, denies slurred speech or visual changes currently.  Spoke with Dr.Veronese aware and proceeded to order stroke protocols but do not call a code stroke
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
## 2016-10-18 NOTE — ED Notes (Signed)
cbg 118

## 2016-10-18 NOTE — Telephone Encounter (Signed)
Per chart review tab pt went to [facility] ED. 

## 2016-10-18 NOTE — Assessment & Plan Note (Signed)
Indication for chronic opioid:  Chronic back pain.  Medication and dose: hydrocodon 5/325 # pills per month:  120 Last UDS date: 08/15/16 Pain contract signed (Y/N): yes Date narcotic database last reviewed (include red flags):  10/18/16 She continues with chronic lower back pain, that affects her legs- B leg pain.  She is managing to put up with the pain with the current rx.  No new sx.  No ADE on med.  She has chronic L leg weakness that is unchanged.  She is on bowel regimen with high fiber diet.  D/w pt about exercise as tolerated.  She continues on gabapentin at baseline.

## 2016-10-18 NOTE — Assessment & Plan Note (Addendum)
Labs pending. See notes on labs. Blood pressure is slightly elevated today but she had not had her dose of beta blocker this morning. Okay to continue as is. Discussed with patient. She agrees.

## 2016-10-18 NOTE — Telephone Encounter (Signed)
[facility]  Patient Name: Megan Peterson  DOB: 1940-01-06    Initial Comment Caller states mother c/o having an episode where she saw a white cloud, was unable to walk without assistance afterwards briefly and had some slurred speech. She is on her way to her mother now.    Nurse Assessment  Nurse: Harlow Mares, RN, Suanne Marker Date/Time (Eastern Time): 10/18/2016 12:53:52 PM  Confirm and document reason for call. If symptomatic, describe symptoms. ---Caller states mother c/o having an episode where she saw a white cloud, was unable to walk without assistance afterwards briefly and had some slurred speech. She is on her way to her mother now.  Does the patient have any new or worsening symptoms? ---Yes  Will a triage be completed? ---Yes  Related visit to physician within the last 2 weeks? ---N/A  Does the PT have any chronic conditions? (i.e. diabetes, asthma, etc.) ---Yes  List chronic conditions. ---COPD;  Is this a behavioral health or substance abuse call? ---No     Guidelines    Guideline Title Affirmed Question Affirmed Notes  Neurologic Deficit [1] Loss of speech or garbled speech AND [2] sudden onset AND [3] brief (now gone)    Final Disposition User   Go to ED Now (or PCP triage) Harlow Mares, RN, [facility]   Caller Disagree/Comply Comply  Caller Understands Yes  PreDisposition InappropriateToAsk

## 2016-10-18 NOTE — ED Notes (Signed)
Emergency Dept Provider at bedside. Pt states she had a white fog in front of both eyes, broke out in sweat, speech slurred according to daughter, this occured about 1130. Daughter denienes facial droop. Pt denies chest pain n/v/ and abd pain. Pt states vision is normal, and she feels normal.

## 2016-10-19 ENCOUNTER — Observation Stay (HOSPITAL_BASED_OUTPATIENT_CLINIC_OR_DEPARTMENT_OTHER)
Admit: 2016-10-19 | Discharge: 2016-10-19 | Disposition: A | Discharge status: Home / Self Care | Payer: PPO | Attending: Internal Medicine | Admitting: Internal Medicine

## 2016-10-19 DIAGNOSIS — G459 Transient cerebral ischemic attack, unspecified: Secondary | ICD-10-CM | POA: Diagnosis not present

## 2016-10-19 DIAGNOSIS — I10 Essential (primary) hypertension: Secondary | ICD-10-CM | POA: Diagnosis not present

## 2016-10-19 DIAGNOSIS — E785 Hyperlipidemia, unspecified: Secondary | ICD-10-CM | POA: Diagnosis not present

## 2016-10-19 DIAGNOSIS — I340 Nonrheumatic mitral (valve) insufficiency: Secondary | ICD-10-CM | POA: Diagnosis not present

## 2016-10-19 DIAGNOSIS — M545 Low back pain: Secondary | ICD-10-CM | POA: Diagnosis not present

## 2016-10-19 LAB — BASIC METABOLIC PANEL
ANION GAP: 7 (ref 5–15)
BUN: 20 mg/dL (ref 6–20)
CO2: 25 mmol/L (ref 22–32)
Calcium: 8.4 mg/dL — ABNORMAL LOW (ref 8.9–10.3)
Chloride: 107 mmol/L (ref 101–111)
Creatinine, Ser: 0.95 mg/dL (ref 0.44–1.00)
GFR calc Af Amer: >60 mL/min (ref >60)
GFR calc non Af Amer: 57 mL/min — ABNORMAL LOW (ref >60)
GLUCOSE: 95 mg/dL (ref 65–99)
POTASSIUM: 4.1 mmol/L (ref 3.5–5.1)
Sodium: 139 mmol/L (ref 135–145)

## 2016-10-19 LAB — CBC
HCT: 33 % — ABNORMAL LOW (ref 35.0–47.0)
Hemoglobin: 11.3 g/dL — ABNORMAL LOW (ref 12.0–16.0)
MCH: 30.7 pg (ref 26.0–34.0)
MCHC: 34.3 g/dL (ref 32.0–36.0)
MCV: 89.5 fL (ref 80.0–100.0)
PLATELETS: 200 10*3/uL (ref 150–440)
RBC: 3.69 MIL/uL — ABNORMAL LOW (ref 3.80–5.20)
RDW: 13 % (ref 11.5–14.5)
WBC: 6.7 10*3/uL (ref 3.6–11.0)

## 2016-10-19 MED ORDER — ROSUVASTATIN CALCIUM 5 MG PO TABS: 5.0000 mg | ORAL_TABLET | Freq: Every day | ORAL | 1 refills | Status: DC

## 2016-10-19 MED ORDER — ASPIRIN 81 MG PO TBEC: 81.0000 mg | DELAYED_RELEASE_TABLET | Freq: Every day | ORAL | 1 refills | Status: DC

## 2016-10-19 NOTE — Evaluation (Signed)
Occupational Therapy Evaluation Patient Details Name: Megan Peterson MRN: [id number] DOB: Jul 25, 1940 Today's Date: 10/19/2016    History of Present Illness 76yo female admitted for TIA. PMHx includes chronic lower back pain, HTN, HLD, previous smoker, and anxiety. CT and MRI negative for acute stroke.   Clinical Impression   Pt seen for OT Evaluation this date. Pt was independent at baseline, living by herself in a single family home with 1 step to enter. Pt presents with no functional deficits in vision, sensation, coordination, strength, balance, mobility, or self care skills. Pt at baseline independence and eager to return home. No skilled OT needs identified. Educated pt and daughter in signs/symptoms of a stroke, both verbalized understanding. Will sign off. No skilled OT needs identified.     Follow Up Recommendations  No OT follow up    Equipment Recommendations  None recommended by OT    Recommendations for Other Services       Precautions / Restrictions Precautions Precautions: None Restrictions Weight Bearing Restrictions: No      Mobility Bed Mobility Overal bed mobility: Independent                Transfers Overall transfer level: Independent                    Balance Overall balance assessment: Independent                                         ADL either performed or assessed with clinical judgement   ADL Overall ADL's : Independent;At baseline                                             Vision Baseline Vision/History:  (hx detached retina in L eye with decreased acuity at baseline) Patient Visual Report: No change from baseline Vision Assessment?: No apparent visual deficits     Perception     Praxis      Pertinent Vitals/Pain Pain Assessment: No/denies pain     Hand Dominance Right   Extremity/Trunk Assessment Upper Extremity Assessment Upper Extremity Assessment: Overall WFL for tasks  assessed   Lower Extremity Assessment Lower Extremity Assessment: Overall WFL for tasks assessed   Cervical / Trunk Assessment Cervical / Trunk Assessment: Normal   Communication Communication Communication: No difficulties   Cognition Arousal/Alertness: Awake/alert Behavior During Therapy: WFL for tasks assessed/performed Overall Cognitive Status: Within Functional Limits for tasks assessed                                     General Comments       Exercises Other Exercises Other Exercises: pt/dtr educated in signs/symptoms of a stroke, both verbalized understanding.   Shoulder Instructions      Home Living Family/patient expects to be discharged to:: Private residence Living Arrangements: Alone Available Help at Discharge: Family;Available PRN/intermittently Type of Home: House Home Access: Stairs to enter [employer] of Steps: 1 step to enter Entrance Stairs-Rails: None Home Layout: One level     Bathroom Shower/Tub: Teacher, early years/pre: Standard     Home Equipment: Shower seat          Prior Functioning/Environment Level of  Independence: Independent        Comments: indep with mobility, ADL, and IADL, driving, no falls in past 12 months        OT Problem List:        OT Treatment/Interventions:      OT Goals(Current goals can be found in the care plan section) Acute Rehab OT Goals Patient Stated Goal: go home OT Goal Formulation: All assessment and education complete, DC therapy  OT Frequency:     Barriers to D/C:            Co-evaluation              AM-PAC PT "6 Clicks" Daily Activity     Outcome Measure Help from another person eating meals?: None Help from another person taking care of personal grooming?: None Help from another person toileting, which includes using toliet, bedpan, or urinal?: None Help from another person bathing (including washing, rinsing, drying)?: None Help from  another person to put on and taking off regular upper body clothing?: None Help from another person to put on and taking off regular lower body clothing?: None 6 Click Score: 24   End of Session    Activity Tolerance: Patient tolerated treatment well Patient left: in bed;with call bell/phone within reach;with family/visitor present  OT Visit Diagnosis: Other abnormalities of gait and mobility (R26.89)                Time: 4920-1007 OT Time Calculation (min): 10 min Charges:  OT General Charges $OT Visit: 1 Visit OT Evaluation $OT Eval Low Complexity: 1 Low G-Codes: OT G-codes **NOT FOR INPATIENT CLASS** Functional Assessment Tool Used: AM-PAC 6 Clicks Daily Activity;Clinical judgement Functional Limitation: Self care Self Care Current Status (H2197): 0 percent impaired, limited or restricted Self Care Goal Status (J8832): 0 percent impaired, limited or restricted Self Care Discharge Status (P4982): 0 percent impaired, limited or restricted   Jeni Salles, MPH, MS, OTR/L ascom [phone] 10/19/16, 9:53 AM
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
## 2016-10-19 NOTE — Care Management Obs Status (Signed)
[place] NOTIFICATION   Patient Details  Name: Megan Peterson MRN: [id number] Date of Birth: [birth year]   Medicare Observation Status Notification Given:  Yes    Shelbie Ammons, RN 10/19/2016, 8:48 AM

## 2016-10-19 NOTE — Progress Notes (Signed)
Discharge instructions given and went over with patient and patients daughter at bedside. Prescriptions reviewed. All questions answered. Patient discharged home via wheelchair by nursing staff. Madlyn Frankel, RN

## 2016-10-19 NOTE — Progress Notes (Signed)
Medications administered by student RN [phone] with supervision of Clinical Instructor Lynann Bologna MSN, RN-BC or patient's assigned RN.

## 2016-10-19 NOTE — Telephone Encounter (Signed)
Agreed, thanks

## 2016-10-19 NOTE — Progress Notes (Signed)
  Echocardiogram 2D Echocardiogram with bubble study has been performed.  Megan Peterson M 10/19/2016, 1:08 PM

## 2016-10-19 NOTE — Progress Notes (Signed)
Patient refused bed alarm and educated. 

## 2016-10-19 NOTE — Evaluation (Signed)
Physical Therapy Evaluation Patient Details Name: Megan Peterson MRN: [id number] DOB: Nov 30, 1940 Today's Date: 10/19/2016   History of Present Illness  76yo female admitted for TIA. PMHx includes chronic lower back pain, HTN, HLD, previous smoker, and anxiety. CT and MRI negative for acute stroke.  Clinical Impression  Pt did well with PT exam, showed good confidence with mobility, ambulation and equal strength bilaterally.  Pt with no functional limitations and though she stated that her walking was a little slower than baseline she was safe, confident and community appropriate.  Pt does not require further PT intervention.     Follow Up Recommendations No PT follow up    Equipment Recommendations       Recommendations for Other Services       Precautions / Restrictions Precautions Precautions: None Restrictions Weight Bearing Restrictions: No      Mobility  Bed Mobility Overal bed mobility: Independent                Transfers Overall transfer level: Independent                  Ambulation/Gait Ambulation/Gait assistance: Modified independent (Device/Increase time) Ambulation Distance (Feet): 200 Feet Assistive device: None       General Gait Details: Pt walked with consistent, community appropriate cadence and speed.  She had no safety issues and ultimately did well.  Stairs            Wheelchair Mobility    Modified Rankin (Stroke Patients Only)       Balance Overall balance assessment: Independent                                           Pertinent Vitals/Pain Pain Assessment: No/denies pain    Home Living Family/patient expects to be discharged to:: Private residence Living Arrangements: Alone Available Help at Discharge: Family;Available PRN/intermittently Type of Home: House Home Access: Stairs to enter Entrance Stairs-Rails: None Entrance Stairs-Number of Steps: 1 step to enter Home Layout: One  level Home Equipment: Shower seat      Prior Function Level of Independence: Independent         Comments: indep with mobility, ADL, and IADL, driving, no falls in past 12 months     Hand Dominance   Dominant Hand: Right    Extremity/Trunk Assessment   Upper Extremity Assessment Upper Extremity Assessment: Overall WFL for tasks assessed    Lower Extremity Assessment Lower Extremity Assessment: Overall WFL for tasks assessed    Cervical / Trunk Assessment Cervical / Trunk Assessment: Normal  Communication   Communication: No difficulties  Cognition Arousal/Alertness: Awake/alert Behavior During Therapy: WFL for tasks assessed/performed Overall Cognitive Status: Within Functional Limits for tasks assessed                                        General Comments      Exercises Other Exercises Other Exercises: pt/dtr educated in signs/symptoms of a stroke, both verbalized understanding.   Assessment/Plan    PT Assessment Patent does not need any further PT services  PT Problem List         PT Treatment Interventions      PT Goals (Current goals can be found in the Care Plan section)  Acute Rehab PT Goals Patient  Stated Goal: go home PT Goal Formulation: All assessment and education complete, DC therapy    Frequency     Barriers to discharge        Co-evaluation               AM-PAC PT "6 Clicks" Daily Activity  Outcome Measure Difficulty turning over in bed (including adjusting bedclothes, sheets and blankets)?: None Difficulty moving from lying on back to sitting on the side of the bed? : None Difficulty sitting down on and standing up from a chair with arms (e.g., wheelchair, bedside commode, etc,.)?: None Help needed moving to and from a bed to chair (including a wheelchair)?: None Help needed walking in hospital room?: None Help needed climbing 3-5 steps with a railing? : None 6 Click Score: 24    End of Session  Equipment Utilized During Treatment: Gait belt Activity Tolerance: Patient tolerated treatment well     PT Visit Diagnosis: Muscle weakness (generalized) (M62.81)    Time: 9735-3299 PT Time Calculation (min) (ACUTE ONLY): 17 min   Charges:   PT Evaluation $PT Eval Low Complexity: 1 Low     PT G Codes:   PT G-Codes **NOT FOR INPATIENT CLASS** Functional Assessment Tool Used: AM-PAC 6 Clicks Basic Mobility Functional Limitation: Mobility: Walking and moving around Mobility: Walking and Moving Around Current Status (M4268): 0 percent impaired, limited or restricted Mobility: Walking and Moving Around Goal Status (T4196): 0 percent impaired, limited or restricted Mobility: Walking and Moving Around Discharge Status (Q2297): 0 percent impaired, limited or restricted    Kreg Shropshire, DPT 10/19/2016, 10:37 AM

## 2016-10-24 ENCOUNTER — Encounter: Payer: Self-pay | Admitting: Family Medicine

## 2016-10-24 ENCOUNTER — Ambulatory Visit (INDEPENDENT_AMBULATORY_CARE_PROVIDER_SITE_OTHER): Payer: PPO | Admitting: Family Medicine

## 2016-10-24 VITALS — BP 124/68 | HR 69 | Temp 97.9°F | Wt 126.2 lb

## 2016-10-24 DIAGNOSIS — G459 Transient cerebral ischemic attack, unspecified: Secondary | ICD-10-CM

## 2016-10-24 NOTE — Patient Instructions (Addendum)
If you have any more/new symptoms then go to the ER or dial 911.   If you have aches on the crestor then update me.  You may need to change to every other day dosing.  Recheck labs in about 2 months.  Take care.  Glad to see you.

## 2016-10-24 NOTE — Progress Notes (Signed)
76 y.o. femalewith a known history of chronic low back pain, hypertension, hyperlipidemia, prior history of smoking,anxiety presents to hospital secondary to slurred speech and generalized weakness that started this morning.  #1 TIA-  Versus generalized weakness due to dehydration. -Presented with slurred speech and cloudy vision which resolved in less than 2 hours. -MRI of the brain without any significant deficits. MRA without any occlusions. -Carotid Dopplers with minimal atherosclerotic plaque but no hemodynamically significant stenosis. Echo done.   -Lipid profile done as outpatient prior to admission showing LDL of 186-patient says she is intolerant to statins due to myalgias.  However hasn't tried Crestor yet. So started on low-dose Crestor at this time. Follow-up with PCP per the same. -Not taking any aspirin at home, so started on baby aspirin. -PT and OT worked with the patient and she does not have any outpatient needs. No further neuro deficits here in the hospital and she is being discharged in a stable condition.  #2 hypertension-continue low-dose metoprolol  #3 hyperlipidemia-started on low-dose crestor at this time  #4 chronic low back pain status post back surgeries- continue home medications ====================================== Events and inpatient course d/w pt.  Total episode = 15 minutes.  Totally resolved, no residual sx. She had speech changes, vision changes, diffuse weakness.  To ER, admitted for presumed TIA.   She didn't have heart racing.  No syncope.  No known AF.    She has some chest tightness at rest but not with exertion and it gets gets better with drinking any liquid.  This is at baseline and not related to the events above.  She has been able to tolerate crestor so far, with statin cautions d/w pt.  We talked about diet and cutting back on fatty foods.    Discussed TIA/CVA path/phys and risk and treatment, secondary prevention.   Echo results d/w  pt.  No clot seen.    PMH and SH reviewed  ROS: Per HPI unless specifically indicated in ROS section   Meds, vitals, and allergies reviewed.   GEN: nad, alert and oriented HEENT: mucous membranes moist NECK: supple w/o LA CV: rrr.  PULM: ctab, no inc wob ABD: soft, +bs EXT: no edema SKIN: no acute rash CN 2-12 wnl B, S/S/DTR wnl x4

## 2016-10-25 ENCOUNTER — Telehealth: Payer: Self-pay | Admitting: Family Medicine

## 2016-10-25 NOTE — Telephone Encounter (Signed)
Pt was seen 10/24/16; do you want pt on any exercise regimen.Please advise.

## 2016-10-25 NOTE — Telephone Encounter (Signed)
Copied from [place]. Topic: Quick Communication - See Telephone Encounter >> Oct 25, 2016  9:14 AM Ether Griffins B wrote: CRM for notification. See Telephone encounter for:  10/25/16.  Pt would like Dr. Josefine Class nurse to call shes wanting to know if she needs to be doing any type of exercise.

## 2016-10-25 NOTE — Telephone Encounter (Signed)
Pt returned call, said that she did not mean to hang up and had additional questions.   Pt request cb

## 2016-10-25 NOTE — Telephone Encounter (Signed)
No specific exercise needed however any exercise that she can tolerate in general would be reasonable.  Walking, etc. as pain allows, would be reasonable.  Thanks.

## 2016-10-25 NOTE — Telephone Encounter (Signed)
Patient notified as instructed by telephone and verbalized understanding. 

## 2016-10-25 NOTE — Assessment & Plan Note (Signed)
Now on ASA and statin, BP wnl.  D/w pt about secondary prevention.  Echo and carotids prev evaluated.  See above.  Okay for outpatient f/u.  Recheck lipids in about 2 months, with statin caution.  TIA/cva/neuro cautions d/w pt. >25 minutes spent in face to face time with patient, >50% spent in counselling or coordination of care.

## 2016-10-25 NOTE — Telephone Encounter (Signed)
Spoke with patient daughter and told her what Dr. Damita Dunnings said about her doing some exercise.

## 2016-11-13 ENCOUNTER — Ambulatory Visit: Payer: PPO | Admitting: Pulmonary Disease

## 2016-11-13 ENCOUNTER — Encounter: Payer: Self-pay | Admitting: Pulmonary Disease

## 2016-11-13 DIAGNOSIS — J479 Bronchiectasis, uncomplicated: Secondary | ICD-10-CM | POA: Diagnosis not present

## 2016-11-13 DIAGNOSIS — J432 Centrilobular emphysema: Secondary | ICD-10-CM | POA: Diagnosis not present

## 2016-11-13 NOTE — Assessment & Plan Note (Signed)
Complete taking Spiriva once daily   call us back for prescription of Anoro once you are done with this.   Call us if you develop chest cold, wheezing or yellow-green sputum

## 2016-11-13 NOTE — Assessment & Plan Note (Signed)
Discussed simple measures for mucus clearance

## 2016-11-13 NOTE — Progress Notes (Signed)
   Subjective:    Patient ID: Megan Peterson, female    DOB: November 09, 1940, 76 y.o.   MRN: [id number]  HPI   76 year old female former smoker FU  for COPD, bronchiectasis and chronic respiratory failure on oxygen   she improved significantly after well prior office visit and came off oxygen..  In fact she stopped taking anoro since this was a very expensive she underwent pulmonary rehab program and felt significantly improved She only has albuterol to use as needed and has not needed this for the past few weeks She had Spiriva leftover  She quit smoking 25 years ago  She admits to being sedentary and has not really started on an exercise program after completing rehab  Significant tests/ events reviewed  PFT  06/2016 that showed similar lung function (compared 2013) with moderate to severe airflow obstruction with an FEV1 at 55%, ratio 52, positive bronchodilator response, FVC 80%, DLCO 56%.  CT angiogram 04/21/16 was negative for pulmonary embolism but showed new bilateral upper lobe groundglass infiltrates with mild bronchiectasis in both lower lobes   Review of Systems neg for any significant sore throat, dysphagia, itching, sneezing, nasal congestion or excess/ purulent secretions, fever, chills, sweats, unintended wt loss, pleuritic or exertional cp, hempoptysis, orthopnea pnd or change in chronic leg swelling. Also denies presyncope, palpitations, heartburn, abdominal pain, nausea, vomiting, diarrhea or change in bowel or urinary habits, dysuria,hematuria, rash, arthralgias, visual complaints, headache, numbness weakness or ataxia.     Objective:   Physical Exam   Gen. Pleasant, well-nourished, in no distress ENT - no thrush, no post nasal drip Neck: No JVD, no thyromegaly, no carotid bruits Lungs: no use of accessory muscles, no dullness to percussion, clear without rales or rhonchi  Cardiovascular: Rhythm regular, heart sounds  normal, no murmurs or gallops, no peripheral  edema Musculoskeletal: No deformities, no cyanosis or clubbing         Assessment & Plan:

## 2016-11-13 NOTE — Patient Instructions (Signed)
Complete taking Spiriva once daily   call us back for prescription of Anoro once you are done with this.   Call us if you develop chest cold, wheezing or yellow-green sputum

## 2016-11-14 ENCOUNTER — Other Ambulatory Visit: Payer: Self-pay | Admitting: Family Medicine

## 2016-11-14 NOTE — Telephone Encounter (Signed)
Copied from [place] [id number]. Topic: Quick Communication - See Telephone Encounter >> Nov 14, 2016 10:08 AM Vernona Rieger wrote: CRM for notification. See Telephone encounter for:  Refill on hydrocodone Pick up in office 11/14/16.

## 2016-11-14 NOTE — Telephone Encounter (Signed)
Last refill 10/15/16 #120 Last office visit 10/17/16

## 2016-11-14 NOTE — Telephone Encounter (Signed)
Pt. Request refill of controlled medication.

## 2016-11-15 MED ORDER — HYDROCODONE-ACETAMINOPHEN 5-325 MG PO TABS: 1.0000 | ORAL_TABLET | Freq: Four times a day (QID) | ORAL | 0 refills | Status: DC | PRN

## 2016-11-15 NOTE — Telephone Encounter (Signed)
Printed, will sign tomorrow at clinic.  Needs chronic pain appointment set up.  Thanks.

## 2016-11-16 NOTE — Telephone Encounter (Signed)
Pt calling to see if her rx is ready. Please call [phone]

## 2016-11-16 NOTE — Telephone Encounter (Signed)
Patient advised.  Rx left at front desk for pick up. 

## 2016-12-11 ENCOUNTER — Ambulatory Visit: Payer: PPO | Admitting: Family Medicine

## 2016-12-15 ENCOUNTER — Ambulatory Visit: Payer: PPO | Admitting: Family Medicine

## 2016-12-18 ENCOUNTER — Other Ambulatory Visit: Payer: Self-pay | Admitting: Family Medicine

## 2016-12-18 ENCOUNTER — Ambulatory Visit (INDEPENDENT_AMBULATORY_CARE_PROVIDER_SITE_OTHER): Payer: PPO | Admitting: Family Medicine

## 2016-12-18 ENCOUNTER — Encounter: Payer: Self-pay | Admitting: Family Medicine

## 2016-12-18 VITALS — BP 122/68 | HR 82 | Temp 98.1°F | Wt 128.2 lb

## 2016-12-18 DIAGNOSIS — Z0289 Encounter for other administrative examinations: Secondary | ICD-10-CM | POA: Diagnosis not present

## 2016-12-18 DIAGNOSIS — G8929 Other chronic pain: Secondary | ICD-10-CM | POA: Diagnosis not present

## 2016-12-18 DIAGNOSIS — M545 Low back pain: Secondary | ICD-10-CM | POA: Diagnosis not present

## 2016-12-18 MED ORDER — HYDROCODONE-ACETAMINOPHEN 5-325 MG PO TABS: 1.0000 | ORAL_TABLET | Freq: Four times a day (QID) | ORAL | 0 refills | Status: DC | PRN

## 2016-12-18 MED ORDER — ROSUVASTATIN CALCIUM 5 MG PO TABS: 5.0000 mg | ORAL_TABLET | Freq: Every day | ORAL | 3 refills | Status: DC

## 2016-12-18 NOTE — Patient Instructions (Signed)
Come back for fasting labs.   Take care.  Glad to see you.  Udpate me as needed.  Plan on chronic pain visit in about 3 months.

## 2016-12-18 NOTE — Progress Notes (Signed)
Patient had to cancel appointment today.  rx printed.  Needs to be rescheduled for the next 2 weeks.  Thanks.  Addendum- pt decided to keep OV later today.  rx held until that point.

## 2016-12-18 NOTE — Progress Notes (Signed)
Indication for chronic opioid:  Chronic back pain.  Medication and dose: hydrocodone 5/325 # pills per month:  120 Last UDS date: 08/15/16 Pain contract signed (Y/N): yes Date narcotic database last reviewed (include red flags):  12/18/16  Pain inventory (1-10) Average pain:down to 1/10 with meds.   Pain now: 8/10 currently, due for pain meds.   My pain is burning in the lower back and her legs.   Pain is worse when due for med, early AM, if prolonged sitting.  Relief from meds: yes, seen above.   Gabapentin is helping some.  She clearly does better with concurrent gabapentin and hydrocodone.    In the last 24 hours, how much has pain interfered with the following (1-10 greatest interference)? General activity- "I couldn't get up without the medicine."   Relationships with others- able to function with pain med  Enjoyment of life- able to function with pain med  What time of the day is the pain the worst- AM.   Sleep is described by patient as "not good" from pain and from longstanding insomnia.  She has waking in the middle of the night.   Mobility/function Assistance device:no How many minutes can you walk: limited by pulmonary issues, about 1/s mile.   Able to climb steps: yes, with effort.   Driving: yes Disabled: no  Bowel or bladder symptoms: no Mood: "sometimes wonderful and sometimes I'm a grouch."  D/w pt.    Physicians involved in care: Dr. Elsworth Soho with pulmonary.   Any changes since last visit? No   She needed refill on crestor.  She can tolerate that med.    Meds, vitals, and allergies reviewed.   ROS: Per HPI unless specifically indicated in ROS section   GEN: nad, alert and oriented HEENT: mucous membranes moist NECK: supple w/o LA CV: rrr PULM: ctab, no inc wob ABD: soft, +bs Lower midline back ttp as expected.

## 2016-12-19 DIAGNOSIS — Z0289 Encounter for other administrative examinations: Secondary | ICD-10-CM | POA: Insufficient documentation

## 2016-12-19 DIAGNOSIS — G8929 Other chronic pain: Secondary | ICD-10-CM | POA: Insufficient documentation

## 2016-12-19 HISTORY — DX: Other chronic pain: G89.29

## 2016-12-19 NOTE — Assessment & Plan Note (Signed)
Indication for chronic opioid:  Chronic back pain.  Medication and dose: hydrocodone 5/325 # pills per month:  120 Last UDS date: 08/15/16 Pain contract signed (Y/N): yes Date narcotic database last reviewed (include red flags):  12/18/16 Continue as is, with benefit from medication noted and benefit likely greater than risk.  She agrees.

## 2016-12-22 ENCOUNTER — Other Ambulatory Visit: Payer: PPO

## 2016-12-27 ENCOUNTER — Other Ambulatory Visit (INDEPENDENT_AMBULATORY_CARE_PROVIDER_SITE_OTHER): Payer: PPO

## 2016-12-27 DIAGNOSIS — G459 Transient cerebral ischemic attack, unspecified: Secondary | ICD-10-CM | POA: Diagnosis not present

## 2016-12-27 LAB — LIPID PANEL
CHOLESTEROL: 162 mg/dL (ref 0–200)
HDL: 52.5 mg/dL (ref >39.00)
LDL Cholesterol: 86 mg/dL (ref 0–99)
NONHDL: 109.47
Total CHOL/HDL Ratio: 3
Triglycerides: 119 mg/dL (ref 0.0–149.0)
VLDL: 23.8 mg/dL (ref 0.0–40.0)

## 2016-12-28 ENCOUNTER — Encounter: Payer: Self-pay | Admitting: *Deleted

## 2016-12-29 ENCOUNTER — Telehealth: Payer: Self-pay | Admitting: Pulmonary Disease

## 2016-12-29 MED ORDER — UMECLIDINIUM-VILANTEROL 62.5-25 MCG/INH IN AEPB: 1.0000 | INHALATION_SPRAY | Freq: Every day | RESPIRATORY_TRACT | 3 refills | Status: DC

## 2016-12-29 NOTE — Telephone Encounter (Signed)
Routing back to [place] to continue following up on.

## 2016-12-29 NOTE — Telephone Encounter (Signed)
Left message for patient to call back.   Forms have been completed and faxed to [place]. Will keep forms in my look-at folder for 2 weeks to ensure they were received by [place].

## 2016-12-29 NOTE — Telephone Encounter (Signed)
Called and spoke with pt and her daughter Megan Peterson.  They will drop off all of the paper work that needs to be faxed to the pt assistance company along with the rx today.  Will hold message in triage until the papers are received.

## 2016-12-29 NOTE — Telephone Encounter (Signed)
Patient's daughter returned call.  Advised forms have been faxed to [place].  She stated no call back is needed.

## 2016-12-29 NOTE — Telephone Encounter (Signed)
Forms were dropped off by pt today and placed in the folder up front.  Will forward to CP to follow up on forms to be signed by RA.

## 2017-01-05 NOTE — Telephone Encounter (Signed)
Called [employer] to check on status of application. They have received the application and are currently processing it. Due to the next year, patient will only receive a 3 month supply. She will have to reapply for the 2019 year and show proof that she has spent over $600 out of pocket for medications.   Will send this application to scan so we can have it on file.

## 2017-01-15 ENCOUNTER — Other Ambulatory Visit: Payer: Self-pay | Admitting: Family Medicine

## 2017-01-15 NOTE — Telephone Encounter (Signed)
Electronic refill request. Gabapentin Last office visit:   12/18/16 Last Filled:    270 tablet 1 07/14/2016  Please advise.

## 2017-01-16 NOTE — Telephone Encounter (Signed)
Sent. Thanks.   

## 2017-01-22 ENCOUNTER — Telehealth: Payer: Self-pay

## 2017-01-22 NOTE — Telephone Encounter (Signed)
PA for Anoro completed on covermymeds.   PA has been faxed to the plan as a paper copy. Please contact the plan directly if you haven't received a determination in a typical timeframe.  You will be notified of the determination via fax.  How do I know if the plan approved the PA?  Add Reminder to your Dashboard  Remind me in:               Will route to CP to follow up.

## 2017-01-23 MED ORDER — UMECLIDINIUM-VILANTEROL 62.5-25 MCG/INH IN AEPB: 1.0000 | INHALATION_SPRAY | Freq: Every day | RESPIRATORY_TRACT | 3 refills | Status: DC

## 2017-01-23 NOTE — Telephone Encounter (Signed)
Pt and pharmacy made aware that anoro PA was approved. Nothing further needed.

## 2017-03-16 ENCOUNTER — Other Ambulatory Visit: Payer: Self-pay | Admitting: Family Medicine

## 2017-03-16 NOTE — Telephone Encounter (Signed)
Refill of Norco  LOV 12/18/16  Dr. Damita Dunnings  Rx verified

## 2017-03-16 NOTE — Telephone Encounter (Signed)
Electronic refill request. Hydrocodone Last office visit:   12/18/16 Last Filled:    120 tablet 0 12/18/2016  Please advise.

## 2017-03-16 NOTE — Telephone Encounter (Signed)
Copied from [place] [id number]. Topic: Quick Communication - See Telephone Encounter >> Mar 16, 2017  9:39 AM Conception Chancy, NT wrote: CRM for notification. See Telephone encounter for:  03/16/17.  Patient is calling and requesting a refill on HYDROcodone-acetaminophen (NORCO/VICODIN) 5-325 MG tablet. Please advise.   [facility] [place] [facility] [place]  [address] [place] [address]  Phone: [phone] Fax: [phone]

## 2017-03-18 NOTE — Telephone Encounter (Signed)
Per last OV note on 12/18/16, she was to plan on chronic pain visit in about 3 months.  She is due.  We're doing these rxs at [facility].  She needs a visit for this, separate from her other f/u issues/visit with [facility], etc.  Thanks. I didn't fill this rx.

## 2017-03-19 ENCOUNTER — Other Ambulatory Visit: Payer: Self-pay | Admitting: Family Medicine

## 2017-03-19 ENCOUNTER — Ambulatory Visit: Payer: Self-pay | Admitting: Hematology

## 2017-03-19 NOTE — Telephone Encounter (Signed)
Left message on voicemail for patient to call back. 

## 2017-03-19 NOTE — Telephone Encounter (Signed)
Read Dr. Josefine Class note to patient.  She verbalizes understanding of needing an appointment / Appointment made for 03-20-17 @ 3:00p.m. With Dr. Damita Dunnings

## 2017-03-20 ENCOUNTER — Encounter: Payer: Self-pay | Admitting: Family Medicine

## 2017-03-20 ENCOUNTER — Ambulatory Visit (INDEPENDENT_AMBULATORY_CARE_PROVIDER_SITE_OTHER): Payer: PPO | Admitting: Family Medicine

## 2017-03-20 VITALS — BP 120/74 | HR 81 | Temp 98.0°F | Wt 130.5 lb

## 2017-03-20 DIAGNOSIS — J441 Chronic obstructive pulmonary disease with (acute) exacerbation: Secondary | ICD-10-CM

## 2017-03-20 DIAGNOSIS — G8929 Other chronic pain: Secondary | ICD-10-CM | POA: Diagnosis not present

## 2017-03-20 DIAGNOSIS — M545 Low back pain: Secondary | ICD-10-CM

## 2017-03-20 NOTE — Progress Notes (Signed)
Rare to no use of SABA.  Daily anoro at baseline. Complaint.  Breathing is good o/w, w/o complaint.    Indication for chronic opioid:  Chronic back pain.  Medication and dose: hydrocodone 5/325 # pills per month:  120 Last UDS date: 08/15/16 Pain contract signed (Y/N): yes Date narcotic database last reviewed (include red flags):  03/20/17.     Pain inventory (1-10) Average pain: down to 1/10 with meds.   Pain now: controlled, with dose of med prior to OV.   Pain is burning in the lower back and her legs.   Pain is worse when due for med, early AM, if prolonged sitting.  Relief from meds: yes, seen above.   Gabapentin is helping some.  She clearly does better with concurrent gabapentin and hydrocodone.  She does not do as well with pain control when she is taking the medication separately.   In the last 24 hours, how much has pain interfered with the following (1-10 greatest interference)? General activity- "I couldn't get up without the medicine" she reaffirms.   Relationships with others- still able to function with pain med  Enjoyment of life- is able to function with pain med  What time of the day is the pain the worst- AM.   Sleep is described by patient as "not good" from pain and from longstanding insomnia.  She has waking in the middle of the night, partly related to pain.     Mobility/function Assistance device:no How many minutes can you walk: she can walk for 15 minutes at 2 miles an hour, 0.50mile total Able to climb steps: yes, with effort.   Driving: yes Disabled: no   Bowel or bladder symptoms: no Mood: "I'm okay as long as somebody doesn't get on my nerves."  She smiled as she said that.      Physicians involved in care: Dr. Elsworth Soho with pulmonary.   Any changes since last visit? No    Meds, vitals, and allergies reviewed.    ROS: Per HPI unless specifically indicated in ROS section    GEN: nad, alert and oriented HEENT: mucous membranes moist NECK: supple w/o  LA CV: rrr PULM: ctab, no inc wob ABD: soft, +bs Lower midline back ttp as expected, at baseline.  No flank pain.  No CVA pain.  Able to bear weight.

## 2017-03-20 NOTE — Telephone Encounter (Signed)
See other phone note. Patient scheduled an appointment.

## 2017-03-20 NOTE — Patient Instructions (Signed)
Don't change your meds for now.  I'll send your refills to [facility].  Take care.  Glad to see you.  Plan on recheck in about 3 months.

## 2017-03-21 MED ORDER — HYDROCODONE-ACETAMINOPHEN 5-325 MG PO TABS: 1.0000 | ORAL_TABLET | Freq: Four times a day (QID) | ORAL | 0 refills | Status: DC | PRN

## 2017-03-21 NOTE — Assessment & Plan Note (Signed)
Lungs are clear.  Continue with current medication use.

## 2017-03-21 NOTE — Assessment & Plan Note (Signed)
Indication for chronic opioid:  Chronic back pain.  Medication and dose: hydrocodone 5/325 # pills per month:  120 Last UDS date: 08/15/16 Pain contract signed (Y/N): yes Date narcotic database last reviewed (include red flags):  03/20/17.   No change in medications.  No sedation from medication.  Continue as is.  She agrees.

## 2017-04-12 ENCOUNTER — Ambulatory Visit: Payer: PPO

## 2017-04-12 ENCOUNTER — Other Ambulatory Visit: Payer: Self-pay | Admitting: Family Medicine

## 2017-04-12 DIAGNOSIS — E782 Mixed hyperlipidemia: Secondary | ICD-10-CM

## 2017-04-12 DIAGNOSIS — E559 Vitamin D deficiency, unspecified: Secondary | ICD-10-CM

## 2017-04-12 DIAGNOSIS — I10 Essential (primary) hypertension: Secondary | ICD-10-CM

## 2017-04-13 ENCOUNTER — Other Ambulatory Visit: Payer: Self-pay | Admitting: Family Medicine

## 2017-04-13 ENCOUNTER — Ambulatory Visit (INDEPENDENT_AMBULATORY_CARE_PROVIDER_SITE_OTHER): Payer: PPO

## 2017-04-13 VITALS — BP 120/70 | HR 76 | Temp 98.5°F | Ht <= 58 in | Wt 123.0 lb

## 2017-04-13 DIAGNOSIS — E559 Vitamin D deficiency, unspecified: Secondary | ICD-10-CM | POA: Diagnosis not present

## 2017-04-13 DIAGNOSIS — Z0000 Encounter for general adult medical examination without abnormal findings: Secondary | ICD-10-CM | POA: Diagnosis not present

## 2017-04-13 DIAGNOSIS — E782 Mixed hyperlipidemia: Secondary | ICD-10-CM

## 2017-04-13 LAB — COMPREHENSIVE METABOLIC PANEL
ALT: 11 U/L (ref 0–35)
AST: 22 U/L (ref 0–37)
Albumin: 4.5 g/dL (ref 3.5–5.2)
Alkaline Phosphatase: 88 U/L (ref 39–117)
BUN: 16 mg/dL (ref 6–23)
CALCIUM: 10 mg/dL (ref 8.4–10.5)
CO2: 30 meq/L (ref 19–32)
Chloride: 102 mEq/L (ref 96–112)
Creatinine, Ser: 1.01 mg/dL (ref 0.40–1.20)
GFR: 56.67 mL/min — ABNORMAL LOW (ref >60.00)
GLUCOSE: 82 mg/dL (ref 70–99)
POTASSIUM: 5.3 meq/L — AB (ref 3.5–5.1)
Sodium: 140 mEq/L (ref 135–145)
Total Bilirubin: 0.5 mg/dL (ref 0.2–1.2)
Total Protein: 7.6 g/dL (ref 6.0–8.3)

## 2017-04-13 LAB — VITAMIN D 25 HYDROXY (VIT D DEFICIENCY, FRACTURES): VITD: 22.3 ng/mL — ABNORMAL LOW (ref 30.00–100.00)

## 2017-04-13 LAB — CBC WITH DIFFERENTIAL/PLATELET
BASOS ABS: 0.1 10*3/uL (ref 0.0–0.1)
BASOS PCT: 1.2 % (ref 0.0–3.0)
EOS PCT: 3.3 % (ref 0.0–5.0)
Eosinophils Absolute: 0.3 10*3/uL (ref 0.0–0.7)
HEMATOCRIT: 40.6 % (ref 36.0–46.0)
Hemoglobin: 13.5 g/dL (ref 12.0–15.0)
LYMPHS ABS: 3.1 10*3/uL (ref 0.7–4.0)
Lymphocytes Relative: 32.9 % (ref 12.0–46.0)
MCHC: 33.3 g/dL (ref 30.0–36.0)
MCV: 92.5 fl (ref 78.0–100.0)
MONOS PCT: 9.9 % (ref 3.0–12.0)
Monocytes Absolute: 0.9 10*3/uL (ref 0.1–1.0)
NEUTROS ABS: 5 10*3/uL (ref 1.4–7.7)
NEUTROS PCT: 52.7 % (ref 43.0–77.0)
PLATELETS: 282 10*3/uL (ref 150.0–400.0)
RBC: 4.39 Mil/uL (ref 3.87–5.11)
RDW: 12.4 % (ref 11.5–15.5)
WBC: 9.4 10*3/uL (ref 4.0–10.5)

## 2017-04-13 MED ORDER — TRIAMCINOLONE ACETONIDE 0.1 % EX CREA: 1.0000 "application " | TOPICAL_CREAM | Freq: Two times a day (BID) | CUTANEOUS | 0 refills | Status: DC | PRN
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
## 2017-04-13 NOTE — Progress Notes (Signed)
PCP notes:   Health maintenance:  Colonoscopy - addressed  Abnormal screenings:   None  Patient concerns:   Patient reports red, itchy rash to left lower leg. Upon notification, PCP assessed patient's skin.   Nurse concerns:  None  Next PCP appt:   06/21/2017 @ 0930  I reviewed health advisor's note, was available for consultation on the day of service listed in this note, and agree with documentation and plan. Elsie Stain, MD.

## 2017-04-13 NOTE — Patient Instructions (Addendum)
[name] , Thank you for taking time to come for your Medicare Wellness Visit. I appreciate your ongoing commitment to your health goals. Please review the following plan we discussed and let me know if I can assist you in the future.   These are the goals we discussed: Goals    . DIET - INCREASE WATER INTAKE     Starting 04/13/2017, I will continue to drink 3-4 16 oz bottles of water daily.        This is a list of the screening recommended for you and due dates:  Health Maintenance  Topic Date Due  . Colon Cancer Screening  01/01/2018*  . Flu Shot  08/02/2017  . DEXA scan (bone density measurement)  01/07/2019  . Tetanus Vaccine  06/06/2021  . Pneumonia vaccines  Completed  *Topic was postponed. The date shown is not the original due date.   Preventive Care for Adults  A healthy lifestyle and preventive care can promote health and wellness. Preventive health guidelines for adults include the following key practices.  . A routine yearly physical is a good way to check with your health care provider about your health and preventive screening. It is a chance to share any concerns and updates on your health and to receive a thorough exam.  . Visit your dentist for a routine exam and preventive care every 6 months. Brush your teeth twice a day and floss once a day. Good oral hygiene prevents tooth decay and gum disease.  . The frequency of eye exams is based on your age, health, family medical history, use  of contact lenses, and other factors. Follow your health care provider's recommendations for frequency of eye exams.  . Eat a healthy diet. Foods like vegetables, fruits, whole grains, low-fat dairy products, and lean protein foods contain the nutrients you need without too many calories. Decrease your intake of foods high in solid fats, added sugars, and salt. Eat the right amount of calories for you. Get information about a proper diet from your health care provider, if  necessary.  . Regular physical exercise is one of the most important things you can do for your health. Most adults should get at least 150 minutes of moderate-intensity exercise (any activity that increases your heart rate and causes you to sweat) each week. In addition, most adults need muscle-strengthening exercises on 2 or more days a week.  Silver Sneakers may be a benefit available to you. To determine eligibility, you may visit the website: www.silversneakers.com or contact program at [phone] Mon-Fri between 8AM-8PM.   . Maintain a healthy weight. The body mass index (BMI) is a screening tool to identify possible weight problems. It provides an estimate of body fat based on height and weight. Your health care provider can find your BMI and can help you achieve or maintain a healthy weight.   For adults 20 years and older: ? A BMI below 18.5 is considered underweight. ? A BMI of 18.5 to 24.9 is normal. ? A BMI of 25 to 29.9 is considered overweight. ? A BMI of 30 and above is considered obese.   . Maintain normal blood lipids and cholesterol levels by exercising and minimizing your intake of saturated fat. Eat a balanced diet with plenty of fruit and vegetables. Blood tests for lipids and cholesterol should begin at age 20 and be repeated every 5 years. If your lipid or cholesterol levels are high, you are over 50, or you are at high risk  for heart disease, you may need your cholesterol levels checked more frequently. Ongoing high lipid and cholesterol levels should be treated with medicines if diet and exercise are not working.  . If you smoke, find out from your health care provider how to quit. If you do not use tobacco, please do not start.  . If you choose to drink alcohol, please do not consume more than 2 drinks per day. One drink is considered to be 12 ounces (355 mL) of beer, 5 ounces (148 mL) of wine, or 1.5 ounces (44 mL) of liquor.  . If you are 60-18 years old, ask your  health care provider if you should take aspirin to prevent strokes.  . Use sunscreen. Apply sunscreen liberally and repeatedly throughout the day. You should seek shade when your shadow is shorter than you. Protect yourself by wearing long sleeves, pants, a wide-brimmed hat, and sunglasses year round, whenever you are outdoors.  . Once a month, do a whole body skin exam, using a mirror to look at the skin on your back. Tell your health care provider of new moles, moles that have irregular borders, moles that are larger than a pencil eraser, or moles that have changed in shape or color.

## 2017-04-13 NOTE — Progress Notes (Signed)
Patient seen at [facility] visit today.  B, L>R, itchy rash on the shins.  Not responding to OTC tx.  Blanching irregular rash on the L>R shin w/o ulceration. D/w pt about options.  She can try TAC cream and update me as needed.   She agrees.  Elsie Stain  04/13/17 2:07 PM

## 2017-05-21 ENCOUNTER — Other Ambulatory Visit: Payer: Self-pay | Admitting: Family Medicine

## 2017-06-21 ENCOUNTER — Encounter: Payer: Self-pay | Admitting: Family Medicine

## 2017-06-21 ENCOUNTER — Ambulatory Visit (INDEPENDENT_AMBULATORY_CARE_PROVIDER_SITE_OTHER): Payer: PPO | Admitting: Family Medicine

## 2017-06-21 DIAGNOSIS — G8929 Other chronic pain: Secondary | ICD-10-CM | POA: Diagnosis not present

## 2017-06-21 DIAGNOSIS — M545 Low back pain: Secondary | ICD-10-CM

## 2017-06-21 MED ORDER — HYDROCODONE-ACETAMINOPHEN 5-325 MG PO TABS: 1.0000 | ORAL_TABLET | Freq: Four times a day (QID) | ORAL | 0 refills | Status: DC | PRN

## 2017-06-21 NOTE — Patient Instructions (Addendum)
Recheck in about 3 months.  Routine pain follow up.  Update me as needed in the meantime.  Take care.  Glad to see you.

## 2017-06-21 NOTE — Progress Notes (Signed)
She had a good birthday last week, d/w pt.  Her grandson did well in a baseball tournament recently.  She was able to travel to the games.    No recent use of SABA.  Still on anoro at baseline.    Indication for chronic opioid: Chronic back pain, at baseline.   Medication and dose: hydrocodone5/325 # pills per month: 120 Last UDS date: 08/15/16 Pain contract signed (Y/N): yes Date narcotic database last reviewed (include red flags):06/21/17  Pain inventory (1-10) Average pain:  "I'm doing pretty good but sometimes I have to back off and sit down."  Pain now: "not to bad right now" with dose of med prior to [place].   Pain isburning in the lower back and her legs. Pain is worse when due for med, early AM, if prolonged sitting. Relief from meds:yes, seen above.  Gabapentin is helping some. She clearly does better with concurrent gabapentin and hydrocodone.  She does not do as well with pain control when she is taking the medication separately.  This is unchanged.    In the last 24 hours, how much has pain interfered with the following (1-10 greatest interference)? General activity- "I couldn't get up without the medicine" she affirms again.     Relationships with others- still able to function with pain med Enjoyment of life- is able to function with pain med What time of the day is the pain the worst- AM. Sleep is described by patient as"not good" from pain and from longstanding insomnia. She has waking in the middle of the night, partly related to pain.  she is putting up with that.    Mobility/function Assistance device:no How many minutes can you walk:she can walk for 15 minutes Able to climb steps:yes, with effort. Driving:yes Disabled:no  Bowel or bladder symptoms:no Mood:"I'm okay as long as somebody doesn't get on my nerves."  This is her baseline.  She smiled as she said that.    Physicians involved in care:Dr. Elsworth Soho with pulmonary. Any changes  since last visit?No   Meds, vitals, and allergies reviewed.   ROS: Per HPI unless specifically indicated in ROS section   GEN: nad, alert and oriented HEENT: mucous membranes moist NECK: supple w/o LA CV: rrr PULM: ctab, no inc wob ABD: soft, +bs Lower midline back ttp as expected, at baseline.  No flank pain.  No CVA pain.  Able to bear weight. L SLR positive.  She has altered but not absent sensation in the BLE at baseline.

## 2017-06-22 NOTE — Assessment & Plan Note (Signed)
Indication for chronic opioid: Chronic back pain, at baseline.   Medication and dose: hydrocodone5/325 # pills per month: 120 Last UDS date: 08/15/16 Pain contract signed (Y/N): yes Date narcotic database last reviewed (include red flags):06/21/17  Would continue as is with current medications.  No adverse effect on medications.  Her functional status is improved with current meds.  Discussed with patient.  She agrees.  Recheck in about 3 months.

## 2017-06-22 NOTE — Assessment & Plan Note (Signed)
Indication for chronic opioid: Chronic back pain, at baseline.   Medication and dose: hydrocodone5/325 # pills per month: 120 Last UDS date: 08/15/16 Pain contract signed (Y/N): yes Date narcotic database last reviewed (include red flags):06/21/17

## 2017-07-16 ENCOUNTER — Telehealth: Payer: Self-pay | Admitting: Family Medicine

## 2017-07-16 ENCOUNTER — Other Ambulatory Visit: Payer: Self-pay | Admitting: Family Medicine

## 2017-07-16 NOTE — Telephone Encounter (Signed)
Electronic refill request. Gabapentin Last office visit:   06/21/17 Last Filled:     270 tablet 1 01/16/2017  Please advise.

## 2017-07-16 NOTE — Telephone Encounter (Signed)
Copied from [place] [id number]. Topic: Quick Communication - See Telephone Encounter >> Jul 16, 2017 12:32 PM Vernona Rieger wrote: CRM for notification. See Telephone encounter for: 07/16/17.  HYDROcodone-acetaminophen (NORCO/VICODIN) 5-325 MG tablet  [facility] - [place], [place] - [place]

## 2017-07-16 NOTE — Telephone Encounter (Signed)
Left message for pt to contact pharmacy for refill of Hydrocodone. Prescription was sent to the pharmacy previously and can be filled on 07/20/17.

## 2017-07-17 NOTE — Telephone Encounter (Signed)
Sent. Thanks.   

## 2017-07-30 ENCOUNTER — Telehealth: Payer: Self-pay | Admitting: Family Medicine

## 2017-07-30 NOTE — Telephone Encounter (Signed)
Best number [phone] Pt walked in wanting to get an appointment with you on Thursday for left hip pain.  Only appointments left were same day can I use one of those Offered another provider pt refused

## 2017-07-31 NOTE — Telephone Encounter (Signed)
Left message asking pt to call office  °

## 2017-07-31 NOTE — Telephone Encounter (Signed)
Appointment 8/1

## 2017-07-31 NOTE — Telephone Encounter (Signed)
Okay.  Thanks.

## 2017-08-02 ENCOUNTER — Encounter: Payer: Self-pay | Admitting: Family Medicine

## 2017-08-02 ENCOUNTER — Ambulatory Visit (INDEPENDENT_AMBULATORY_CARE_PROVIDER_SITE_OTHER): Payer: PPO | Admitting: Family Medicine

## 2017-08-02 DIAGNOSIS — M719 Bursopathy, unspecified: Secondary | ICD-10-CM

## 2017-08-02 MED ORDER — METOPROLOL TARTRATE 25 MG PO TABS: ORAL_TABLET | ORAL | 3 refills | Status: DC

## 2017-08-02 MED ORDER — PREDNISONE 20 MG PO TABS: ORAL_TABLET | ORAL | 0 refills | Status: DC

## 2017-08-02 NOTE — Patient Instructions (Signed)
Likely bursitis on top of back pain.  Heat or ice for the back.  Ice for the hip bursa.  5 minutes at a time on the ice.   Try prednisone with food.  Update me as needed.

## 2017-08-02 NOTE — Progress Notes (Signed)
L lower back pain, L hip pain.  Started about 4 days ago.  No trigger.  No trauma.  No R sided sx.  This isn't her typical pain.  L leg feels weaker from the pain but she can still walk on it.  No dysuria.  No numbness but "it doesn't feel right" meaning abnormal but not absent sensation.   Meds, vitals, and allergies reviewed.   ROS: Per HPI unless specifically indicated in ROS section   nad ncat rrr ctab abd soft Not ttp  Back not ttp in midline, no cva pain, L lower lateral back ttp L SLR neg L greater troch ttp L hip normal ROM.  Able to bear weight.

## 2017-08-03 DIAGNOSIS — M719 Bursopathy, unspecified: Secondary | ICD-10-CM | POA: Insufficient documentation

## 2017-08-03 HISTORY — DX: Bursopathy, unspecified: M71.9

## 2017-08-03 NOTE — Assessment & Plan Note (Signed)
Likely bursitis in addition to lower back pain that is likely musculoskeletal in origin.  D/w pt about anatomy and options.  Heat or ice for the back.  Ice for the hip bursa.  5 minutes at a time on the ice.   Try prednisone with food.  Update me as needed.   Routine cautions given.  She agrees.  Okay for outpatient follow-up.

## 2017-09-21 ENCOUNTER — Other Ambulatory Visit: Payer: Self-pay | Admitting: Family Medicine

## 2017-09-21 NOTE — Telephone Encounter (Signed)
Name of Medication: Hydrocodone Name of Pharmacy: [facility] or Written Date and Quantity:   120 tablet 0 06/21/2017  Last Office Visit and Type: 08/02/17 Acute Next Office Visit and Type: 10/22/17 CPE Last Controlled Substance Agreement Date: 10/19/14 Last UDS: 08/15/16

## 2017-09-23 NOTE — Telephone Encounter (Signed)
Sent.  [place] database appropriate.  She has OV scheduled 10/21 with me.  Please cancel that.   Keep the appointments on 10/22 and 10/26.   Thanks.

## 2017-09-24 NOTE — Telephone Encounter (Signed)
Please notify patient about appointments.

## 2017-09-25 NOTE — Telephone Encounter (Signed)
Dr Damita Dunnings I wanted to double check with you to make sure that you saw that October 22 and 26 appointments are for 2020 year not this year. Should patient keep appointment for this year still?  I did have to reschedule her physical appointments for patient because the one that was made for 10/22/17 was on a Monday and patient can not do Mondays.  If this message is confusing please let me know. Thank Edrick Kins, RMA

## 2017-09-25 NOTE — Telephone Encounter (Signed)
I apologize.  Thanks for your note.  She is scheduled now for 11/06/17 and then for 11/2018.  Please keep as is.  Thanks.

## 2017-09-26 NOTE — Telephone Encounter (Signed)
Called patient and left a detailed message on voicemail machine, ok per DPR on file-Megan Peterson V Tadan Shill, RMA

## 2017-10-22 ENCOUNTER — Encounter: Payer: PPO | Admitting: Family Medicine

## 2017-10-31 ENCOUNTER — Other Ambulatory Visit: Payer: Self-pay | Admitting: Family Medicine

## 2017-10-31 NOTE — Telephone Encounter (Signed)
Sent. Thanks.  She has f/u pending.  

## 2017-10-31 NOTE — Telephone Encounter (Signed)
Name of Medication: Hydrocodone Name of Pharmacy: [facility] or Written Date and Quantity: 09/23/17 #120 Last Office Visit and Type: 08/02/17 Next Office Visit and Type: 11/06/2017 Last Controlled Substance Agreement Date: 08/15/16 Last UDS:08/15/16

## 2017-11-06 ENCOUNTER — Ambulatory Visit (INDEPENDENT_AMBULATORY_CARE_PROVIDER_SITE_OTHER): Payer: PPO | Admitting: Family Medicine

## 2017-11-06 ENCOUNTER — Encounter: Payer: Self-pay | Admitting: Family Medicine

## 2017-11-06 VITALS — BP 140/74 | HR 79 | Temp 98.1°F | Ht <= 58 in | Wt 124.8 lb

## 2017-11-06 DIAGNOSIS — Z780 Asymptomatic menopausal state: Secondary | ICD-10-CM

## 2017-11-06 DIAGNOSIS — Z7189 Other specified counseling: Secondary | ICD-10-CM

## 2017-11-06 DIAGNOSIS — E785 Hyperlipidemia, unspecified: Secondary | ICD-10-CM

## 2017-11-06 DIAGNOSIS — G8929 Other chronic pain: Secondary | ICD-10-CM

## 2017-11-06 DIAGNOSIS — I10 Essential (primary) hypertension: Secondary | ICD-10-CM

## 2017-11-06 DIAGNOSIS — J479 Bronchiectasis, uncomplicated: Secondary | ICD-10-CM | POA: Diagnosis not present

## 2017-11-06 DIAGNOSIS — M545 Low back pain, unspecified: Secondary | ICD-10-CM

## 2017-11-06 DIAGNOSIS — Z0000 Encounter for general adult medical examination without abnormal findings: Secondary | ICD-10-CM

## 2017-11-06 MED ORDER — DOXYCYCLINE HYCLATE 100 MG PO TABS: 100.0000 mg | ORAL_TABLET | Freq: Two times a day (BID) | ORAL | 0 refills | Status: DC

## 2017-11-06 NOTE — Progress Notes (Signed)
Health maintenance.  Flu shot deferred given cough.   PNA UTD Tetanus 2013 zostavax 2013 Colonoscopy 2008. D/w pt.  She declined f/u at this point.   Mammogram d/w pt.  Declined.  She wouldn't want intervention.   DXA d/w pt.  Ordered 2019.   Advance directive- d/w pt.  Daughter and son equally designated if patient were incapacitated.   Hypertension:    Using medication without problems or lightheadedness: yes Chest pain with exertion:no Edema:no Short of breath: occ, with cough noted. See below.    Elevated Cholesterol: Using medications without problems:yes Muscle aches: likely not from statin.  Diet compliance: yes Exercise: limited by pain  Cough.  Worse in the last week or so.  Now into the 2nd week.  Some sputum, thick, yellow/green.  No fevers.  No vomiting.  Still using anoro.  No use of SABA.  No known wheeze.    Indication for chronic opioid: Chronic back pain, at baseline.   Medication and dose: hydrocodone5/325 # pills per month: 120 Last UDS date: 08/15/16 Pain contract signed (Y/N): yes Date narcotic database last reviewed (include red flags):11/06/17  Pain inventory (1-10) Average pain:  "I'm doing pretty good most of the time, depending on what I'm doing" Pain now: "alright right now" with dose of med prior to [place].  Pain isburning in the lower back and her legs. Pain is worse when due for med, early AM, if prolonged sitting. Relief from meds:yes, seen above.  Gabapentin is helping some. She clearly does better with concurrent gabapentin and hydrocodone.She does not do as well with pain control when she is taking the medication separately.  This is unchanged.  d/w pt.    In the last 24 hours, how much has pain interfered with the following (1-10 greatest interference)? General activity- "I couldn't get up without the medicine" she affirms again.    Relationships with others- still able to function with pain med Enjoyment of life- is able to  function with pain med What time of the day is the pain the worst- AM. Sleep is described by patient as"not good" from pain and from longstanding insomnia. She has waking in the middle of the night, partly related to pain. she is putting up with that.    Mobility/function Assistance device:no How many minutes can you walk:shorter now from cough.   Able to climb steps:yes, with effort. Driving:yes Disabled:no  Bowel or bladder symptoms:no Mood:"I'm okay as long as somebody doesn't get on my nerves." This is her baseline.  She smiled as she said that.   Physicians involved in care:Dr. Elsworth Soho with pulmonary. Any changes since last visit?No    Meds, vitals, and allergies reviewed.   ROS: Per HPI unless specifically indicated in ROS section   GEN: nad, alert and oriented HEENT: mucous membranes moist NECK: supple w/o LA CV: rrr PULM: ctab, no inc wob, occ cough noted.   ABD: soft, +bs Lower midline back ttp as expected, at baseline.No flank pain. No CVA pain. Able to bear weight. L SLR positive.  She has altered but not absent sensation in the BLE at baseline.

## 2017-11-06 NOTE — Patient Instructions (Addendum)
We will call about your referral.  Megan Peterson or Megan Peterson will call you if you don't see one of them on the way out.   Start doxycycline and continue anoro.  Use albuterol if needed.    Go to the lab on the way out.  We'll contact you with your lab report.  Take care.  Glad to see you.   Plan on recheck in about 3-4 months, sooner if needed.

## 2017-11-07 NOTE — Assessment & Plan Note (Signed)
Indication for chronic opioid: Chronic back pain, at baseline.   Medication and dose: hydrocodone5/325 # pills per month: 120 Last UDS date: 08/15/16 Pain contract signed (Y/N): yes Date narcotic database last reviewed (include red flags):11/06/17  Likely reasonable continue current medications as is.  No sedation.  Update me as needed.  See above.  At this point benefit of medication likely outweighs risk of use.

## 2017-11-07 NOTE — Assessment & Plan Note (Signed)
Able to tolerate statin.  Continue as is.  Previous labs discussed with patient.

## 2017-11-07 NOTE — Assessment & Plan Note (Signed)
Flu shot deferred given cough.   PNA UTD Tetanus 2013 zostavax 2013 Colonoscopy 2008. D/w pt.  She declined f/u at this point.   Mammogram d/w pt.  Declined.  She wouldn't want intervention.   DXA d/w pt.  Ordered 2019.   Advance directive- d/w pt.  Daughter and son equally designated if patient were incapacitated.

## 2017-11-07 NOTE — Assessment & Plan Note (Signed)
With cough worse in the last week or so.  With sputum.  Continue using baseline inhaler with occasional as needed use of albuterol.  Start doxycycline.  Update me if not better.  She agrees.  Still okay for outpatient follow-up.

## 2017-11-07 NOTE — Assessment & Plan Note (Signed)
Advance directive- d/w pt.  Daughter and son equally designated if patient were incapacitated.

## 2017-11-07 NOTE — Assessment & Plan Note (Signed)
Reasonable control, especially given her baseline level of pain.  Continue current medications.  She agrees.

## 2017-11-10 LAB — PAIN MGMT, PROFILE 8 W/CONF, U
6 Acetylmorphine: NEGATIVE ng/mL (ref <10)
ALCOHOL METABOLITES: NEGATIVE ng/mL (ref <500)
AMPHETAMINES: NEGATIVE ng/mL (ref <500)
Benzodiazepines: NEGATIVE ng/mL (ref <100)
Buprenorphine, Urine: NEGATIVE ng/mL (ref <5)
COCAINE METABOLITE: NEGATIVE ng/mL (ref <150)
CODEINE: NEGATIVE ng/mL (ref <50)
CREATININE: 59.8 mg/dL
Hydrocodone: 1067 ng/mL — ABNORMAL HIGH (ref <50)
Hydromorphone: 283 ng/mL — ABNORMAL HIGH (ref <50)
MDMA: NEGATIVE ng/mL (ref <500)
MORPHINE: NEGATIVE ng/mL (ref <50)
Marijuana Metabolite: NEGATIVE ng/mL (ref <20)
Norhydrocodone: 1946 ng/mL — ABNORMAL HIGH (ref <50)
OPIATES: POSITIVE ng/mL — AB (ref <100)
OXIDANT: NEGATIVE ug/mL (ref <200)
OXYCODONE: NEGATIVE ng/mL (ref <100)
PH: 5.79 (ref 4.5–9.0)

## 2017-11-16 ENCOUNTER — Telehealth: Payer: Self-pay | Admitting: Family Medicine

## 2017-11-16 ENCOUNTER — Ambulatory Visit (INDEPENDENT_AMBULATORY_CARE_PROVIDER_SITE_OTHER): Payer: PPO | Admitting: Family Medicine

## 2017-11-16 ENCOUNTER — Encounter: Payer: Self-pay | Admitting: Family Medicine

## 2017-11-16 DIAGNOSIS — J479 Bronchiectasis, uncomplicated: Secondary | ICD-10-CM | POA: Diagnosis not present

## 2017-11-16 DIAGNOSIS — R21 Rash and other nonspecific skin eruption: Secondary | ICD-10-CM

## 2017-11-16 DIAGNOSIS — E2839 Other primary ovarian failure: Secondary | ICD-10-CM

## 2017-11-16 MED ORDER — AMOXICILLIN-POT CLAVULANATE 875-125 MG PO TABS: 1.0000 | ORAL_TABLET | Freq: Two times a day (BID) | ORAL | 0 refills | Status: DC

## 2017-11-16 MED ORDER — FLUOCINONIDE 0.05 % EX CREA: 1.0000 "application " | TOPICAL_CREAM | Freq: Two times a day (BID) | CUTANEOUS | 0 refills | Status: DC | PRN

## 2017-11-16 NOTE — Progress Notes (Signed)
Still with itchy rash on the B shin, worse at night.  No help with TAC.    Cough.  Still with productive cough, yellowish, thinner sometimes, but not cleared.  She doesn't feel unwell.  H/o bronchiectasis.   Still using SABA prn, rarely.    Meds, vitals, and allergies reviewed.   ROS: Per HPI unless specifically indicated in ROS section   GEN: nad, alert and oriented HEENT: mucous membranes moist, tm w/o erythema, nasal exam w/o erythema, clear discharge noted,  OP with cobblestoning NECK: supple w/o LA CV: rrr.   PULM: ctab except for B scattered rhonchi, no inc wob, cough noted.  Not SOB.  EXT: no edema SKIN: Blanching superficial rash on the B shins, does not appear infected.

## 2017-11-16 NOTE — Telephone Encounter (Signed)
Called to schedule bone density.  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

## 2017-11-16 NOTE — Patient Instructions (Addendum)
Use lidex in the meantime on the rash.  If not any better, then let me know.  Start augmentin and use albuterol daily for the next few days.  See if that helps with sputum clearance.  Update [place] as needed.   Take care.  Glad to see you.

## 2017-11-18 DIAGNOSIS — R21 Rash and other nonspecific skin eruption: Secondary | ICD-10-CM

## 2017-11-18 HISTORY — DX: Rash and other nonspecific skin eruption: R21

## 2017-11-18 NOTE — Assessment & Plan Note (Signed)
She is likely having more trouble clearing sputum due to bronchiectasis.  Discussed increasing frequency of albuterol use.  Start Augmentin in the meantime.  She would like to avoid prednisone and this is reasonable since she is not wheezing on exam.  Update me as needed.  She agrees.

## 2017-11-18 NOTE — Assessment & Plan Note (Signed)
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  Try fluocinonide and then update me as needed.  If no response to fluocinonide she knows not to continue the medication.  Routine steroid cream cautions given.  Does not appear infected.  Update me as needed.

## 2017-11-18 NOTE — Telephone Encounter (Signed)
Reordered with estrogen deficiency as the dx.  I removed the old order.  Thanks.

## 2017-11-22 ENCOUNTER — Telehealth: Payer: Self-pay | Admitting: *Deleted

## 2017-11-22 NOTE — Telephone Encounter (Signed)
Pt aware.

## 2017-11-22 NOTE — Telephone Encounter (Signed)
Breast center 1/20

## 2017-11-22 NOTE — Telephone Encounter (Signed)
Pharmacy called triage and advise me that pt's PA was approved but the pharmacist said someone from our office told her that if the PA was approved that the pharmacy would refund her the $$ she paid for the med. Pt picked up Rx on 11/17/17 and PA was approved from today. Pharmacy said the only way they can refund pt's $$ is if we call insurance company and request the PA to be approved with a starting date of 11/17/17

## 2017-11-22 NOTE — Telephone Encounter (Signed)
Patient advised to call her insurance company and explain that she had already picked up the Rx before the PA was approved and see if they can help to get the PA approved on the day of pickup.

## 2017-11-22 NOTE — Telephone Encounter (Signed)
PA submitted thru [facility] for Fluocinonide Cream, awaiting response.

## 2017-11-25 IMAGING — US US CAROTID DUPLEX BILAT
1 series · 13 of 24 positions shown · non-contrast
Comparison: None.

CLINICAL DATA: TIAs

EXAM:
BILATERAL CAROTID DUPLEX ULTRASOUND
TECHNIQUE: Gray scale imaging, color Doppler and duplex ultrasound were
performed of bilateral carotid and vertebral arteries in the neck.

[Series 1: us carotid duplex bilat · 0.07mm/px · 13 of 68 slices shown]
[im 1/68]
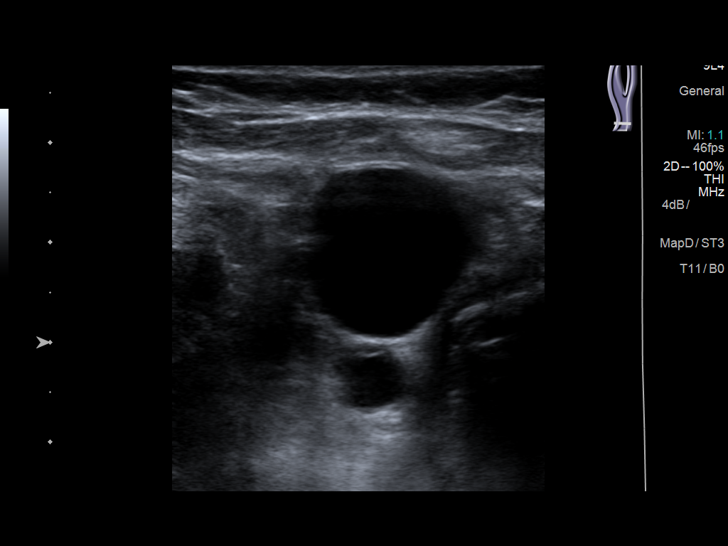
[im 6/68]
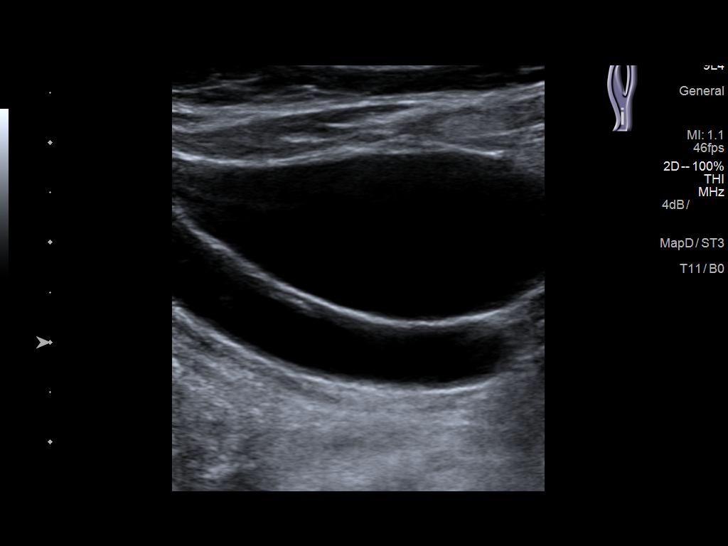
[im 12/68]
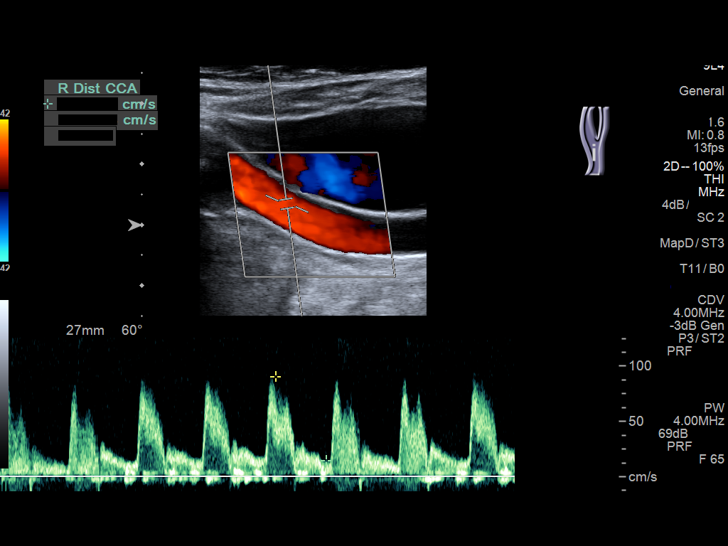
[im 18/68]
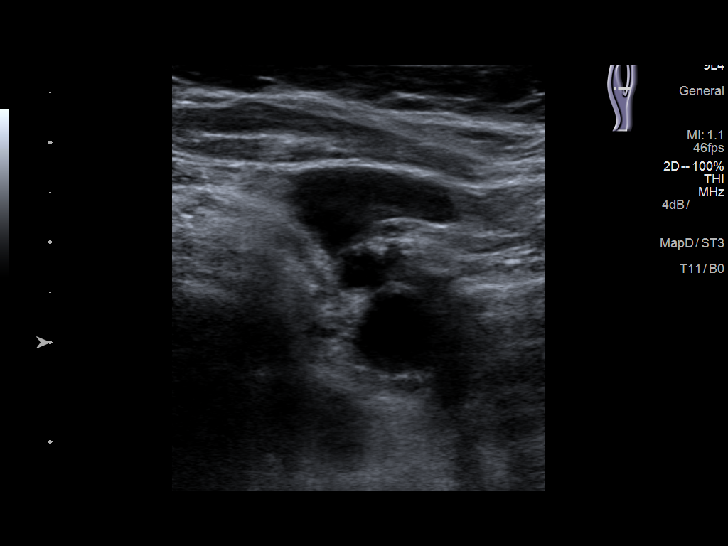
[im 24/68]
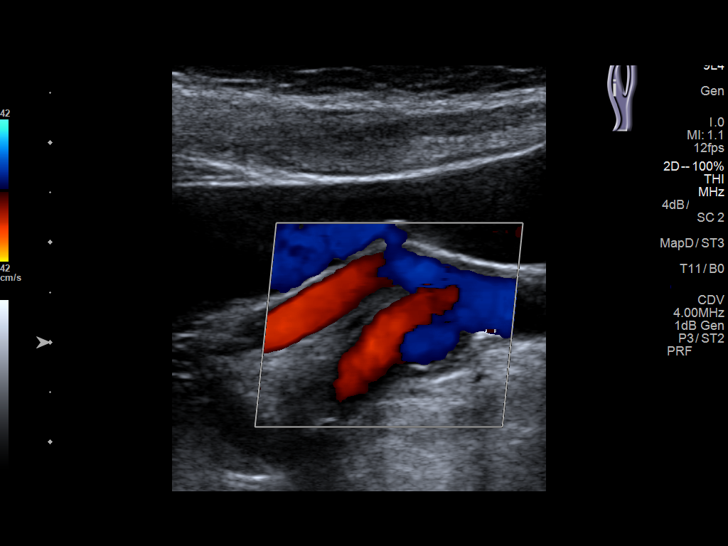
[im 30/68]
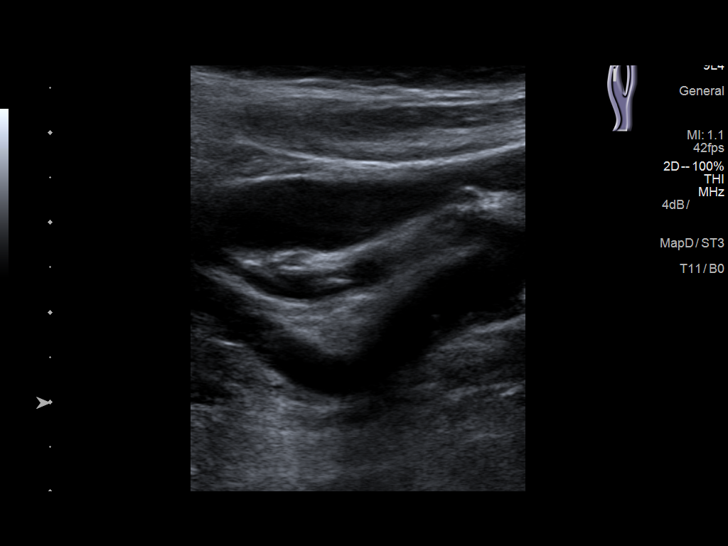
[im 35/68]
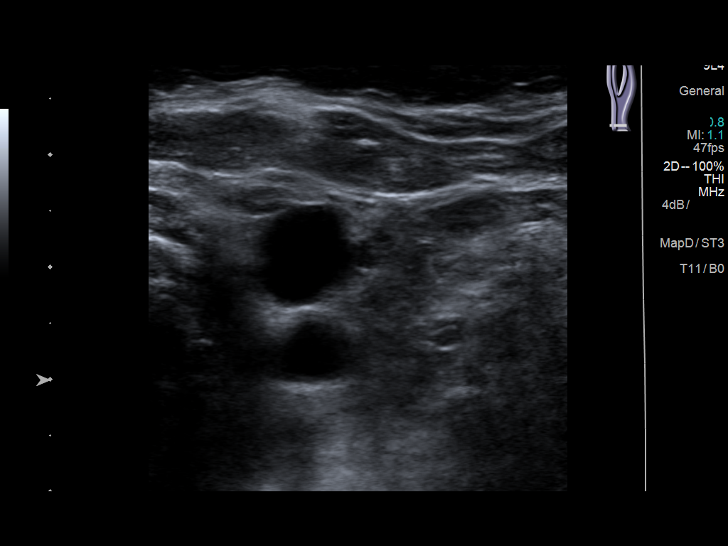
[im 38/68]
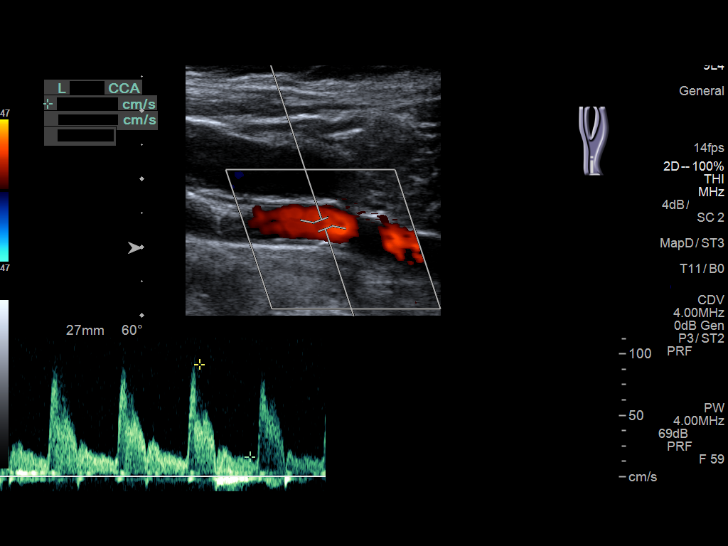
[im 44/68]
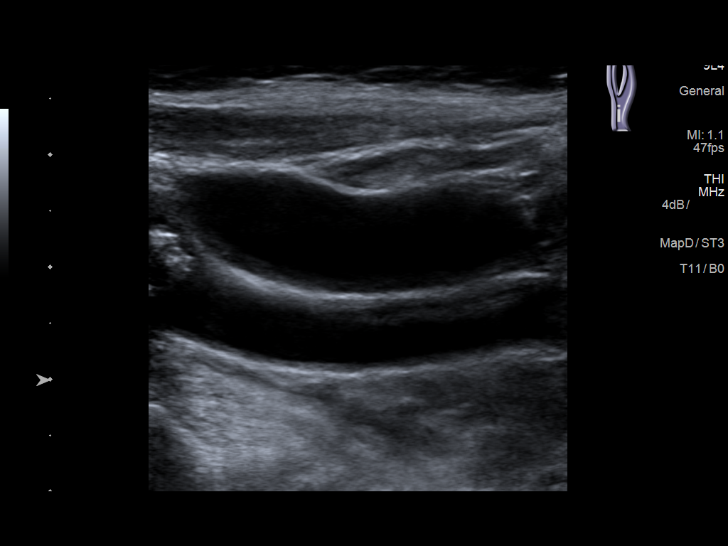
[im 50/68]
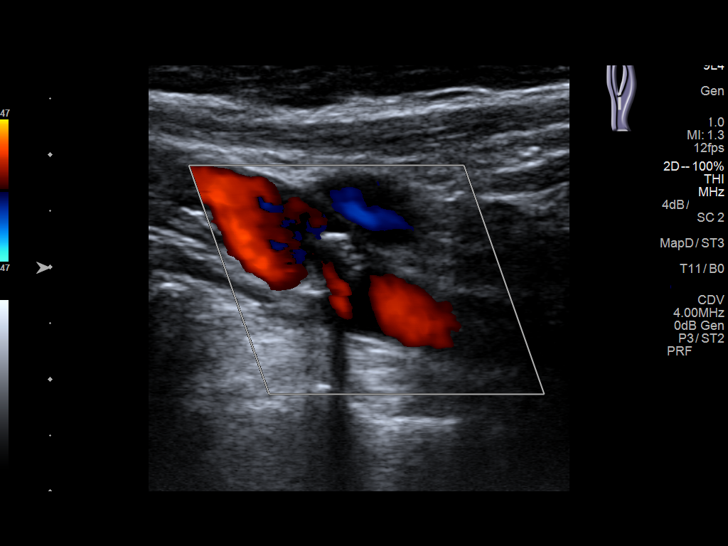
[im 56/68]
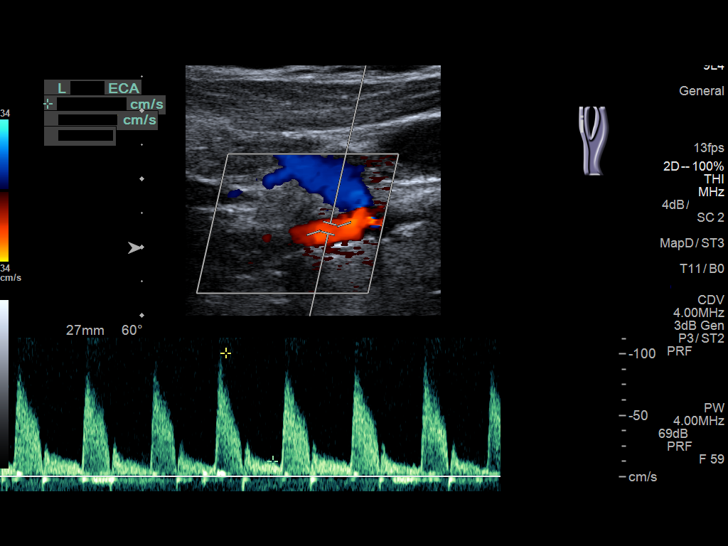
[im 62/68]
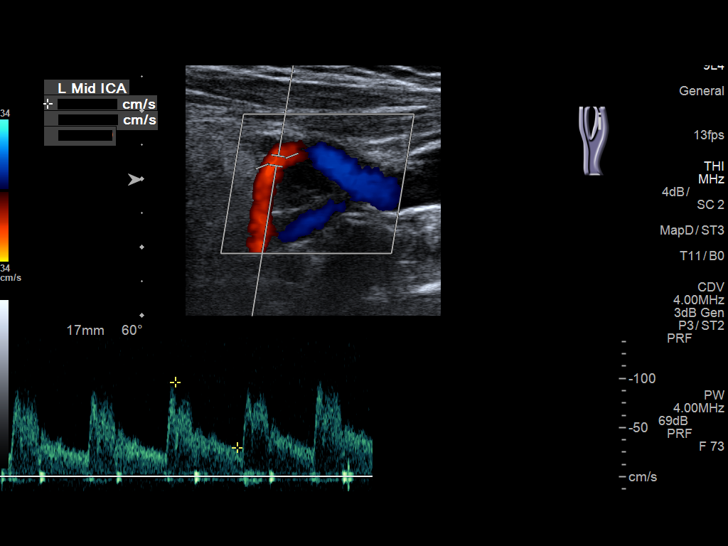
[im 68/68]
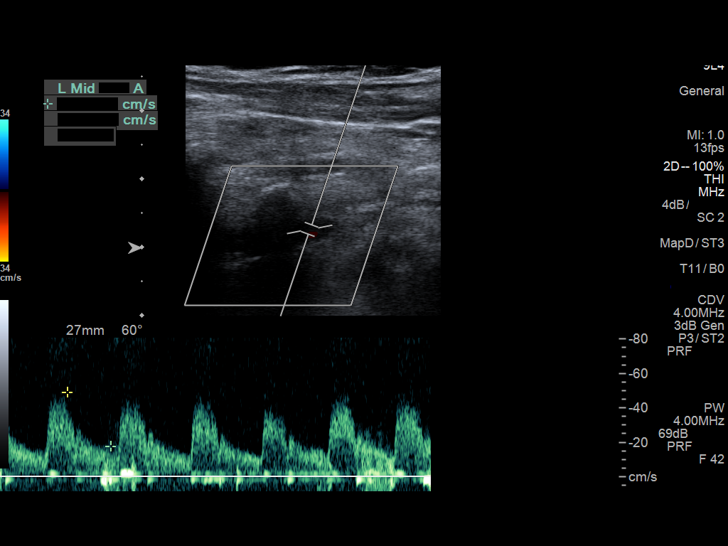

[13 of 24 positions shown; findings below may reference images not displayed]

FINDINGS: Criteria: Quantification of carotid stenosis is based on velocity
parameters that correlate the residual internal carotid diameter
with NASCET-based stenosis levels, using the diameter of the distal
internal carotid lumen as the denominator for stenosis measurement.

The following velocity measurements were obtained:

RIGHT

ICA:  109/14 cm/sec

CCA:  91/15 cm/sec

SYSTOLIC ICA/CCA RATIO:

DIASTOLIC ICA/CCA RATIO:

ECA:  110 cm/sec

LEFT

ICA:  114/23 cm/sec

CCA:  87/50 cm/sec

SYSTOLIC ICA/CCA RATIO:

DIASTOLIC ICA/CCA RATIO:

ECA:  101 cm/sec

RIGHT CAROTID ARTERY: Preliminary grayscale images demonstrate some
mild intimal thickening within the common carotid artery. Mild
calcified plaque is noted in the carotid bulb. The waveforms,
velocities and flow velocity ratios however demonstrate no evidence
of focal hemodynamically significant stenosis.

RIGHT VERTEBRAL ARTERY:  Antegrade in nature.

LEFT CAROTID ARTERY: Preliminary grayscale images demonstrate mild
intimal thickening within the common carotid artery. Mild calcific
plaque is noted in the region of the carotid bulb. The waveforms,
velocities and flow velocity ratios show no evidence of focal
hemodynamically significant stenosis.

LEFT VERTEBRAL ARTERY:  Antegrade in nature.
IMPRESSION: Mild bilateral calcified plaque without focal hemodynamically
significant stenosis.

## 2017-12-06 ENCOUNTER — Other Ambulatory Visit: Payer: Self-pay | Admitting: *Deleted

## 2017-12-06 DIAGNOSIS — G8929 Other chronic pain: Secondary | ICD-10-CM

## 2017-12-06 NOTE — Telephone Encounter (Addendum)
Name of Medication: Hydrocodone Name of Pharmacy: [facility] or Written Date and Quantity:  120 tablet 0 06/21/2017  Last Office Visit and Type: 11/16/17 Acute Next Office Visit and Type: 11/12/2018 [facility] Last Controlled Substance Agreement Date: 10/19/14 Last UDS: 08/15/16

## 2017-12-07 ENCOUNTER — Other Ambulatory Visit: Payer: Self-pay | Admitting: Family Medicine

## 2017-12-07 NOTE — Telephone Encounter (Signed)
Electronic refill request. Hydrocodone Last office visit:   11/16/17 Last Filled:    120 tablet 0 06/21/2017  Please advise.

## 2017-12-09 MED ORDER — HYDROCODONE-ACETAMINOPHEN 5-325 MG PO TABS: 1.0000 | ORAL_TABLET | Freq: Four times a day (QID) | ORAL | 0 refills | Status: DC | PRN

## 2017-12-09 NOTE — Telephone Encounter (Signed)
Indication for chronic opioid: Chronic back pain, at baseline. Medication and dose: hydrocodone5/325 # pills per month: 120 Last UDS date: 11/06/17 Pain contract signed (Y/N): yes Date narcotic database last reviewed (include red flags):11/06/17  Sent. Thanks.

## 2017-12-09 NOTE — Telephone Encounter (Signed)
See other message

## 2018-01-04 ENCOUNTER — Encounter: Payer: Self-pay | Admitting: Family Medicine

## 2018-01-04 ENCOUNTER — Ambulatory Visit (INDEPENDENT_AMBULATORY_CARE_PROVIDER_SITE_OTHER): Payer: PPO | Admitting: Family Medicine

## 2018-01-04 DIAGNOSIS — R21 Rash and other nonspecific skin eruption: Secondary | ICD-10-CM | POA: Diagnosis not present

## 2018-01-04 MED ORDER — PREDNISONE 10 MG PO TABS: ORAL_TABLET | ORAL | 0 refills | Status: DC

## 2018-01-04 MED ORDER — PERMETHRIN 5 % EX CREA: 1.0000 "application " | TOPICAL_CREAM | Freq: Once | CUTANEOUS | 0 refills | Status: AC

## 2018-01-04 NOTE — Patient Instructions (Signed)
Use the cream once overnight then wash off.  Prednisone with food.  If not better, then let me know.  Take care.  Glad to see you.

## 2018-01-04 NOTE — Progress Notes (Signed)
Rash.  Itchy rash on the B arms and legs for the last few weeks.  Worse in the meantime.  No new meds that could be a trigger.  No new soaps.  No FCNAVD.  She doesn't feel sick but just itches persistently.  Fluocinonide didn't help, neither did augmentin that she was taking for other reasons.  She is really bothered by the itching.  Meds, vitals, and allergies reviewed.   ROS: Per HPI unless specifically indicated in ROS section   nad ncat OP wnl rrr ctab Bilateral irregular distribution of blanching erythematous maculopapular lesions on the extremities x4, on the arms and legs.  A few similar lesions on the trunk.  No lesions on the palms of the hands or between the fingers.  No lesions have spreading erythema consistent with cellulitis.  No vesicles.  No ulceration.  Some of the lesions appear to be linear.  No dermatomal distribution.

## 2018-01-06 NOTE — Assessment & Plan Note (Signed)
She did not respond to Augmentin or fluocinonide.  I question if she could have scabies.  Discussed options.  Presumed scabies, use prednisone for itching, start topical treatment with permethrin with routine cautions and she will update me as needed. Routine steroid cautions given.  She agrees with plan.

## 2018-01-07 ENCOUNTER — Telehealth: Payer: Self-pay | Admitting: Family Medicine

## 2018-01-07 DIAGNOSIS — R21 Rash and other nonspecific skin eruption: Secondary | ICD-10-CM

## 2018-01-07 NOTE — Telephone Encounter (Signed)
Pt called office due to being seen on 01/04/18 for a rash. Pt is requesting to be referred to a dermatologist. Pt stated she spoke with Dr.Duncan briefly about a referral.

## 2018-01-08 NOTE — Telephone Encounter (Signed)
Ordered. Thanks

## 2018-01-08 NOTE — Telephone Encounter (Signed)
Patient advised.

## 2018-01-08 NOTE — Addendum Note (Signed)
Addended by: Tonia Ghent on: 01/08/2018 06:13 AM   Modules accepted: Orders

## 2018-01-14 ENCOUNTER — Other Ambulatory Visit: Payer: Self-pay | Admitting: Family Medicine

## 2018-01-14 NOTE — Telephone Encounter (Signed)
Electronic refill request. Gabapentin Last office visit:  01/04/2018 Last Filled:    270 tablet 1 07/17/2017  Please advise.   Electronic refill request. Hydrocodone Last office visit:   01/04/2018 Last Filled:    120 tablet 0 12/09/2017  Please advise.

## 2018-01-15 NOTE — Telephone Encounter (Signed)
Sent. Thanks.   

## 2018-01-18 IMAGING — CT CT ANGIO CHEST
2 of 7 series · 19 of 46 positions shown · IV contrast (APPLIED)
Comparison: Chest radiograph earlier today and chest CT 03/19/2016

CLINICAL DATA: Shortness of breath and tachycardia.

EXAM:
CT ANGIOGRAPHY CHEST WITH CONTRAST
TECHNIQUE: Multidetector CT imaging of the chest was performed using the
standard protocol during bolus administration of intravenous
contrast. Multiplanar CT image reconstructions and MIPs were
obtained to evaluate the vascular anatomy.
CONTRAST:  75 mL Isovue 370

[Series 5: thins · axial · 0.70mm/px · z∈[-654,-389]mm · 17 of 297 slices shown]
[im 16/297  lung]
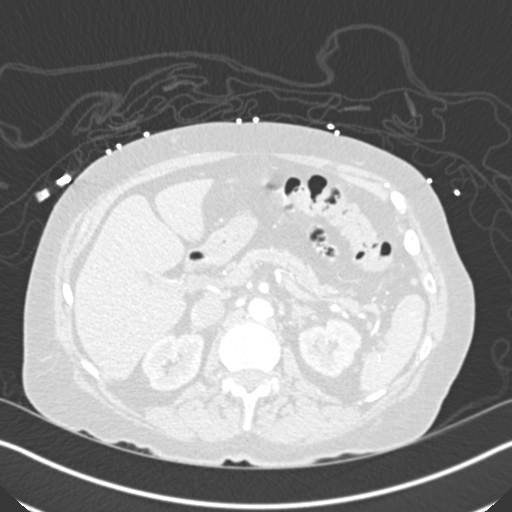
[im 32/297  soft-tissue]
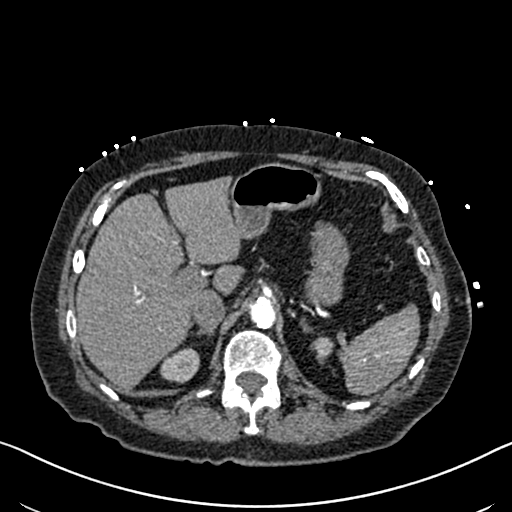
[im 47/297  lung]
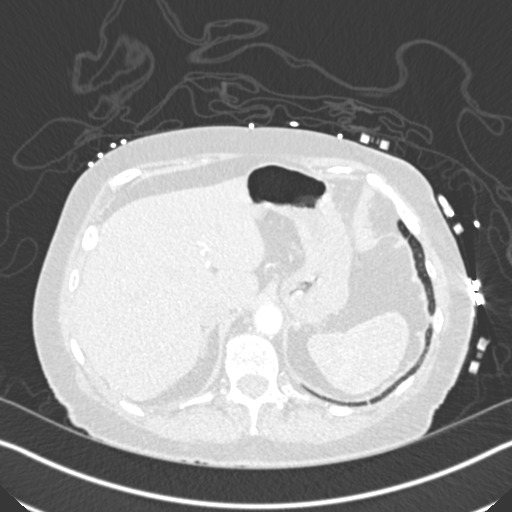
[im 63/297  soft-tissue]
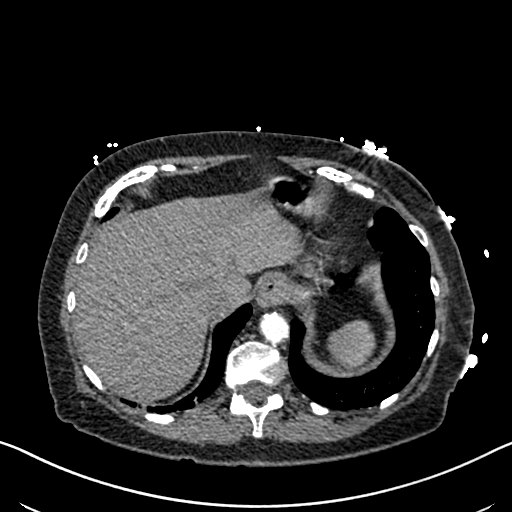
[im 78/297  lung]
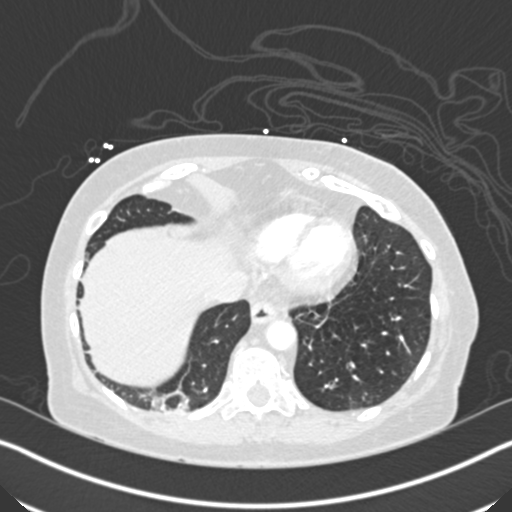
[im 94/297  soft-tissue]
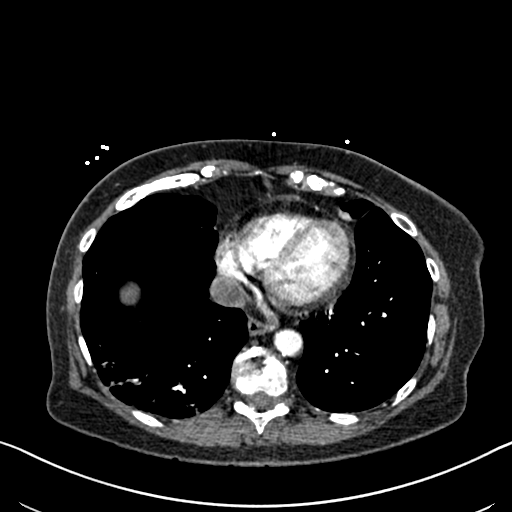
[im 110/297  lung]
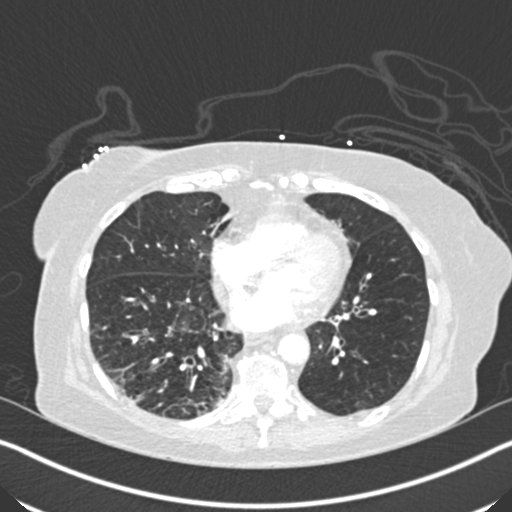
[im 125/297  soft-tissue]
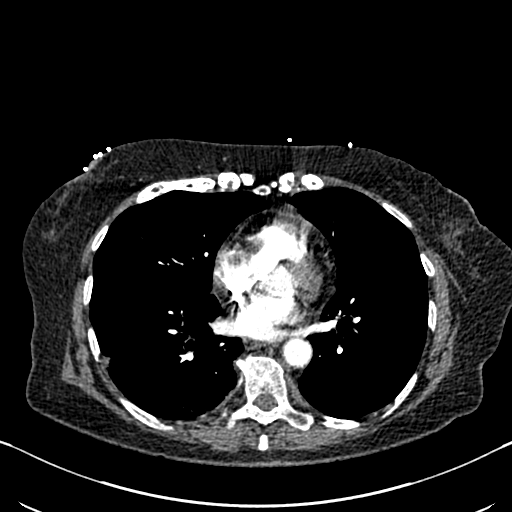
[im 156/297  lung]
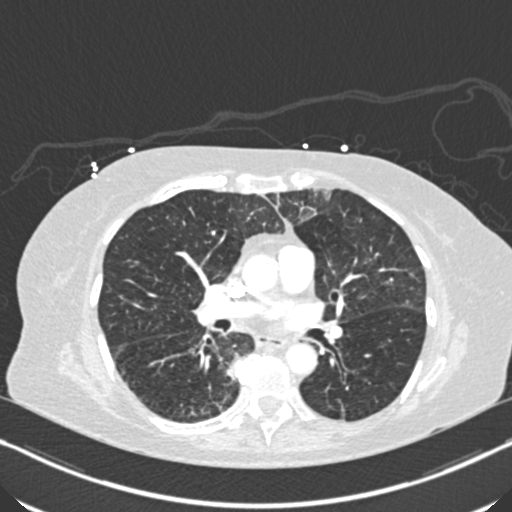
[im 172/297  soft-tissue]
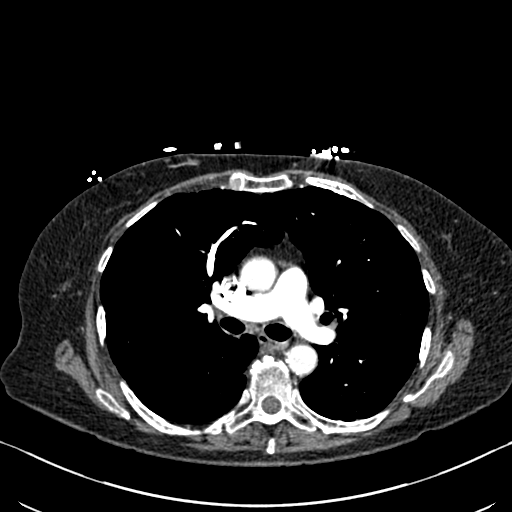
[im 187/297  lung]
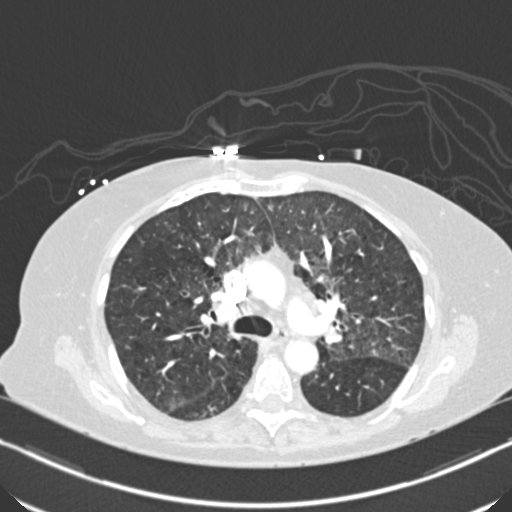
[im 203/297  soft-tissue]
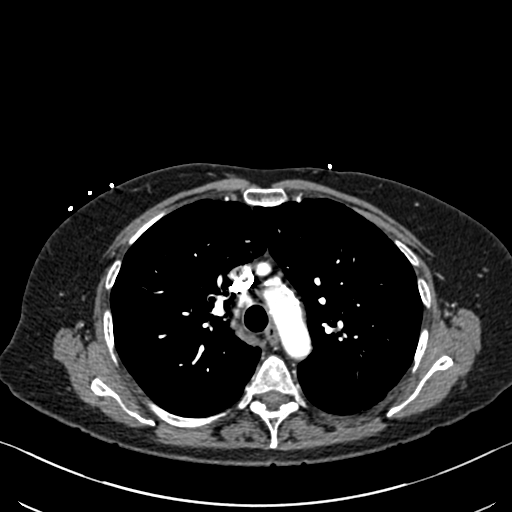
[im 219/297  lung]
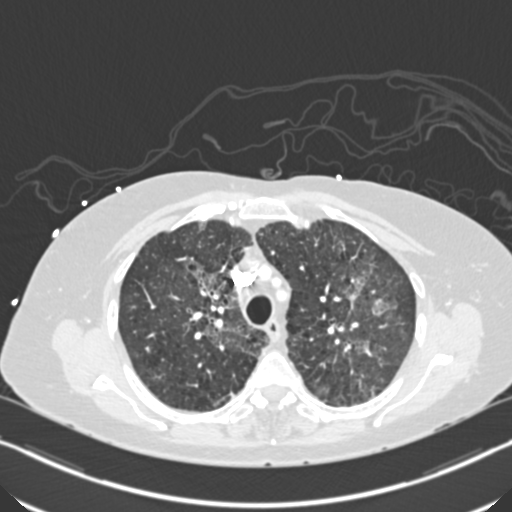
[im 234/297  soft-tissue]
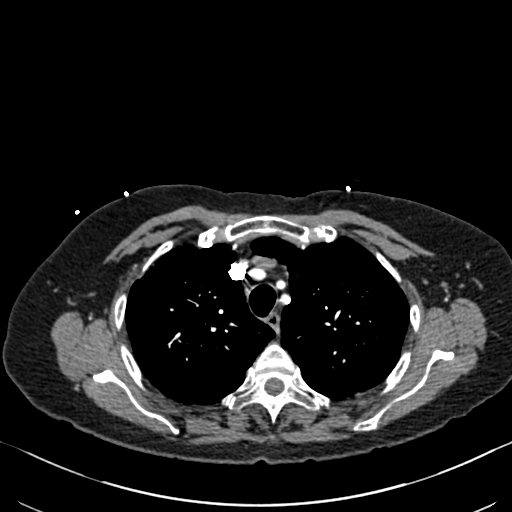
[im 250/297  lung]
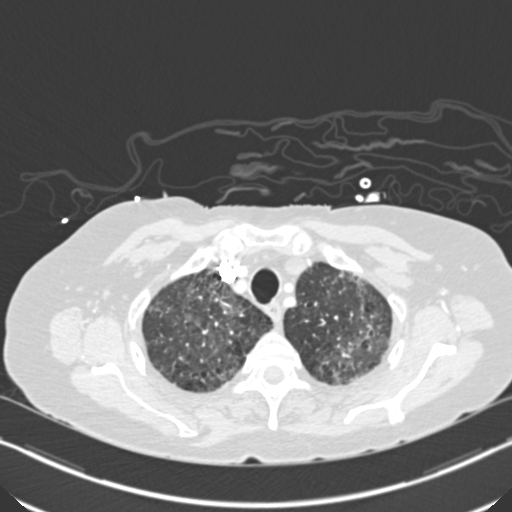
[im 265/297  soft-tissue]
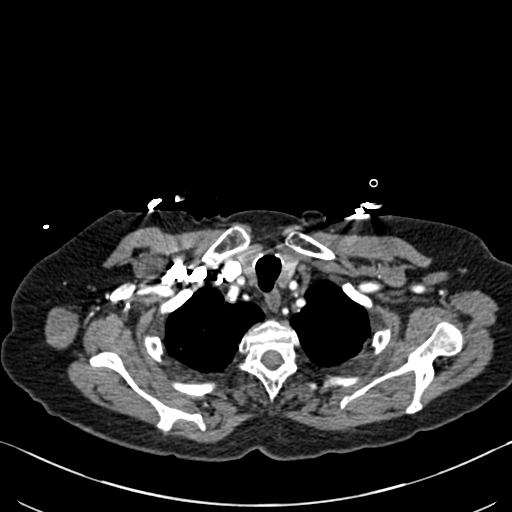
[im 281/297  lung]
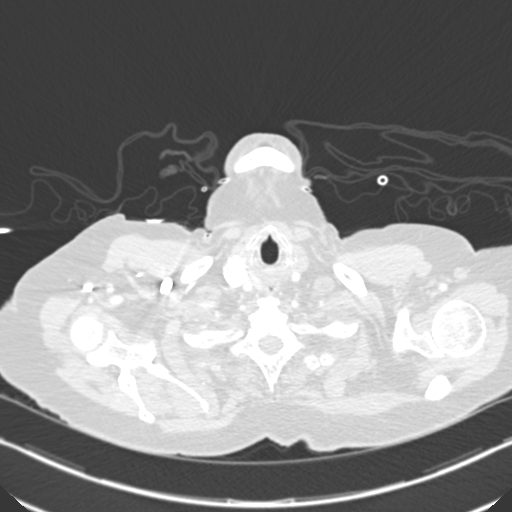

[Series 7: coronal mpr · coronal · 0.58mm/px · 2 of 73 slices shown]
[im 25/73  soft-tissue]
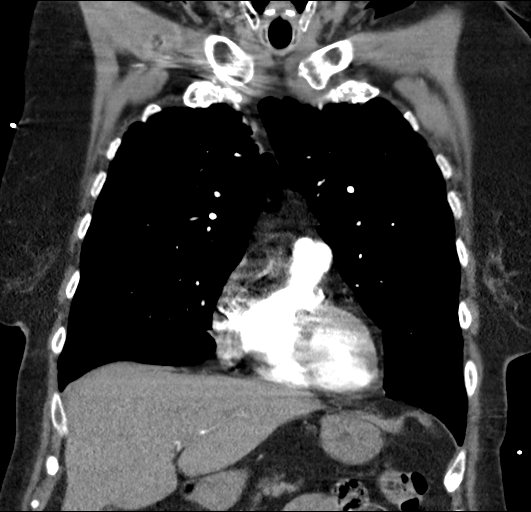
[im 49/73  soft-tissue]
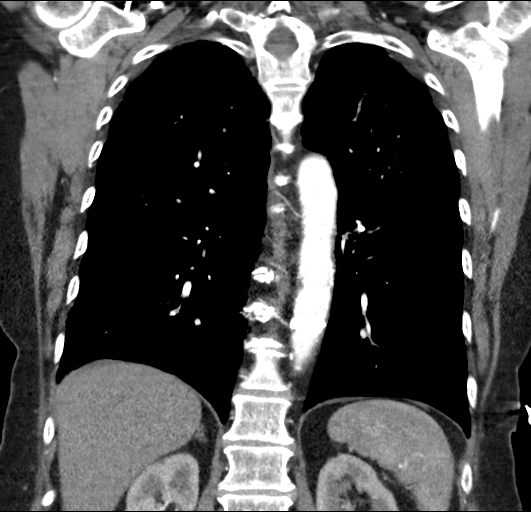

[19 of 46 positions shown; findings below may reference images not displayed]

FINDINGS: Cardiovascular: Pulmonary arterial opacification is adequate without
evidence of emboli. There is moderate thoracic aortic
atherosclerosis without aneurysm. Heart size is normal. No
pericardial effusion.

Mediastinum/Nodes: No enlarged axillary, mediastinal, or hilar lymph
nodes. Unchanged 9 mm right paratracheal lymph node.

Lungs/Pleura: No pleural effusion or pneumothorax. Emphysema,
bronchial wall thickening, and mild bronchiectasis are again noted.
There is new, relatively extensive ground-glass opacity in the upper
lobes, slightly greater on the left. There is some associated
interlobular septal thickening. There is mild atelectasis and
scarring in the lung bases. No mass.

Upper Abdomen: Unremarkable.

Musculoskeletal: Mild thoracic disc degeneration.

Review of the MIP images confirms the above findings.
IMPRESSION: 1. No evidence of pulmonary emboli.
2. New bilateral upper lobe ground-glass opacities, most likely
reflecting acute infection (including both bacterial and atypical
organisms) as these were not present on last month's CT.
3. Emphysema.
4.  Aortic Atherosclerosis ([id number]).

## 2018-01-21 ENCOUNTER — Ambulatory Visit
Admission: RE | Admit: 2018-01-21 | Discharge: 2018-01-21 | Disposition: A | Discharge status: Home / Self Care | Payer: PPO | Source: Ambulatory Visit | Attending: Family Medicine | Admitting: Family Medicine

## 2018-01-21 DIAGNOSIS — M85852 Other specified disorders of bone density and structure, left thigh: Secondary | ICD-10-CM | POA: Diagnosis not present

## 2018-01-21 DIAGNOSIS — Z780 Asymptomatic menopausal state: Secondary | ICD-10-CM | POA: Diagnosis not present

## 2018-01-21 DIAGNOSIS — E2839 Other primary ovarian failure: Secondary | ICD-10-CM

## 2018-01-24 DIAGNOSIS — L308 Other specified dermatitis: Secondary | ICD-10-CM | POA: Diagnosis not present

## 2018-02-14 DIAGNOSIS — L308 Other specified dermatitis: Secondary | ICD-10-CM | POA: Diagnosis not present

## 2018-02-20 ENCOUNTER — Other Ambulatory Visit: Payer: Self-pay | Admitting: Family Medicine

## 2018-02-20 NOTE — Telephone Encounter (Signed)
Given [place] STOP act laws, I decline to fill opioids from other providers.   I will send to my partner who is in town.

## 2018-02-20 NOTE — Telephone Encounter (Signed)
Name of Medication: Hydrocodone apap 5-325 Name of Pharmacy: [facility] or [facility] Date and Quantity: # 120 on 01/15/18 Last Office Visit and Type:11/06/17 annual01/03/20acute  Next Office Visit and Type:11/19/18 CPX  Last Controlled Substance Agreement Date:08/15/16  Last UDS:11/06/17  D r Damita Dunnings out of office.Please advise.

## 2018-02-20 NOTE — Telephone Encounter (Signed)
I thank all involved.   rx sent.  Needs f/u 3 month OV re: pain meds per routine.  [place] database reviewed, appropriate.  Thanks.

## 2018-02-21 NOTE — Telephone Encounter (Signed)
Appointment scheduled.

## 2018-02-26 ENCOUNTER — Ambulatory Visit (INDEPENDENT_AMBULATORY_CARE_PROVIDER_SITE_OTHER): Payer: PPO | Admitting: Family Medicine

## 2018-02-26 ENCOUNTER — Encounter: Payer: Self-pay | Admitting: Family Medicine

## 2018-02-26 DIAGNOSIS — E785 Hyperlipidemia, unspecified: Secondary | ICD-10-CM | POA: Diagnosis not present

## 2018-02-26 DIAGNOSIS — G8929 Other chronic pain: Secondary | ICD-10-CM | POA: Diagnosis not present

## 2018-02-26 DIAGNOSIS — R21 Rash and other nonspecific skin eruption: Secondary | ICD-10-CM | POA: Diagnosis not present

## 2018-02-26 DIAGNOSIS — J432 Centrilobular emphysema: Secondary | ICD-10-CM

## 2018-02-26 MED ORDER — HALOBETASOL PROPIONATE 0.05 % EX CREA: TOPICAL_CREAM | Freq: Two times a day (BID) | CUTANEOUS | Status: DC | PRN

## 2018-02-26 MED ORDER — MULTIVITAMIN ADULT PO TABS: 1.0000 | ORAL_TABLET | Freq: Every day | ORAL | Status: DC

## 2018-02-26 NOTE — Progress Notes (Signed)
Indication for chronic opioid: Chronic back pain, at baseline. Medication and dose: hydrocodone5/325 # pills per month: 120 Last UDS date: 11/06/17 Pain contract signed (Y/N): yes Date narcotic database last reviewed (include red flags):02/26/18  Pain inventory (1-10) Average pain:still activity dependent.   Pain now:"not bad now"with dose of med prior to OV.  Pain wasburning in the lower back and her legs. Pain is worse when due for med, early AM, if prolonged sitting. Relief from meds:yes, seen above.  Gabapentin is helping some. She clearly does better with concurrent gabapentin and hydrocodone.She does not do as well with pain control when she is taking the medication separately. This is unchanged. d/w pt.    In the last 24 hours, how much has pain interfered with the following (1-10 greatest interference)? General activity- "I couldn't get up without the medicine" sheaffirms again.gabapentin and hydrocodone help in combination Relationships with others- still able to function with pain med Enjoyment of life- is able to function with pain med What time of the day is the pain the worst- AM. Sleep is described by patient as"sometimes okay sometimes not" from pain and from longstanding insomnia. She has waking in the middle of the night, partly related to pain, partly from nocturia. she is putting up with that.  Mobility/function Assistance device:no How many minutes can you walk:"from here to the parking lot", has to sit while shopping some of the time.  This is at baseline.    Able to climb steps:yes, with effort. Driving:yes Disabled:no  Bowel or bladder symptoms:nocturia at baseline.   Mood:"It's good"This is her baseline.  Physicians involved in care:Dr. Elsworth Soho with pulmonary. Any changes since last visit? see below.    She had been skipping her crestor at night, d/w pt about restart.   We talked about strategy to keep  compliance up.   No recent SABA or anoro use.  She may not need anoro use at this point, given the above.    Halobetasol helped with skin rash.  She had seen derm in the meantime.    Meds, vitals, and allergies reviewed.   ROS: Per HPI unless specifically indicated in ROS section   GEN: nad, alert and oriented HEENT: mucous membranes moist NECK: supple w/o LA CV: rrr PULM: ctab, no inc wob ABD: soft, +bs EXT: no edema SKIN: no acute rash Lower back ttp in the midline.

## 2018-02-26 NOTE — Patient Instructions (Addendum)
If you need to use albuterol, then it makes sense to restart anoro also.   Schedule a 3 month pain medicine visit on the way out.  Take care.  Glad to see you.  Don't change your meds for now, other than starting a multivitamin.

## 2018-02-27 NOTE — Assessment & Plan Note (Signed)
No recent SABA or anoro use.  She may not need anoro use at this point, given the above.   Update me as needed.  She agrees.

## 2018-02-27 NOTE — Assessment & Plan Note (Signed)
Indication for chronic opioid: Chronic back pain, at baseline. Medication and dose: hydrocodone5/325 # pills per month: 120 Last UDS date: 11/06/17 Pain contract signed (Y/N): yes Date narcotic database last reviewed (include red flags):02/26/18  No change in meds.  Some relief with medication.  Risk of use is likely much lower than benefit at this point, so it makes sense to continue as is.  She agrees.  Recheck periodically.  Update me as needed.  Not sedated.

## 2018-02-27 NOTE — Assessment & Plan Note (Signed)
Improved with halobetasol.  Use as needed.  Update me as needed.  She agrees.

## 2018-02-27 NOTE — Assessment & Plan Note (Signed)
She had been skipping her crestor at night, d/w pt about restart.   We talked about strategy to keep compliance up.

## 2018-03-15 ENCOUNTER — Other Ambulatory Visit: Payer: Self-pay | Admitting: Family Medicine

## 2018-03-22 ENCOUNTER — Other Ambulatory Visit: Payer: Self-pay | Admitting: Family Medicine

## 2018-03-22 NOTE — Telephone Encounter (Signed)
Name of Medication: Hydrocodone Name of Pharmacy: [facility] or Written Date and Quantity:  120 tablet 0 02/20/2018  Last Office Visit and Type: 02/21/2018 Pain Mgmt. Next Office Visit and Type: 05/28/2018 Pain Mgmt Last Controlled Substance Agreement Date: 10/19/2014 Last UDS:11/06/2017

## 2018-03-24 NOTE — Telephone Encounter (Signed)
Sent. Thanks.   

## 2018-04-02 ENCOUNTER — Ambulatory Visit (INDEPENDENT_AMBULATORY_CARE_PROVIDER_SITE_OTHER): Payer: PPO | Admitting: Family Medicine

## 2018-04-02 ENCOUNTER — Other Ambulatory Visit: Payer: Self-pay

## 2018-04-02 ENCOUNTER — Encounter: Payer: Self-pay | Admitting: Family Medicine

## 2018-04-02 DIAGNOSIS — M542 Cervicalgia: Secondary | ICD-10-CM

## 2018-04-02 HISTORY — DX: Cervicalgia: M54.2

## 2018-04-02 MED ORDER — PREDNISONE 10 MG PO TABS: ORAL_TABLET | ORAL | 0 refills | Status: DC

## 2018-04-02 MED ORDER — TIZANIDINE HCL 4 MG PO TABS: 4.0000 mg | ORAL_TABLET | Freq: Four times a day (QID) | ORAL | 0 refills | Status: DC | PRN

## 2018-04-02 NOTE — Patient Instructions (Signed)
Tizanidine for muscle spasms.  Start prednisone with food.  Update me as needed.  Take care.  Glad to see you.

## 2018-04-02 NOTE — Assessment & Plan Note (Signed)
Likely diffuse spasm after sleeping in recliner.   No reason to suspect ominous dx.   D/w pt about options.  Tizanidine for muscle spasms.  Start prednisone with food.  Update me as needed.  Continue baseline pain meds.  She agrees.   Okay for outpatient f/u.

## 2018-04-02 NOTE — Progress Notes (Signed)
Neck pain.  Sx started yesterday AM.  "I thought I slept wrong." she had slept in a recliner recently.  No trauma, fall.  No rash.  No fevers.  No cough.  Some throat clearing at baseline.  Pain radiates to the B shoulders but not down the arms.  This is different than her typical pain.  Pain with neck ROM.  Not totally stiff but sore with ROM.  She tried head and ice w/o relief.  No arm or leg weakness.   Meds, vitals, and allergies reviewed.   ROS: Per HPI unless specifically indicated in ROS section   GEN: nad, alert and oriented HEENT: mucous membranes moist, OP wnl NECK: diffusely sore posteriorly, pain with ROM in all directions but not stiff and no LA CV: rrr.  PULM: ctab, no inc wob ABD: soft, +bs EXT: no edema SKIN: no acute rash S/S grossly wnl x4, DTRs wnl B Normal B shoulder ROM

## 2018-04-23 IMAGING — DX DG HIP (WITH OR WITHOUT PELVIS) 2-3V*R*
3 series · 3 of 3 positions shown · non-contrast
Comparison: None in PACs

CLINICAL DATA: Right hip pain began today without known injury.

EXAM:
DG HIP (WITH OR WITHOUT PELVIS) 2-3V RIGHT

[hip ap]
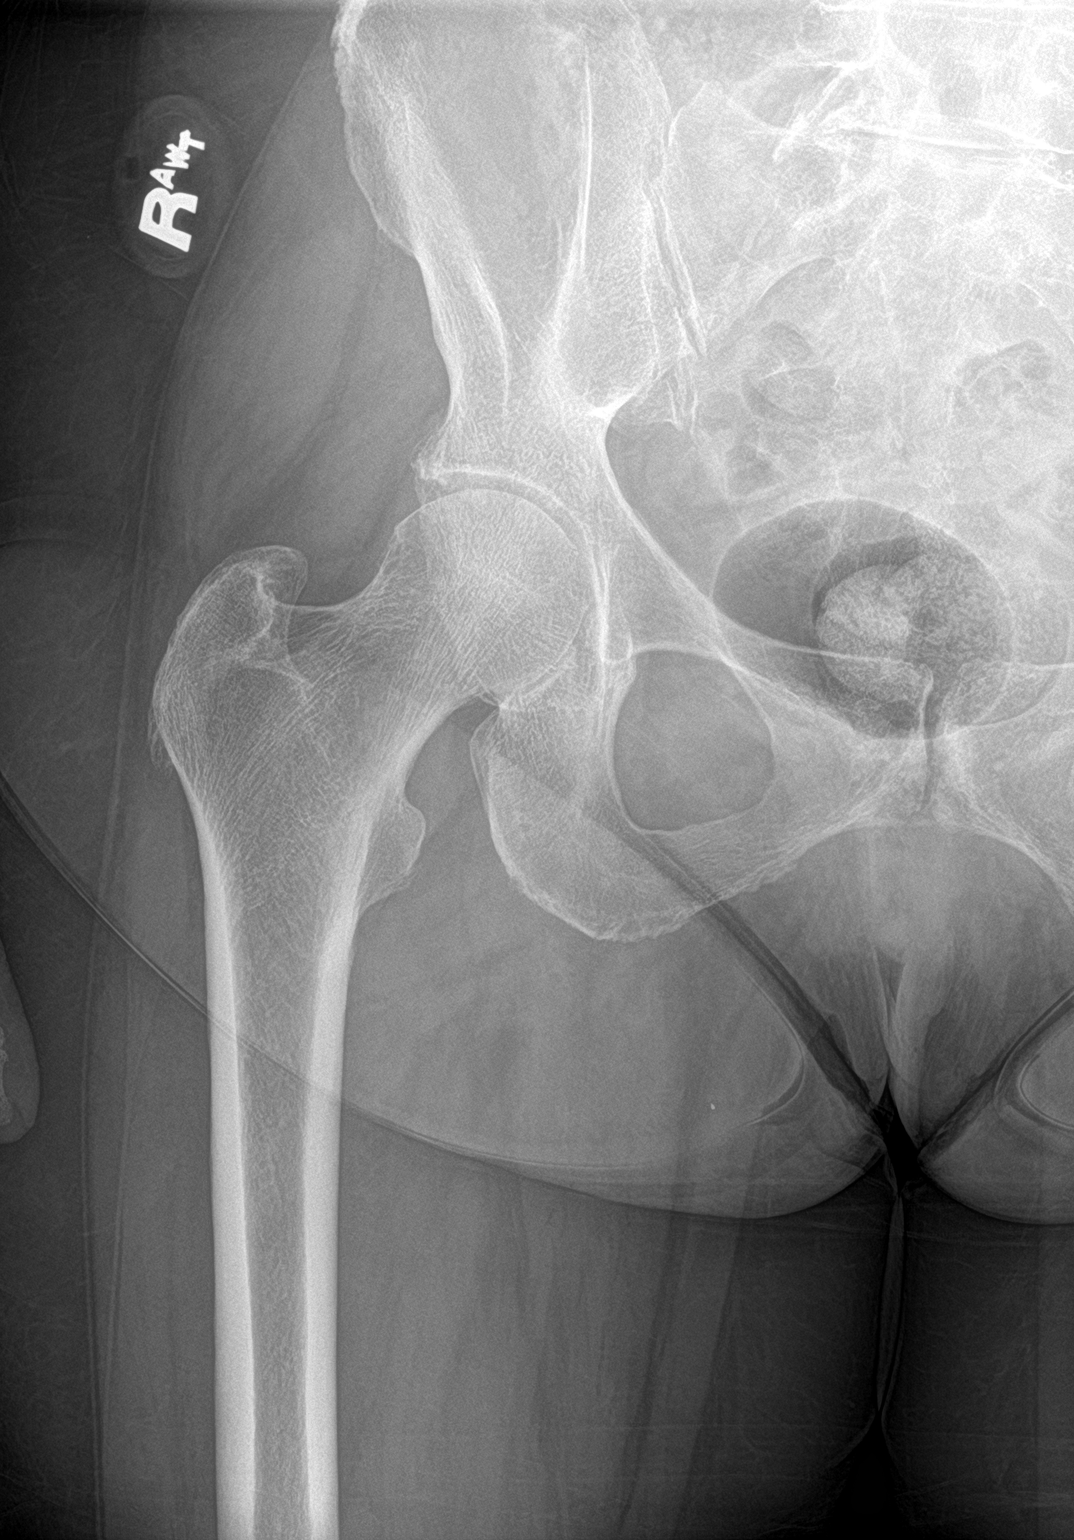

[pelvis ap]
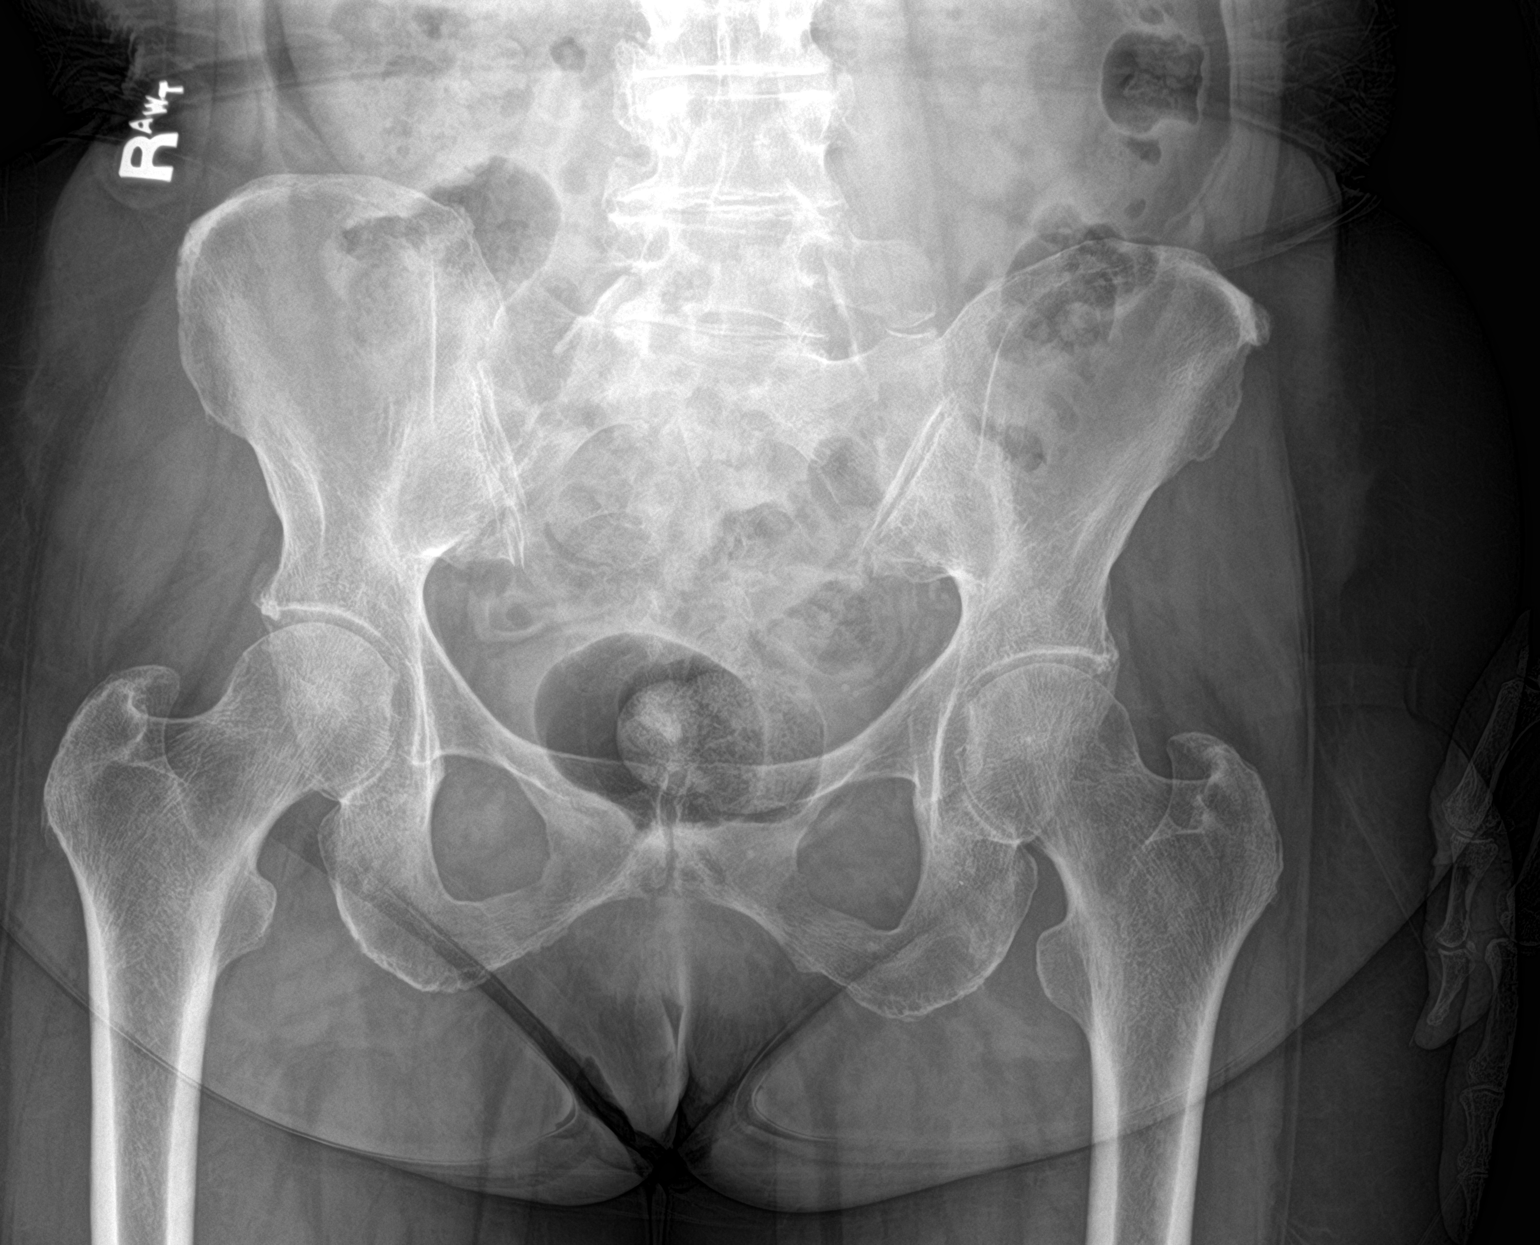

[hip lat]
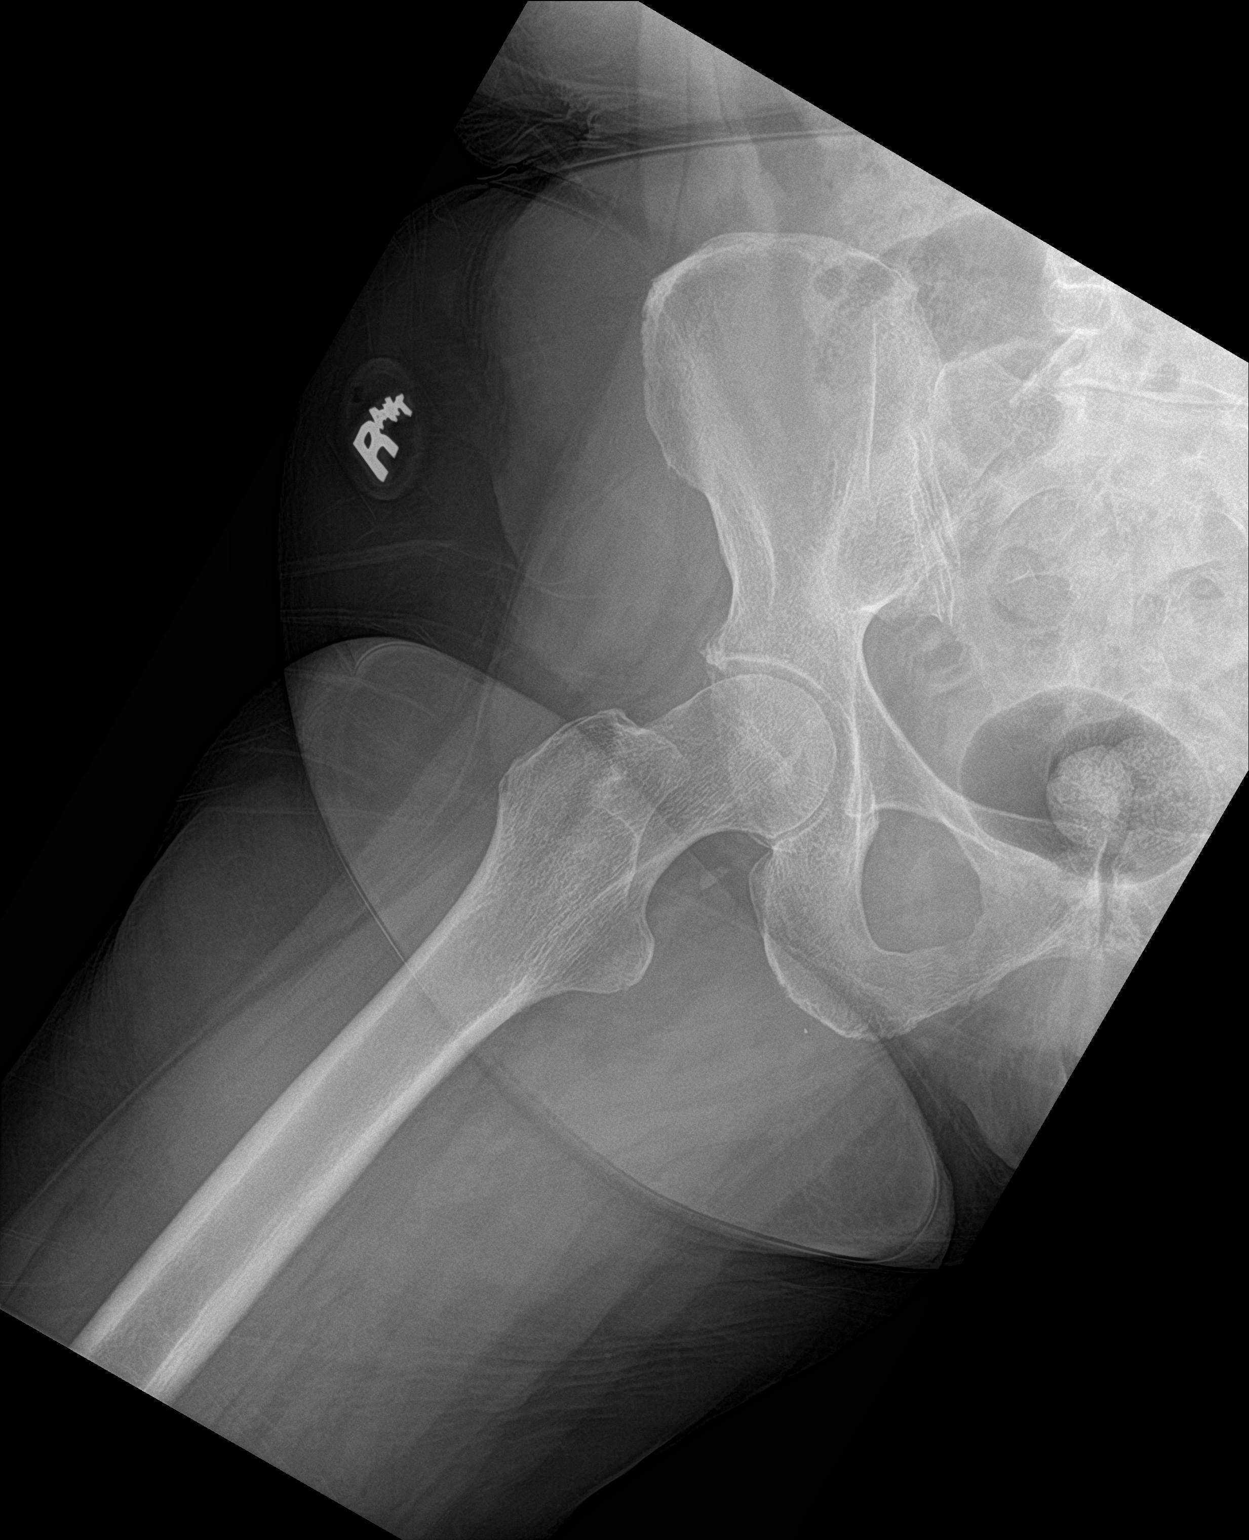

[3 of 3 positions shown; findings below may reference images not displayed]

FINDINGS: The bony pelvis is subjectively osteopenic. No lytic or blastic
lesion is observed. No acute pelvic fracture is demonstrated. AP and
lateral views of the right hip reveal moderate asymmetric joint
space loss. The articular surfaces of the femoral head and
acetabulum remains smoothly rounded. The femoral neck,
intertrochanteric, and subtrochanteric regions are normal.
IMPRESSION: There is moderate osteoarthritic change of the right hip. No acute
fracture or dislocation is observed.

## 2018-04-29 ENCOUNTER — Other Ambulatory Visit: Payer: Self-pay | Admitting: Family Medicine

## 2018-04-29 NOTE — Telephone Encounter (Signed)
Name of Medication: Hydrocodone Name of Pharmacy: [facility] Date and Quantity: 03-25-18 #120 Last Office Visit and Type: 02-26-18 Next Office Visit and Type: 05-28-18 Last Controlled Substance Agreement Date:  Last UDS: 11-06-17

## 2018-04-30 NOTE — Telephone Encounter (Signed)
Sent. Thanks.   

## 2018-05-14 ENCOUNTER — Telehealth: Payer: Self-pay | Admitting: *Deleted

## 2018-05-14 NOTE — Telephone Encounter (Signed)
Please try to get patient on the phone for formal triage and offer eval, either in person if needed or web based if possible.  Please give her routine ER cautions.  Thanks.

## 2018-05-14 NOTE — Telephone Encounter (Signed)
Patient notified as instructed by telephone and verbalized understanding. Patient stated that she has been having more SOB and using her inhaler more often than usual. Patient negative for Covid screening with the exception of SOB which has been going on for about 2 weeks. Patient stated that she does not have a cell phone to do a Web visit, but can do a phone visit. Patient stated that she prefers coming in to see Dr. Damita Dunnings. Patient stated that she has an upcoming appointment scheduled with Dr. Damita Dunnings. Advised patient of routine ER cautions.

## 2018-05-14 NOTE — Telephone Encounter (Signed)
Patient's daughter left a voicemail stating that she does not think that her mom has called about some problems that she is having. [name] (on [place]) stated that her mom told her that she is having to use her inhaler more often because she is having more difficulty breathing. [name] stated that she thinks that her mom may need to come in for a checkup.

## 2018-05-15 NOTE — Telephone Encounter (Signed)
Please call patient and schedule appointment as instructed. 

## 2018-05-15 NOTE — Telephone Encounter (Signed)
Pt would prefer to come into office to see Dr. Damita Dunnings. I scheduled her for Friday 05/17/18. I tried to offer virtual appt. But she doesn't have access to do this type of appt. I told her that we could do a phone visit but she said she is fine and wants to come in.

## 2018-05-15 NOTE — Telephone Encounter (Signed)
Patient called back asking about rescheduling her 05/28/18 appointment sooner.  I notified her that a message had been sent to [facility] about an appointment and someone would call her back about the appointment.  Patient said she wants the call to come to her and not her daughter.

## 2018-05-15 NOTE — Telephone Encounter (Signed)
Noted. Thanks.

## 2018-05-15 NOTE — Telephone Encounter (Signed)
Please call her and schedule when possible for patient.  Thanks.

## 2018-05-17 ENCOUNTER — Other Ambulatory Visit: Payer: Self-pay

## 2018-05-17 ENCOUNTER — Encounter: Payer: Self-pay | Admitting: Family Medicine

## 2018-05-17 ENCOUNTER — Ambulatory Visit (INDEPENDENT_AMBULATORY_CARE_PROVIDER_SITE_OTHER): Payer: PPO | Admitting: Family Medicine

## 2018-05-17 DIAGNOSIS — G8929 Other chronic pain: Secondary | ICD-10-CM | POA: Diagnosis not present

## 2018-05-17 DIAGNOSIS — J432 Centrilobular emphysema: Secondary | ICD-10-CM

## 2018-05-17 DIAGNOSIS — M545 Low back pain, unspecified: Secondary | ICD-10-CM

## 2018-05-17 MED ORDER — UMECLIDINIUM-VILANTEROL 62.5-25 MCG/INH IN AEPB: 1.0000 | INHALATION_SPRAY | Freq: Every day | RESPIRATORY_TRACT | 3 refills | Status: DC

## 2018-05-17 MED ORDER — AMOXICILLIN-POT CLAVULANATE 875-125 MG PO TABS: 1.0000 | ORAL_TABLET | Freq: Two times a day (BID) | ORAL | 0 refills | Status: DC

## 2018-05-17 MED ORDER — PREDNISONE 10 MG PO TABS: ORAL_TABLET | ORAL | 0 refills | Status: DC

## 2018-05-17 MED ORDER — TIZANIDINE HCL 4 MG PO TABS: 4.0000 mg | ORAL_TABLET | Freq: Four times a day (QID) | ORAL | 3 refills | Status: DC | PRN

## 2018-05-17 MED ORDER — ALBUTEROL SULFATE HFA 108 (90 BASE) MCG/ACT IN AERS: 1.0000 | INHALATION_SPRAY | Freq: Four times a day (QID) | RESPIRATORY_TRACT | 3 refills | Status: DC | PRN

## 2018-05-17 NOTE — Assessment & Plan Note (Signed)
Indication for chronic opioid: Chronic back pain, at baseline. Medication and dose: hydrocodone5/325 # pills per month: 120 Last UDS date: 11/06/17 Pain contract signed (Y/N): yes Date narcotic database last reviewed (include red flags): 05/17/18  I'll send refill for pain meds later this month.  Use the tizanidine if needed.  No change in meds o/w at this point.  She has some relief from combination of hydrocodone and gabapentin and it makes sense to continue as is.  She agrees.  No ADE on meds.

## 2018-05-17 NOTE — Progress Notes (Signed)
Indication for chronic opioid: Chronic back pain, at baseline. Medication and dose: hydrocodone5/325 # pills per month: 120 Last UDS date: 11/06/17 Pain contract signed (Y/N): yes Date narcotic database last reviewed (include red flags): 05/17/18  Pain inventory (1-10) Average pain:still activity dependent.   Pain VCB:SWHQ and lower back pain "not where I can't stand it but worse off the tizanidine."  Pain wasburning in the lower back (and intermittently in her legs). Pain is worse when due for med, early AM Relief from meds:yes, seen above.  Gabapentin is helping some. She clearly does better with concurrent gabapentin and hydrocodone.She does not do as well with pain control when she is taking the medication separately. This is unchanged.d/w pt.  Tizanidine clearly helped with neck and back pain but she ran out of medicine.   In the last 24 hours, how much has pain interfered with the following (1-10 greatest interference)? General activity- "I couldn't get up and do anything without the medicine"  Gabapentin and hydrocodone help in combination Relationships with others- still able to function with pain med Enjoyment of life- is able to function with pain med What time of the day is the pain the worst- AM. Sleep is described by patient as"sometimes okay sometimes not" from pain and from longstanding insomnia. She has waking in the middle of the night, partly related to pain, partly from nocturia. she is putting up with that.  Mobility/function Assistance device:no How many minutes can you walk:reaffirms "from here to the parking lot", has to sit while shopping some of the time.  This is at baseline.   Able to climb steps:yes, with effort. Driving:yes Disabled:no  Bowel or bladder symptoms:nocturia at baseline.   Mood:"It's good"This is her baseline.  Physicians involved in care:Dr. Elsworth Soho with pulmonary. Any changes since last visit?  see below.    Pandemic considerations d/w pt.  He is making, etc.    COPD/breathing d/w pt.  Her sx were worse this week.  She noted more SOB.  Not wheezing more.  Can still get a deep breath.  No FCNAVD.  Some occ sputum, not more than normal.  Sputum has been greenish vs clear and that is typical for patient.    Prev had period of time w/o need for anoro or SABA.  Still off anoro.  Some temporary relief with SABA recently, restarted in the last week.  anoro helped prev when used. No CP.    PMH SH reviewed.   Meds, vitals, and allergies reviewed.   ROS: Per HPI unless specifically indicated in ROS section   GEN: nad, alert and oriented HEENT: mucous membranes moist, OP wnl NECK: supple w/o LA CV: rrr PULM: ctab, no inc wob, no wheeze, no focal dec in BS ABD: soft, +bs EXT: no edema SKIN: no acute rash

## 2018-05-17 NOTE — Patient Instructions (Addendum)
I'll send refill for pain meds later this month.  Use the tizanidine if needed.   Hold augmentin and prednisone for now.    Restart anoro and albuterol.  Use anoro daily and albuterol if needed.   If worse in the meantime, then start the prednisone and augmentin.   Update me as needed.  Take care.  Glad to see you.

## 2018-05-17 NOTE — Assessment & Plan Note (Signed)
No known covid exposure.  Routine cautions d/w pt . Hold augmentin and prednisone for now.   Restart anoro and albuterol.  Use anoro daily and albuterol if needed.   If worse in the meantime, then start the prednisone and augmentin.  She agrees. She may only need anoro/SABA combination for part of the year, d/w pt.  She'll update me as needed.  No focal dec in BS and speaking in complete sentences. Okay for outpatient f/u.

## 2018-05-17 NOTE — Assessment & Plan Note (Addendum)
Indication for chronic opioid: Chronic back pain, at baseline. Medication and dose: hydrocodone5/325 # pills per month: 120 Last UDS date: 11/06/17 Pain contract signed (Y/N): yes Date narcotic database last reviewed (include red flags): 05/17/18  I'll send refill for pain meds later this month.  Use the tizanidine if needed.  No change in meds o/w at this point.  She has some relief from combination of hydrocodone and gabapentin and it makes sense to continue as is.  She agrees.  No ADE on meds.   >25 minutes spent in face to face time with patient, >50% spent in counselling or coordination of care

## 2018-05-27 ENCOUNTER — Other Ambulatory Visit: Payer: Self-pay | Admitting: Family Medicine

## 2018-05-27 MED ORDER — HYDROCODONE-ACETAMINOPHEN 5-325 MG PO TABS: 1.0000 | ORAL_TABLET | Freq: Four times a day (QID) | ORAL | 0 refills | Status: DC | PRN

## 2018-05-27 NOTE — Progress Notes (Signed)
3 month prescriptions for hydrocodone sent to pharmacy.  First prescription is to be filled on/after 05/29/2018.  Notify patient.  Thanks.  Patient advised.   Mike Craze, CMA  05/28/2018

## 2018-05-28 ENCOUNTER — Ambulatory Visit: Payer: PPO | Admitting: Family Medicine

## 2018-07-08 ENCOUNTER — Other Ambulatory Visit: Payer: Self-pay | Admitting: Family Medicine

## 2018-07-31 ENCOUNTER — Ambulatory Visit (INDEPENDENT_AMBULATORY_CARE_PROVIDER_SITE_OTHER): Payer: PPO | Admitting: Internal Medicine

## 2018-07-31 ENCOUNTER — Ambulatory Visit: Payer: PPO | Admitting: Internal Medicine

## 2018-07-31 ENCOUNTER — Encounter: Payer: Self-pay | Admitting: Internal Medicine

## 2018-07-31 ENCOUNTER — Telehealth: Payer: Self-pay

## 2018-07-31 DIAGNOSIS — M542 Cervicalgia: Secondary | ICD-10-CM | POA: Diagnosis not present

## 2018-07-31 DIAGNOSIS — R221 Localized swelling, mass and lump, neck: Secondary | ICD-10-CM | POA: Diagnosis not present

## 2018-07-31 DIAGNOSIS — G44201 Tension-type headache, unspecified, intractable: Secondary | ICD-10-CM

## 2018-07-31 DIAGNOSIS — Z20822 Contact with and (suspected) exposure to covid-19: Secondary | ICD-10-CM

## 2018-07-31 DIAGNOSIS — Z20828 Contact with and (suspected) exposure to other viral communicable diseases: Secondary | ICD-10-CM

## 2018-07-31 MED ORDER — PREDNISONE 10 MG PO TABS: ORAL_TABLET | ORAL | 0 refills | Status: DC

## 2018-07-31 MED ORDER — CYCLOBENZAPRINE HCL 5 MG PO TABS: 5.0000 mg | ORAL_TABLET | Freq: Every day | ORAL | 0 refills | Status: DC

## 2018-07-31 NOTE — Patient Instructions (Signed)

## 2018-07-31 NOTE — Telephone Encounter (Signed)
Pt notified as instructed and pt voiced understanding. Pt will go to [facility] for covid testing on 08/01/18 between 8 AM and 3:30 PM.pt said her neck is hurting worse and would like some medicine today. Pt scheduled virtual visit today at 4:15 with  Avie Echevaria NP. Pt has a flip phone so will be phone visit. FYI to Avie Echevaria NP.
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
## 2018-07-31 NOTE — Telephone Encounter (Signed)
Pt said that she is taking tizanidine 4mg  q6h for neck pain and the tizanidine is no longer helping the neck pain. Pt gets a H/A after gets the neck pain. Pt said a knot size of a dime came up on back of head 2 months ago; pt has had no injury. Neck hurts worse when turns her neck. Pain level 5. No covid symptoms except H/A and SOB (pt said has COPD). No travel but pt was exposed by + covid when pt saw her sister on 07/25/18 and not wearing a mask and pt sister was positive covid on 07/26/18. Pt said she is not under quarantine due to pt does not think she has covid symptoms. Please advise. [place].

## 2018-07-31 NOTE — Telephone Encounter (Signed)
She should quarantine in the meantime until she has a test resulted.  Please get her tested in the meantime.  I put in the order. Please offer virtual visit regarding neck pain.  Thanks.

## 2018-08-01 ENCOUNTER — Other Ambulatory Visit: Payer: Self-pay

## 2018-08-01 DIAGNOSIS — Z20822 Contact with and (suspected) exposure to covid-19: Secondary | ICD-10-CM

## 2018-08-01 DIAGNOSIS — R6889 Other general symptoms and signs: Secondary | ICD-10-CM | POA: Diagnosis not present

## 2018-08-03 LAB — NOVEL CORONAVIRUS, NAA: SARS-CoV-2, NAA: NOT DETECTED

## 2018-08-27 ENCOUNTER — Other Ambulatory Visit: Payer: Self-pay | Admitting: Internal Medicine

## 2018-08-30 ENCOUNTER — Other Ambulatory Visit: Payer: Self-pay | Admitting: Family Medicine

## 2018-08-30 NOTE — Telephone Encounter (Signed)
Name of Medication: Hydrocodone-acetaminophen Name of Pharmacy: [facility] or Written Date and Quantity:  120 tablet 0 05/27/2018    Sig - Route: Take 1 tablet by mouth every 6 (six) hours as needed. for pain - Oral  Last Office Visit and Type: 07/31/2018 [facility] [place] Next Office Visit and Type: 11/19/2018 CPE Last Controlled Substance Agreement Date: 10/19/2014 Last UDS:11/06/2017

## 2018-09-01 MED ORDER — HYDROCODONE-ACETAMINOPHEN 5-325 MG PO TABS: 1.0000 | ORAL_TABLET | Freq: Four times a day (QID) | ORAL | 0 refills | Status: DC | PRN

## 2018-09-01 NOTE — Telephone Encounter (Signed)
Sent. Thanks.   

## 2018-09-19 ENCOUNTER — Other Ambulatory Visit: Payer: Self-pay | Admitting: Family Medicine

## 2018-10-03 ENCOUNTER — Other Ambulatory Visit: Payer: Self-pay

## 2018-10-03 ENCOUNTER — Encounter: Payer: Self-pay | Admitting: Family Medicine

## 2018-10-03 ENCOUNTER — Ambulatory Visit (INDEPENDENT_AMBULATORY_CARE_PROVIDER_SITE_OTHER): Payer: PPO | Admitting: Family Medicine

## 2018-10-03 VITALS — BP 132/74 | HR 71 | Temp 97.2°F | Ht <= 58 in | Wt 124.5 lb

## 2018-10-03 DIAGNOSIS — R413 Other amnesia: Secondary | ICD-10-CM | POA: Diagnosis not present

## 2018-10-03 DIAGNOSIS — G8929 Other chronic pain: Secondary | ICD-10-CM

## 2018-10-03 LAB — CBC WITH DIFFERENTIAL/PLATELET
Basophils Absolute: 0.1 10*3/uL (ref 0.0–0.1)
Basophils Relative: 1.1 % (ref 0.0–3.0)
Eosinophils Absolute: 0.2 10*3/uL (ref 0.0–0.7)
Eosinophils Relative: 2.6 % (ref 0.0–5.0)
HCT: 41.5 % (ref 36.0–46.0)
Hemoglobin: 13.4 g/dL (ref 12.0–15.0)
Lymphocytes Relative: 34.2 % (ref 12.0–46.0)
Lymphs Abs: 2.8 10*3/uL (ref 0.7–4.0)
MCHC: 32.2 g/dL (ref 30.0–36.0)
MCV: 93.6 fl (ref 78.0–100.0)
Monocytes Absolute: 0.8 10*3/uL (ref 0.1–1.0)
Monocytes Relative: 9.5 % (ref 3.0–12.0)
Neutro Abs: 4.3 10*3/uL (ref 1.4–7.7)
Neutrophils Relative %: 52.6 % (ref 43.0–77.0)
Platelets: 296 10*3/uL (ref 150.0–400.0)
RBC: 4.44 Mil/uL (ref 3.87–5.11)
RDW: 12.7 % (ref 11.5–15.5)
WBC: 8.1 10*3/uL (ref 4.0–10.5)

## 2018-10-03 LAB — COMPREHENSIVE METABOLIC PANEL
ALT: 10 U/L (ref 0–35)
AST: 18 U/L (ref 0–37)
Albumin: 4.2 g/dL (ref 3.5–5.2)
Alkaline Phosphatase: 83 U/L (ref 39–117)
BUN: 15 mg/dL (ref 6–23)
CO2: 26 mEq/L (ref 19–32)
Calcium: 9.3 mg/dL (ref 8.4–10.5)
Chloride: 103 mEq/L (ref 96–112)
Creatinine, Ser: 0.89 mg/dL (ref 0.40–1.20)
GFR: 61.45 mL/min (ref >60.00)
Glucose, Bld: 85 mg/dL (ref 70–99)
Potassium: 4.7 mEq/L (ref 3.5–5.1)
Sodium: 139 mEq/L (ref 135–145)
Total Bilirubin: 0.5 mg/dL (ref 0.2–1.2)
Total Protein: 6.7 g/dL (ref 6.0–8.3)

## 2018-10-03 LAB — VITAMIN B12: Vitamin B-12: 222 pg/mL (ref 211–911)

## 2018-10-03 LAB — TSH: TSH: 1.9 u[IU]/mL (ref 0.35–4.50)

## 2018-10-03 NOTE — Patient Instructions (Addendum)
Stop zanaflex and go to the lab on the way out.  You have a normal neurologic exam today.  If you labs are normal then we may need to get a CT done.  Update me as needed.  Take care.  Glad to see you.

## 2018-10-03 NOTE — Progress Notes (Signed)
Indication for chronic opioid: Chronic back pain, at baseline. Medication and dose: hydrocodone5/325 # pills per month: 120 Last UDS date:11/06/17 Pain contract signed (Y/N): yes Date narcotic database last reviewed (include red flags): 10/03/18   Pain inventory (1-10) Average pain:still activity dependent. Pain now:not much at time of exam.   Painwasburning in the lower back (and intermittently in her legs). Pain is worse when due for med, early AM Relief from meds:yes, see above.  Gabapentin is helping some. She clearly does better with concurrent gabapentin and hydrocodone.She does not do as well with pain control when she is taking the medication separately. This is unchanged.d/w pt.  In the last 24 hours, how much has pain interfered with the following (1-10 greatest interference)? General activity-  Gabapentin and hydrocodone help in combination Relationships with others- still able to function with pain med Enjoyment of life- is able to function with pain med What time of the day is the pain the worst-  At night.   Sleep is described by patient as"sometimes okay sometimes not" from pain and from longstanding insomnia. She has waking in the middle of the night, partly related to pain, partly from nocturia. she is putting up with that.  Mobility/function Assistance device:no How many minutes can you walk:reaffirms "from here to the parking lot.  This is at baseline.  Able to climb steps:yes, with effort. Driving:yes Disabled:no  Bowel or bladder symptoms:nocturia at baseline. Mood:"It's good" per patient report, with occ periods of irritability.This is her baseline.  Physicians involved in care:Dr. Elsworth Soho with pulmonary. Any changes since last visit? see below.    Pandemic considerations d/w pt.  He is making, etc.    COPD/breathing d/w pt.  We talked about inhaler compliance.  Some occ sputum.  No fevers.  Relief of  pulmonary sx with inhaler use.   She has noted more changes in memory in the last few months.  She is A&Ox3.  3/3 attention.  Can read a watch.  Can do math.  3/3 recall.  She is forgetting road names and recipes.  Unclear if this is related to zanaflex use.     PMH SH reviewed.   Meds, vitals, and allergies reviewed.   ROS: Per HPI unless specifically indicated in ROS section  GEN: nad, alert and oriented HEENT: ncat NECK: supple w/o LA CV: rrr PULM: ctab, no inc wob, no wheeze, no focal dec in BS ABD: soft, +bs EXT: no edema SKIN: no acute rash

## 2018-10-06 ENCOUNTER — Other Ambulatory Visit: Payer: Self-pay | Admitting: Family Medicine

## 2018-10-06 DIAGNOSIS — R413 Other amnesia: Secondary | ICD-10-CM | POA: Insufficient documentation

## 2018-10-06 HISTORY — DX: Other amnesia: R41.3

## 2018-10-06 MED ORDER — VITAMIN B-12 1000 MCG PO TABS: 1000.0000 ug | ORAL_TABLET | Freq: Every day | ORAL | Status: DC

## 2018-10-06 NOTE — Assessment & Plan Note (Signed)
Unclear source.  She is A&Ox3.  3/3 attention.  Can read a watch.  Can do math.  3/3 recall.  S has not gotten lost.  She does have some trouble remembering names and recipes.  Unclear if this was related to Zanaflex use.  In the meantime stop Zanaflex.  Check routine labs today.  See notes on labs. >25 minutes spent in face to face time with patient, >50% spent in counselling or coordination of care.

## 2018-10-06 NOTE — Assessment & Plan Note (Addendum)
Indication for chronic opioid: Chronic back pain, at baseline. Medication and dose: hydrocodone5/325 # pills per month: 120 Last UDS date:11/06/17 Pain contract signed (Y/N): yes Date narcotic database last reviewed (include red flags): 10/03/18   No change in hydrocodone prescription at this point.  Routine cautions given to patient.  She is more functional with combination of gabapentin and hydrocodone.

## 2018-10-24 ENCOUNTER — Ambulatory Visit: Payer: PPO

## 2018-10-28 ENCOUNTER — Encounter: Payer: PPO | Admitting: Family Medicine

## 2018-11-10 ENCOUNTER — Other Ambulatory Visit: Payer: Self-pay | Admitting: Family Medicine

## 2018-11-10 DIAGNOSIS — M545 Low back pain, unspecified: Secondary | ICD-10-CM

## 2018-11-10 DIAGNOSIS — E785 Hyperlipidemia, unspecified: Secondary | ICD-10-CM

## 2018-11-10 DIAGNOSIS — E559 Vitamin D deficiency, unspecified: Secondary | ICD-10-CM

## 2018-11-10 DIAGNOSIS — G8929 Other chronic pain: Secondary | ICD-10-CM

## 2018-11-12 ENCOUNTER — Ambulatory Visit: Payer: PPO

## 2018-11-12 ENCOUNTER — Other Ambulatory Visit (INDEPENDENT_AMBULATORY_CARE_PROVIDER_SITE_OTHER): Payer: PPO

## 2018-11-12 ENCOUNTER — Other Ambulatory Visit: Payer: Self-pay

## 2018-11-12 DIAGNOSIS — M545 Low back pain, unspecified: Secondary | ICD-10-CM

## 2018-11-12 DIAGNOSIS — E559 Vitamin D deficiency, unspecified: Secondary | ICD-10-CM

## 2018-11-12 DIAGNOSIS — G8929 Other chronic pain: Secondary | ICD-10-CM | POA: Diagnosis not present

## 2018-11-12 DIAGNOSIS — E785 Hyperlipidemia, unspecified: Secondary | ICD-10-CM | POA: Diagnosis not present

## 2018-11-12 LAB — LIPID PANEL
Cholesterol: 256 mg/dL — ABNORMAL HIGH (ref 0–200)
HDL: 55.3 mg/dL (ref >39.00)
LDL Cholesterol: 176 mg/dL — ABNORMAL HIGH (ref 0–99)
NonHDL: 201.19
Total CHOL/HDL Ratio: 5
Triglycerides: 125 mg/dL (ref 0.0–149.0)
VLDL: 25 mg/dL (ref 0.0–40.0)

## 2018-11-12 LAB — VITAMIN D 25 HYDROXY (VIT D DEFICIENCY, FRACTURES): VITD: 34.83 ng/mL (ref 30.00–100.00)

## 2018-11-14 LAB — PAIN MGMT, PROFILE 8 W/CONF, U
6 Acetylmorphine: NEGATIVE ng/mL
Alcohol Metabolites: NEGATIVE ng/mL (ref <500)
Amphetamines: NEGATIVE ng/mL
Benzodiazepines: NEGATIVE ng/mL
Buprenorphine, Urine: NEGATIVE ng/mL
Cocaine Metabolite: NEGATIVE ng/mL
Codeine: NEGATIVE ng/mL
Creatinine: 45.3 mg/dL
Hydrocodone: 648 ng/mL
Hydromorphone: 164 ng/mL
MDMA: NEGATIVE ng/mL
Marijuana Metabolite: NEGATIVE ng/mL
Morphine: NEGATIVE ng/mL
Norhydrocodone: 1276 ng/mL
Opiates: POSITIVE ng/mL
Oxidant: NEGATIVE ug/mL
Oxycodone: NEGATIVE ng/mL
pH: 5 (ref 4.5–9.0)

## 2018-11-18 ENCOUNTER — Ambulatory Visit (INDEPENDENT_AMBULATORY_CARE_PROVIDER_SITE_OTHER): Payer: PPO

## 2018-11-18 DIAGNOSIS — Z0000 Encounter for general adult medical examination without abnormal findings: Secondary | ICD-10-CM

## 2018-11-18 NOTE — Progress Notes (Signed)
PCP notes:  Health Maintenance: Needs flu vaccine Will check with insurance about shingrix   Abnormal Screenings: none   Patient concerns: none   Nurse concerns: none   Next PCP appt.: 11/19/2018 @ 9:45 am
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
## 2018-11-18 NOTE — Patient Instructions (Signed)
Megan Peterson , Thank you for taking time to come for your Medicare Wellness Visit. I appreciate your ongoing commitment to your health goals. Please review the following plan we discussed and let me know if I can assist you in the future.   Screening recommendations/referrals: Colonoscopy: no longer required Mammogram: no longer required Bone Density: Up to date, completed 01/21/2018 Recommended yearly ophthalmology/optometry visit for glaucoma screening and checkup Recommended yearly dental visit for hygiene and checkup  Vaccinations: Influenza vaccine: will get at physical tomorrow Pneumococcal vaccine: Completed series Tdap vaccine: Up to date, completed 06/07/2011 Shingles vaccine: will check with insurance    Advanced directives: Advance directive discussed with you today. Even though you declined this today please call our office should you change your mind and we can give you the proper paperwork for you to fill out.  Conditions/risks identified: hypertension, hyperlipidemia  Next appointment: 11/19/2018 @ 9:45 Peterson    Preventive Care 65 Years and Older, Female Preventive care refers to lifestyle choices and visits with your health care provider that can promote health and wellness. What does preventive care include?  A yearly physical exam. This is also called an annual well check.  Dental exams once or twice a year.  Routine eye exams. Ask your health care provider how often you should have your eyes checked.  Personal lifestyle choices, including:  Daily care of your teeth and gums.  Regular physical activity.  Eating a healthy diet.  Avoiding tobacco and drug use.  Limiting alcohol use.  Practicing safe sex.  Taking low-dose aspirin every day.  Taking vitamin and mineral supplements as recommended by your health care provider. What happens during an annual well check? The services and screenings done by your health care provider during your annual well check will  depend on your age, overall health, lifestyle risk factors, and family history of disease. Counseling  Your health care provider may ask you questions about your:  Alcohol use.  Tobacco use.  Drug use.  Emotional well-being.  Home and relationship well-being.  Sexual activity.  Eating habits.  History of falls.  Memory and ability to understand (cognition).  Work and work Statistician.  Reproductive health. Screening  You may have the following tests or measurements:  Height, weight, and BMI.  Blood pressure.  Lipid and cholesterol levels. These may be checked every 5 years, or more frequently if you are over 22 years old.  Skin check.  Lung cancer screening. You may have this screening every year starting at age 51 if you have a 30-pack-year history of smoking and currently smoke or have quit within the past 15 years.  Fecal occult blood test (FOBT) of the stool. You may have this test every year starting at age 7.  Flexible sigmoidoscopy or colonoscopy. You may have a sigmoidoscopy every 5 years or a colonoscopy every 10 years starting at age 61.  Hepatitis C blood test.  Hepatitis B blood test.  Sexually transmitted disease (STD) testing.  Diabetes screening. This is done by checking your blood sugar (glucose) after you have not eaten for a while (fasting). You may have this done every 1-3 years.  Bone density scan. This is done to screen for osteoporosis. You may have this done starting at age 41.  Mammogram. This may be done every 1-2 years. Talk to your health care provider about how often you should have regular mammograms. Talk with your health care provider about your test results, treatment options, and if necessary, the need  for more tests. Vaccines  Your health care provider may recommend certain vaccines, such as:  Influenza vaccine. This is recommended every year.  Tetanus, diphtheria, and acellular pertussis (Tdap, Td) vaccine. You may need a  Td booster every 10 years.  Zoster vaccine. You may need this after age 42.  Pneumococcal 13-valent conjugate (PCV13) vaccine. One dose is recommended after age 40.  Pneumococcal polysaccharide (PPSV23) vaccine. One dose is recommended after age 22. Talk to your health care provider about which screenings and vaccines you need and how often you need them. This information is not intended to replace advice given to you by your health care provider. Make sure you discuss any questions you have with your health care provider. Document Released: 01/15/2015 Document Revised: 09/08/2015 Document Reviewed: 10/20/2014 Elsevier Interactive Patient Education  2017 [place] Prevention in the Home Falls can cause injuries. They can happen to people of all ages. There are many things you can do to make your home safe and to help prevent falls. What can I do on the outside of my home?  Regularly fix the edges of walkways and driveways and fix any cracks.  Remove anything that might make you trip as you walk through a door, such as a raised step or threshold.  Trim any bushes or trees on the path to your home.  Use bright outdoor lighting.  Clear any walking paths of anything that might make someone trip, such as rocks or tools.  Regularly check to see if handrails are loose or broken. Make sure that both sides of any steps have handrails.  Any raised decks and porches should have guardrails on the edges.  Have any leaves, snow, or ice cleared regularly.  Use sand or salt on walking paths during winter.  Clean up any spills in your garage right away. This includes oil or grease spills. What can I do in the bathroom?  Use night lights.  Install grab bars by the toilet and in the tub and shower. Do not use towel bars as grab bars.  Use non-skid mats or decals in the tub or shower.  If you need to sit down in the shower, use a plastic, non-slip stool.  Keep the floor dry. Clean  up any water that spills on the floor as soon as it happens.  Remove soap buildup in the tub or shower regularly.  Attach bath mats securely with double-sided non-slip rug tape.  Do not have throw rugs and other things on the floor that can make you trip. What can I do in the bedroom?  Use night lights.  Make sure that you have a light by your bed that is easy to reach.  Do not use any sheets or blankets that are too big for your bed. They should not hang down onto the floor.  Have a firm chair that has side arms. You can use this for support while you get dressed.  Do not have throw rugs and other things on the floor that can make you trip. What can I do in the kitchen?  Clean up any spills right away.  Avoid walking on wet floors.  Keep items that you use a lot in easy-to-reach places.  If you need to reach something above you, use a strong step stool that has a grab bar.  Keep electrical cords out of the way.  Do not use floor polish or wax that makes floors slippery. If you must use wax, use  non-skid floor wax.  Do not have throw rugs and other things on the floor that can make you trip. What can I do with my stairs?  Do not leave any items on the stairs.  Make sure that there are handrails on both sides of the stairs and use them. Fix handrails that are broken or loose. Make sure that handrails are as long as the stairways.  Check any carpeting to make sure that it is firmly attached to the stairs. Fix any carpet that is loose or worn.  Avoid having throw rugs at the top or bottom of the stairs. If you do have throw rugs, attach them to the floor with carpet tape.  Make sure that you have a light switch at the top of the stairs and the bottom of the stairs. If you do not have them, ask someone to add them for you. What else can I do to help prevent falls?  Wear shoes that:  Do not have high heels.  Have rubber bottoms.  Are comfortable and fit you well.  Are  closed at the toe. Do not wear sandals.  If you use a stepladder:  Make sure that it is fully opened. Do not climb a closed stepladder.  Make sure that both sides of the stepladder are locked into place.  Ask someone to hold it for you, if possible.  Clearly mark and make sure that you can see:  Any grab bars or handrails.  First and last steps.  Where the edge of each step is.  Use tools that help you move around (mobility aids) if they are needed. These include:  Canes.  Walkers.  Scooters.  Crutches.  Turn on the lights when you go into a dark area. Replace any light bulbs as soon as they burn out.  Set up your furniture so you have a clear path. Avoid moving your furniture around.  If any of your floors are uneven, fix them.  If there are any pets around you, be aware of where they are.  Review your medicines with your doctor. Some medicines can make you feel dizzy. This can increase your chance of falling. Ask your doctor what other things that you can do to help prevent falls. This information is not intended to replace advice given to you by your health care provider. Make sure you discuss any questions you have with your health care provider. Document Released: 10/15/2008 Document Revised: 05/27/2015 Document Reviewed: 01/23/2014 Elsevier Interactive Patient Education  2017 [employer].

## 2018-11-19 ENCOUNTER — Other Ambulatory Visit: Payer: Self-pay

## 2018-11-19 ENCOUNTER — Ambulatory Visit (INDEPENDENT_AMBULATORY_CARE_PROVIDER_SITE_OTHER): Payer: PPO | Admitting: Family Medicine

## 2018-11-19 ENCOUNTER — Encounter: Payer: Self-pay | Admitting: Family Medicine

## 2018-11-19 VITALS — BP 140/68 | HR 78 | Temp 97.3°F | Ht <= 58 in | Wt 128.6 lb

## 2018-11-19 DIAGNOSIS — Z7189 Other specified counseling: Secondary | ICD-10-CM

## 2018-11-19 DIAGNOSIS — J432 Centrilobular emphysema: Secondary | ICD-10-CM | POA: Diagnosis not present

## 2018-11-19 DIAGNOSIS — G8929 Other chronic pain: Secondary | ICD-10-CM | POA: Diagnosis not present

## 2018-11-19 DIAGNOSIS — E785 Hyperlipidemia, unspecified: Secondary | ICD-10-CM | POA: Diagnosis not present

## 2018-11-19 DIAGNOSIS — R413 Other amnesia: Secondary | ICD-10-CM

## 2018-11-19 DIAGNOSIS — R000 Tachycardia, unspecified: Secondary | ICD-10-CM | POA: Diagnosis not present

## 2018-11-19 DIAGNOSIS — Z23 Encounter for immunization: Secondary | ICD-10-CM

## 2018-11-19 DIAGNOSIS — Z0000 Encounter for general adult medical examination without abnormal findings: Secondary | ICD-10-CM

## 2018-11-19 MED ORDER — VITAMIN D3 25 MCG (1000 UT) PO CAPS: 1000.0000 [IU] | ORAL_CAPSULE | Freq: Every day | ORAL | Status: DC

## 2018-11-19 NOTE — Patient Instructions (Addendum)
Check with your insurance to see if they will cover the shingrix shot. Stay off metoprolol for now.  If your heart rate is >100, then restart and update me.   Restart crestor and start taking 1000 IU of vitamin D a day.  Take care.  Glad to see you.  Recheck in about 3 months.

## 2018-11-19 NOTE — Progress Notes (Signed)
Pandemic considerations d/w pt. He is masking, etc.   COPD/breathing d/w pt. We talked about inhaler compliance.  Some occ sputum, usually cream colored.  No fevers.  Relief of pulmonary sx with inhaler use. Compliant with Anoro with prn SABA use.    She has noted changes in memory in the last few months.  She is A&Ox3.  3/3 attention.  Can read a watch.  Can do math.  3/3 recall.  She is still forgetting road names and recipes, occasional lapses of memory.  Unclear if this was related to zanaflex use.  She is off flexeril and tizanidine but that didn't change much for patient, per her report. She isn't forgetting her daily meds o/w.  She wanted to monitor and I think this is reasonable.  No red flag events.  She isn't getting lost.  Still with neck pain at baseline.  Her HA is some better.  No clear ADE on med.  No change in meds recently o/w.  Gabapentin helped with pain prev.    Elevated Cholesterol: Using medications without problems: yes Muscle aches: not from statin, able to tolerate crestor, had been off crestor for a few months Diet compliance: yes Exercise: walking some.    H/o tachycardia.  No sx recently.  She has been off metoprolol.  No CP.  No BLE edema.     Flu shot today.  shingrix d/w pt.  See avs.   Colonoscopy 2008. D/w pt.  She declined f/u at this point.   Mammogram d/w pt.  Declined.  She wouldn't want intervention.   DXA 2020.  Vit D now normal, d/w pt.  Defer tx given memory concerns above, ie not to add extra meds at this point.    Advance directive- d/w pt. Daughter and son equally designated if patient were incapacitated.   PMH and SH reviewed  ROS: Per HPI unless specifically indicated in ROS section   Meds, vitals, and allergies reviewed.   GEN: nad, alert and oriented HEENT: ncat NECK: supple w/o LA CV: rrr PULM: ctab, no inc wob ABD: soft, +bs EXT: no edema SKIN: no acute rash

## 2018-11-20 MED ORDER — HYDROCODONE-ACETAMINOPHEN 5-325 MG PO TABS: 1.0000 | ORAL_TABLET | Freq: Four times a day (QID) | ORAL | 0 refills | Status: DC | PRN

## 2018-11-20 NOTE — Assessment & Plan Note (Signed)
Still with neck pain at baseline.  Her HA is some better.  No clear ADE on med.  No change in meds recently o/w.  Gabapentin helped with pain prev.  Continue as is.  She agrees.  Not sedated.

## 2018-11-20 NOTE — Assessment & Plan Note (Signed)
Advance directive- d/w pt. Daughter and son equally designated if patient were incapacitated.

## 2018-11-20 NOTE — Assessment & Plan Note (Addendum)
Able to tolerate Crestor but has been off medication.  Lipids elevated in the meantime.  She will restart Crestor. >25 minutes spent in face to face time with patient, >50% spent in counselling or coordination of care

## 2018-11-20 NOTE — Assessment & Plan Note (Signed)
No sx recently.  She has been off metoprolol.  No CP.  No BLE edema.    We will continue as is for now.  She agrees.

## 2018-11-20 NOTE — Assessment & Plan Note (Signed)
Flu shot today.  shingrix d/w pt.  See avs.   Colonoscopy 2008. D/w pt.  She declined f/u at this point.   Mammogram d/w pt.  Declined.  She wouldn't want intervention.   DXA 2020.  Vit D now normal, d/w pt.  Defer tx given memory concerns above, ie not to add extra meds at this point.     She agrees. Advance directive- d/w pt. Daughter and son equally designated if patient were incapacitated.

## 2018-11-20 NOTE — Assessment & Plan Note (Signed)
She has noted changes in memory in the last few months.  She is A&Ox3.  3/3 attention.  Can read a watch.  Can do math.  3/3 recall.  She is still forgetting road names and recipes.  Unclear if this was related to zanaflex use.  She is off flexeril and tizanidine but that didn't change much for patient, per her report. She isn't forgetting her daily meds o/w.  She wanted to monitor and I think this is reasonable.  No red flag events.  She isn't getting lost.  We can recheck periodically.

## 2018-11-20 NOTE — Assessment & Plan Note (Signed)
We talked about inhaler compliance.  Some occ sputum, usually cream colored.  No fevers.  Relief of pulmonary sx with inhaler use. Compliant with Anoro with prn SABA use.   Continue as is.  She agrees.

## 2018-11-27 ENCOUNTER — Other Ambulatory Visit: Payer: Self-pay

## 2018-12-02 ENCOUNTER — Telehealth: Payer: Self-pay

## 2018-12-02 NOTE — Telephone Encounter (Signed)
Patient contacted the office saying that she wants a doctor's note saying that she cannot wear a mask, because it is hard for her to breath with one on.  I advised patient I would send a message to Dr. Damita Dunnings and see what he advises, as masks are being used to prevent the spread of COVID, and they are highly recommended to keep others, as well as herself healthy - but patient states she does not want to wear one and wants a note saying she cannot.  Will route to Dr. Damita Dunnings. Thanks!

## 2018-12-02 NOTE — Telephone Encounter (Signed)
Patient advised.

## 2018-12-02 NOTE — Telephone Encounter (Signed)
We don't write notes for patients stating that is okay for them not to wear a mask.  I didn't write the note.  She should wear a mask.  If she can't wear a mask, then she should avoid any situations where one is required/advised.

## 2019-01-06 ENCOUNTER — Ambulatory Visit: Payer: PPO | Admitting: Family Medicine

## 2019-01-06 ENCOUNTER — Telehealth: Payer: Self-pay

## 2019-01-06 ENCOUNTER — Other Ambulatory Visit: Payer: Self-pay | Admitting: Family Medicine

## 2019-01-06 NOTE — Telephone Encounter (Signed)
Electronic refill request. Gabapentin Last office visit:   11/19/2018 Last Filled:     270 tablet 1 07/08/2018   Please advise.

## 2019-01-06 NOTE — Telephone Encounter (Signed)
[name] (DPR signed) left v/m that pt has appt on 01/09/19 at 10:30 and there is info that [name] wants Dr Damita Dunnings to be aware of prior to pt coming to office.  [name] said pt has been having problems remembering things; Dr Damita Dunnings is aware of pt having problems with memory but things have gotten a lot worse with her memory since 10/2018. Example is pt was talking with daughter about thanksgiving meal and pt was not going to cook and then pt decided to cook. Then discussing Christmas meal and who was going to cook the meal and where will meal be at. Pt did not remember how tired she was from fixing the thanksgiving meal. Another example 01/06/19 pt sent [name] a way to do deviled eggs and [name] gave back to pt today and pt was agitated because pt did not remember giving to her daughter and was upset like [name] took the instructions without permission. Pt is still driving and has been doing good with driving. [name] will be with pt on 01/09/19 at 10:30 and put hold on 11:30 appt since need to address memory issues. [name] is very concerned about increased memory loss and is OK for Dr Damita Dunnings to talk about memory loss with pt using above examples. FYI to Dr Damita Dunnings.

## 2019-01-07 NOTE — Telephone Encounter (Signed)
Noted. Thanks. Will d/w pt at OV.  ?

## 2019-01-07 NOTE — Telephone Encounter (Signed)
Sent. Thanks.  Has f/u pending.  

## 2019-01-09 ENCOUNTER — Other Ambulatory Visit: Payer: Self-pay

## 2019-01-09 ENCOUNTER — Ambulatory Visit (INDEPENDENT_AMBULATORY_CARE_PROVIDER_SITE_OTHER): Payer: PPO | Admitting: Family Medicine

## 2019-01-09 ENCOUNTER — Encounter: Payer: Self-pay | Admitting: Family Medicine

## 2019-01-09 VITALS — BP 122/52 | HR 98 | Temp 96.8°F | Ht <= 58 in | Wt 130.1 lb

## 2019-01-09 DIAGNOSIS — R413 Other amnesia: Secondary | ICD-10-CM

## 2019-01-09 DIAGNOSIS — G8929 Other chronic pain: Secondary | ICD-10-CM

## 2019-01-09 NOTE — Progress Notes (Signed)
This visit occurred during the SARS-CoV-2 public health emergency.  Safety protocols were in place, including screening questions prior to the visit, additional usage of staff PPE, and extensive cleaning of exam room while observing appropriate contact time as indicated for disinfecting solutions.  We talked about covid vaccine information.  See avs.   Memory d/w pt.  "Not good per patient.  I have to stop and think about what I am going to say."  She thought it was gradually getting worse.  Some days are better than others.    She tried cooking for Thanksgiving ("It was too much for me") and she said she wasn't going to be able to cook for christmas.  Then later on she forgot the conversation.  No red flag events.  She has driven with family and done well.  She has been occ irritable when forgetting details.  No hallucinations.  No tremor.    She is still on baseline pain meds, compliant, with relief of back pain.  "I'd be miserable without it."  Continues to have midline back pain, esp lower back- ttp locally.    Meds, vitals, and allergies reviewed.   ROS: Per HPI unless specifically indicated in ROS section   nad ncat Oriented to year, month, day of the week.   Can do math.   2/3 recall.  Can read a watch.   rrr ctab abd soft lower back- ttp locally Symmetric but slow gait due to back pain when she arises from a chair.  No focal weakness in the bilateral lower extremities.

## 2019-01-09 NOTE — Patient Instructions (Addendum)
[email or web address]  Don't change your meds for now.  We may need to have you see the spine clinic.   We'll get the head CT set up and go from there.  We may need to have you see the neurology clinic.   Take care.  Glad to see you.

## 2019-01-12 NOTE — Assessment & Plan Note (Signed)
No change in meds at this point.  She does not have any recent med changes that would explain her memory loss.  She is on chronic opiates and gabapentin.  Unclear how much that contributes.  It likely makes sense to get the head CT set up first and then consider spine evaluation after that, depending on her pain level.  No emergent symptoms at this point.

## 2019-01-12 NOTE — Assessment & Plan Note (Signed)
Reportedly worse.  She has 2 out of 3 on recall today.  She is oriented otherwise.  No red flag events and still okay for outpatient follow-up but she does appear to be having changes noted.  Reasonable to get head CT set up first and go from there.  She agrees.  Previous labs related to memory loss noted and discussed with patient.

## 2019-01-15 ENCOUNTER — Ambulatory Visit (INDEPENDENT_AMBULATORY_CARE_PROVIDER_SITE_OTHER)
Admission: RE | Admit: 2019-01-15 | Discharge: 2019-01-15 | Disposition: A | Discharge status: Home / Self Care | Payer: PPO | Source: Ambulatory Visit | Attending: Family Medicine | Admitting: Family Medicine

## 2019-01-15 ENCOUNTER — Other Ambulatory Visit: Payer: Self-pay

## 2019-01-15 DIAGNOSIS — R413 Other amnesia: Secondary | ICD-10-CM

## 2019-01-16 ENCOUNTER — Telehealth: Payer: Self-pay

## 2019-01-16 NOTE — Telephone Encounter (Signed)
I spoke with the patients daughter and informed her of the Ct results and she understood and they are willing to see a neurologist.  Lesia Hausen

## 2019-01-16 NOTE — Telephone Encounter (Signed)
[name](DPR signed) left v/m requesting cb with CT of head results and information about referral.

## 2019-01-17 ENCOUNTER — Other Ambulatory Visit: Payer: Self-pay | Admitting: Family Medicine

## 2019-01-17 DIAGNOSIS — R413 Other amnesia: Secondary | ICD-10-CM

## 2019-01-24 ENCOUNTER — Telehealth: Payer: Self-pay | Admitting: Family Medicine

## 2019-01-24 NOTE — Telephone Encounter (Signed)
Spoke to the patients daughter [name], Referral was sent to [facility], details given to the daughter of where they are located.

## 2019-01-24 NOTE — Telephone Encounter (Signed)
Patient's daughter called in regards to her neurology referral  She was following up on the status.   She stated that she wanted any location that they could get into quickly. They preferred Wed- Friday anytime but if it the first available was not on those days they will take any time

## 2019-02-04 ENCOUNTER — Other Ambulatory Visit: Payer: Self-pay

## 2019-02-04 ENCOUNTER — Encounter: Payer: Self-pay | Admitting: Neurology

## 2019-02-04 ENCOUNTER — Ambulatory Visit: Payer: PPO | Admitting: Neurology

## 2019-02-04 VITALS — BP 142/72 | HR 80 | Temp 98.3°F | Ht <= 58 in | Wt 130.0 lb

## 2019-02-04 DIAGNOSIS — R519 Headache, unspecified: Secondary | ICD-10-CM | POA: Diagnosis not present

## 2019-02-04 DIAGNOSIS — E538 Deficiency of other specified B group vitamins: Secondary | ICD-10-CM | POA: Diagnosis not present

## 2019-02-04 DIAGNOSIS — R413 Other amnesia: Secondary | ICD-10-CM | POA: Diagnosis not present

## 2019-02-04 DIAGNOSIS — R410 Disorientation, unspecified: Secondary | ICD-10-CM | POA: Diagnosis not present

## 2019-02-04 NOTE — Patient Instructions (Signed)
MRi of the brain Blood work Formal memory testing F/u 3-4 months  Memory Compensation Strategies  1. Use "WARM" strategy.  W= write it down  A= associate it  R= repeat it  M= make a mental note  2.   You can keep a Social worker.  Use a 3-ring notebook with sections for the following: calendar, important names and phone numbers,  medications, doctors' names/phone numbers, lists/reminders, and a section to journal what you did  each day.   3.    Use a calendar to write appointments down.  4.    Write yourself a schedule for the day.  This can be placed on the calendar or in a separate section of the Memory Notebook.  Keeping a  regular schedule can help memory.  5.    Use medication organizer with sections for each day or morning/evening pills.  You may need help loading it  6.    Keep a basket, or pegboard by the door.  Place items that you need to take out with you in the basket or on the pegboard.  You may also want to  include a message board for reminders.  7.    Use sticky notes.  Place sticky notes with reminders in a place where the task is performed.  For example: " turn off the  stove" placed by the stove, "lock the door" placed on the door at eye level, " take your medications" on  the bathroom mirror or by the place where you normally take your medications.  8.    Use alarms/timers.  Use while cooking to remind yourself to check on food or as a reminder to take your medicine, or as a  reminder to make a call, or as a reminder to perform another task, etc.
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
## 2019-02-11 LAB — CBC
Hematocrit: 40.6 % (ref 34.0–46.6)
Hemoglobin: 13.7 g/dL (ref 11.1–15.9)
MCH: 31.6 pg (ref 26.6–33.0)
MCHC: 33.7 g/dL (ref 31.5–35.7)
MCV: 94 fL (ref 79–97)
Platelets: 237 10*3/uL (ref 150–450)
RBC: 4.34 x10E6/uL (ref 3.77–5.28)
RDW: 11.9 % (ref 11.7–15.4)
WBC: 8.6 10*3/uL (ref 3.4–10.8)

## 2019-02-11 LAB — COMPREHENSIVE METABOLIC PANEL
ALT: 9 IU/L (ref 0–32)
AST: 19 IU/L (ref 0–40)
Albumin/Globulin Ratio: 1.9 (ref 1.2–2.2)
Albumin: 4.4 g/dL (ref 3.7–4.7)
Alkaline Phosphatase: 95 IU/L (ref 39–117)
BUN/Creatinine Ratio: 21 (ref 12–28)
BUN: 22 mg/dL (ref 8–27)
Bilirubin Total: 0.3 mg/dL (ref 0.0–1.2)
CO2: 25 mmol/L (ref 20–29)
Calcium: 10.1 mg/dL (ref 8.7–10.3)
Chloride: 101 mmol/L (ref 96–106)
Creatinine, Ser: 1.07 mg/dL — ABNORMAL HIGH (ref 0.57–1.00)
GFR calc Af Amer: 58 mL/min/{1.73_m2} — ABNORMAL LOW (ref >59)
GFR calc non Af Amer: 50 mL/min/{1.73_m2} — ABNORMAL LOW (ref >59)
Globulin, Total: 2.3 g/dL (ref 1.5–4.5)
Glucose: 90 mg/dL (ref 65–99)
Potassium: 5.5 mmol/L — ABNORMAL HIGH (ref 3.5–5.2)
Sodium: 140 mmol/L (ref 134–144)
Total Protein: 6.7 g/dL (ref 6.0–8.5)

## 2019-02-11 LAB — B12 AND FOLATE PANEL
Folate: 17.7 ng/mL (ref >3.0)
Vitamin B-12: 588 pg/mL (ref 232–1245)

## 2019-02-11 LAB — RPR: RPR Ser Ql: NONREACTIVE

## 2019-02-11 LAB — METHYLMALONIC ACID, SERUM: Methylmalonic Acid: 200 nmol/L (ref 0–378)

## 2019-02-11 LAB — HOMOCYSTEINE: Homocysteine: 15 umol/L (ref 0.0–19.2)

## 2019-02-16 ENCOUNTER — Ambulatory Visit: Payer: PPO | Attending: Internal Medicine

## 2019-02-16 DIAGNOSIS — Z23 Encounter for immunization: Secondary | ICD-10-CM

## 2019-02-16 NOTE — Progress Notes (Signed)
   Covid-19 Vaccination Clinic  Name:  Megan Peterson    MRN: [id number] DOB: 29-Dec-1940  02/16/2019  [name] was observed post Covid-19 immunization for 15 minutes without incidence. She was provided with Vaccine Information Sheet and instruction to access the V-Safe system.   [name] was instructed to call 911 with any severe reactions post vaccine: Marland Kitchen Difficulty breathing  . Swelling of your face and throat  . A fast heartbeat  . A bad rash all over your body  . Dizziness and weakness    Immunizations Administered    Name Date Dose VIS Date Route   Pfizer COVID-19 Vaccine 02/16/2019  2:51 PM 0.3 mL 12/13/2018 Intramuscular   Manufacturer: [place]   Lot: [id number]   [place]: [id number]

## 2019-02-18 ENCOUNTER — Telehealth: Payer: Self-pay | Admitting: Family Medicine
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
## 2019-02-18 NOTE — Telephone Encounter (Signed)
Okay thank you

## 2019-02-18 NOTE — Telephone Encounter (Signed)
Hi Dr.Duncan, I called Megan Peterson to remind her of her appointment Thursday and to complete the screening. She said she had her first vaccine and has had a little headache on and off for 2 weeks now, but has no other symptoms. Should she come in office or switch to virtual?  Thank you, [place]

## 2019-02-20 ENCOUNTER — Ambulatory Visit: Payer: PPO | Admitting: Family Medicine

## 2019-02-27 ENCOUNTER — Other Ambulatory Visit: Payer: Self-pay

## 2019-02-27 ENCOUNTER — Encounter: Payer: Self-pay | Admitting: Family Medicine

## 2019-02-27 ENCOUNTER — Ambulatory Visit (INDEPENDENT_AMBULATORY_CARE_PROVIDER_SITE_OTHER): Payer: PPO | Admitting: Family Medicine

## 2019-02-27 VITALS — BP 110/66 | HR 63 | Temp 95.9°F | Ht <= 58 in | Wt 127.1 lb

## 2019-02-27 DIAGNOSIS — G8929 Other chronic pain: Secondary | ICD-10-CM

## 2019-02-27 DIAGNOSIS — L989 Disorder of the skin and subcutaneous tissue, unspecified: Secondary | ICD-10-CM | POA: Diagnosis not present

## 2019-02-27 MED ORDER — HYDROCODONE-ACETAMINOPHEN 5-325 MG PO TABS: 1.0000 | ORAL_TABLET | Freq: Four times a day (QID) | ORAL | 0 refills | Status: DC | PRN

## 2019-02-27 NOTE — Patient Instructions (Addendum)
I sent your refills to the pharmacy.  Don't change your meds for now.  We'll call about seeing the skin clinic.  I'll await the neurology notes and the MRI report.  Take care.  Glad to see you. Plan on recheck in about 3 months, sooner if needed.

## 2019-02-27 NOTE — Progress Notes (Signed)
This visit occurred during the SARS-CoV-2 public health emergency.  Safety protocols were in place, including screening questions prior to the visit, additional usage of staff PPE, and extensive cleaning of exam room while observing appropriate contact time as indicated for disinfecting solutions.  Indication for chronic opioid: Chronic back pain, at baseline. Medication and dose: hydrocodone5/325 # pills per month: 120 Last UDS date: 11/12/2018 Pain contract signed (Y/N): yes Date narcotic database last reviewed (include red flags):02/27/19  Pain inventory (1-10) Average pain:still activity dependent.   Pain now:"not bad now"with med use, with sitting down.   Pain wasburning in the lower back and her legs. Pain is worse when due for med, early AM, if prolonged sitting, with activity.   Relief from meds:yes, seen above.  Gabapentin is helping some. She clearly does better with concurrent gabapentin and hydrocodone.She does not do as well with pain control when she is taking the medication separately. This is unchanged, d/w pt.   In the last 24 hours, how much has pain interfered with the following (1-10 greatest interference)? General activity- some limitation from back pain, some from covid Relationships with others- in contact with family via phone.  Enjoyment of life- doing well.   What time of the day is the pain the worst- early AM.  Sleep is described by patient as good, with relief with pain medicine.    Mobility/function Assistance device: no How many minutes can you walk: 1/2-1 mile Able to climb steps: yes Driving:yes Disabled: no  Bowel or bladder symptoms: no Mood: good  Physicians involved in care:  Yes, neuro Any changes since last visit? See below.    She had neurology f/u in the meantime.  She has f/u MRI pending.  She didn't think her memory was worse in the meantime.  She drove to her appointment today on time w/o troubles.    She had her  first covid vaccine dose, d/w pt.    Meds, vitals, and allergies reviewed.   ROS: Per HPI unless specifically indicated in ROS section   GEN: nad, alert and oriented HEENT: ncat NECK: supple w/o LA CV: rrr PULM: ctab, no inc wob ABD: soft, +bs EXT: no edema Lower midline back tender to palpation but able to bear weight. SKIN: no acute rash but she does have 2 lesions noted.  1 each on the bilateral lower shins.  Both 8 mm across with slightly rolled border and each is concerning for basal cell carcinoma.  They have been present for months per patient report.

## 2019-03-02 NOTE — Assessment & Plan Note (Signed)
Indication for chronic opioid: Chronic back pain, at baseline. Medication and dose: hydrocodone5/325 # pills per month: 120 Last UDS date: 11/12/2018 Pain contract signed (Y/N): yes Date narcotic database last reviewed (include red flags):02/27/19  She still has some benefit from medications as prescribed.  No adverse effect at this point.  Would continue as is.  She agrees.  It is likely that the benefit in terms of her quality of life is outweighing the risk of any of her medications currently.  She agrees.

## 2019-03-02 NOTE — Assessment & Plan Note (Addendum)
Refer to Derm.  DDx discussed with patient.

## 2019-03-11 ENCOUNTER — Ambulatory Visit: Payer: PPO | Attending: Internal Medicine

## 2019-03-11 ENCOUNTER — Ambulatory Visit
Admission: RE | Admit: 2019-03-11 | Discharge: 2019-03-11 | Disposition: A | Discharge status: Home / Self Care | Payer: PPO | Source: Ambulatory Visit | Attending: Neurology | Admitting: Neurology

## 2019-03-11 ENCOUNTER — Other Ambulatory Visit: Payer: Self-pay

## 2019-03-11 DIAGNOSIS — R413 Other amnesia: Secondary | ICD-10-CM | POA: Diagnosis not present

## 2019-03-11 DIAGNOSIS — R519 Headache, unspecified: Secondary | ICD-10-CM | POA: Diagnosis not present

## 2019-03-11 DIAGNOSIS — Z23 Encounter for immunization: Secondary | ICD-10-CM

## 2019-03-11 MED ORDER — GADOBENATE DIMEGLUMINE 529 MG/ML IV SOLN: 12.0000 mL | Freq: Once | INTRAVENOUS | Status: DC | PRN

## 2019-03-11 NOTE — Progress Notes (Signed)
   Covid-19 Vaccination [facility]  Name:  Megan Peterson    MRN: [id number] DOB: 1940-03-01  03/11/2019  [name] was observed post Covid-19 immunization for 15 minutes without incident. She was provided with Vaccine Information Sheet and instruction to access the V-Safe system.   [name] was instructed to call 911 with any severe reactions post vaccine: Marland Kitchen Difficulty breathing  . Swelling of face and throat  . A fast heartbeat  . A bad rash all over body  . Dizziness and weakness   Immunizations Administered    Name Date Dose VIS Date Route   Pfizer COVID-19 Vaccine 03/11/2019 12:45 PM 0.3 mL 12/13/2018 Intramuscular   Manufacturer: [place]   Lot: [id number]   [place]: [id number]

## 2019-03-12 ENCOUNTER — Ambulatory Visit: Payer: PPO

## 2019-03-14 DIAGNOSIS — L281 Prurigo nodularis: Secondary | ICD-10-CM | POA: Diagnosis not present

## 2019-03-14 DIAGNOSIS — L308 Other specified dermatitis: Secondary | ICD-10-CM | POA: Diagnosis not present

## 2019-03-17 ENCOUNTER — Other Ambulatory Visit: Payer: Self-pay | Admitting: Family Medicine

## 2019-04-07 ENCOUNTER — Other Ambulatory Visit: Payer: Self-pay | Admitting: Family Medicine

## 2019-04-08 NOTE — Telephone Encounter (Signed)
Sent. Thanks.   

## 2019-04-08 NOTE — Telephone Encounter (Signed)
Electronic refill request. Gabapentin Last office visit:   02/27/2019 Last Filled:    270 tablet 0 01/07/2019  Please advise.

## 2019-04-22 ENCOUNTER — Ambulatory Visit (INDEPENDENT_AMBULATORY_CARE_PROVIDER_SITE_OTHER): Payer: PPO | Admitting: Family Medicine

## 2019-04-22 ENCOUNTER — Other Ambulatory Visit: Payer: Self-pay

## 2019-04-22 ENCOUNTER — Encounter: Payer: Self-pay | Admitting: Family Medicine

## 2019-04-22 ENCOUNTER — Ambulatory Visit: Payer: PPO | Admitting: Family Medicine

## 2019-04-22 DIAGNOSIS — M2550 Pain in unspecified joint: Secondary | ICD-10-CM

## 2019-04-22 MED ORDER — PREDNISONE 10 MG PO TABS: ORAL_TABLET | ORAL | 0 refills | Status: DC

## 2019-04-22 NOTE — Patient Instructions (Signed)
Try prednisone with food and update me as needed.  Take care.  Glad to see you.

## 2019-04-22 NOTE — Progress Notes (Signed)
This visit occurred during the SARS-CoV-2 public health emergency.  Safety protocols were in place, including screening questions prior to the visit, additional usage of staff PPE, and extensive cleaning of exam room while observing appropriate contact time as indicated for disinfecting solutions.  She had a flare of back pain.  She is having spells of inc pain, with a different pain from her previous baseline. Pain has gotten worse, moved around in the ~2 weeks. All over in her back initially, then in the neck down to her buttocks. L lower back, just above the waistline.  Then B hip area pain.  No clear trigger or trauma.  Still on baseline pain meds.  Compliant.  No ADE on med.  No FCNAVD.  No tick bites.  No blood in stool or urine.  This pain is different from prev typical chronic pain.  No trauma.  No heavy lifting.  No falls.  More pain when she is up walking around.  Less pain when she is in her recliner.  Repositioning in her recliner clearly helps.  Meds, vitals, and allergies reviewed.   ROS: Per HPI unless specifically indicated in ROS section   nad ncat Uncomfortable walking but not in distress otherwise in a chair. Neck supple, no LA rrr ctab abd soft, not ttp.  Ext w/o edema.  Skin well perfused. Back not tender to palpation in the midline but she does have left hip pain on internal rotation.  No pain on right hip with internal rotation.  Greater trochanteric bursa area not tender to palpation bilaterally.  Still able to bear weight.

## 2019-04-23 DIAGNOSIS — M2550 Pain in unspecified joint: Secondary | ICD-10-CM | POA: Insufficient documentation

## 2019-04-23 HISTORY — DX: Pain in unspecified joint: M25.50

## 2019-04-23 NOTE — Assessment & Plan Note (Signed)
She reports a diffuse increase in pain across multiple joints without a clear trigger.  At this point she looks like she is still okay for outpatient follow-up and there is no sign of an ominous systemic disease.  It could be that she is having a flare of arthritis and that has changed her gait/posture and then that could have caused other cascading problems with muscle spasms etc.  Discussed options.  Reasonable for short-term trial of prednisone with routine steroid cautions and she agrees.  She will update me as needed.

## 2019-04-28 ENCOUNTER — Telehealth: Payer: Self-pay

## 2019-04-28 NOTE — Telephone Encounter (Signed)
[facility] - Client TELEPHONE ADVICE RECORD [facility] Patient Name: Megan Peterson Gender: Female DOB: 1940/05/08 Age: 79 Y 10 M 11 D Return Phone Number: [phone] (Primary) [place] [address] Client [facility] - Client Client Site [facility] Physician Renford Dills - MD Contact Type Call Who Is Calling Patient / Member / Family / Caregiver Call Type Triage / Clinical Caller Name Ferman Hamming Relationship To Patient Daughter Return Phone Number [phone] (Primary) Chief Complaint Finger Pain Reason for Call Symptomatic / Request for [place] states that her mother can hardly move her fingers and thumb- they are hurting and in pain and is not able to sleep. She thinks going off her medications and taking prednisone has made her worse. fingers have been hurting for several days Translation No Nurse Assessment Nurse: Noberto Retort, RN, Malvin Johns Date/Time (Eastern Time): 04/27/2019 8:34:58 AM Confirm and document reason for call. If symptomatic, describe symptoms. ---Caller states that her mother can hardly move her fingers and thumb on the right hand, they are hurting and in pain and she is not able to sleep. This started a few days ago and the daughter is concerned. Has the patient had close contact with a person known or suspected to have the novel coronavirus illness OR traveled / lives in area with major community spread (including international travel) in the last 14 days from the onset of symptoms? * If Asymptomatic, screen for exposure and travel within the last 14 days. ---No Does the patient have any new or worsening symptoms? ---Yes Will a triage be completed? ---Yes Related visit to physician within the last 2 weeks? ---No Does the PT have any chronic conditions? (i.e. diabetes, asthma, this includes High risk factors  for pregnancy, etc.) ---Yes List chronic conditions. ---COPD Is this a behavioral health or substance abuse call? ---No Guidelines Guideline Title Affirmed Question Affirmed Notes Nurse Date/Time (Eastern Time) Finger Pain [1] Finger pain or color changes occurs after Noberto Retort, RN, Malvin Johns 04/27/2019 8:42:08 AM PLEASE NOTE: All timestamps contained within this report are represented as [place] Standard Time. CONFIDENTIALTY NOTICE: This fax transmission is intended only for the addressee. It contains information that is legally privileged, confidential or otherwise protected from use or disclosure. If you are not the intended recipient, you are strictly prohibited from reviewing, disclosing, copying using or disseminating any of this information or taking any action in reliance on or regarding this information. If you have received this fax in error, please notify us immediately by telephone so that we can arrange for its return to [place]. Phone: [phone], Toll-Free: [phone], Fax: [phone] Page: 2 of 2 Call Id: [id number] Guidelines Guideline Title Affirmed Question Affirmed Notes Nurse Date/Time Eilene Ghazi Time) exposure to cold AND [2] is a recurrent problem Disp. Time Eilene Ghazi Time) Disposition Final User 04/27/2019 8:44:38 AM See PCP within 2 Weeks Yes Noberto Retort, RN, Renne Musca Disagree/Comply Comply Caller Understands Yes PreDisposition Go to Urgent Care/Walk-In Clinic Care Advice Given Per Guideline SEE PCP WITHIN 2 WEEKS: * You need to be seen for this ongoing problem within the next 2 weeks. Call your doctor (or NP/ PA) during regular office hours and make an appointment. PREVENTION: * Avoid cold exposure, if possible. * Wear warm clothes when exposed to the cold. * Wear good mittens or gloves when outside in the cold. * Do not smoke (Reason: causes blood vessel narrowing). REWARMING TREATMENT: * Place the hand(s) and  fingers in warm water. CALL BACK IF: * Finger color does  not return to normal after rewarming for 1 hour * You become worse. CARE ADVICE given per Finger Pain (Adult) guideline. Referrals REFERRED TO PCP OFFICE

## 2019-04-28 NOTE — Telephone Encounter (Signed)
I spoke with pt and pt did not go to UC; pt has pain in thumb; pt knows it is arthritis and she does not want to schedule an appt; pt said she would cb if needed. FYI to Dr Darnell Level.

## 2019-04-28 NOTE — Telephone Encounter (Signed)
Noted  

## 2019-05-19 ENCOUNTER — Other Ambulatory Visit: Payer: Self-pay | Admitting: Family Medicine

## 2019-06-25 ENCOUNTER — Ambulatory Visit: Payer: PPO | Admitting: Adult Health

## 2019-07-10 ENCOUNTER — Other Ambulatory Visit: Payer: Self-pay | Admitting: Family Medicine

## 2019-07-14 NOTE — Telephone Encounter (Signed)
Name of Medication: Hydrocodone Name of Pharmacy: [facility] Last Fill or Written Date and Quantity: 04/30/19 #120 Last Office Visit and Type: 04/22/19 Next Office Visit and Type: none scheduled Last Controlled Substance Agreement Date: 08/15/16 Last UDS:11/06/17

## 2019-07-15 MED ORDER — HYDROCODONE-ACETAMINOPHEN 5-325 MG PO TABS: 1.0000 | ORAL_TABLET | Freq: Four times a day (QID) | ORAL | 0 refills | Status: DC | PRN

## 2019-07-15 NOTE — Telephone Encounter (Signed)
Sent. Thanks.   

## 2019-07-29 ENCOUNTER — Other Ambulatory Visit: Payer: Self-pay

## 2019-07-29 ENCOUNTER — Ambulatory Visit: Payer: PPO | Admitting: Pulmonary Disease

## 2019-07-29 ENCOUNTER — Other Ambulatory Visit: Payer: Self-pay | Admitting: Pulmonary Disease

## 2019-07-29 ENCOUNTER — Encounter: Payer: Self-pay | Admitting: Pulmonary Disease

## 2019-07-29 ENCOUNTER — Ambulatory Visit (INDEPENDENT_AMBULATORY_CARE_PROVIDER_SITE_OTHER): Payer: PPO

## 2019-07-29 ENCOUNTER — Telehealth: Payer: Self-pay | Admitting: Pulmonary Disease

## 2019-07-29 VITALS — BP 128/76 | HR 74 | Temp 98.2°F | Ht 59.0 in | Wt 131.6 lb

## 2019-07-29 DIAGNOSIS — Z0000 Encounter for general adult medical examination without abnormal findings: Secondary | ICD-10-CM | POA: Diagnosis not present

## 2019-07-29 DIAGNOSIS — Z79899 Other long term (current) drug therapy: Secondary | ICD-10-CM

## 2019-07-29 DIAGNOSIS — R413 Other amnesia: Secondary | ICD-10-CM

## 2019-07-29 DIAGNOSIS — J9611 Chronic respiratory failure with hypoxia: Secondary | ICD-10-CM | POA: Diagnosis not present

## 2019-07-29 DIAGNOSIS — J479 Bronchiectasis, uncomplicated: Secondary | ICD-10-CM

## 2019-07-29 DIAGNOSIS — R0600 Dyspnea, unspecified: Secondary | ICD-10-CM | POA: Diagnosis not present

## 2019-07-29 DIAGNOSIS — J432 Centrilobular emphysema: Secondary | ICD-10-CM

## 2019-07-29 DIAGNOSIS — J449 Chronic obstructive pulmonary disease, unspecified: Secondary | ICD-10-CM | POA: Diagnosis not present

## 2019-07-29 HISTORY — DX: Other long term (current) drug therapy: Z79.899

## 2019-07-29 MED ORDER — ANORO ELLIPTA 62.5-25 MCG/INH IN AEPB
1.0000 | INHALATION_SPRAY | Freq: Every day | RESPIRATORY_TRACT | 0 refills | Status: DC
Start: 2019-07-29 — End: 2019-09-26

## 2019-07-29 NOTE — Assessment & Plan Note (Signed)
Currently out of inhalers Struggling with medication compliance  Plan: Anoro Ellipta samples today We will send message to clinical pharmacy team to evaluate cost of inhalers We will bring patient back for close follow-up with the pharmacy team for medication assistance and inhaler teaching

## 2019-07-29 NOTE — Assessment & Plan Note (Signed)
Plan: Chest x-ray today 
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
## 2019-07-29 NOTE — Assessment & Plan Note (Signed)
Plan: Return back to neurology as discussed in office today

## 2019-07-29 NOTE — Assessment & Plan Note (Signed)
She has confusion regarding inhalers Patient with worsening shortness of breath over the last 1 to 2 months mMRC 3 today Some occasional wheezing Patient reporting she is out of maintenance inhalers as of 07/26/19 Using rescue inhaler 2-3 times a day  Plan: Resume Anoro Ellipta Anoro Ellipta samples provided today We will send message to clinical pharmacy team to assess inhaler cost for LABA/LAMA combinations We will coordinate close follow-up with the pharmacy team for inhaler teaching, as well as medication review 6 to 8-week follow-up with our office Walk today in office We will need to consider repeating pulmonary function testing in 3 to 6 months

## 2019-07-29 NOTE — Telephone Encounter (Signed)
07/29/2019  Saw patient in clinic today. She was last seen in November/2018. Patient reporting that she is having difficulty affording her LAMA/LABA inhaler Anoro Ellipta. She ran out of this last week. She was provided samples today office visit.  Can we perform test claims to see what her actual insurance coverage is for LAMA/LABA agents. Preferably Anoro Ellipta.  I have also written on her AVS for her to be scheduled for a in person appointment with the pharmacy team for medication reconciliation as well as inhaler device teaching.  Wyn Quaker, FNP

## 2019-07-29 NOTE — Assessment & Plan Note (Signed)
Walk in office today patient required 2 L with physical exertion  Plan: We will start oxygen 2 L with exertion I have personally contacted adapt DME Representative Melissa to notify this patient is a new oxygen start We will discharge patient home with adapt DME tank We will order overnight oximetry to further evaluate oxygen needs at night Can consider a walk at next follow-up to further evaluate oxygen needs

## 2019-07-29 NOTE — Patient Instructions (Addendum)
You were seen today by Lauraine Rinne, NP  for:   1. Centrilobular emphysema ([place])  - DG Chest 2 View; Future  Walk today in office  Resume Anoro Ellipta  >>> Take 1 puff daily in the morning right when you wake up >>>Rinse your mouth out after use >>>This is a daily maintenance inhaler, NOT a rescue inhaler >>>Contact our office if you are having difficulties affording or obtaining this medication >>>It is important for you to be able to take this daily and not miss any doses   Only use your albuterol as a rescue medication to be used if you can't catch your breath by resting or doing a relaxed purse lip breathing pattern.  - The less you use it, the better it will work when you need it. - Ok to use up to 2 puffs  every 4 hours if you must but call for immediate appointment if use goes up over your usual need - Don't leave home without it !!  (think of it like the spare tire for your car)   Note your daily symptoms > remember "red flags" for COPD:   >>>Increase in cough >>>increase in sputum production >>>increase in shortness of breath or activity  intolerance.   If you notice these symptoms, please call the office to be seen.    We can consider pulmonary function test in 3 to 6 months based off of how you do after we resume Anoro Ellipta   2. Medication management  Please present to our office in 2-4 weeks for an appointment with the clinical pharmacy team for:  Marland Kitchen Medication Management  . Medication reconciliation  . Medication Access  . Inhaler teaching    3. Bronchiectasis without complication ([place])  - DG Chest 2 View; Future  Bronchiectasis: This is the medical term which indicates that you have damage, dilated airways making you more susceptible to respiratory infection. Use a flutter valve 10 breaths twice a day or 4 to 5 breaths 4-5 times a day to help clear mucus out Let us know if you have cough with change in mucus color or fevers or chills.  At that point you  would need an antibiotic. Maintain a healthy nutritious diet, eating whole foods Take your medications as prescribed    4. Health care maintenance 5. Memory loss  Keep follow-up with primary care  As recommended in office today I would recommend that you present back to neurology and you can always asked to see a different provider  6. Chronic Resp Failure   Continue oxygen therapy as prescribed - 2L o2 with exertion  >>>maintain oxygen saturations greater than 88 percent  >>>if unable to maintain oxygen saturations please contact the office  >>>do not smoke with oxygen  >>>can use nasal saline gel or nasal saline rinses to moisturize nose if oxygen causes dryness  Overnight Oximetry test on RA to assess O2 at night   We recommend today:  Orders Placed This Encounter  Procedures  . DG Chest 2 View    Standing Status:   Future    Standing Expiration Date:   11/29/2019    Order Specific Question:   Reason for Exam (SYMPTOM  OR DIAGNOSIS REQUIRED)    Answer:   copd, doe, bronchiectasis    Order Specific Question:   Preferred imaging location?    Answer:   Internal    Order Specific Question:   Radiology Contrast Protocol - do NOT remove file path  Answer:   [email or web address]   Orders Placed This Encounter  Procedures  . DG Chest 2 View   No orders of the defined types were placed in this encounter.   Follow Up:    Return in about 2 months (around 09/29/2019), or if symptoms worsen or fail to improve, for Follow up with Dr. Elsworth Soho.  Please present to our office in 2-4 weeks for an appointment with the clinical pharmacy team for:  Marland Kitchen Medication Management  . Medication reconciliation  . Medication Access  . Inhaler teaching        Please do your part to reduce the spread of COVID-19:      Reduce your risk of any infection  and COVID19 by using the similar precautions used for avoiding the common cold or flu:  Marland Kitchen Wash  your hands often with soap and warm water for at least 20 seconds.  If soap and water are not readily available, use an alcohol-based hand sanitizer with at least 60% alcohol.  . If coughing or sneezing, cover your mouth and nose by coughing or sneezing into the elbow areas of your shirt or coat, into a tissue or into your sleeve (not your hands). Langley Gauss A MASK when in public  . Avoid shaking hands with others and consider head nods or verbal greetings only. . Avoid touching your eyes, nose, or mouth with unwashed hands.  . Avoid close contact with people who are sick. . Avoid places or events with large numbers of people in one location, like concerts or sporting events. . If you have some symptoms but not all symptoms, continue to monitor at home and seek medical attention if your symptoms worsen. . If you are having a medical emergency, call 911.   [place] / e-Visit: [email or web address]         [facility]: [facility]: [phone]                   [facility]: [phone]     It is flu season:   >>> Best ways to protect herself from the flu: Receive the yearly flu vaccine, practice good hand hygiene washing with soap and also using hand sanitizer when available, eat a nutritious meals, get adequate rest, hydrate appropriately   Please contact the office if your symptoms worsen or you have concerns that you are not improving.   Thank you for choosing [facility] for your healthcare, and for allowing [place] to partner with you on your healthcare journey. I am thankful to be able to provide care to you today.   Wyn Quaker FNP-C

## 2019-07-29 NOTE — Assessment & Plan Note (Signed)
Up-to-date with vaccines Up-to-date with Covid vaccines  Plan: Recommended the patient follow back up with neurology as previously recommended Patient to keep follow-up with primary care

## 2019-07-30 ENCOUNTER — Other Ambulatory Visit: Payer: Self-pay

## 2019-07-30 MED ORDER — ALBUTEROL SULFATE HFA 108 (90 BASE) MCG/ACT IN AERS: 1.0000 | INHALATION_SPRAY | Freq: Four times a day (QID) | RESPIRATORY_TRACT | 1 refills | Status: DC | PRN

## 2019-07-30 NOTE — Telephone Encounter (Signed)
Called and spoke with the pt's daughter and notified of Brian's response and also pharm team findings. Appt was scheduled for 08/05/19 at 3:30 with pharm team.

## 2019-07-30 NOTE — Telephone Encounter (Signed)
Can we please notify the patient of pharmacy claims findings.  Can we also ensure the patient gets scheduled with the pharmacy team.  Patient needs a medication reconciliation, inhaler teaching as well as review of helping with accessing inhalers.Wyn Quaker, FNP

## 2019-07-30 NOTE — Telephone Encounter (Signed)
Ran test claim for 1 month supply.  Anoro- $45.00  Bevespi and Stiolto are non-formulary.  $45.00 is her standard copay. Patient can apply for patient assistance. [place] requires patient's household to have spent $600 OOP on prescriptions and to meet program income guidelines. [employer] only requires patients to meet income guidelines.  Does not look like patient's appointment with pharmacy team was scheduled.

## 2019-08-05 ENCOUNTER — Other Ambulatory Visit: Payer: Self-pay

## 2019-08-05 ENCOUNTER — Ambulatory Visit (INDEPENDENT_AMBULATORY_CARE_PROVIDER_SITE_OTHER): Payer: PPO | Admitting: Pharmacist

## 2019-08-05 DIAGNOSIS — J432 Centrilobular emphysema: Secondary | ICD-10-CM | POA: Diagnosis not present

## 2019-08-05 DIAGNOSIS — Z7189 Other specified counseling: Secondary | ICD-10-CM | POA: Diagnosis not present

## 2019-08-05 MED ORDER — BEVESPI AEROSPHERE 9-4.8 MCG/ACT IN AERO: 2.0000 | INHALATION_SPRAY | Freq: Two times a day (BID) | RESPIRATORY_TRACT | 0 refills | Status: DC

## 2019-08-05 NOTE — Patient Instructions (Signed)
Thank you for meeting with the pharmacy team today!  Below find a summary of what we discussed at your visit:  CONTINUE Anoro 1 puff daily  SWITCH to Bevespi 2 puffs twice daily once approved through patient assistance.  CALL [employer] at [phone] for any update on patient assistance application.  START using pillbox and filling once a week using medication list.  Call [phone] with any questions or concerns.  Mariella Saa, PharmD, [place], CPP Clinical Specialty Pharmacist (Rheumatology and Pulmonology)  08/05/2019 4:24 PM

## 2019-08-05 NOTE — Progress Notes (Signed)
HPI Patient presents today to [facility] to see pharmacy team for medication reconciliation and inhaler education.  Past medical history includes COPD, history of tobacco abuse, TIA, and HTN.  OBJECTIVE Allergies  Allergen Reactions  . Buspar [Buspirone] Other (See Comments)    Dizzy and nauseated.  Marland Kitchen Cymbalta [Duloxetine Hcl] Other (See Comments)    fatigue  . Lexapro [Escitalopram Oxalate] Other (See Comments)    Per patient, intolerant  . Meperidine Hcl Other (See Comments)    unknown  . Morphine And Related Other (See Comments)    Vomiting   . Statins Other (See Comments)    Myalgias on some statins but able to tolerate crestor.      Outpatient Encounter Medications as of 08/05/2019  Medication Sig  . albuterol (VENTOLIN HFA) 108 (90 Base) MCG/ACT inhaler Inhale 1-2 puffs into the lungs every 6 (six) hours as needed for wheezing or shortness of breath.  . Cholecalciferol (VITAMIN D3) 25 MCG (1000 UT) CAPS Take 1 capsule (1,000 Units total) by mouth daily.  Marland Kitchen gabapentin (NEURONTIN) 800 MG tablet TAKE 1 TABLET BY MOUTH 3 TIMES A DAY  . halobetasol (ULTRAVATE) 0.05 % cream Apply topically 2 (two) times daily as needed.  Marland Kitchen HYDROcodone-acetaminophen (NORCO/VICODIN) 5-325 MG tablet TAKE 1 TABLET BY MOUTH EVERY 6 HOURS AS NEEDED  . HYDROcodone-acetaminophen (NORCO/VICODIN) 5-325 MG tablet Take 1 tablet by mouth every 6 (six) hours as needed. for pain  . HYDROcodone-acetaminophen (NORCO/VICODIN) 5-325 MG tablet Take 1 tablet by mouth every 6 (six) hours as needed. for pain  . Ibuprofen-Diphenhydramine HCl (ADVIL PM) 200-25 MG CAPS Take 1 capsule by mouth at bedtime as needed (sleep).   . metoprolol tartrate (LOPRESSOR) 25 MG tablet TAKE 1/2 A TABLET BY MOUTH (12.5 MG) TWICE DAILY  . predniSONE (DELTASONE) 10 MG tablet Take 2 a day for 5 days, then 1 a day for 5 days, with food. Don't take with aleve/ibuprofen. (Patient not taking: Reported on 07/29/2019)  . rosuvastatin (CRESTOR)  5 MG tablet TAKE 1 TABLET BY MOUTH ONCE DAILY AT 6PM  . umeclidinium-vilanterol (ANORO ELLIPTA) 62.5-25 MCG/INH AEPB Inhale 1 puff into the lungs daily.  Marland Kitchen umeclidinium-vilanterol (ANORO ELLIPTA) 62.5-25 MCG/INH AEPB Inhale 1 puff into the lungs daily.  . vitamin B-12 (CYANOCOBALAMIN) 1000 MCG tablet Take 1 tablet (1,000 mcg total) by mouth daily.   No facility-administered encounter medications on file as of 08/05/2019.     Immunization History  Administered Date(s) Administered  . Fluad Quad(high Dose 65+) 11/19/2018  . Influenza, High Dose Seasonal PF 10/13/2014  . Influenza,inj,Quad PF,6+ Mos 09/17/2013, 10/17/2016  . Influenza-Unspecified 12/09/2015, 11/02/2017  . PFIZER SARS-COV-2 Vaccination 02/16/2019, 03/11/2019  . Pneumococcal Conjugate-13 03/11/2013  . Pneumococcal Polysaccharide-23 06/05/2011  . Tdap 06/07/2011  . Zoster 09/06/2011  . Zoster Recombinat (Shingrix) 11/26/2018     PFTs PFT Results Latest Ref Rng & Units 06/19/2016  FVC-Pre L 1.52  FVC-Predicted Pre % 72  FVC-Post L 1.69  FVC-Predicted Post % 80  Pre FEV1/FVC % % 51  Post FEV1/FCV % % 52  FEV1-Pre L 0.77  FEV1-Predicted Pre % 49  FEV1-Post L 0.87  DLCO uncorrected ml/min/mmHg 9.15  DLCO UNC% % 56  DLCO corrected ml/min/mmHg 9.04  DLCO COR %Predicted % 55  DLVA Predicted % 72  TLC L 4.56  TLC % Predicted % 109  RV % Predicted % 150    Assessment   1. Inhaler Optimization  Patient is currently using Anoro Ellipta once daily.  Samples provided  by the office.  Anoro Ellipta co-pay is $45 for a 1 month supply which patient states is unaffordable.  Reviewed eligibility requirements for [employer] patient assistance. She would not qualify as she has not spent $600 out-of-pocket on prescriptions this year.  Patient will be eligible for a [place]&Me patient assistance for Bevespi.  Patient is willing to proceed with [facility] patient assistance.  Patient completed application while in office.  Patient was counseled on  the purpose, proper use, and adverse effects of Bevespi inhaler. Reviewed appropriate use of maintenance vs rescue inhalers.  Stressed importance of using maintenance inhaler daily and rescue inhaler only as needed.  Patient verbalized understanding.  Demonstrated proper inhaler technique using demo inhaler and watched educational video on instructions for use.  Patient able to demonstrate proper inhaler technique using teach back method.   Patient given a sample of Anoro to use until she is approved for Bevespi.  Lot-[id number] Exp-09/2020  2. Medication Reconciliation  A drug regimen assessment was performed, including review of allergies, interactions, disease-state management, dosing and immunization history. Medications were reviewed with the patient, including name, instructions, indication, goals of therapy, potential side effects, importance of adherence, and safe use.  Patient has difficulty managing her medications.  She uses a box with very few compartments and mixes her medications.  Strongly encourage patient to use pillbox and to fill once a week using medication list provided.  Also encouraged patient to look for pharmacy that does pill packaging in her area.  Patient verbalized understanding.  3. Immunizations  Patient is up-to-date on annual influenza, COVID-19, and Prevnar 13.  Patient is eligible for repeat Pneumovax 23 and is due for second Shingrix vaccine.  PLAN  Start Bevespi 2 puffs twice a day once approved through patient assistance  Utilize pillbox to help manage medications  All questions encouraged and answered.  Instructed patient to reach out with any further questions or concerns.  Thank you for allowing pharmacy to participate in this patient's care.  This appointment required 65 minutes of patient care (this includes precharting, chart review, review of results, face-to-face care, etc.).   Mariella Saa, PharmD, [place], CPP Clinical Specialty Pharmacist  (Rheumatology and Pulmonology)  08/06/2019 8:44 AM

## 2019-08-11 ENCOUNTER — Telehealth: Payer: Self-pay | Admitting: Neurology

## 2019-08-11 ENCOUNTER — Telehealth: Payer: Self-pay | Admitting: Family Medicine

## 2019-08-11 DIAGNOSIS — G4730 Sleep apnea, unspecified: Secondary | ICD-10-CM | POA: Diagnosis not present

## 2019-08-11 DIAGNOSIS — R413 Other amnesia: Secondary | ICD-10-CM

## 2019-08-11 DIAGNOSIS — R0683 Snoring: Secondary | ICD-10-CM | POA: Diagnosis not present

## 2019-08-11 NOTE — Telephone Encounter (Signed)
Pt called to schedule her 3-4 month f/u.  Pt then stated she wanted a female Dr.  Abbott Pao was told her request would be submitted and to allow time for the providers to decide on who she would be put under the care of.  Pt said she did not want to go through that and stated she will just go somewhere else.  This is FYI no call back requested.

## 2019-08-11 NOTE — Telephone Encounter (Signed)
Please discuss with Dr. Jaynee Eagles prior to scheduling any appointments for patient thanks

## 2019-08-11 NOTE — Telephone Encounter (Signed)
Patient's daughter called and states that she is trying to get a referral to Neuro for a Female Provider.  Pt is currently established at [facility] with Dr Jaynee Eagles, [name] states that the patient prefers a female provider and wants to be referred elsewhere.   Per telephone notes, [facility] offered to pass her request along to the female providers within [facility] but the daughter [name] declined.   Please advise, thanks.

## 2019-08-12 ENCOUNTER — Encounter: Payer: Self-pay | Admitting: Pulmonary Disease

## 2019-08-13 NOTE — Telephone Encounter (Signed)
I put in another referral.  Thanks.  

## 2019-08-13 NOTE — Telephone Encounter (Signed)
Noted  

## 2019-08-14 ENCOUNTER — Encounter: Payer: Self-pay | Admitting: Neurology

## 2019-08-20 ENCOUNTER — Telehealth: Payer: Self-pay | Admitting: Pulmonary Disease

## 2019-08-20 DIAGNOSIS — G4734 Idiopathic sleep related nonobstructive alveolar hypoventilation: Secondary | ICD-10-CM

## 2019-08-20 NOTE — Telephone Encounter (Signed)
08/20/2019  Patient's overnight oximetry results are listed below:  08/11/2019-overnight oximetry on room air-duration of sleep 6 hours and 57 minutes, time spent below 88% 5 hours and 25 minutes, average SPO2 87, SpO2 low 61  Patient does qualify for nighttime oxygen. I would recommend the patient start 2 L of O2 at night.  Would also recommend that we retest the patient with another overnight oximetry on 2 L of oxygen to ensure this is adequately treating her nocturnal hypoxemia.  Please place the orders.   Wyn Quaker, FNP

## 2019-08-20 NOTE — Telephone Encounter (Signed)
Spoke with daughter Ferman Hamming, listed on the DPR, provided ONO results.  Daughter verbalized understanding.  Patient called oxygen company and had oxygen company pick it up.  Daughter states she only uses her inhaler when she thinks she needs it.  Daughter feels the patient will not use the oxygen.  She recommended that I call and speak with her directly.  Spoke with patient and provided the ONO results and recommendations from Wyn Quaker NP.  Patient stated she just had the oxygen picked up, but would wear it knowing how low her levels drop and she needs it.  She was set up with [facility], will order it though this agency.

## 2019-08-27 DIAGNOSIS — G4730 Sleep apnea, unspecified: Secondary | ICD-10-CM | POA: Diagnosis not present

## 2019-08-27 DIAGNOSIS — R0683 Snoring: Secondary | ICD-10-CM | POA: Diagnosis not present

## 2019-08-28 ENCOUNTER — Telehealth: Payer: Self-pay | Admitting: Pulmonary Disease

## 2019-08-28 DIAGNOSIS — G4734 Idiopathic sleep related nonobstructive alveolar hypoventilation: Secondary | ICD-10-CM

## 2019-08-28 NOTE — Telephone Encounter (Signed)
Attempted to call pt but unable to reach. Left message for her to return call. 

## 2019-08-28 NOTE — Telephone Encounter (Signed)
08/28/2019  Please notify the patient that we received the overnight oximetry results where the patient is wearing 2 L of oxygen at night.  Those results are listed below:  08/27/2019-duration of sleep 7 hours and 57 minutes, time spent below 88% 6 hours and 19 minutes, baseline oxygen level 87%, SpO2 low 69%  Would recommend based off of these results that patient increase to 4 L of O2 at night with repeat overnight oximetry study on [place], FNP

## 2019-08-29 DIAGNOSIS — J9611 Chronic respiratory failure with hypoxia: Secondary | ICD-10-CM | POA: Diagnosis not present

## 2019-08-29 NOTE — Telephone Encounter (Signed)
Spoke with patient's daughter Megan Peterson. I relayed the results below to her. She verbalized understanding. She stated that the patient will often times only use the O2 when she "feels" she needs it and she will have a hard time trying to explain to her that she needs to wear it. I asked her if she had ever complained of headaches or feeling groggy in the mornings, she can explain to her that these are signs that her O2 is low and she needs to wear the O2 at night at all times. She verbalized understanding of this and will explain this to her today.   She is ok with [place] sending the order to [employer]. Will place order for ONO to be repeated on 4L.   Nothing further needed at time of call.

## 2019-09-13 ENCOUNTER — Other Ambulatory Visit: Payer: Self-pay | Admitting: Family Medicine
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
## 2019-09-18 ENCOUNTER — Encounter: Payer: Self-pay | Admitting: Neurology

## 2019-09-18 ENCOUNTER — Ambulatory Visit: Payer: PPO | Admitting: Neurology

## 2019-09-18 ENCOUNTER — Other Ambulatory Visit: Payer: Self-pay

## 2019-09-18 VITALS — BP 146/76 | HR 69 | Resp 18 | Ht 59.0 in | Wt 133.0 lb

## 2019-09-18 DIAGNOSIS — G309 Alzheimer's disease, unspecified: Secondary | ICD-10-CM | POA: Diagnosis not present

## 2019-09-18 DIAGNOSIS — F0280 Dementia in other diseases classified elsewhere without behavioral disturbance: Secondary | ICD-10-CM | POA: Diagnosis not present

## 2019-09-18 MED ORDER — DONEPEZIL HCL 5 MG PO TABS
5.0000 mg | ORAL_TABLET | Freq: Every day | ORAL | 0 refills | Status: DC
Start: 2019-09-18 — End: 2019-10-13

## 2019-09-18 NOTE — Patient Instructions (Signed)
1.  We will start donepezil (Aricept) 5mg  daily for four weeks.  If you are tolerating the medication, then after four weeks, we will increase the dose to 10mg  daily.  Side effects include nausea, vomiting, diarrhea, vivid dreams, and muscle cramps.  Please call the clinic if you experience any of these symptoms. 2.  I would like you to restrict driving to locally, no more than 10 mile radius except to driving to your daughter's house.  If you are interested, I can refer you to an occupational therapist for a formal driving safety evaluation. 3.  Follow up in 6 months.   Alzheimer's Disease Alzheimer's disease is a brain disease that affects memory, thinking, language, and behavior. People with Alzheimer's disease lose mental abilities, and the disease gets worse over time. Alzheimer's disease is a form of dementia. What are the causes? This condition develops when a protein called beta-amyloid forms deposits in the brain. It is not known what causes these deposits to form. Alzheimer's disease may also be caused by a gene mutation that is inherited from one parent or both parents. A gene mutation is a harmful change in a gene. Not everyone who inherits the genetic mutation will get the disease. What increases the risk? You are more likely to develop this condition if you:  Are older than age 69.  Have a family history of dementia.  Have had a brain injury.  Have heart or blood vessel disease.  Have had a stroke.  Have high blood pressure or high cholesterol.  Have diabetes. What are the signs or symptoms? Symptoms of this condition may happen in three stages, which often overlap. Early stage In this stage, you may continue to be independent. You may still be able to drive, work, and be social. Symptoms in this stage include:  Minor memory problems, such as forgetting a name or what you read.  Difficulty with: ? Paying attention. ? Communicating. ? Doing familiar tasks. ? Problem  solving or doing calculations. ? Following instructions. ? Learning new things.  Anxiety.  Social withdrawal.  Loss of motivation. Moderate stage In this stage, you will start to need care. Symptoms in this stage include:  Difficulty with expressing thoughts.  Memory loss that affects daily life. This can include forgetting: ? Your address or phone number. ? Recent events that have happened. ? Parts of your personal history, such as where you went to school.  Confusion about where you are or what time it is.  Difficulty in judging distance.  Changes in personality, mood, and behavior. You may be moody, irritable, angry, frustrated, fearful, anxious, or suspicious.  Poor reasoning and judgment.  Delusions or hallucinations.  Changes in sleep patterns.  Wandering and getting lost, even in familiar places. Severe stage In the final stage, you will need help with your personal care and daily activities. Symptoms in this stage include:  Worsening memory loss.  Personality changes.  Loss of awareness of your surroundings.  Changes in physical abilities, including the ability to walk, sit, and swallow.  Difficulty in communicating.  Inability to control your bladder and bowels.  Increasing confusion.  Increasing behavior changes. How is this diagnosed? This condition is diagnosed by a health care provider who specializes in diseases of the nervous system (neurologist). Other causes of dementia may also be ruled out. Your health care provider will talk with you and your family, friends, or caregivers about your history and symptoms. A thorough medical history will be taken, and you  will have a physical exam and tests. Tests may include:  Lab tests, such as blood or urine tests.  Imaging tests, such as a CT scan, a PET scan, or an MRI.  A lumbar puncture. This test involves removing and testing a small amount of the fluid that surrounds the brain and spinal  cord.  An electroencephalogram (EEG). In this test, small metal discs are used to measure electrical activity in the brain.  Memory tests, cognitive tests, and neuropsychological tests. These tests evaluate brain function.  Genetic testing may be done if you have early onset of the disease (before age 9) or if other family members have the disease. How is this treated? At this time, there is no treatment to cure Alzheimer's disease or stop it from getting worse. The goals of treatment are:  To slow down symptoms of the disease, if possible.  To manage behavioral changes.  To provide you with a safe environment.  To help manage daily life for you and your caregivers. The following treatment options are available:  Medicines. Medicines may help to slow down memory loss and manage behavioral symptoms.  Cognitive therapy. Cognitive therapy provides you with education, support, and memory aids. It is most helpful in the early stages of the condition.  Counseling or spiritual guidance. It is normal to have a lot of feelings, including anger, relief, fear, and isolation. Counseling and guidance can help you deal with these feelings.  Caregiving. This involves having caregivers help you with your daily activities.  Family support groups. These provide education, emotional support, and information about community resources to family members who are taking care of you. Follow these instructions at home:  Medicines  Take over-the-counter and prescription medicines only as told by your health care provider.  Use a pill organizer or pill reminder to help you manage your medicines.  Avoid taking medicines that can affect thinking, such as pain medicines or sleeping medicines. Lifestyle  Make healthy lifestyle choices: ? Be physically active as told by your health care provider. Regular exercise may help improve symptoms. ? Do not use any products that contain nicotine or tobacco, such as  cigarettes, e-cigarettes, and chewing tobacco. If you need help quitting, ask your health care provider. ? Do not drink alcohol. ? Eat a healthy diet. ? Practice stress-management techniques when you get stressed. ? Stay social.  Drink enough fluid to keep your urine pale yellow.  Make sure to get quality sleep. ? Avoid taking long naps during the day. Take short naps of 30 minutes or less if needed. ? Keep your sleeping area dark and cool. ? Avoid exercising during the few hours before you go to bed. ? Avoid caffeine products in the afternoon and evening. General instructions  Work with your health care provider to determine what you need help with and what your safety needs are.  If you were given a bracelet that identifies you as a person with memory loss or tracks your location, make sure to wear it at all times.  Talk with your health care provider about whether it is safe for you to drive.  Work with your family to make important decisions, such as advance directives, medical power of attorney, or a living will.  Keep all follow-up visits as told by your health care provider. This is important. Where to find more information  The Alzheimer's Association: Call the 24-hour helpline at 1-540 230 8616, or visit [employer] Contact a health care provider if:  You have nausea,  vomiting, or trouble with eating.  You have dizziness or weakness.  You or your family members become concerned for your safety. Get help right away if:  You feel depressed or sad, or feel that you want to harm yourself.  You develop chest pain or difficulty with breathing.  You pass out. If you ever feel like you may hurt yourself or others, or have thoughts about taking your own life, get help right away. You can go to your nearest emergency department or call:  Your local emergency services (911 in the [place]  A suicide crisis helpline, such as the [place] at  (740)689-3395. This is open 24 hours a day. Summary  Alzheimer's disease is a brain disease that affects memory, thinking, language, and behavior. Alzheimer's disease is a form of dementia.  This condition is diagnosed by a specialist in diseases of the nervous system (neurologist).  At this time, there is no treatment to cure Alzheimer's disease or stop it from getting worse. The goals of treatment are to slow memory loss and help you manage any symptoms.  Work with your family to make important decisions, such as advance directives, medical power of attorney, or a living will. This information is not intended to replace advice given to you by your health care provider. Make sure you discuss any questions you have with your health care provider. Document Revised: 11/27/2017 Document Reviewed: 11/27/2017 Elsevier Patient Education  2020 [employer].

## 2019-09-23 ENCOUNTER — Telehealth: Payer: Self-pay | Admitting: Pulmonary Disease

## 2019-09-23 NOTE — Telephone Encounter (Signed)
Spoke with [name] regarding patient refusing to come to her o/v on 9/23 at 11:15am with Dr.Alva. [name] wants to know if we can change her in office visit to televisit .Ulla Potash I need to send a message to [facility] to get the switch approved.  Dr.Alva can you please advise .  Thank you

## 2019-09-25 ENCOUNTER — Other Ambulatory Visit: Payer: Self-pay | Admitting: Family Medicine

## 2019-09-25 ENCOUNTER — Ambulatory Visit: Payer: PPO | Admitting: Pulmonary Disease

## 2019-09-25 NOTE — Telephone Encounter (Signed)
Spoke with the pt's daughter and scheduled televisit with Dr Elsworth Soho for tomorrow at 11:30 am  Nothing further needed

## 2019-09-25 NOTE — Telephone Encounter (Signed)
Electronic refill request. Gabapentin Last office visit:   04/22/2019 Last Filled:    270 tablet 0 05/19/2019

## 2019-09-25 NOTE — Telephone Encounter (Signed)
Okay to schedule televisit with BM/me - next available

## 2019-09-26 ENCOUNTER — Telehealth: Payer: Self-pay | Admitting: Family Medicine

## 2019-09-26 ENCOUNTER — Other Ambulatory Visit: Payer: Self-pay

## 2019-09-26 ENCOUNTER — Ambulatory Visit (INDEPENDENT_AMBULATORY_CARE_PROVIDER_SITE_OTHER): Payer: PPO | Admitting: Pulmonary Disease

## 2019-09-26 ENCOUNTER — Telehealth: Payer: Self-pay

## 2019-09-26 DIAGNOSIS — J9611 Chronic respiratory failure with hypoxia: Secondary | ICD-10-CM

## 2019-09-26 DIAGNOSIS — J432 Centrilobular emphysema: Secondary | ICD-10-CM

## 2019-09-26 MED ORDER — TRELEGY ELLIPTA 100-62.5-25 MCG/INH IN AEPB: 1.0000 | INHALATION_SPRAY | Freq: Every day | RESPIRATORY_TRACT | 3 refills | Status: DC

## 2019-09-26 MED ORDER — GABAPENTIN 800 MG PO TABS
800.0000 mg | ORAL_TABLET | Freq: Three times a day (TID) | ORAL | 0 refills | Status: DC
Start: 2019-09-26 — End: 2019-10-22

## 2019-09-26 NOTE — Telephone Encounter (Signed)
Please contact patient/daughter.  If her memory is worsening then I think it makes sense to either follow-up here or with neurology in the relatively near future.  Thanks.

## 2019-09-26 NOTE — Telephone Encounter (Signed)
Previously addressed. See other note.

## 2019-09-26 NOTE — Assessment & Plan Note (Addendum)
I tried to convince her over the phone to use her nocturnal oxygen.  I asked her daughter to keep a record of her oxygen saturation using a pulse oximeter.  She could not find the oximeter today but agreed to do so. Her dementia may complicate her care.  She lives by herself, daughter lives close by.  She may need more support at home as her dementia worsens -defer these issues to PCP  We will try for an in person visit next time

## 2019-09-26 NOTE — Telephone Encounter (Signed)
[facility] - Client TELEPHONE ADVICE RECORD [facility] Patient Name: Megan Peterson Gender: Female DOB: 1940-05-21 Age: 79 Y 7 M 10 D Return Phone Number: [phone] (Primary), [phone] (Secondary) [place] [address] Client [facility] - Client Client Site [place] Physician Renford Dills - MD Contact Type Call Who Is Calling Patient / Member / Family / Caregiver Call Type Triage / Clinical Caller Name [name] Relationship To Patient Daughter Return Phone Number [phone] (Secondary) Chief Complaint Leg Pain Reason for Call Symptomatic / Request for Health Information Initial Comment Caller's mother isn't sleeping much. She has pain from groin through leg. Translation No Nurse Assessment Nurse: May, RN, Tammy Date/Time Eilene Ghazi Time): 09/26/2019 8:40:26 AM Confirm and document reason for call. If symptomatic, describe symptoms. ---Caller states her mother has bilateral leg pain from the groin to the feet. Does the patient have any new or worsening symptoms? ---Yes Will a triage be completed? ---Yes Related visit to physician within the last 2 weeks? ---No Does the PT have any chronic conditions? (i.e. diabetes, asthma, this includes High risk factors for pregnancy, etc.) ---Yes List chronic conditions. ---htn, mini stroke Is this a behavioral health or substance abuse call? ---No Guidelines Guideline Title Affirmed Question Affirmed Notes Nurse Date/Time (Eastern Time) Leg Pain [1] SEVERE pain (e.g., excruciating, unable to do any normal activities) AND [2] not improved after 2 hours of pain medicine May, RN, Tammy 09/26/2019 8:44:45 AM Disp. Time Eilene Ghazi Time) Disposition Final User 09/26/2019 8:47:00 AM See HCP within 4 Hours (or PCP triage) Yes May, RN, Tammy PLEASE NOTE: All timestamps contained within this report are represented as [place]  Standard Time. CONFIDENTIALTY NOTICE: This fax transmission is intended only for the addressee. It contains information that is legally privileged, confidential or otherwise protected from use or disclosure. If you are not the intended recipient, you are strictly prohibited from reviewing, disclosing, copying using or disseminating any of this information or taking any action in reliance on or regarding this information. If you have received this fax in error, please notify us immediately by telephone so that we can arrange for its return to [place]. Phone: [phone], Toll-Free: [phone], Fax: [phone] Page: 2 of 2 Call Id: [id number] [place] Disagree/Comply Comply Caller Understands Yes PreDisposition Did not know what to do Care Advice Given Per Guideline SEE HCP (OR PCP TRIAGE) WITHIN 4 HOURS: * IF OFFICE WILL BE OPEN: You need to be seen within the next 3 or 4 hours. Call your doctor (or NP/PA) now or as soon as the office opens. CALL BACK IF: * You become worse CARE ADVICE given per Leg Pain (Adult) guideline. Comments User: Merrilee Jansky, RN Date/Time Eilene Ghazi Time): 09/26/2019 8:54:05 AM Call made to the backline, no appt available for today. Advised caller to go to UC. Referrals Warm transfer to backline

## 2019-09-26 NOTE — Telephone Encounter (Signed)
This may be the reason she is having more leg pain, if she is off gabapentin.  I sent another prescription.  If this does not help or she is worsening in the meantime then she needs to get rechecked.  Thanks.   Please see other phone notes regarding her memory-family may need to help manage her medications at home.  Please have family review her med list/medications at home.  Thanks.

## 2019-09-26 NOTE — Progress Notes (Signed)
   Subjective:    Patient ID: Megan Peterson, female    DOB: February 11, 1940, 79 y.o.   MRN: [id number]  HPI   I connected with  Megan Peterson on 09/26/19 by phone/  video enabled telemedicine application and verified that I am speaking with the correct person using two identifiers.     Location: Patient: Home Provider: Office - [facility] - [address], [address], [place], [place] [address]   I discussed the limitations of evaluation and management by telemedicine and the availability of in person appointments. The patient expressed understanding and agreed to proceed. I also discussed with the patient that there may be a patient responsible charge related to this service. The patient expressed understanding and agreed to proceed.   Patient consented to consult via telephone: Yes People present and their role in pt care: daughter [name]   42 yo ex-smoker FU  for COPD, bronchiectasis and chronic respiratory failure on oxygen  PMH - chronic pain   Last OV 07/2019 reviewed  08/11/2019-overnight oximetry on room air-duration of sleep 6 hours and 57 minutes, time spent below 88% 5 hours and 25 minutes, average SPO2 87, SpO2 low 61 >>start 2L   ONO on 2L 08/27/2019-duration of sleep 7 hours and 57 minutes, time spent below 88% 6 hours and 19 minutes, baseline oxygen level 87%, SpO2 low 69% >> increase to 4L during sleep  She was started on nocturnal oxygen.  History obtained from patient's daughter [name] today Diagnosed with alzheimer's by neurology Still drives Does not want in person visit - 'im tired of doctors' Does not want to use oxygen.  She was given another sample which is finished and she is mostly using albuterol on an as-needed basis.  Anoro was expensive Unable to find pulse oximeter today, her breathing appears okay but eyes appear somewhat sunken.  I was able to have a conversation with the patient and she appeared very pleasant on the phone  Significant tests/ events  reviewed  PFT  06/2016 that showed similar lung function(compared 2013)with moderate to severe airflow obstruction with an FEV1 at 55%, ratio 52, positive bronchodilator response, FVC 80%, DLCO 56%.  CT angiogram 04/21/16 was negative for pulmonary embolism but showed new bilateral upper lobe groundglass infiltrates with mild bronchiectasis in both lower lobes  Review of Systems  Per daughter   - Does not appear to be sob  -no wheezing, cough    Objective:   Physical Exam  Pleasant and cooperative, able to speak in full sentences on the phone does not appear to be short of breath -phone visit      Assessment & Plan:    Total encounter time x 23 mins

## 2019-09-26 NOTE — Assessment & Plan Note (Signed)
She did not appear to be short of breath on the phone today and does not appear to be having an exacerbation from a COPD standpoint. We will provide her with a prescription for Trelegy, seems like this is covered on her insurance. If for some reason not covered, we can trial Breztri She is willing to use the maintenance inhaler daily

## 2019-09-26 NOTE — Telephone Encounter (Signed)
See other phone notes.

## 2019-09-26 NOTE — Patient Instructions (Signed)
Rx for trelegy  Follow up in month

## 2019-09-26 NOTE — Telephone Encounter (Signed)
[name] (daughter) called stating they cannot find pt's gabapentin and wanted to know what they need to do to get a refill.  They cannot find the bottle anywhere   [place]  Please advise

## 2019-09-26 NOTE — Telephone Encounter (Signed)
Tammy RN with access nurse calling pt has bilateral groin and leg pain. No available appts at [facility] wants to know if pt should go to UC; disposition is for pt to be seen within 4 hrs. Advised yes. Nothing further needed at this time.

## 2019-09-29 DIAGNOSIS — J9611 Chronic respiratory failure with hypoxia: Secondary | ICD-10-CM | POA: Diagnosis not present

## 2019-09-29 NOTE — Telephone Encounter (Signed)
Daughter advised, appt scheduled.

## 2019-09-29 NOTE — Telephone Encounter (Signed)
Patient has seen Neurology last week.  Daughter made appt to see Dr. Damita Dunnings on Thursday, Sept 30.

## 2019-10-02 ENCOUNTER — Other Ambulatory Visit: Payer: Self-pay

## 2019-10-02 ENCOUNTER — Encounter: Payer: Self-pay | Admitting: Family Medicine

## 2019-10-02 ENCOUNTER — Other Ambulatory Visit: Payer: Self-pay | Admitting: Family Medicine

## 2019-10-02 ENCOUNTER — Ambulatory Visit (INDEPENDENT_AMBULATORY_CARE_PROVIDER_SITE_OTHER): Payer: PPO | Admitting: Family Medicine

## 2019-10-02 VITALS — BP 126/72 | HR 88 | Temp 97.1°F | Ht 59.0 in | Wt 134.4 lb

## 2019-10-02 DIAGNOSIS — R413 Other amnesia: Secondary | ICD-10-CM

## 2019-10-02 DIAGNOSIS — L989 Disorder of the skin and subcutaneous tissue, unspecified: Secondary | ICD-10-CM

## 2019-10-02 DIAGNOSIS — Z23 Encounter for immunization: Secondary | ICD-10-CM

## 2019-10-02 MED ORDER — TRELEGY ELLIPTA 100-62.5-25 MCG/INH IN AEPB: 1.0000 | INHALATION_SPRAY | Freq: Every day | RESPIRATORY_TRACT | 3 refills | Status: DC

## 2019-10-02 NOTE — Patient Instructions (Addendum)
Start the donepezil.   Use the pill organizer.  Check on a fall button.  Update me as needed.  I'll await the neurology notes.  Take care.  Glad to see you.
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
## 2019-10-06 NOTE — Assessment & Plan Note (Addendum)
See note above regarding treatment with liquid nitrogen with routine cautions given to patient.

## 2019-10-06 NOTE — Assessment & Plan Note (Signed)
At this point still okay for outpatient follow-up.  Patient consents for extra family help with medication organization.  Fall button given.  Encourage patient to talk with family about upcoming care needs such as driving, housework, cooking, etc.  She can still be relatively independent at this point.  Would continue donepezil.  See above regarding medications.

## 2019-10-13 ENCOUNTER — Other Ambulatory Visit: Payer: Self-pay | Admitting: Family Medicine

## 2019-10-13 ENCOUNTER — Other Ambulatory Visit: Payer: Self-pay | Admitting: Neurology

## 2019-10-13 NOTE — Telephone Encounter (Signed)
Last office visit  10/02/2019 for memory loss.  Last refilled 07/15/2019 for #120 with no refills.  UDS/Contract 11/12/2018.  No future appointments with PCP.

## 2019-10-14 NOTE — Telephone Encounter (Signed)
Duly noted.  rx sent.  Thanks.

## 2019-10-14 NOTE — Telephone Encounter (Signed)
[facility] called about refill.  She said she knows they're asking for an early refill, but patient is starting the "bubble packing system" and they're trying to get all of her medications done for 4 weeks.

## 2019-10-20 ENCOUNTER — Other Ambulatory Visit: Payer: Self-pay | Admitting: Family Medicine

## 2019-10-20 ENCOUNTER — Telehealth: Payer: Self-pay | Admitting: Family Medicine

## 2019-10-20 ENCOUNTER — Telehealth: Payer: Self-pay | Admitting: Pulmonary Disease

## 2019-10-20 NOTE — Telephone Encounter (Signed)
I called and left v/m asking pts daughter to cb to [phone] and need to have pt's condition triaged. I tried calling pt; pt said she is feeling fine today; pt only uses oxygen when needed and pt has not had to use oxygen today. Pt is not having any difficulty breathing today. Pt will cb if needed and UC & ED precautions given and pt voiced understanding. FYI to Dr Damita Dunnings.

## 2019-10-20 NOTE — Telephone Encounter (Signed)
Noted. Thanks.

## 2019-10-20 NOTE — Telephone Encounter (Signed)
Please triage patient.

## 2019-10-20 NOTE — Telephone Encounter (Signed)
Pt's daughter called to let you know that ambulance had to be called for pt last night b/c she was having trouble breathing.  She forgot to use her rescue inhaler.  EMS turned up her oxygen.  EMS told her to notify you to make you aware.  She is breathing better today, but her nose to stuffy.

## 2019-10-20 NOTE — Telephone Encounter (Signed)
10/20/19  Difficult to fully say based off the information provided.  Looks like the last visit with Dr. Elsworth Soho was a telephonic visit where she was encouraged to wear her oxygen.  Patient was struggling with compliance due to worsening dementia.  Based off the report from [facility] documentation if patient struggling with adherence of Trelegy Ellipta this also will affect her shortness of breath.  Patient needs to adhere to the medications and the oxygen which we have recommended.  Please schedule patient with Dr. Elsworth Soho.  It was recommended that she have 1 month follow-up at the end of last telephonic visit.  If symptoms worsen they will need to seek emergent care such as an urgent care, emergency room and contact our office.  Wyn Quaker, FNP

## 2019-10-20 NOTE — Telephone Encounter (Signed)
Spoke with the pt's daughter and notified of response per Aaron Edelman  Pt scheduled visit with Dr. Elsworth Soho for 11/04/19  Nothing further needed

## 2019-10-20 NOTE — Telephone Encounter (Signed)
Seen by Warner Mccreedy NP recently will send to him for eval

## 2019-10-20 NOTE — Telephone Encounter (Signed)
Spoke with the pt's daughter  She states pt having increased SOB x 2 days  She also has chills, sweats and nasal congestion- no fever  She denies any increased in cough, sputum production, wheezing  She states she takes her trelegy "when she remembers"  EMS was called 10/19/19 but pt not taken to hospital  She was told to call her MD office today for recs  Pt has had both covid vaccines  Please advise, thanks!

## 2019-10-21 ENCOUNTER — Other Ambulatory Visit: Payer: Self-pay | Admitting: Family Medicine

## 2019-10-22 NOTE — Telephone Encounter (Signed)
Refill request Gabapentin Last refill 09/26/19 #270 Last office visit 10/02/19

## 2019-10-22 NOTE — Telephone Encounter (Signed)
Sent. Thanks.   

## 2019-10-24 ENCOUNTER — Telehealth: Payer: Self-pay | Admitting: *Deleted

## 2019-10-24 NOTE — Telephone Encounter (Signed)
I get the point of getting pulmonary input.  If acutely SOB w/o relief from inhaler, then ER dispo stands.  Thanks.

## 2019-10-24 NOTE — Telephone Encounter (Signed)
Daughter advised.

## 2019-10-24 NOTE — Telephone Encounter (Signed)
Pt's daughter [name] called Triage due to pt having some SOB and having to use her rescue inhaler twice this morning. I see pt has a pulmonary doctor and I asked have they called them. Daughter said no last time they just told her to "take pt to ER",  Daughter told me she's been checking pt's pulse Ox and pulse has been between 66-71 and O2 is 97%, daughter advise pt's vitals are stable but she needs to call pulmonary and let them know what's going on since they are her specialist and if there is any problems or she doesn't hear back from them to call us back. Daughter verbalized understanding and said she would call them.  FYI to PCP

## 2019-10-27 ENCOUNTER — Telehealth: Payer: Self-pay | Admitting: Pulmonary Disease

## 2019-10-27 NOTE — Telephone Encounter (Signed)
Spoke with [name], pt's daughter She states pt had episode of SOB while at church on 10/26/19  She used her rescue inhaler and did not get relief immediately  She has been checking her sats after exertion and lowest reading was 87%ra  She wants to know if we can order her a POC to use during the day to give her some relief  Please advise, thanks!

## 2019-10-27 NOTE — Telephone Encounter (Signed)
Called and spoke with [name] to let her know of Dr. Angus Palms recs that patient would need to come into office for walk. Patient has O2 at home that she wears already. Patient is scheduled for OV with him next week already. [name] advised they would keep that appointment and if they felt like they needed it sooner they would call back. Expressed understanding. Nothing further needed at this time.

## 2019-10-27 NOTE — Telephone Encounter (Signed)
She may need an in person visit with APP for oxygen check to qualify for POC. She has been difficult to bring in for an in person visit due to dementia. Alternatively, we can try and see if DME can assess for POC needs

## 2019-10-29 DIAGNOSIS — J9611 Chronic respiratory failure with hypoxia: Secondary | ICD-10-CM | POA: Diagnosis not present

## 2019-11-04 ENCOUNTER — Ambulatory Visit: Payer: PPO | Admitting: Pulmonary Disease

## 2019-11-04 ENCOUNTER — Ambulatory Visit: Payer: PPO | Admitting: Neurology

## 2019-11-04 ENCOUNTER — Other Ambulatory Visit: Payer: Self-pay

## 2019-11-04 ENCOUNTER — Encounter: Payer: Self-pay | Admitting: Pulmonary Disease

## 2019-11-04 VITALS — BP 110/70 | HR 70 | Temp 98.0°F | Ht 59.0 in | Wt 132.6 lb

## 2019-11-04 DIAGNOSIS — J9611 Chronic respiratory failure with hypoxia: Secondary | ICD-10-CM

## 2019-11-04 DIAGNOSIS — J432 Centrilobular emphysema: Secondary | ICD-10-CM

## 2019-11-04 NOTE — Assessment & Plan Note (Signed)
We will try to keep her regimen very simple with Anoro once daily and albuterol only as needed  We discussed signs and symptoms of COPD exacerbation she will call us as needed.  Goal will be to prevent hospitalization

## 2019-11-04 NOTE — Patient Instructions (Signed)
Take Anoro once daily Keep this next year toothbrush so you remember to take it daily. Use albuterol only as needed.  We will get you portable oxygen

## 2019-11-04 NOTE — Progress Notes (Signed)
   Subjective:    Patient ID: Megan Peterson, female    DOB: [birth year], 79 y.o.   MRN: [id number]  HPI  79 yo ex-smokerFUfor COPD, bronchiectasis and chronic respiratory failure on oxygen  PMH - chronic pain, alzheimer's  Chief Complaint  Patient presents with  . Follow-up    Pt has been doing okay since last visit. States she is still having problems with SOB which can even when she is at rest. Denies any complaints of cough, wheezing, or chest discomfort.   Accompanied by daughter [name]. Hauler Alzheimer's is getting worse, she lives by herself and cleans well, daughter lives in [place].  She is still allowed to drive short distances.  She has started going to church and stop breathing.  Watches TV all day. Activity has decreased.  Complains of difficulty breathing on routine activities such as taking a shower.  Oxygen saturation dropped to 87% today and recovered with oxygen.  10/18 sick call , called EMS but did not have to go to the hospital Forgets to take anoro occasionally, Trelegy was too expensive. I have "too many medications", does not like pillbox given by pharmacy   Significant tests/ events reviewed  08/11/2019-overnight oximetry on room air-duration of sleep 6 hours and 57 minutes, time spent below 88% 5 hours and 25 minutes, average SPO2 87, SpO2 low 61 >>start 2L   ONO on 2L 08/27/2019-duration of sleep 7 hours and 57 minutes, time spent below 88% 6 hours and 19 minutes, baseline oxygen level 87%, SpO2 low 69% >> increase to 4L during sleep  PFT06/2018 that showed similar lung function(compared 2013)with moderate to severe airflow obstruction with an FEV1 at 55%, ratio 52, positive bronchodilator response, FVC 80%, DLCO 56%.  CT angiogram 04/21/16 was negative for pulmonary embolism but showed new bilateral upper lobe groundglass infiltrates with mild bronchiectasis in both lower lobes  Review of Systems Patient denies significant dyspnea,cough,  hemoptysis,  chest pain, palpitations, pedal edema, orthopnea, paroxysmal nocturnal dyspnea, lightheadedness, nausea, vomiting, abdominal or  leg pains      Objective:   Physical Exam   Gen. Pleasant, elderly,well-nourished, in no distress ENT - no thrush, no pallor/icterus,no post nasal drip Neck: No JVD, no thyromegaly, no carotid bruits Lungs: no use of accessory muscles, no dullness to percussion, decreased without rales or rhonchi  Cardiovascular: Rhythm regular, heart sounds  normal, no murmurs or gallops, no peripheral edema Musculoskeletal: No deformities, no cyanosis or clubbing         Assessment & Plan:

## 2019-11-04 NOTE — Assessment & Plan Note (Signed)
She does qualify for oxygen.  We will get her portable oxygen so that she can take it with her to church. She will continue to use nocturnal oxygen 3.5 liters during sleep

## 2019-11-10 ENCOUNTER — Other Ambulatory Visit: Payer: Self-pay | Admitting: Family Medicine

## 2019-11-11 ENCOUNTER — Telehealth: Payer: Self-pay | Admitting: Pulmonary Disease

## 2019-11-11 DIAGNOSIS — J9611 Chronic respiratory failure with hypoxia: Secondary | ICD-10-CM

## 2019-11-11 NOTE — Telephone Encounter (Signed)
Pharmacy requests refill on: Hydrocodone-Acetaminophen 5-325 mg   LAST REFILL: 10/13/2019 LAST OV: 10/02/2019 NEXT OV: Not Scheduled  PHARMACY: [facility]

## 2019-11-11 NOTE — Telephone Encounter (Signed)
Spoke with daughter (dpr on file) states that she was contacted by [employer] to complete another qualifying walk for a POC.  Pt was seen and tested on 11/2, but the order placed was for another POC evaluation and not for the POC that the pt already qualified for.    Corrected order placed for POC.  Nothing further needed at this time- will close encounter.

## 2019-11-12 NOTE — Telephone Encounter (Signed)
Sent. Thanks.   

## 2019-11-13 ENCOUNTER — Encounter: Payer: Self-pay | Admitting: Family Medicine

## 2019-11-14 ENCOUNTER — Telehealth: Payer: Self-pay | Admitting: Pulmonary Disease

## 2019-11-14 NOTE — Telephone Encounter (Signed)
Spoke with pt's daughter  She states she spoke with [employer] this afternoon and they told her that we never sent order for POC  Looking at the referral, it looks like they did receive it  She is already aware pt needs assessment with them  Forwarding to [facility] to check on this  Please advise, thanks!

## 2019-11-14 NOTE — Telephone Encounter (Signed)
[facility] states they have not received anything and pt is needing to do an assessment first . Please reach out to pt daughter. CB# [phone]

## 2019-11-17 NOTE — Telephone Encounter (Signed)
Order was placed on 11/9 for best fit poc or portable O2 system.  Leah at [employer] responded to me on 11/10 they had been unable to reach pt to do eval.  I responded back to her and gave her dtr's phone #.  I have called Melissa at [facility] left her a vm asking her to check on this as pt's dtr is stating [employer] is saying they never got an order.

## 2019-11-17 NOTE — Telephone Encounter (Signed)
Spoke to [place].  Confusion is that order placed on 11/2 is asking for Best Fit Eval - not for poc.  Order placed on 11/9 states poc 2 liters continuous and pt was qualified on pulse dose.  Melissa has called pt's dtr and she is going to bring pt in for best fit eval & they will provide poc if available and if not will be able to show her what they have.  Nothing further needed.

## 2019-11-25 ENCOUNTER — Telehealth: Payer: Self-pay | Admitting: Family Medicine

## 2019-11-25 NOTE — Telephone Encounter (Signed)
New message    Daughter voiced mom had incident at [place] today C/o  back pain getting worse had to hold onto the shopping cart.   Please advise / patient at home now.

## 2019-11-25 NOTE — Telephone Encounter (Signed)
Spoke with pt's daughter Ferman Hamming and she stated that her mom back is hurting really back and that when she went shopping she had to use a shopping cart to help her back to the car. Wanted to know if you had any new suggestions to help with this.

## 2019-11-25 NOTE — Telephone Encounter (Signed)
Message sent thru MyChart 

## 2019-11-25 NOTE — Telephone Encounter (Signed)
If not any better with rest/ice/heat/baseline meds (gabapentin and hydrocodone), then I think she is going to need to get checked.  I hope she feels better soon.

## 2019-11-29 DIAGNOSIS — J9611 Chronic respiratory failure with hypoxia: Secondary | ICD-10-CM | POA: Diagnosis not present

## 2019-12-16 ENCOUNTER — Encounter: Payer: Self-pay | Admitting: Family Medicine

## 2019-12-16 ENCOUNTER — Other Ambulatory Visit: Payer: Self-pay

## 2019-12-16 ENCOUNTER — Ambulatory Visit (INDEPENDENT_AMBULATORY_CARE_PROVIDER_SITE_OTHER): Payer: PPO | Admitting: Family Medicine

## 2019-12-16 VITALS — BP 136/70 | HR 76 | Temp 96.9°F | Ht <= 58 in | Wt 135.4 lb

## 2019-12-16 DIAGNOSIS — R413 Other amnesia: Secondary | ICD-10-CM | POA: Diagnosis not present

## 2019-12-16 DIAGNOSIS — J9611 Chronic respiratory failure with hypoxia: Secondary | ICD-10-CM

## 2019-12-16 LAB — CBC WITH DIFFERENTIAL/PLATELET
Basophils Absolute: 0.1 10*3/uL (ref 0.0–0.1)
Basophils Relative: 1.1 % (ref 0.0–3.0)
Eosinophils Absolute: 0.2 10*3/uL (ref 0.0–0.7)
Eosinophils Relative: 2.6 % (ref 0.0–5.0)
HCT: 38 % (ref 36.0–46.0)
Hemoglobin: 12.7 g/dL (ref 12.0–15.0)
Lymphocytes Relative: 31.6 % (ref 12.0–46.0)
Lymphs Abs: 2.7 10*3/uL (ref 0.7–4.0)
MCHC: 33.4 g/dL (ref 30.0–36.0)
MCV: 92 fl (ref 78.0–100.0)
Monocytes Absolute: 1.4 10*3/uL — ABNORMAL HIGH (ref 0.1–1.0)
Monocytes Relative: 16.2 % — ABNORMAL HIGH (ref 3.0–12.0)
Neutro Abs: 4.1 10*3/uL (ref 1.4–7.7)
Neutrophils Relative %: 48.5 % (ref 43.0–77.0)
Platelets: 241 10*3/uL (ref 150.0–400.0)
RBC: 4.13 Mil/uL (ref 3.87–5.11)
RDW: 13.1 % (ref 11.5–15.5)
WBC: 8.5 10*3/uL (ref 4.0–10.5)

## 2019-12-16 LAB — TSH: TSH: 3.06 u[IU]/mL (ref 0.35–4.50)

## 2019-12-16 LAB — COMPREHENSIVE METABOLIC PANEL
ALT: 12 U/L (ref 0–35)
AST: 23 U/L (ref 0–37)
Albumin: 4.1 g/dL (ref 3.5–5.2)
Alkaline Phosphatase: 98 U/L (ref 39–117)
BUN: 20 mg/dL (ref 6–23)
CO2: 29 mEq/L (ref 19–32)
Calcium: 9.1 mg/dL (ref 8.4–10.5)
Chloride: 102 mEq/L (ref 96–112)
Creatinine, Ser: 1.21 mg/dL — ABNORMAL HIGH (ref 0.40–1.20)
GFR: 42.93 mL/min — ABNORMAL LOW (ref >60.00)
Glucose, Bld: 84 mg/dL (ref 70–99)
Potassium: 5.8 mEq/L — ABNORMAL HIGH (ref 3.5–5.1)
Sodium: 138 mEq/L (ref 135–145)
Total Bilirubin: 0.4 mg/dL (ref 0.2–1.2)
Total Protein: 6.7 g/dL (ref 6.0–8.3)

## 2019-12-16 NOTE — Progress Notes (Addendum)
This visit occurred during the SARS-CoV-2 public health emergency.  Safety protocols were in place, including screening questions prior to the visit, additional usage of staff PPE, and extensive cleaning of exam room while observing appropriate contact time as indicated for disinfecting solutions.  "My memory is worse, I can't remember."  She thought it was gradually getting worse.  She had not taken donepezil consistently.  Family is checking on patient but patient still needs help getting her meds.  She isn't drowsy.  She didn't like using a pill tray.    Chronic respiratory failure with history of hypoxia.  She had pulmonary f/u in the meantime.  Patient had been forgetting anoro some of the time.  We talked about daily vs prn inhaler.  She isn't using O2 at night, only as needed.  D/w pt about use, she rarely uses.  Off O2 at the [place] today.    Walking d/w pt.  She needs extra help, needs to rest (meaning she can't walk distance due to SOB with activity).  She'll use a wheelchair in stores.  We talked about getting a wheelchair.  Prescription given to family.  She needs a light weight wheelchair due to dyspnea on exertion. Her mobility is limited by her pulmonary status.  This prevents her from completing all ADLs. She is unable to safely walk distance with a rolling walker cane or crutch because it would still not help with her dyspnea. She does have family who can help her propel the wheelchair.  She could not pick up/operate a wheelchair that weighs greater than 36 pounds.  Advised to stop driving.  I told her what are normal to tell patients.  I cannot make somebody stop driving.  I advised the patient and family here today that she should not drive.  Meds, vitals, and allergies reviewed.   ROS: Per HPI unless specifically indicated in ROS section   nad ncat Neck supple, no LA rrr ctab Abdomen soft, not tender Skin well perfused Extremities without edema.  Knows the year and month  and day of the week.  3/3 attention.   EARTH--HTRAE 2/3 recall today.

## 2019-12-16 NOTE — Patient Instructions (Signed)
Check with DME about a wheelchair.   Go to the lab on the way out.   If you have mychart we'll likely use that to update you.    Please consider getting a fall button and see about getting more help at home.   Take care.  Glad to see you.

## 2019-12-17 ENCOUNTER — Other Ambulatory Visit: Payer: Self-pay | Admitting: Family Medicine

## 2019-12-17 DIAGNOSIS — E875 Hyperkalemia: Secondary | ICD-10-CM

## 2019-12-17 NOTE — Assessment & Plan Note (Addendum)
I advised her not to drive.  Family witnessed this.  I asked her what her goals are and she wants to stay at home.  Given that she admits to having memory loss and has objective findings to support that, it makes sense to do everything that can be done to try to make her situation safer at home.  We talked about getting extra help at home, using a pill container, using a fall button, and other routine safety measures.  I cannot force her to comply with this and she is an independent lady who is used to managing her own affairs.  I asked her to consider all of this and think about getting extra help at home.  This was a pleasant conversation.  Reasonable to check routine labs, see orders, regarding memory loss.  At least 30 minutes were devoted to patient care in this encounter (this can include time spent reviewing the patient's file/history, interviewing and examining the patient, counseling/reviewing plan with patient, ordering referrals, ordering tests, reviewing relevant laboratory or x-ray data, and documenting the encounter).

## 2019-12-17 NOTE — Assessment & Plan Note (Signed)
She has been forgetting to use her inhalers and she is using oxygen.  She is more short of breath with exertion.  We talked about using her inhalers appropriately, getting a wheelchair, and using oxygen.  I cannot make her do any of that and I do not think she is trying to be willfully noncompliant but her memory is getting worse (by the patient's own admission) and that likely affects her situation.  See memory discussion.  At this point still okay for outpatient follow-up.

## 2019-12-29 DIAGNOSIS — J9611 Chronic respiratory failure with hypoxia: Secondary | ICD-10-CM | POA: Diagnosis not present

## 2019-12-31 ENCOUNTER — Other Ambulatory Visit: Payer: Self-pay

## 2019-12-31 ENCOUNTER — Other Ambulatory Visit (INDEPENDENT_AMBULATORY_CARE_PROVIDER_SITE_OTHER): Payer: PPO

## 2019-12-31 DIAGNOSIS — E875 Hyperkalemia: Secondary | ICD-10-CM | POA: Diagnosis not present

## 2020-01-01 LAB — BASIC METABOLIC PANEL
BUN: 26 mg/dL — ABNORMAL HIGH (ref 6–23)
CO2: 31 mEq/L (ref 19–32)
Calcium: 9.2 mg/dL (ref 8.4–10.5)
Chloride: 98 mEq/L (ref 96–112)
Creatinine, Ser: 1.08 mg/dL (ref 0.40–1.20)
GFR: 49.19 mL/min — ABNORMAL LOW (ref >60.00)
Glucose, Bld: 114 mg/dL — ABNORMAL HIGH (ref 70–99)
Potassium: 5.3 mEq/L — ABNORMAL HIGH (ref 3.5–5.1)
Sodium: 134 mEq/L — ABNORMAL LOW (ref 135–145)

## 2020-01-05 ENCOUNTER — Telehealth: Payer: Self-pay

## 2020-01-05 NOTE — Telephone Encounter (Signed)
Spoke with daughter [name] regarding moms lab results and will mail copy of food that are high in potassium to her daughter so this will help guide her mom on what not to eat.  Thank you,  Christy Gentles

## 2020-01-07 ENCOUNTER — Telehealth: Payer: Self-pay | Admitting: Family Medicine

## 2020-01-07 NOTE — Telephone Encounter (Signed)
Please let them know that this will be addressed next week when I am back in the office.  I will address the hardcopy fax when I am back in the office.

## 2020-01-07 NOTE — Telephone Encounter (Signed)
Megan Peterson called in wanted to know about the order due to medicare requires a certain type of wording for an office visit , they faxed what they needed it to say on 12/30.

## 2020-01-08 ENCOUNTER — Encounter: Payer: Self-pay | Admitting: Family Medicine

## 2020-01-19 NOTE — Telephone Encounter (Signed)
I have not seen any additional paperwork for this patient.  If they still have any requirements, please have them fax it to me again.  Thanks.

## 2020-01-20 NOTE — Telephone Encounter (Signed)
Left message for Melissa to see if there is anything else needed

## 2020-01-21 ENCOUNTER — Other Ambulatory Visit: Payer: Self-pay | Admitting: Family Medicine

## 2020-01-21 NOTE — Telephone Encounter (Signed)
Refill request for Hydrocodone-APAP 5-325 mg tabs.  LOV - 12/16/19  Next OV - not scheduled Last refilled - 11/12/19 #120/0

## 2020-01-21 NOTE — Telephone Encounter (Signed)
Sent. Thanks.   

## 2020-01-22 NOTE — Telephone Encounter (Signed)
Spoke with Melissa and she is going to check and see if there was anything else needed and will call back.

## 2020-01-29 DIAGNOSIS — J9611 Chronic respiratory failure with hypoxia: Secondary | ICD-10-CM | POA: Diagnosis not present

## 2020-02-19 ENCOUNTER — Encounter: Payer: Self-pay | Admitting: Family Medicine

## 2020-02-19 NOTE — Telephone Encounter (Signed)
FYI for you from the family.  They are concerned about the pt not wearing her oxygen.  They will be with her at the [facility] [place] on 03/04/2020

## 2020-02-20 NOTE — Telephone Encounter (Signed)
Patient is a patient of Dr. Elsworth Soho.  I am happy to see the patient but?  Is the patient switching to me by my doing acute visit?  In any event duly noted.  We will do a walking desaturation test and make a oxygen necessity assessment as well and will counseling as appropriate

## 2020-02-25 ENCOUNTER — Other Ambulatory Visit: Payer: Self-pay | Admitting: Pulmonary Disease

## 2020-02-25 ENCOUNTER — Other Ambulatory Visit: Payer: Self-pay | Admitting: Family Medicine

## 2020-02-25 NOTE — Telephone Encounter (Signed)
Pharmacy requests refill on: Metoprolol Tartrate 25 mg & Rosuvastatin 5 mg   LAST REFILL: 09/15/2019 (Q-90, R-1) LAST OV: 12/16/2019 NEXT OV: Not Scheduled  PHARMACY: [facility] Date: 03/15/2019

## 2020-02-29 DIAGNOSIS — J9611 Chronic respiratory failure with hypoxia: Secondary | ICD-10-CM | POA: Diagnosis not present

## 2020-03-04 ENCOUNTER — Ambulatory Visit: Payer: PPO | Admitting: Internal Medicine

## 2020-03-04 ENCOUNTER — Ambulatory Visit: Payer: PPO | Admitting: Pulmonary Disease

## 2020-03-05 ENCOUNTER — Other Ambulatory Visit: Payer: Self-pay | Admitting: Family Medicine

## 2020-03-12 ENCOUNTER — Other Ambulatory Visit: Payer: Self-pay | Admitting: Family Medicine

## 2020-03-12 NOTE — Telephone Encounter (Signed)
Refill request for Norco/vicodin 5-325 mg tablets  LOV - 12/16/19 Next OV - not scheduled Last refilled - 01/21/20 #120/0

## 2020-03-14 NOTE — Telephone Encounter (Signed)
Sent. Thanks.   
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
## 2020-03-17 ENCOUNTER — Other Ambulatory Visit: Payer: Self-pay

## 2020-03-17 ENCOUNTER — Ambulatory Visit: Payer: PPO | Admitting: Neurology

## 2020-03-17 ENCOUNTER — Encounter: Payer: Self-pay | Admitting: Neurology

## 2020-03-17 VITALS — BP 145/74 | HR 97 | Ht <= 58 in | Wt 132.4 lb

## 2020-03-17 DIAGNOSIS — F0280 Dementia in other diseases classified elsewhere without behavioral disturbance: Secondary | ICD-10-CM | POA: Diagnosis not present

## 2020-03-17 DIAGNOSIS — G309 Alzheimer's disease, unspecified: Secondary | ICD-10-CM | POA: Diagnosis not present

## 2020-03-17 NOTE — Patient Instructions (Signed)
Continue donepezil 10mg  at bedtime Limit driving to within 10 mile radius from home except to your daughter's house Follow up in 9 months

## 2020-03-28 DIAGNOSIS — J9611 Chronic respiratory failure with hypoxia: Secondary | ICD-10-CM | POA: Diagnosis not present

## 2020-03-29 ENCOUNTER — Encounter: Payer: Self-pay | Admitting: Pulmonary Disease

## 2020-03-29 ENCOUNTER — Ambulatory Visit: Payer: PPO | Admitting: Pulmonary Disease

## 2020-03-29 ENCOUNTER — Other Ambulatory Visit: Payer: Self-pay

## 2020-03-29 DIAGNOSIS — J432 Centrilobular emphysema: Secondary | ICD-10-CM

## 2020-03-29 DIAGNOSIS — J9611 Chronic respiratory failure with hypoxia: Secondary | ICD-10-CM | POA: Diagnosis not present

## 2020-03-29 NOTE — Assessment & Plan Note (Signed)
We discussed compliance with Anoro and ways of trying to remind her since she is having memory issues. Daughter is calling her every day to remind her We discussed signs and symptoms of exacerbation and she will call as needed

## 2020-03-29 NOTE — Assessment & Plan Note (Addendum)
Would like her to continue using nocturnal oxygen but she is resistant to the idea.  I have asked her to hold onto the oxygen should she have an exacerbation and need this again We will try to minimize her visits with [place] at her request

## 2020-03-29 NOTE — Progress Notes (Signed)
   Subjective:    Patient ID: Megan Peterson, female    DOB: 1940-04-08, 80 y.o.   MRN: [id number]  HPI  80 yo ex-smokerFUfor COPD, bronchiectasis and chronic respiratory failure on oxygen  PMH - chronic pain, alzheimer's  Chief Complaint  Patient presents with  . Follow-up    No complaints. Inhalers working well. No longer on oxygen   Accompanied by her daughter Megan Peterson Breathing is okay, she often forgets to take her Anoro.  She is not using portable concentrator and is return that to DME. She has stopped using her oxygen at night but still has the oxygen concentrator  Memory is getting worse  Significant tests/ events reviewed  08/11/2019-overnight oximetry on room air-duration of sleep 6 hours and 57 minutes, time spent below 88% 5 hours and 25 minutes, average SPO2 87, SpO2 low 61>>start 2L   ONO on 2L8/25/2021-duration of sleep 7 hours and 57 minutes, time spent below 88% 6 hours and 19 minutes, baseline oxygen level 87%, SpO2 low 69%>> increase to 4L during sleep  PFT06/2018 that showed similar lung function(compared 2013)with moderate to severe airflow obstruction with an FEV1 at 55%, ratio 52, positive bronchodilator response, FVC 80%, DLCO 56%.  CT angiogram 04/21/16 was negative for pulmonary embolism but showed new bilateral upper lobe groundglass infiltrates with mild bronchiectasis in both lower lobes  Review of Systems neg for any significant sore throat, dysphagia, itching, sneezing, nasal congestion or excess/ purulent secretions, fever, chills, sweats, unintended wt loss, pleuritic or exertional cp, hempoptysis, orthopnea pnd or change in chronic leg swelling. Also denies presyncope, palpitations, heartburn, abdominal pain, nausea, vomiting, diarrhea or change in bowel or urinary habits, dysuria,hematuria, rash, arthralgias, visual complaints, headache, numbness weakness or ataxia.     Objective:   Physical Exam  Gen. Pleasant, elderly,well-nourished, in  no distress ENT - no thrush, no pallor/icterus,no post nasal drip Neck: No JVD, no thyromegaly, no carotid bruits Lungs: no use of accessory muscles, no dullness to percussion, clear without rales or rhonchi  Cardiovascular: Rhythm regular, heart sounds  normal, no murmurs or gallops, no peripheral edema Musculoskeletal: No deformities, no cyanosis or clubbing        Assessment & Plan:

## 2020-03-29 NOTE — Patient Instructions (Signed)
Please remember to take your Anoro

## 2020-04-26 ENCOUNTER — Telehealth: Payer: Self-pay

## 2020-04-26 NOTE — Telephone Encounter (Signed)
[facility] - Client TELEPHONE ADVICE RECORD [facility] Patient Name: Megan Peterson N Gender: Female DOB: [birth year] Age: 80 Y 10 M 11 D Return Phone Number: [phone] (Primary), [phone] (Secondary) Address: City/ State/ Zip: [place] [address] Client [facility] - Client Client Site [facility] Physician Renford Dills - MD Contact Type Call Who Is Calling Patient / Member / Family / Caregiver Call Type Triage / Clinical Caller Name Ferman Hamming Relationship To Patient Daughter Return Phone Number [phone] (Primary) Chief Complaint Nausea Reason for Call Symptomatic / Request for [place] states her mother is nauseas, loss of taste. no appointments available in office today Translation No Nurse Assessment Nurse: Luvenia Heller, RN, Adriana Date/Time (Eastern Time): 04/26/2020 10:07:13 AM Confirm and document reason for call. If symptomatic, describe symptoms. ---Caller reports mother is nauseas since yesterday. Weakness and nausea. Afebrile, c/o constipation. Stool softner, bm this am, tolerating clear fluids okay. Does the patient have any new or worsening symptoms? ---Yes Will a triage be completed? ---Yes Related visit to physician within the last 2 weeks? ---No Does the PT have any chronic conditions? (i.e. diabetes, asthma, this includes High risk factors for pregnancy, etc.) ---Yes List chronic conditions. ---Dementia, COPD, Is this a behavioral health or substance abuse call? ---No Guidelines Guideline Title Affirmed Question Affirmed Notes Nurse Date/Time (Eastern Time) Nausea Unexplained nausea Luvenia Heller, RN, Fabio Bering 04/26/2020 10:09:43 AM Disp. Time Eilene Ghazi Time) Disposition Final User 04/26/2020 10:15:58 AM Home Care Yes Cisneros, RN, Adriana PLEASE NOTE: All timestamps contained within this report are represented as [place]  Standard Time. CONFIDENTIALTY NOTICE: This fax transmission is intended only for the addressee. It contains information that is legally privileged, confidential or otherwise protected from use or disclosure. If you are not the intended recipient, you are strictly prohibited from reviewing, disclosing, copying using or disseminating any of this information or taking any action in reliance on or regarding this information. If you have received this fax in error, please notify us immediately by telephone so that we can arrange for its return to [place]. Phone: [phone], Toll-Free: [phone], Fax: [phone] Page: 2 of 2 Call Id: [id number] Caller Disagree/Comply Comply Caller Understands Yes PreDisposition Call Doctor Care Advice Given Per Guideline * You should be able to treat this at home. * Nausea is often caused by a stomach virus or indigestion (e.g., from overeating, alcohol). Nausea usually passes in 1 or 2 days. * If nausea is your only symptom, it's usually not caused by anything serious. CLEAR FLUIDS: * Take clear fluids in small amounts until the nausea is resolved for 8 hours: * Sip water or rehydration liquid (Gatorade or Powerade) * Avoid NSAIDs, which can cause gastritis * Start with saltine crackers, white bread, rice, mashed potatoes, cereal, apple sauce etc. CARE ADVICE given per Nausea (Adult) guideline. CALL BACK IF: * You become worse

## 2020-04-26 NOTE — Telephone Encounter (Signed)
I spoke with [name](DPR signed). Pt did not sleep last night but [name] thinks pt forgot to take her bedtime meds. Today pt has generalized weakness, no weakness in arms or legs, no slurred speech and no more confusion than usual. Pt had slight H/A on 04/25/20 butno complaint of h/a today. Pt is presently asleep and [name] will let pt sleep for awhile and then keep a ck on her.pt has not cked pulse ox reading recently. [name] tried to ck on 04/25/20 but pt said she would do it later and did not let [name] ck oxygen level. Pt does not have dizziness, PC or SOB. Pt has complained of some nausea but [name] is encouraging liquids and pt is able to keep them down. Pt has not loss sense of taste but [name] said everything taste bad or sour. [name] will cb with update if pt has any more problems. UC & ED precautions given as well and [name] voiced understanding. Sending note to Dr Damita Dunnings.

## 2020-04-26 NOTE — Telephone Encounter (Signed)
If patient is resting comfortably now, then it makes sense for Megan Peterson to check patient a little later and seek eval is sx persist.  Please update [place] as needed.  Thanks.

## 2020-04-26 NOTE — Telephone Encounter (Signed)
[name] notified as instructed by telephone and she verbalized understanding. [name] stated that Mrs. Schmelzle is awake now and stated that she is feeling a lot better. [name] stated that patient's oxygen level is now 95% and heart rate is 88. [name] was given ER/UC precautions and she verbalized understanding.

## 2020-04-27 ENCOUNTER — Other Ambulatory Visit: Payer: Self-pay | Admitting: Family Medicine

## 2020-04-27 NOTE — Telephone Encounter (Signed)
Name of Medication: Hydrocodone-Acetaminophen 5-325 mg  Name of Pharmacy: [facility] or Written Date and Quantity: 03/14/2020 (Q-120, R-0) Last Office Visit and Type: 12/16/2019 Next Office Visit and Type: Not Scheduled  Last Controlled Substance Agreement Date: 11/06/2017 Last UDS: 11/06/2017

## 2020-04-27 NOTE — Telephone Encounter (Signed)
Noted. Thanks.

## 2020-04-28 DIAGNOSIS — J9611 Chronic respiratory failure with hypoxia: Secondary | ICD-10-CM | POA: Diagnosis not present

## 2020-04-28 NOTE — Telephone Encounter (Signed)
Sent. Thanks.   

## 2020-05-20 ENCOUNTER — Other Ambulatory Visit: Payer: Self-pay | Admitting: Neurology

## 2020-05-25 ENCOUNTER — Telehealth: Payer: Self-pay | Admitting: Family Medicine

## 2020-05-26 NOTE — Telephone Encounter (Signed)
Refill request for HYDROcodone-acetaminophen (NORCO/VICODIN) 5-325 MG tablet  LOV - 12/16/19 Next OV - not scheduled Last refill - 04/28/20 #120/0

## 2020-05-26 NOTE — Telephone Encounter (Signed)
Sent. Thanks.  Needs f/u OV when possible in the next month or so.

## 2020-05-27 NOTE — Telephone Encounter (Signed)
Please call to schedule f/u OV for patient.

## 2020-05-27 NOTE — Telephone Encounter (Signed)
Got the patient scheduled 6/20 @1030 

## 2020-05-28 DIAGNOSIS — J9611 Chronic respiratory failure with hypoxia: Secondary | ICD-10-CM | POA: Diagnosis not present

## 2020-06-10 ENCOUNTER — Telehealth: Payer: Self-pay | Admitting: Family Medicine

## 2020-06-10 NOTE — Progress Notes (Signed)
  Chronic Care Management   Note  06/10/2020 Name: Megan Peterson MRN: [id number] DOB: 1940-09-24  Megan Peterson is a 80 y.o. year old female who is a primary care patient of Tonia Ghent, MD. I reached out to Megan Peterson by phone today in response to a referral sent by Ms. Earlene Plater Kattner's PCP, Tonia Ghent, MD.   Ms. Hugh was given information about Chronic Care Management services today including:  CCM service includes personalized support from designated clinical staff supervised by her physician, including individualized plan of care and coordination with other care providers 24/7 contact phone numbers for assistance for urgent and routine care needs. Service will only be billed when office clinical staff spend 20 minutes or more in a month to coordinate care. Only one practitioner may furnish and bill the service in a calendar month. The patient may stop CCM services at any time (effective at the end of the month) by phone call to the office staff.   Megan Peterson verbally agreed to assistance and services provided by embedded care coordination/care management team today.  Follow up plan:   Tatjana Secretary/administrator

## 2020-06-10 NOTE — Chronic Care Management (AMB) (Signed)
  Chronic Care Management   Note  06/10/2020 Name: Megan Peterson MRN: [id number] DOB: [birth year]  Megan Peterson is a 80 y.o. year old female who is a primary care patient of Tonia Ghent, MD. I reached out to Megan Peterson by phone today in response to a referral sent by Ms. Earlene Plater Dileonardo's PCP, Tonia Ghent, MD.   Ms. Sallis was given information about Chronic Care Management services today including:  CCM service includes personalized support from designated clinical staff supervised by her physician, including individualized plan of care and coordination with other care providers 24/7 contact phone numbers for assistance for urgent and routine care needs. Service will only be billed when office clinical staff spend 20 minutes or more in a month to coordinate care. Only one practitioner may furnish and bill the service in a calendar month. The patient may stop CCM services at any time (effective at the end of the month) by phone call to the office staff.   Patient agreed to services and verbal consent obtained.   Follow up plan:   Tatjana Secretary/administrator

## 2020-06-21 ENCOUNTER — Ambulatory Visit: Payer: PPO

## 2020-06-21 ENCOUNTER — Ambulatory Visit: Payer: PPO | Admitting: Family Medicine

## 2020-06-24 ENCOUNTER — Other Ambulatory Visit: Payer: Self-pay

## 2020-06-24 ENCOUNTER — Ambulatory Visit (INDEPENDENT_AMBULATORY_CARE_PROVIDER_SITE_OTHER): Payer: PPO | Admitting: Family Medicine

## 2020-06-24 ENCOUNTER — Encounter: Payer: Self-pay | Admitting: Family Medicine

## 2020-06-24 DIAGNOSIS — R413 Other amnesia: Secondary | ICD-10-CM | POA: Diagnosis not present

## 2020-06-24 DIAGNOSIS — J479 Bronchiectasis, uncomplicated: Secondary | ICD-10-CM

## 2020-06-24 DIAGNOSIS — R21 Rash and other nonspecific skin eruption: Secondary | ICD-10-CM | POA: Diagnosis not present

## 2020-06-24 DIAGNOSIS — G8929 Other chronic pain: Secondary | ICD-10-CM

## 2020-06-24 MED ORDER — HYDROCODONE-ACETAMINOPHEN 5-325 MG PO TABS: 1.0000 | ORAL_TABLET | Freq: Four times a day (QID) | ORAL | 0 refills | Status: DC

## 2020-06-24 NOTE — Progress Notes (Signed)
This visit occurred during the SARS-CoV-2 public health emergency.  Safety protocols were in place, including screening questions prior to the visit, additional usage of staff PPE, and extensive cleaning of exam room while observing appropriate contact time as indicated for disinfecting solutions.  Memory loss with prev neuro eval d/w pt.  Still on donepezil.    Indication for chronic opioid:  Chronic back pain, at baseline.   Medication and dose: hydrocodone 5/325 # pills per month:  120 Last UDS date:  Pain contract signed (Y/N): yes Date narcotic database last reviewed (include red flags):06/24/20  Chronic pain. Still on hydrocodone and gabapentin.  She is watching TV at home.  Minimal driving, locally.  Family has been with her driving and reported she did well.  She isn't cooking much.  She is eating some fast food, out to eat occ.  Still living alone.  D/w pt about medic alert button.  No falls.  No constipation, not drowsy on med.  We talked about gradually letting her family help out with med mgmt, d/w pt.    Lower back pain is better in a recliner, usually sleeping in recliner.  Pain is usually in the lower back, less commonly radiating to the legs.  No gait changes.    Family is checking on patient.  D/w pt about med adherence and memory changes.  She isn't using inhalers every day.  Not SOB.  Not using O2 at night.  She didn't feel like she needed it most nights.  She uses it occ.  She was able to go shopping with family recent w/o getting winded.    D/w pt about stopping advil PM.    Itchy rash on the BLE.  Unclear source.  Used topical steroids prev.  Not itchy now.  She has a h/o similar in prev summers.      Meds, vitals, and allergies reviewed.   ROS: Per HPI unless specifically indicated in ROS section   GEN: nad, alert  HEENT: ncat NECK: supple w/o LA CV: rrr. PULM: ctab, no inc wob ABD: soft, +bs EXT: no edema SKIN: Blanching rash on bilateral shins.  No  ulceration. Lower midline back ttp w/o rash.

## 2020-06-24 NOTE — Patient Instructions (Signed)
Please let family check on your meds.  Let me know if the itching gets worse.  Plan on recheck in about 4 months.  We can do labs at the visit if needed.  Take care.  Glad to see you.

## 2020-06-27 NOTE — Assessment & Plan Note (Signed)
Continue hydrocodone as is.  I advised the patient to allow family to help her with medication organization.

## 2020-06-27 NOTE — Assessment & Plan Note (Signed)
No longer itchy.  May be resolving.  She can update me as needed.  See after visit summary.

## 2020-06-27 NOTE — Assessment & Plan Note (Signed)
See above.  Encouraged med adherence.

## 2020-06-27 NOTE — Assessment & Plan Note (Signed)
Encourage patient to allow family to help her with medication organization, etc.

## 2020-06-28 DIAGNOSIS — J9611 Chronic respiratory failure with hypoxia: Secondary | ICD-10-CM | POA: Diagnosis not present

## 2020-07-03 ENCOUNTER — Other Ambulatory Visit: Payer: Self-pay

## 2020-07-03 ENCOUNTER — Emergency Department (HOSPITAL_BASED_OUTPATIENT_CLINIC_OR_DEPARTMENT_OTHER)
Admit: 2020-07-03 | Discharge: 2020-07-03 | Disposition: A | Discharge status: Home / Self Care | Payer: PPO | Attending: Emergency Medicine | Admitting: Emergency Medicine

## 2020-07-03 ENCOUNTER — Encounter (HOSPITAL_COMMUNITY): Payer: Self-pay | Admitting: Emergency Medicine

## 2020-07-03 ENCOUNTER — Emergency Department (HOSPITAL_COMMUNITY): Payer: PPO

## 2020-07-03 ENCOUNTER — Emergency Department (HOSPITAL_COMMUNITY)
Admission: EM | Admit: 2020-07-03 | Discharge: 2020-07-03 | Disposition: A | Discharge status: Home / Self Care | Payer: PPO | Attending: Emergency Medicine | Admitting: Emergency Medicine

## 2020-07-03 DIAGNOSIS — Z87891 Personal history of nicotine dependence: Secondary | ICD-10-CM | POA: Insufficient documentation

## 2020-07-03 DIAGNOSIS — M7989 Other specified soft tissue disorders: Secondary | ICD-10-CM | POA: Diagnosis not present

## 2020-07-03 DIAGNOSIS — Z85828 Personal history of other malignant neoplasm of skin: Secondary | ICD-10-CM | POA: Insufficient documentation

## 2020-07-03 DIAGNOSIS — R600 Localized edema: Secondary | ICD-10-CM | POA: Diagnosis not present

## 2020-07-03 DIAGNOSIS — I10 Essential (primary) hypertension: Secondary | ICD-10-CM | POA: Insufficient documentation

## 2020-07-03 DIAGNOSIS — Z7951 Long term (current) use of inhaled steroids: Secondary | ICD-10-CM | POA: Insufficient documentation

## 2020-07-03 DIAGNOSIS — J449 Chronic obstructive pulmonary disease, unspecified: Secondary | ICD-10-CM | POA: Insufficient documentation

## 2020-07-03 DIAGNOSIS — R609 Edema, unspecified: Secondary | ICD-10-CM

## 2020-07-03 DIAGNOSIS — R0602 Shortness of breath: Secondary | ICD-10-CM | POA: Insufficient documentation

## 2020-07-03 DIAGNOSIS — Z79899 Other long term (current) drug therapy: Secondary | ICD-10-CM | POA: Insufficient documentation

## 2020-07-03 LAB — BASIC METABOLIC PANEL
Anion gap: 6 (ref 5–15)
BUN: 23 mg/dL (ref 8–23)
CO2: 28 mmol/L (ref 22–32)
Calcium: 8.8 mg/dL — ABNORMAL LOW (ref 8.9–10.3)
Chloride: 103 mmol/L (ref 98–111)
Creatinine, Ser: 1.14 mg/dL — ABNORMAL HIGH (ref 0.44–1.00)
GFR, Estimated: 49 mL/min — ABNORMAL LOW (ref >60)
Glucose, Bld: 106 mg/dL — ABNORMAL HIGH (ref 70–99)
Potassium: 4.6 mmol/L (ref 3.5–5.1)
Sodium: 137 mmol/L (ref 135–145)

## 2020-07-03 LAB — CBC
HCT: 38.4 % (ref 36.0–46.0)
Hemoglobin: 12.8 g/dL (ref 12.0–15.0)
MCH: 31.8 pg (ref 26.0–34.0)
MCHC: 33.3 g/dL (ref 30.0–36.0)
MCV: 95.5 fL (ref 80.0–100.0)
Platelets: 248 10*3/uL (ref 150–400)
RBC: 4.02 MIL/uL (ref 3.87–5.11)
RDW: 11.9 % (ref 11.5–15.5)
WBC: 8.5 10*3/uL (ref 4.0–10.5)
nRBC: 0 % (ref 0.0–0.2)

## 2020-07-03 LAB — TROPONIN I (HIGH SENSITIVITY)
Troponin I (High Sensitivity): 4 ng/L (ref <18)
Troponin I (High Sensitivity): 4 ng/L (ref <18)

## 2020-07-03 MED ORDER — FUROSEMIDE 20 MG PO TABS: 10.0000 mg | ORAL_TABLET | Freq: Every day | ORAL | 0 refills | Status: DC

## 2020-07-03 MED ORDER — FUROSEMIDE 10 MG/ML IJ SOLN
20.0000 mg | Freq: Once | INTRAMUSCULAR | Status: AC
  Administered 2020-07-03: 20 mg via INTRAVENOUS
  Filled 2020-07-03: qty 2

## 2020-07-03 NOTE — Progress Notes (Signed)
Bilateral lower extremity venous duplex has been completed. Preliminary results can be found in CV Proc through chart review.  Results were given to Domenic Moras PA.  07/03/20 2:28 PM Megan Peterson RVT
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
## 2020-07-03 NOTE — ED Notes (Addendum)
Patient discharge instructions reviewed with the patient. The patient verbalized understanding of instructions. Patient discharged. 

## 2020-07-03 NOTE — Discharge Instructions (Addendum)
Compression hose help leg swelling as well.

## 2020-07-03 NOTE — ED Triage Notes (Signed)
Pt woke up with SOB and bilateral leg swelling this morning.  Denies chest pain.

## 2020-07-06 ENCOUNTER — Telehealth: Payer: Self-pay

## 2020-07-06 NOTE — Telephone Encounter (Signed)
Pt was seen in the ER for leg edema and treated with lasix and d/c

## 2020-07-06 NOTE — Telephone Encounter (Signed)
Called and spoke with patient and she has not started the lasix yet. She said she can not cut the pills in half as they are tiny little pills. Legs are still swollen. I advised patient that I am not sure if Dr. Damita Dunnings would be able to determine dosage since he did not see her legs. Advised her she may need a visit here.   I called and spoke with the patients daughter [name] and asked if she could check on her mom and try to help her cut her pills in half so she can start the medication. She is going to try to go down there this afternoon and see what she can do. I advised her the same thing I advised the patient. Ulla Potash to call back with update tomorrow morning.

## 2020-07-06 NOTE — Telephone Encounter (Signed)
[facility] Night - Client TELEPHONE ADVICE RECORD AccessNurse Patient Name: Megan Peterson Gender: Female DOB: November 05, 1940 Age: 80 Y 18 D Return Phone Number: [phone] (Primary), [phone] (Secondary) Address[place]  [address] Client [facility] Night - Client Client Site [facility] Physician Renford Dills - MD Contact Type Call Who Is Calling Patient / Member / Family / Caregiver Call Type Triage / Clinical Caller Name Ferman Hamming Relationship To Patient Daughter Return Phone Number [phone] (Secondary) Chief Complaint Leg Swelling And Edema Reason for Call Symptomatic / Request for [place] states her mom says she has swelling in both feet and ankles this morning. Additional Comment Caller may not answer, she is geting stitches in her hand. Translation No Nurse Assessment Nurse: Norlene Duel, RN, Beth Date/Time (Eastern Time): 07/03/2020 10:21:06 AM Confirm and document reason for call. If symptomatic, describe symptoms. ---Caller states her mom says she has swelling in both feet and ankles up to knee. The left leg is more swollen than the right. Symptoms started this am. Does the patient have any new or worsening symptoms? ---Yes Will a triage be completed? ---Yes Related visit to physician within the last 2 weeks? ---No Does the PT have any chronic conditions? (i.e. diabetes, asthma, this includes High risk factors for pregnancy, etc.) ---Yes List chronic conditions. ---Neuropathy; Back pain; Is this a behavioral health or substance abuse call? ---No Guidelines Guideline Title Affirmed Question Affirmed Notes Nurse Date/Time Eilene Ghazi Time) Leg Swelling and Edema [1] Difficulty breathing with exertion (e.g., walking) AND [2] new-onset or worsening Demos, RN, Beth 07/03/2020 10:27:20 AM PLEASE NOTE: All timestamps  contained within this report are represented as [place] Standard Time. CONFIDENTIALTY NOTICE: This fax transmission is intended only for the addressee. It contains information that is legally privileged, confidential or otherwise protected from use or disclosure. If you are not the intended recipient, you are strictly prohibited from reviewing, disclosing, copying using or disseminating any of this information or taking any action in reliance on or regarding this information. If you have received this fax in error, please notify us immediately by telephone so that we can arrange for its return to [place]. Phone: [phone], Toll-Free: [phone], Fax: [phone] Page: 2 of 2 Call Id: [id number] [place]. Time Eilene Ghazi Time) Disposition Final User 07/03/2020 10:35:49 AM Go to ED Now (or PCP triage) Yes Demos, RN, Beth Caller Disagree/Comply Comply Caller Understands Yes PreDisposition Call Doctor Care Advice Given Per Guideline GO TO ED NOW (OR PCP TRIAGE): * It is better and safer if another adult drives instead of you. ANOTHER ADULT SHOULD DRIVE: * IF PCP SECOND-LEVEL TRIAGE REQUIRED: You may need to be seen. Your doctor (or NP/PA) will want to talk with you to decide what's best. I'll page the provider on-call now. If you haven't heard from the provider (or me) within 30 minutes, go directly to the [facility] at _____________ [place] given per Leg Swelling and Edema (Adult) guideline. Comments User: Talmadge Chad, RN Date/Time (Eastern Time): 07/03/2020 10:29:14 AM Pt reports sores on both legs located from knee to ankles on both legs. Referrals GO TO FACILITY UNDECIDED

## 2020-07-06 NOTE — Telephone Encounter (Signed)
Please check on patient re: ER f/u.  Thanks.

## 2020-07-07 NOTE — Telephone Encounter (Signed)
Agree. Thanks

## 2020-07-21 ENCOUNTER — Telehealth: Payer: Self-pay

## 2020-07-21 NOTE — Chronic Care Management (AMB) (Addendum)
Chronic Care Management Pharmacy Assistant   Name: Megan Peterson  MRN: [id number] DOB: 22-Sep-1940  Megan Peterson is an 80 y.o. year old female who presents for her initial CCM visit with the clinical pharmacist.  Reason for Encounter: Initial Questions   Conditions to be addressed/monitored: HTN, HLD, and COPD  Recent office visits:  06/24/20 - Megan Peterson PCP - Encouraged patient to allow family to help her with medication organization, etc. Follow up 4 months  Recent consult visits:  07/03/20 Megan Peterson ED peripheral edema started Lasix 20mg  take 1 tablet daily  03/29/20 - Pulmonology no medication changes 03/17/20 - Neurology continue Aricept 10mg  at bedtime  Hospital visits:  07/03/20 - Megan Peterson ED  peripheral edema  started Lasix 20mg  take 1 tablet daily    Medications: Outpatient Encounter Medications as of 07/21/2020  Medication Sig   albuterol (VENTOLIN HFA) 108 (90 Base) MCG/ACT inhaler INHALE 1-2 PUFFS INTO THE LUNGS EVERY 6 HOURS AS NEEDED FOR WHEEZING OR SHORTNESS OF BREATH   ANORO ELLIPTA 62.5-25 MCG/INH AEPB INHALE 1 PUFF INTO THE LUNGS ONCE DAILY   donepezil (ARICEPT) 10 MG tablet TAKE 1 TABLET BY MOUTH EVERY NIGHT AT BEDTIME   furosemide (LASIX) 20 MG tablet Take 0.5 tablets (10 mg total) by mouth daily.   gabapentin (NEURONTIN) 800 MG tablet TAKE 1 TABLET BY MOUTH 3 TIMES DAILY   HYDROcodone-acetaminophen (NORCO/VICODIN) 5-325 MG tablet Take 1 tablet by mouth every 6 (six) hours.   metoprolol tartrate (LOPRESSOR) 25 MG tablet TAKE 1/2 A TABLET BY MOUTH (12.5 MG) TWICE DAILY   OXYGEN Inhale 3.5 L into the lungs at bedtime.   rosuvastatin (CRESTOR) 5 MG tablet TAKE 1 TABLET BY MOUTH ONCE DAILY AT 6PM   No facility-administered encounter medications on file as of 07/21/2020.    No results found for: HGBA1C, MICROALBUR   BP Readings from Last 3 Encounters:  07/03/20 (!) 165/90  06/24/20 120/70  03/29/20 124/78    Patient contacted to review initial questions  prior to visit with [name]. Spoke to the daughter, Megan Peterson, due to the patient has dementia.  Have you seen any other providers since your last visit with PCP?  07/03/20 - Megan Peterson ED  peripheral edema  started Lasix 20mg  take 1 tablet daily   Any changes in your medications or health? No  Any side effects from any medications? No  Do you have an symptoms or problems not managed by your medications? No the patient has oxygen at home, she will use inhalers for her breathing   Any concerns about your health right now? No the daughter Megan Peterson states Megan Peterson's memory issues are progressing  Has your provider asked that you check blood pressure, blood sugar, or follow special diet at home? The patient does not check blood pressure or blood sugar and is supposed to follow a low potassium diet.  Do you get any type of exercise on a regular basis? No  Can you think of a goal you would like to reach for your health? No  Do you have any problems getting your medications? No the pharmacy sends her medications in packs and she always takes them out and puts them in bottles.The patient does not like a pillbox, several methods of preparing her medications to take have been tried.  Is there anything that you would like to discuss during the appointment? No   Megan Peterson was reminded to have all medications, supplements and any blood glucose  and blood pressure readings available for review with [name], Pharm. D, at her telephone visit on 07/28/2020 at 2:00pm.    Star Rating Drugs:  Medication:  Last Fill: Day Supply Rosuvastatin 5mg  05/20/20 90   [name], CPP notified  [name], [facility] Assistant [phone]  I have reviewed the care management and care coordination activities outlined in this encounter and I am certifying that I agree with the content of this note. No further action required.  [name], PharmD Clinical Pharmacist Megan Peterson  Primary Care at [facility] [phone]

## 2020-07-28 ENCOUNTER — Other Ambulatory Visit: Payer: Self-pay | Admitting: Family Medicine

## 2020-07-28 ENCOUNTER — Telehealth: Payer: Self-pay | Admitting: Pulmonary Disease

## 2020-07-28 ENCOUNTER — Telehealth: Payer: Self-pay

## 2020-07-28 ENCOUNTER — Other Ambulatory Visit: Payer: Self-pay

## 2020-07-28 ENCOUNTER — Ambulatory Visit (INDEPENDENT_AMBULATORY_CARE_PROVIDER_SITE_OTHER): Payer: PPO

## 2020-07-28 DIAGNOSIS — I10 Essential (primary) hypertension: Secondary | ICD-10-CM

## 2020-07-28 DIAGNOSIS — E785 Hyperlipidemia, unspecified: Secondary | ICD-10-CM | POA: Diagnosis not present

## 2020-07-28 DIAGNOSIS — J9611 Chronic respiratory failure with hypoxia: Secondary | ICD-10-CM | POA: Diagnosis not present

## 2020-07-28 MED ORDER — PREDNISONE 10 MG PO TABS: ORAL_TABLET | ORAL | 0 refills | Status: DC

## 2020-07-28 MED ORDER — METOPROLOL SUCCINATE ER 25 MG PO TB24: 25.0000 mg | ORAL_TABLET | Freq: Every day | ORAL | 3 refills | Status: DC

## 2020-07-28 NOTE — Telephone Encounter (Signed)
Called and spoke with pt's daughter [name] who states pt has had increased SOB x2 days. Pt denies any complaints of fever as temp today was 98.7, no wheezing, no coughing.  Pt has had to use her rescue inhaler twice daily to see if it would help. Pt is using her anoro every day as prescribed.  Pt is not using her O2 much at night due to not able to sleep with  it on but is using it quite frequently during the day at 3L.  [name] and pt wants to know what we can recommend to help with pt's symptoms.  First avail appt with APP is not until 8/10.  Dr. Elsworth Soho, please advise.

## 2020-07-28 NOTE — Patient Instructions (Signed)
July 28, 2020  Dear Megan Peterson,  It was a pleasure meeting you during our initial appointment on July 28, 2020. Below is a summary of the goals we discussed and components of chronic care management. Please contact me anytime with questions or concerns.   Visit Information  Patient Care Plan: CCM Pharmacy Care Plan     Problem Identified: CHL AMB "PATIENT-SPECIFIC PROBLEM"      Long-Range Goal: Disease Management   Start Date: 07/28/2020  Priority: High  Note:   Current Barriers:  Unable to self administer medications as prescribed  Pharmacist Clinical Goal(s):  Patient will contact provider office for questions/concerns as evidenced notation of same in electronic health record through collaboration with PharmD and provider.   Interventions: 1:1 collaboration with Tonia Ghent, MD regarding development and update of comprehensive plan of care as evidenced by provider attestation and co-signature Inter-disciplinary care team collaboration (see longitudinal plan of care) Comprehensive medication review performed; medication list updated in electronic medical record   Medication Management -Poor adherence -Per [name], she does well remembering her gabapentin and hydrocodone TID. She has not taken her metoprolol, rosuvastatin, or donepezil lately.  -Reports she prefers less medications. Daughter, [name], has placed the AM pill bottles in a separate bag and PM in separate bags and calls patient daily at 8, 2 and 8 to check in on her. Sometimes she gets confused on whether she has taken them or not yet. She will check bottle later on and note pt did not take them despite calling. - She denies side effects with her medications.  - Patient denies having a dementia diagnosis. She prefers to continue driving to pharmacy and self-admin meds.  - She has tried pill packs and pill planner and patient did not take as prescribed. She prefers vials so she can read the name/dosing.  -Suggested  taking the metoprolol, rosuvastatin, and donepezil all in morning to simply routine.  Hypertension   (BP goal <140/90) -Unable to assess, patient not taking as prescribed - unable to half tablets -Current treatment: Metoprolol 25 mg - 1/2 tablet twice daily -Medications previously tried: none reported  -Current home readings: does not monitor home BP -Denies hypotensive including dizziness or falls/Denies hypertensive symptoms -Educated on Importance of home blood pressure monitoring; -Counseled to monitor BP at home blood pressure to determine if medication is needed, document, and provide log at future appointments -Collaborated with PCP to see if we can avoid tablet splitting.  Hyperlipidemia: (LDL goal < 70) -Unable to assess - lipid panel last updated 11/2018 -Pt does not always take nighttime medications daily. -Current treatment: Rosuvastatin 5 mg - 1 tablet daily at 6 PM -Medications previously tried: none reported  -Educated on may take statin in morning if that will improve adherence -Recommended to continue current medication  COPD (Goal: control symptoms and prevent exacerbations) -Not ideally controlled - Pt called in due to SOB this morning. She is about to start prednisone course. She does not take Anoro daily or wear Oxygen as prescribed. She does not want to become dependent on the oxygen. She is forgetful about whether she has used her inhaler or not despite daughter calling to remind her. She is on a very simple inhaler routine so I am not sure there is anything we can do to help with adherence. -Followed by pulmonology  -Current treatment  Anoro Ellipta - 1 puff daily Albuterol HFA - 1-2 puffs every 6 hours as needed Oxygen - at night  -Medications previously  tried: none reported  -Exacerbations requiring treatment in last 6 months: yes - currently requiring steroid treatment -Patient denies consistent use of maintenance inhaler -Frequency of rescue inhaler use:  unsure -Recommended to continue current medication  Memory Impairment (Goal: Prevent memory decline) -Followed by neurology -Pt does not take the medication every night as prescribed. Suggested taking her bedtime meds in morning if this will help with adherence.  -Current treatment: Aricept 10 mg - 1 tablet daily at bedtime  -Medications previously tried/failed: none -Recommended to continue current medication  Other - OTCs Advil PM - She takes 1 tablet every night for sleep. Suggested trial off this due to potential worsening on memory impairment. [name] reports Dr. Damita Dunnings recommended continuing Advil PM for sleep.  Patient Goals/Self-Care Activities Patient will:  - take medications as prescribed     [name] was given information about Chronic Care Management services today including:  CCM service includes personalized support from designated clinical staff supervised by her physician, including individualized plan of care and coordination with other care providers 24/7 contact phone numbers for assistance for urgent and routine care needs. Standard insurance, coinsurance, copays and deductibles apply for chronic care management only during months in which we provide at least 20 minutes of these services. Most insurances cover these services at 100%, however patients may be responsible for any copay, coinsurance and/or deductible if applicable. This service may help you avoid the need for more expensive face-to-face services. Only one practitioner may furnish and bill the service in a calendar month. The patient may stop CCM services at any time (effective at the end of the month) by phone call to the office staff.  Patient agreed to services and verbal consent obtained.   The patient verbalized understanding of instructions, educational materials, and care plan provided today and declined offer to receive copy of patient instructions, educational materials, and care plan.   [name], PharmD Clinical Pharmacist [facility] at [facility] [phone]

## 2020-07-28 NOTE — Telephone Encounter (Signed)
Called and spoke to pt's daughter, [name]. Informed her of the recs per RA. Script sent to preferred pharmacy and appt scheduled for 8/10 with Rexene Edison, NP. [name] verbalized understanding and denied any further questions or concerns at this time.

## 2020-07-28 NOTE — Telephone Encounter (Signed)
I would change to metoprolol ER '25mg'$  once daily.  New rx sent.  Thanks.

## 2020-07-28 NOTE — Telephone Encounter (Signed)
Patient's daughter, [name], reports pt unable to cut metoprolol in half so she is not taking routinely. She asked about taking 1 tablet once daily. Please advise.   [name], PharmD Clinical Pharmacist [facility] at [facility] [phone]

## 2020-07-28 NOTE — Progress Notes (Signed)
Chronic Care Management Pharmacy Note  07/28/2020 Name:  Megan Peterson MRN:  [id number] DOB:  03-01-1940  Subjective: Megan Peterson is an 80 y.o. year old female who is a primary patient of Megan Peterson, Megan Rising, MD.  The CCM team was consulted for assistance with disease management and care coordination needs.    Engaged with patient by telephone for initial visit in response to provider referral for pharmacy case management and/or care coordination services. Spoke with patient and her daughter, Megan Peterson.  Consent to Services:  The patient was given the following information about Chronic Care Management services today, agreed to services, and gave verbal consent: 1. CCM service includes personalized support from designated clinical staff supervised by the primary care provider, including individualized plan of care and coordination with other care providers 2. 24/7 contact phone numbers for assistance for urgent and routine care needs. 3. Service will only be billed when office clinical staff spend 20 minutes or more in a month to coordinate care. 4. Only one practitioner may furnish and bill the service in a calendar month. 5.The patient may stop CCM services at any time (effective at the end of the month) by phone call to the office staff. 6. The patient will be responsible for cost sharing (co-pay) of up to 20% of the service fee (after annual deductible is met). Patient agreed to services and consent obtained.  Patient Care Team: Tonia Ghent, MD as PCP - General (Family Medicine) Druscilla Brownie, MD (Dermatology) Birder Robson, MD as Referring Physician (Ophthalmology) [name], [facility] as Pharmacist (Pharmacist)   Recent office visits:  06/24/20 - Dr.Duncan PCP -  Memory loss, chronic pain follow up. Concerned about med adherence. Encouraged patient to allow family to help her with medication organization, etc. Follow up 4 months.   Recent consult visits:  03/29/20 - Pulmonology -  Pt presents for follow up COPD, chronic respiratory failure. Discussed compliance with Anoro and nocturnal oxygen.  03/17/20 - Neurology - Pt presents for memory impairment. Continue Aricept 51m at bedtime. Follow up 9 months.   [facility]  [facility] to [facility]. Start Lasix 29mtake 1 tablet daily.   Objective:  Lab Results  Component Value Date   CREATININE 1.14 (H) 07/03/2020   BUN 23 07/03/2020   GFR 49.19 (L) 12/31/2019   GFRNONAA 49 (L) 07/03/2020   GFRAA 58 (L) 02/04/2019   NA 137 07/03/2020   K 4.6 07/03/2020   CALCIUM 8.8 (L) 07/03/2020   CO2 28 07/03/2020   GLUCOSE 106 (H) 07/03/2020    Lab Results  Component Value Date/Time   GFR 49.19 (L) 12/31/2019 04:02 PM   GFR 42.93 (L) 12/16/2019 09:20 AM     Lab Results  Component Value Date   CHOL 256 (H) 11/12/2018   HDL 55.30 11/12/2018   LDLCALC 176 (H) 11/12/2018   LDLDIRECT 143.1 05/20/2012   TRIG 125.0 11/12/2018   CHOLHDL 5 11/12/2018    Hepatic Function Latest Ref Rng & Units 12/16/2019 02/04/2019 10/03/2018  Total Protein 6.0 - 8.3 g/dL 6.7 6.7 6.7  Albumin 3.5 - 5.2 g/dL 4.1 4.4 4.2  AST 0 - 37 U/L '23 19 18  ' ALT 0 - 35 U/L '12 9 10  ' Alk Phosphatase 39 - 117 U/L 98 95 83  Total Bilirubin 0.2 - 1.2 mg/dL 0.4 0.3 0.5  Bilirubin, Direct 0.0 - 0.3 mg/dL - - -    Lab Results  Component Value  Date/Time   TSH 3.06 12/16/2019 09:20 AM   TSH 1.90 10/03/2018 10:09 AM    CBC Latest Ref Rng & Units 07/03/2020 12/16/2019 02/04/2019  WBC 4.0 - 10.5 K/uL 8.5 8.5 8.6  Hemoglobin 12.0 - 15.0 g/dL 12.8 12.7 13.7  Hematocrit 36.0 - 46.0 % 38.4 38.0 40.6  Platelets 150 - 400 K/uL 248 241.0 237    Lab Results  Component Value Date/Time   VD25OH 34.83 11/12/2018 07:54 AM   VD25OH 22.30 (L) 04/13/2017 12:52 PM   Clinical ASCVD: Yes  The 10-year ASCVD risk score Megan Bussing DC Jr., et al., 2013) is: 45.8%   Values used to calculate the score:     Age: 29 years     Sex:  Female     Is Non-Hispanic African American: No     Diabetic: No     Tobacco smoker: No     Systolic Blood Pressure: 324 mmHg     Is BP treated: Yes     HDL Cholesterol: 55.3 mg/dL     Total Cholesterol: 256 mg/dL    Depression screen [facility] 2/9 12/16/2019 11/18/2018 11/06/2017  Decreased Interest 0 0 0  Down, Depressed, Hopeless 0 0 0  PHQ - 2 Score 0 0 0  Altered sleeping - 0 -  Tired, decreased energy - 0 -  Change in appetite - 0 -  Feeling bad or failure about yourself  - 0 -  Trouble concentrating - 0 -  Moving slowly or fidgety/restless - 0 -  Suicidal thoughts - 0 -  PHQ-9 Score - 0 -  Difficult doing work/chores - Not difficult at all -  Some recent data might be hidden    Social History   Tobacco Use  Smoking Status Former   Types: Cigarettes   Quit date: 01/03/1991   Years since quitting: 29.5  Smokeless Tobacco Never   BP Readings from Last 3 Encounters:  07/03/20 (!) 165/90  06/24/20 120/70  03/29/20 124/78   Peterson Readings from Last 3 Encounters:  07/03/20 98  06/24/20 86  03/29/20 84   Wt Readings from Last 3 Encounters:  06/24/20 132 lb (59.9 kg)  03/29/20 134 lb (60.8 kg)  03/17/20 132 lb 6.4 oz (60.1 kg)   BMI Readings from Last 3 Encounters:  06/24/20 26.66 kg/m  03/29/20 27.06 kg/m  03/17/20 27.67 kg/m    Assessment/Interventions: Review of patient past medical history, allergies, medications, health status, including review of consultants reports, laboratory and other test data, was performed as part of comprehensive evaluation and provision of chronic care management services.   SDOH:  (Social Determinants of Health) assessments and interventions performed: Yes SDOH Interventions    Flowsheet Row Most Recent Value  SDOH Interventions   Financial Strain Interventions Intervention Not Indicated      SDOH Screenings   Alcohol Screen: Not on file  Depression (PHQ2-9): Low Risk    PHQ-2 Score: 0  Financial Resource Strain: Low Risk     Difficulty of Paying Living Expenses: Not very hard  Food Insecurity: Not on file  Housing: Not on file  Physical Activity: Not on file  Social Connections: Not on file  Stress: Not on file  Tobacco Use: Medium Risk   Smoking Tobacco Use: Former   Smokeless Tobacco Use: Never  Transportation Needs: Not on file    [place]  Allergies  Allergen Reactions   Buspar [Buspirone] Other (See Comments)    Dizzy and nauseated.   Cymbalta [Duloxetine Hcl] Other (See  Comments)    fatigue   Lexapro [Escitalopram Oxalate] Other (See Comments)    Per patient, intolerant   Meperidine Hcl Other (See Comments)    unknown   Morphine And Related Other (See Comments)    Vomiting    Statins Other (See Comments)    Myalgias on some statins but able to tolerate crestor.      Medications Reviewed Today     Reviewed by [name], [facility] (Pharmacist) on 07/28/20 at 1444  Med List Status: <None>   Medication Order Taking? Sig Documenting Provider Last Dose Status Informant  albuterol (VENTOLIN HFA) 108 (90 Base) MCG/ACT inhaler [id number] Yes INHALE 1-2 PUFFS INTO THE LUNGS EVERY 6 HOURS AS NEEDED FOR WHEEZING OR SHORTNESS OF Willia Craze, MD Taking Active   [facility] ELLIPTA 62.5-25 MCG/INH AEPB [id number] Yes INHALE 1 PUFF INTO THE LUNGS ONCE DAILY Tonia Ghent, MD Taking Active   donepezil (ARICEPT) 10 MG tablet [id number] Yes TAKE 1 TABLET BY MOUTH EVERY NIGHT AT BEDTIME Pieter Partridge, DO Taking Active   gabapentin (NEURONTIN) 800 MG tablet [id number] Yes TAKE 1 TABLET BY MOUTH 3 TIMES DAILY Tonia Ghent, MD Taking Active   HYDROcodone-acetaminophen (NORCO/VICODIN) 5-325 MG tablet [id number] Yes Take 1 tablet by mouth every 6 (six) hours. Tonia Ghent, MD Taking Active   metoprolol tartrate (LOPRESSOR) 25 MG tablet [id number] Yes TAKE 1/2 A TABLET BY MOUTH (12.5 MG) TWICE DAILY Tonia Ghent, MD Taking Active   OXYGEN [id number] Yes Inhale 3.5 L into the lungs at bedtime.  [provider] Taking Active   predniSONE (DELTASONE) 10 MG tablet [id number] Yes Take 2 tablets with food for 5 days, then take 1 tablet for 5 days then stop Rigoberto Noel, MD Taking Active   rosuvastatin (CRESTOR) 5 MG tablet [id number] Yes TAKE 1 TABLET BY MOUTH ONCE DAILY AT Larina Bras, MD Taking Active             Patient Active Problem List   Diagnosis Date Noted   Medication management 07/29/2019   Joint pain 04/23/2019   Memory loss 10/06/2018   Neck pain 04/02/2018   Rash 11/18/2017   Bursitis 08/03/2017   Health care maintenance 10/18/2016   Hypertension 10/18/2016   TIA (transient ischemic attack) 10/18/2016   Radicular leg pain 07/30/2016   Left knee pain 07/10/2016   Tachycardia 05/01/2016   Chronic respiratory failure with hypoxia ([place]) 04/21/2016   COPD with acute exacerbation ([place]) 04/21/2016   Bronchiectasis ([place])    Encounter for chronic pain management 01/18/2016   Vitamin D deficiency 02/25/2015   Osteopenia 01/02/2014   Medicare annual wellness visit, subsequent 12/12/2013   Advanced care planning/counseling discussion 12/12/2013   Right carotid bruit 12/12/2013   Generalized anxiety disorder    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   CHEST PAIN, ATYPICAL 05/16/2007   HLD (hyperlipidemia) 09/26/2006    Immunization History  Administered Date(s) Administered   Fluad Quad(high Dose 65+) 11/19/2018, 10/02/2019   Influenza, High Dose Seasonal PF 10/13/2014   Influenza,inj,Quad PF,6+ Mos 09/17/2013, 10/17/2016   Influenza-Unspecified 12/09/2015, 11/02/2017   PFIZER(Purple Top)SARS-COV-2 Vaccination 02/16/2019, 03/11/2019, 01/09/2020   Pneumococcal Conjugate-13 03/11/2013   Pneumococcal Polysaccharide-23 06/05/2011   Tdap 06/07/2011   Zoster Recombinat (Shingrix) 11/26/2018   Zoster, Live 09/06/2011    Conditions to be addressed/monitored:  Hypertension, Hyperlipidemia, and COPD  Care Plan  : [place]  Updates made by [name], [facility] since 07/28/2020  12:00 AM     Problem: CHL AMB "PATIENT-SPECIFIC PROBLEM"      Long-Range Goal: Disease Management   Start Date: 07/28/2020  Priority: High  Note:   Current Barriers:  Unable to self administer medications as prescribed  Pharmacist Clinical Goal(s):  Patient will contact provider office for questions/concerns as evidenced notation of same in electronic health record through collaboration with PharmD and provider.   Interventions: 1:1 collaboration with Tonia Ghent, MD regarding development and update of comprehensive plan of care as evidenced by provider attestation and co-signature Inter-disciplinary care team collaboration (see longitudinal plan of care) Comprehensive medication review performed; medication list updated in electronic medical record   Medication Management -Poor adherence -Per Megan Peterson, she does well remembering her gabapentin and hydrocodone TID. She has not taken her metoprolol, rosuvastatin, or donepezil lately.  -Reports she prefers less medications. Daughter, Megan Peterson, has placed the AM pill bottles in a separate bag and PM in separate bags and calls patient daily at 8, 2 and 8 to check in on her. Sometimes she gets confused on whether she has taken them or not yet. She will check bottle later on and note pt did not take them despite calling. - She denies side effects with her medications.  - Patient denies having a dementia diagnosis. She prefers to continue driving to pharmacy and self-admin meds.  - She has tried pill packs and pill planner and patient did not take as prescribed. She prefers vials so she can read the name/dosing.  -Suggested taking the metoprolol, rosuvastatin, and donepezil all in morning to simply routine.  Hypertension   (BP goal <140/90) -Unable to assess, patient not taking as prescribed - unable to half tablets -Current treatment: Metoprolol 25 mg - 1/2  tablet twice daily -Medications previously tried: none reported  -Current home readings: does not monitor home BP -Denies hypotensive including dizziness or falls/Denies hypertensive symptoms -Educated on Importance of home blood pressure monitoring; -Counseled to monitor BP at home blood pressure to determine if medication is needed, document, and provide log at future appointments -Collaborated with PCP to see if we can avoid tablet splitting.  Hyperlipidemia: (LDL goal < 70) -Unable to assess - lipid panel last updated 11/2018 -Pt does not always take nighttime medications daily. -Current treatment: Rosuvastatin 5 mg - 1 tablet daily at 6 PM -Medications previously tried: none reported  -Educated on may take statin in morning if that will improve adherence -Recommended to continue current medication  COPD (Goal: control symptoms and prevent exacerbations) -Not ideally controlled - Pt called in due to SOB this morning. She is about to start prednisone course. She does not take Anoro daily or wear Oxygen as prescribed. She does not want to become dependent on the oxygen. She is forgetful about whether she has used her inhaler or not despite daughter calling to remind her. She is on a very simple inhaler routine so I am not sure there is anything we can do to help with adherence. -Followed by pulmonology  -Current treatment  Anoro Ellipta - 1 puff daily Albuterol HFA - 1-2 puffs every 6 hours as needed Oxygen - at night  -Medications previously tried: none reported  -Exacerbations requiring treatment in last 6 months: yes - currently requiring steroid treatment -Patient denies consistent use of maintenance inhaler -Frequency of rescue inhaler use: unsure -Recommended to continue current medication  Memory Impairment (Goal: Prevent memory decline) -Followed by neurology -Pt does not take the medication every night as prescribed. Suggested taking  her bedtime meds in morning if this will  help with adherence.  -Current treatment: Aricept 10 mg - 1 tablet daily at bedtime  -Medications previously tried/failed: none -Recommended to continue current medication  Other - OTCs Advil PM - She takes 1 tablet every night for sleep. Suggested trial off this due to potential worsening on memory impairment. Megan Peterson reports Dr. Damita Peterson recommended continuing Advil PM for sleep.  Patient Goals/Self-Care Activities Patient will:  - take medications as prescribed     Follow Up Plan: Telephone follow up appointment with care management team member scheduled for:  12 months   Medication Assistance: None required.  Patient affirms current coverage meets needs.  Patient's preferred pharmacy is:  [facility], [place] [address] Phone: [phone] Fax: [phone]  Uses pill box? No - patient denies interest in pillbox/pill packs or delivery, she likes to get out and about  Pt endorses poor compliance  We discussed: Current pharmacy is preferred with insurance plan and patient is satisfied with pharmacy services Patient decided to: Continue current medication management strategy  Care Plan and Follow Up Patient Decision:  Patient agrees to Care Plan and Follow-up.  [name], PharmD Clinical Pharmacist [facility] at [facility] [phone]

## 2020-07-29 NOTE — Telephone Encounter (Signed)
Refill request for Hydrocodone 5-325 mg tablets  LOV - 06/24/20 Next OV - 10/28/20 Last refill - 06/24/20

## 2020-07-29 NOTE — Telephone Encounter (Signed)
Left message on VM that rx was sent for patient to take one tablet once a day. Advised to call back if theres any questions.

## 2020-08-11 ENCOUNTER — Other Ambulatory Visit: Payer: Self-pay | Admitting: Family Medicine

## 2020-08-11 ENCOUNTER — Encounter: Payer: Self-pay | Admitting: Adult Health

## 2020-08-11 ENCOUNTER — Ambulatory Visit: Payer: PPO | Admitting: Adult Health

## 2020-08-11 ENCOUNTER — Other Ambulatory Visit: Payer: Self-pay

## 2020-08-11 DIAGNOSIS — J9611 Chronic respiratory failure with hypoxia: Secondary | ICD-10-CM

## 2020-08-11 DIAGNOSIS — J479 Bronchiectasis, uncomplicated: Secondary | ICD-10-CM

## 2020-08-11 DIAGNOSIS — J432 Centrilobular emphysema: Secondary | ICD-10-CM

## 2020-08-11 NOTE — Patient Instructions (Addendum)
Continue on ANORO 1 puff daily  Albuterol inhaler As needed   Flu shot this fall  2nd Covid booster when due .  Activity as tolerated.  Oxygen 2l/m with activity as needed.  Follow up with Dr. Elsworth Soho  in 6 months and  As needed

## 2020-08-11 NOTE — Assessment & Plan Note (Signed)
Recent exacerbation now resolved.  Continue on current regimen  Plan  Patient Instructions  Continue on ANORO 1 puff daily  Albuterol inhaler As needed   Flu shot this fall  2nd Covid booster when due .  Activity as tolerated.  Oxygen 2l/m with activity as needed.  Follow up with Dr. Elsworth Soho  in 6 months and  As needed

## 2020-08-11 NOTE — Assessment & Plan Note (Signed)
Appears at baseline without exacerbation.  Continue on current regimen continue mucociliary clearance.

## 2020-08-11 NOTE — Assessment & Plan Note (Signed)
Continue on oxygen 2 L with activity as needed.  O2 saturation goals greater than 88 to 90%.
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
## 2020-08-28 DIAGNOSIS — J9611 Chronic respiratory failure with hypoxia: Secondary | ICD-10-CM | POA: Diagnosis not present

## 2020-09-03 ENCOUNTER — Other Ambulatory Visit: Payer: Self-pay | Admitting: Family Medicine

## 2020-09-03 NOTE — Telephone Encounter (Signed)
  Encourage patient to contact the pharmacy for refills or they can request refills through [facility]:  Please schedule appointment if longer than 1 year  NEXT APPOINTMENT DATE:  MEDICATION: hydrocodone  Is the patient out of medication?  yes  PHARMACY: [facility]   Let patient know to contact pharmacy at the end of the day to make sure medication is ready.  Please notify patient to allow 48-72 hours to process  CLINICAL FILLS OUT ALL BELOW:   LAST REFILL:  QTY:  REFILL DATE:    OTHER COMMENTS:    Okay for refill?  Please advise

## 2020-09-03 NOTE — Telephone Encounter (Signed)
Sent. Thanks.   

## 2020-09-07 ENCOUNTER — Emergency Department: Payer: PPO

## 2020-09-07 ENCOUNTER — Emergency Department
Admission: EM | Admit: 2020-09-07 | Discharge: 2020-09-07 | Disposition: A | Discharge status: Home / Self Care | Payer: PPO | Attending: Emergency Medicine | Admitting: Emergency Medicine

## 2020-09-07 ENCOUNTER — Telehealth: Payer: Self-pay

## 2020-09-07 ENCOUNTER — Encounter: Payer: Self-pay | Admitting: Emergency Medicine

## 2020-09-07 ENCOUNTER — Other Ambulatory Visit: Payer: Self-pay

## 2020-09-07 DIAGNOSIS — Z87891 Personal history of nicotine dependence: Secondary | ICD-10-CM | POA: Diagnosis not present

## 2020-09-07 DIAGNOSIS — I10 Essential (primary) hypertension: Secondary | ICD-10-CM | POA: Insufficient documentation

## 2020-09-07 DIAGNOSIS — I491 Atrial premature depolarization: Secondary | ICD-10-CM | POA: Diagnosis not present

## 2020-09-07 DIAGNOSIS — F0390 Unspecified dementia without behavioral disturbance: Secondary | ICD-10-CM | POA: Diagnosis not present

## 2020-09-07 DIAGNOSIS — Z20822 Contact with and (suspected) exposure to covid-19: Secondary | ICD-10-CM | POA: Diagnosis not present

## 2020-09-07 DIAGNOSIS — Z85828 Personal history of other malignant neoplasm of skin: Secondary | ICD-10-CM | POA: Insufficient documentation

## 2020-09-07 DIAGNOSIS — Z7951 Long term (current) use of inhaled steroids: Secondary | ICD-10-CM | POA: Insufficient documentation

## 2020-09-07 DIAGNOSIS — G4489 Other headache syndrome: Secondary | ICD-10-CM | POA: Diagnosis not present

## 2020-09-07 DIAGNOSIS — R5381 Other malaise: Secondary | ICD-10-CM | POA: Diagnosis not present

## 2020-09-07 DIAGNOSIS — R531 Weakness: Secondary | ICD-10-CM | POA: Diagnosis not present

## 2020-09-07 DIAGNOSIS — Z79899 Other long term (current) drug therapy: Secondary | ICD-10-CM | POA: Diagnosis not present

## 2020-09-07 DIAGNOSIS — M549 Dorsalgia, unspecified: Secondary | ICD-10-CM | POA: Diagnosis not present

## 2020-09-07 DIAGNOSIS — R5383 Other fatigue: Secondary | ICD-10-CM | POA: Diagnosis not present

## 2020-09-07 DIAGNOSIS — R519 Headache, unspecified: Secondary | ICD-10-CM | POA: Diagnosis not present

## 2020-09-07 DIAGNOSIS — R110 Nausea: Secondary | ICD-10-CM | POA: Diagnosis not present

## 2020-09-07 DIAGNOSIS — J441 Chronic obstructive pulmonary disease with (acute) exacerbation: Secondary | ICD-10-CM | POA: Insufficient documentation

## 2020-09-07 DIAGNOSIS — B349 Viral infection, unspecified: Secondary | ICD-10-CM | POA: Diagnosis not present

## 2020-09-07 DIAGNOSIS — R000 Tachycardia, unspecified: Secondary | ICD-10-CM | POA: Diagnosis not present

## 2020-09-07 DIAGNOSIS — R0602 Shortness of breath: Secondary | ICD-10-CM | POA: Diagnosis not present

## 2020-09-07 LAB — URINALYSIS, COMPLETE (UACMP) WITH MICROSCOPIC
Bacteria, UA: NONE SEEN
Bilirubin Urine: NEGATIVE
Glucose, UA: NEGATIVE mg/dL
Hgb urine dipstick: NEGATIVE
Leukocytes,Ua: NEGATIVE
Nitrite: NEGATIVE
Protein, ur: NEGATIVE mg/dL
Specific Gravity, Urine: 1.02 (ref 1.005–1.030)
pH: 8.5 — ABNORMAL HIGH (ref 5.0–8.0)

## 2020-09-07 LAB — CBC
HCT: 40.9 % (ref 36.0–46.0)
Hemoglobin: 14.2 g/dL (ref 12.0–15.0)
MCH: 30.9 pg (ref 26.0–34.0)
MCHC: 34.7 g/dL (ref 30.0–36.0)
MCV: 89.1 fL (ref 80.0–100.0)
Platelets: 295 10*3/uL (ref 150–400)
RBC: 4.59 MIL/uL (ref 3.87–5.11)
RDW: 11.7 % (ref 11.5–15.5)
WBC: 10.1 10*3/uL (ref 4.0–10.5)
nRBC: 0 % (ref 0.0–0.2)

## 2020-09-07 LAB — BASIC METABOLIC PANEL
Anion gap: 6 (ref 5–15)
BUN: 16 mg/dL (ref 8–23)
CO2: 28 mmol/L (ref 22–32)
Calcium: 9.7 mg/dL (ref 8.9–10.3)
Chloride: 103 mmol/L (ref 98–111)
Creatinine, Ser: 0.76 mg/dL (ref 0.44–1.00)
GFR, Estimated: >60 mL/min (ref >60)
Glucose, Bld: 109 mg/dL — ABNORMAL HIGH (ref 70–99)
Potassium: 4.2 mmol/L (ref 3.5–5.1)
Sodium: 137 mmol/L (ref 135–145)

## 2020-09-07 LAB — RESP PANEL BY RT-PCR (FLU A&B, COVID) ARPGX2
Influenza A by PCR: NEGATIVE
Influenza B by PCR: NEGATIVE
SARS Coronavirus 2 by RT PCR: NEGATIVE

## 2020-09-07 MED ORDER — ONDANSETRON 4 MG PO TBDP: 4.0000 mg | ORAL_TABLET | Freq: Three times a day (TID) | ORAL | 0 refills | Status: DC | PRN

## 2020-09-07 MED ORDER — SODIUM CHLORIDE 0.9 % IV BOLUS
500.0000 mL | Freq: Once | INTRAVENOUS | Status: AC
  Administered 2020-09-07: 500 mL via INTRAVENOUS

## 2020-09-07 MED ORDER — ONDANSETRON HCL 4 MG/2ML IJ SOLN
4.0000 mg | Freq: Once | INTRAMUSCULAR | Status: AC
  Administered 2020-09-07: 4 mg via INTRAVENOUS
  Filled 2020-09-07: qty 2

## 2020-09-07 NOTE — Telephone Encounter (Signed)
[place] [facility] - Client TELEPHONE ADVICE RECORD [facility] Patient Name: Megan Peterson Gender: Female DOB: May 13, 1940 Age: 80 Y 2 M 23 D Return Phone Number: [phone] (Primary), [phone] (Secondary) Address: [place] [address] Client [place] [facility] - Client Client Site [place] Physician Renford Dills - MD Contact Type Call Who Is Calling Patient / Member / Family / Caregiver Call Type Triage / Clinical Caller Name Ferman Hamming Relationship To Patient Daughter Return Phone Number [phone] (Secondary) Chief Complaint BREATHING - shortness of breath or sounds breathless Reason for Call Symptomatic / Request for Health Information Initial Comment Her mother is having shortness of breath and cant see out of her left eye,has been red past few days. Doesnt remember if she took her morning meds or not. Translation No Nurse Assessment Nurse: [place], RN, Estill Bamberg Date/Time (Eastern Time): 09/07/2020 8:50:56 AM Confirm and document reason for call. If symptomatic, describe symptoms. ---caller states her mother called her and said her eye has been red and swollen and they used eye drops this weekend and now she can not see out of it. her mom is on [facility] oxygen for COPD says mom took much hydrocodone yesterday because she didn't remember called patient. patient states she drove herself to the doctor office. she does not have her oxygen on. states she is having a hard time breathing. patient states she is in the parking lot. I advised her to go inside and tell staff she does not have her oxygen and she is having a hard time breathing. Does the patient have any new or worsening symptoms? ---Yes Will a triage be completed? ---Yes Related visit to physician within the last 2 weeks? ---No Does the PT have any chronic conditions? (i.e. diabetes, asthma, this includes High  risk factors for pregnancy, etc.) ---Yes List chronic conditions. ---dementia, copd Is this a behavioral health or substance abuse call? ---No PLEASE NOTE: All timestamps contained within this report are represented as [place] Standard Time. CONFIDENTIALTY NOTICE: This fax transmission is intended only for the addressee. It contains information that is legally privileged, confidential or otherwise protected from use or disclosure. If you are not the intended recipient, you are strictly prohibited from reviewing, disclosing, copying using or disseminating any of this information or taking any action in reliance on or regarding this information. If you have received this fax in error, please notify us immediately by telephone so that we can arrange for its return to [place]. Phone: [phone], Toll-Free: [phone], Fax: [phone] Page: 2 of 2 Call Id: [id number] Guidelines Guideline Title Affirmed Question Affirmed Notes Nurse Date/Time Eilene Ghazi Time) COPD Oxygen Monitoring and Hypoxia [1] MODERATE difficulty breathing (e.g., speaks in phrases, SOB even at rest) AND [2] worse than normal Humfleet, RN, Estill Bamberg 09/07/2020 8:55:33 AM Disp. Time Eilene Ghazi Time) Disposition Final User 09/07/2020 8:50:09 AM Send to Urgent Queue Donato Heinz 09/07/2020 9:00:16 AM Go to ED Now (or PCP triage) Yes [place], RN, Shelly Coss Disagree/Comply Comply Caller Understands Yes PreDisposition InappropriateToAsk Care Advice Given Per Guideline CARE ADVICE given per COPD Oxygen Monitoring and Hypoxia (Adult) guideline. GO TO ED NOW (OR PCP TRIAGE): * IF NO PCP (PRIMARY CARE PROVIDER) SECOND-LEVEL TRIAGE: You need to be seen within the next hour. Go to the ED/ UCC at _____________ [place] as soon as you can. Comments User: Rozelle Logan, RN Date/Time Eilene Ghazi Time): 09/07/2020 9:01:22 AM called back line to make them aware patient  is there without oxygen and having a hard time breathing. staff  stated the patient made it inside and was checking in. Referrals REFERRED TO PCP OFFICE

## 2020-09-07 NOTE — Telephone Encounter (Signed)
[place] [facility] - Client TELEPHONE ADVICE RECORD [facility] Patient Name: Megan Peterson Gender: Female DOB: May 14, 1940 Age: 80 Y 2 M 22 D Return Phone Number: [phone] (Primary), [phone] (Secondary) Address: [place] [address] Client [name] - Client Client Site [place] Physician Renford Dills - MD Contact Type Call Who Is Calling Patient / Member / Family / Caregiver Call Type Triage / Clinical Caller Name Jackquline Bosch Relationship To Patient Daughter Return Phone Number [phone] (Secondary) Chief Complaint OVERDOSE took too much medication at once Reason for Call Symptomatic / Request for [name] states her mother has taken too many hydrocodone. Translation No Nurse Assessment Nurse: Terence Lux, RN, Lana Date/Time (Eastern Time): 09/06/2020 6:13:37 PM Confirm and document reason for call. If symptomatic, describe symptoms. ---Caller states her mother has taken 2 hydrocodone pills instead of 1. Dose 5/325. Happens about 30 min ago. Takes it for back and leg pain Does the patient have any new or worsening symptoms? ---Yes Will a triage be completed? ---Yes Related visit to physician within the last 2 weeks? ---No Does the PT have any chronic conditions? (i.e. diabetes, asthma, this includes High risk factors for pregnancy, etc.) ---Yes List chronic conditions. ---neuropathy, Is this a behavioral health or substance abuse call? ---No Guidelines Guideline Title Affirmed Question Affirmed Notes Nurse Date/Time (Eastern Time) Poisoning Triager unable to answer question Lewie Chamber, [place] 09/06/2020 6:22:17 PM Disp. Time Eilene Ghazi Time) Disposition Final User 09/06/2020 6:11:47 PM Send to Urgent Queue Dineen Kid 09/06/2020 6:25:58 PM Call [place] Now Yes Terence Lux, RN, Lana PLEASE NOTE: All timestamps contained  within this report are represented as [place] Standard Time. CONFIDENTIALTY NOTICE: This fax transmission is intended only for the addressee. It contains information that is legally privileged, confidential or otherwise protected from use or disclosure. If you are not the intended recipient, you are strictly prohibited from reviewing, disclosing, copying using or disseminating any of this information or taking any action in reliance on or regarding this information. If you have received this fax in error, please notify us immediately by telephone so that we can arrange for its return to [place]. Phone: [phone], Toll-Free: [phone], Fax: [phone] Page: 2 of 2 Call Id: [id number] [place] Disagree/Comply Comply Caller Understands Yes PreDisposition Did not know what to do Care Advice Given Per Guideline [place]: * You need to call the [facility] now. [facility] advice is a free service. [place] NUMBER: [place]: [phone] * This number will automatically connect you with your local poison center. CARE ADVICE given per Poisoning (Adult) guideline. Comments User: [place] Nation, RN Date/Time Eilene Ghazi Time): 09/06/2020 6:23:27 PM on O2 at home for COPD. '@2'$  L User: [place] Nation, RN Date/Time Eilene Ghazi Time): 09/06/2020 6:26:18 PM Patient denies any symptoms now.
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
## 2020-09-07 NOTE — ED Notes (Addendum)
Pt provided warm blankets x2 and 2L [place] placed per request.  Pt was 97% on RA.  Pt reports she is "sometimes" on oxygen at home. Primary RN notified.

## 2020-09-07 NOTE — Telephone Encounter (Signed)
Agree.  Thanks.  Will await ER notes.

## 2020-09-07 NOTE — ED Triage Notes (Signed)
Pt from [place] via [facility] with c/o headache and weakness since this weekend. She has Dementia and lives at home and still drives. She was at her PMD for the weakness and headache and they called EMS to bring her here to be evaluated.

## 2020-09-07 NOTE — ED Notes (Signed)
Pt sitting in chair. Pt stating that it is more comfortable. Denies dizziness. Pt on monitor and non-skid socks on pts feet. Daughter in room with pt. Warm blankets placed back on pt.

## 2020-09-07 NOTE — Telephone Encounter (Signed)
Pt walked in to [facility] by herself; pt drove her car to [facility]; pt said lt eye blurred and could not see out of lt eye on 09/06/20. Pt could see 2 fingers with lt eye but pt said was still blurred. Pt complaining with SOB and pt did not bring her O2. Per access nurse note pt on 2L O2 prn at home. Started pt on canula with O2 2L and pt said she used a mask; switched cannula to mask.pulse ox 94% with 2 L oxygen; recked pulse ox later in visit 99% with 2 L oxygen. T 978 P 99  BP 150/90 LA reg cuff sitting. NO CP, no weakness in arms or legs; pt having problems thinking before starting O2 asked pt if anyone was with her and she said she drove, I asked pt address and how far it was from office; pt could not remember. After Oxygen started pt could not remember if took her meds this morning, could not remember if she ate breakfast or not and pt said her daughter was coming to office but did not know where her daughter lived or how long it might take her to come. Dr Damita Dunnings was not at office yet and I spoke with Dr Darnell Level who said with pt memory issues, vision changes and SOB should go to ED for eval and should go by 911. Pts daughter arrived and she agreed to go to [facility] ED and pt could go by EMS. Pt developed a h/a and began to feel generalized weakness upon arrival of EMS. Dr Damita Dunnings was made aware of pt and he also thought pt should go to ED by EMS. Pt was transported by EMS to [facility] ED. Sending note to Dr Damita Dunnings and Dr Darnell Level.

## 2020-09-16 ENCOUNTER — Encounter: Payer: Self-pay | Admitting: Neurology

## 2020-09-28 DIAGNOSIS — J9611 Chronic respiratory failure with hypoxia: Secondary | ICD-10-CM | POA: Diagnosis not present

## 2020-10-02 ENCOUNTER — Other Ambulatory Visit: Payer: Self-pay | Admitting: Family Medicine

## 2020-10-04 NOTE — Telephone Encounter (Signed)
Refill request for Hydrocodone-acetaminophen 5-325 mg tablets  LOV - 06/24/20 Next OV - 10/28/20 Last refill - 09/03/20 #120/0  Patient also needs Anoro ellipta refilled.

## 2020-10-05 ENCOUNTER — Telehealth: Payer: Self-pay

## 2020-10-05 NOTE — Telephone Encounter (Signed)
Sent. Thanks.   

## 2020-10-05 NOTE — Chronic Care Management (AMB) (Addendum)
Chronic Care Management Pharmacy Assistant   Name: Megan Peterson  MRN: [id number] DOB: 14-Feb-1940   Reason for Encounter: General Adherence   Recent office visits:  9/66/22-Family Medicine-Patient presented to office complaining of shortness of breath,blurred vision.Vital signs taken,daughter called and the patient was transported by EMS to [facility] ED.  Recent consult visits:  08/11/20-Pulmonology-Patient presented for follow up COPD.Continue oxygen at home 2L as needed, O2 saturation goal 88-90%,get flu shot and covid boosters when due.Follow up 6 months  Hospital visits:  09/07/20-[facility] ED-Patient presented for headache, shortness of breath and weakness.Labs ordered (abnormal glucose, ph, ketones)EKG, CT-Head,chest xray, no acute abnormality,IV sodium chloride bolus and 4mg  zofran.no admission  Medications: Outpatient Encounter Medications as of 10/05/2020  Medication Sig   albuterol (VENTOLIN HFA) 108 (90 Base) MCG/ACT inhaler INHALE 1-2 PUFFS INTO THE LUNGS EVERY 6 HOURS AS NEEDED FOR WHEEZING OR SHORTNESS OF BREATH   donepezil (ARICEPT) 10 MG tablet TAKE 1 TABLET BY MOUTH EVERY NIGHT AT BEDTIME   gabapentin (NEURONTIN) 800 MG tablet TAKE 1 TABLET BY MOUTH 3 TIMES DAILY   HYDROcodone-acetaminophen (NORCO/VICODIN) 5-325 MG tablet TAKE 1 TABLET BY MOUTH EVERY 6 HOURS   Ibuprofen-diphenhydrAMINE Cit (ADVIL PM PO) Take by mouth.   metoprolol succinate (TOPROL-XL) 25 MG 24 hr tablet Take 1 tablet (25 mg total) by mouth daily.   metoprolol tartrate (LOPRESSOR) 25 MG tablet TAKE 1/2 A TABLET BY MOUTH (12.5 MG) TWICE DAILY   ondansetron (ZOFRAN ODT) 4 MG disintegrating tablet Take 1 tablet (4 mg total) by mouth every 8 (eight) hours as needed for nausea or vomiting.   OXYGEN Inhale 3.5 L into the lungs at bedtime.   predniSONE (DELTASONE) 10 MG tablet Take 2 tablets with food for 5 days, then take 1 tablet for 5 days then stop   rosuvastatin (CRESTOR) 5 MG tablet TAKE 1 TABLET BY MOUTH ONCE  DAILY AT 6PM   umeclidinium-vilanterol (ANORO ELLIPTA) 62.5-25 MCG/INH AEPB INHALE 1 PUFF INTO THE LUNGS ONCE DAILY   No facility-administered encounter medications on file as of 10/05/2020.      Contacted Megan Peterson on 10/08/20 for general disease state and medication adherence call.   Patient is not > 5 days past due for refill on the following medications per chart history:  [facility] Medications: Medication Name/mg Last Fill Days Supply Rosuvastatin 5mg   08/11/20 90   What concerns do you have about your medications? The patient reports no concerns at this time   The patient denies side effects with her medications.   How often do you forget or accidentally miss a dose? Rarely  Do you use a pillbox? Yes The patient reports she puts out mediations on Monday for the week in organizer  Are you having any problems getting your medications from your pharmacy? No  Has the cost of your medications been a concern? No  Since last visit with CPP, the following interventions have been made: the patient reports having difficulty splitting the metoprolol tartrate 25mg , so this was changed to Metoprolol Succinate ER 25mg  take 1 tablet daily .  The patient has had an ED visit since last contact. 09/07/20-[facility] ED-Patient presented for headache, shortness of breath and weakness.Labs ordered (abnormal glucose, ph, ketones)EKG, CT-Head,chest xray, no acute abnormality,IV sodium chloride bolus and 4mg  zofran.no admission  The patient reports the following problems with their health. The patient reports she has good days and bad days with her weakness.Breathing is controlled with no complaints.She has supplemental oxygen at home.  she denies  concerns or questions for Megan Peterson, Pharm. D at this time.   Counseled patient on:  [place] job taking medications, Importance of taking medication daily without missed doses, Benefits of adherence packaging or a pillbox, and Access to CCM team for any cost,  medication or pharmacy concerns.  Care Gaps: Annual wellness visit in last year? No  11/18/2018 Most Recent BP reading: 120/80  81-P 08/11/20  PCP appointment on 10/28/20  Megan Peterson, CPP notified  Megan Peterson, [place] Assistant [phone]  I have reviewed the care management and care coordination activities outlined in this encounter and I am certifying that I agree with the content of this note. No further action required.  Megan Peterson, PharmD Clinical Pharmacist [facility] at [facility] [phone]

## 2020-10-28 ENCOUNTER — Ambulatory Visit (INDEPENDENT_AMBULATORY_CARE_PROVIDER_SITE_OTHER): Payer: PPO | Admitting: Family Medicine

## 2020-10-28 ENCOUNTER — Other Ambulatory Visit: Payer: Self-pay

## 2020-10-28 ENCOUNTER — Encounter: Payer: Self-pay | Admitting: Family Medicine

## 2020-10-28 ENCOUNTER — Ambulatory Visit: Payer: PPO | Admitting: Family Medicine

## 2020-10-28 VITALS — BP 112/70 | HR 103 | Temp 97.7°F | Ht 60.0 in | Wt 133.0 lb

## 2020-10-28 DIAGNOSIS — E785 Hyperlipidemia, unspecified: Secondary | ICD-10-CM | POA: Diagnosis not present

## 2020-10-28 DIAGNOSIS — J9611 Chronic respiratory failure with hypoxia: Secondary | ICD-10-CM | POA: Diagnosis not present

## 2020-10-28 DIAGNOSIS — J432 Centrilobular emphysema: Secondary | ICD-10-CM | POA: Diagnosis not present

## 2020-10-28 DIAGNOSIS — G8929 Other chronic pain: Secondary | ICD-10-CM

## 2020-10-28 DIAGNOSIS — R413 Other amnesia: Secondary | ICD-10-CM | POA: Diagnosis not present

## 2020-10-28 DIAGNOSIS — I10 Essential (primary) hypertension: Secondary | ICD-10-CM

## 2020-10-28 DIAGNOSIS — Z23 Encounter for immunization: Secondary | ICD-10-CM | POA: Diagnosis not present

## 2020-10-28 MED ORDER — GABAPENTIN 800 MG PO TABS: 800.0000 mg | ORAL_TABLET | Freq: Three times a day (TID) | ORAL | 3 refills | Status: DC

## 2020-10-28 NOTE — Patient Instructions (Addendum)
You can call for a mammogram at [facility] at [facility].  [place]  I'll work on your pain medicine refills.   Update me as needed.  I would gradually restart donepezil, rosuvastatin, and metoprolol.    Plan on recheck in about 4 months.   Take care.  Glad to see you.
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
## 2020-11-01 MED ORDER — HYDROCODONE-ACETAMINOPHEN 5-325 MG PO TABS: 1.0000 | ORAL_TABLET | Freq: Four times a day (QID) | ORAL | 0 refills | Status: DC

## 2020-11-01 NOTE — Assessment & Plan Note (Signed)
Continue hydrocodone and gabapentin.  Not sedated.  Pain controlled as is.  Recheck periodically.

## 2020-11-01 NOTE — Assessment & Plan Note (Signed)
Discussed restarting donepezil.  Routine cautions given to patient and family.  They can update me as needed.

## 2020-11-01 NOTE — Assessment & Plan Note (Signed)
See above.  Discussed routine inhaler use, discussed daily versus symptomatic med usage.  Lungs are clear.

## 2020-11-01 NOTE — Assessment & Plan Note (Signed)
Discussed statin restart.  Routine cautions given.

## 2020-11-01 NOTE — Assessment & Plan Note (Signed)
Discussed metoprolol restart.  Routine cautions given.  Rationale for use discussed.  It may help control tachycardia.

## 2020-11-02 ENCOUNTER — Other Ambulatory Visit: Payer: Self-pay | Admitting: Family Medicine

## 2020-11-02 NOTE — Telephone Encounter (Signed)
Rx denied, prev addressed.  Thanks.

## 2020-11-02 NOTE — Telephone Encounter (Signed)
Duplicate request EMR will not let me deny

## 2020-11-28 DIAGNOSIS — J9611 Chronic respiratory failure with hypoxia: Secondary | ICD-10-CM | POA: Diagnosis not present

## 2020-12-01 ENCOUNTER — Other Ambulatory Visit: Payer: Self-pay | Admitting: Family Medicine

## 2020-12-01 NOTE — Telephone Encounter (Signed)
Please have her check with pharmacy.  Rx was already sent with prev set.  Thanks.

## 2020-12-01 NOTE — Telephone Encounter (Signed)
Refill request for Hydrocodone 3-325 mg tablet  LOV - 10/28/20 Next OV - 03/21/20 Last refill - 11/01/20 #120/0 x 3

## 2020-12-06 ENCOUNTER — Telehealth: Payer: Self-pay | Admitting: Family Medicine

## 2020-12-06 NOTE — Telephone Encounter (Signed)
Patient's daughter is positive for covid and patient was exposed to her. Patient is starting to develop the same sx the daughter has been having. Right now patient is very hoarse; no other sx yet. Daughter wanted to see if she can be seen and hopefully get the same medication she is taking. I have scheduled her a visit with Dr. Diona Browner tomorrow at 11:40 am.

## 2020-12-07 ENCOUNTER — Encounter: Payer: Self-pay | Admitting: Family Medicine

## 2020-12-07 ENCOUNTER — Ambulatory Visit: Payer: PPO | Admitting: Family Medicine

## 2020-12-07 VITALS — Ht 60.0 in

## 2020-12-07 NOTE — Progress Notes (Signed)
Spoke with patient.  She states her daughter tested positive for Covid so she has been exposed.  She is currently having no symptoms and wants to cancel phone visit.  She states she will call back if she becomes symptomatic.

## 2020-12-20 ENCOUNTER — Encounter: Payer: Self-pay | Admitting: Family Medicine

## 2020-12-20 ENCOUNTER — Ambulatory Visit: Payer: PPO | Admitting: Neurology

## 2020-12-28 DIAGNOSIS — J9611 Chronic respiratory failure with hypoxia: Secondary | ICD-10-CM | POA: Diagnosis not present
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
## 2020-12-30 ENCOUNTER — Encounter: Payer: Self-pay | Admitting: Neurology

## 2020-12-30 ENCOUNTER — Other Ambulatory Visit: Payer: Self-pay

## 2020-12-30 ENCOUNTER — Ambulatory Visit: Payer: PPO | Admitting: Neurology

## 2020-12-30 VITALS — BP 137/61 | HR 78 | Resp 20 | Ht 61.0 in | Wt 137.0 lb

## 2020-12-30 DIAGNOSIS — G309 Alzheimer's disease, unspecified: Secondary | ICD-10-CM

## 2020-12-30 DIAGNOSIS — F0280 Dementia in other diseases classified elsewhere without behavioral disturbance: Secondary | ICD-10-CM | POA: Diagnosis not present

## 2020-12-30 MED ORDER — MEMANTINE HCL 10 MG PO TABS: ORAL_TABLET | ORAL | 0 refills | Status: DC

## 2020-12-30 NOTE — Patient Instructions (Addendum)
Start memantine 10mg  tablet: Take 1/2 tablet at bedtime for one week Then 1/2 tablet twice daily for one week Then 1 tablet twice daily Continue donepezil 10mg  at bedtime Routine exercise Mental exercises (puzzles, brain teasers) Socialization Follow up with Sharene Butters, PA-C in 9 months.   Mediterranean Diet A Mediterranean diet refers to food and lifestyle choices that are based on the traditions of countries located on the [employer]. It focuses on eating more fruits, vegetables, whole grains, beans, nuts, seeds, and heart-healthy fats, and eating less dairy, meat, eggs, and processed foods with added sugar, salt, and fat. This way of eating has been shown to help prevent certain conditions and improve outcomes for people who have chronic diseases, like kidney disease and heart disease. What are tips for following this plan? Reading food labels Check the serving size of packaged foods. For foods such as rice and pasta, the serving size refers to the amount of cooked product, not dry. Check the total fat in packaged foods. Avoid foods that have saturated fat or trans fats. Check the ingredient list for added sugars, such as corn syrup. Shopping  Buy a variety of foods that offer a balanced diet, including: Fresh fruits and vegetables (produce). Grains, beans, nuts, and seeds. Some of these may be available in unpackaged forms or large amounts (in bulk). Fresh seafood. Poultry and eggs. Low-fat dairy products. Buy whole ingredients instead of prepackaged foods. Buy fresh fruits and vegetables in-season from local farmers markets. Buy plain frozen fruits and vegetables. If you do not have access to quality fresh seafood, buy precooked frozen shrimp or canned fish, such as tuna, salmon, or sardines. Stock your pantry so you always have certain foods on hand, such as olive oil, canned tuna, canned tomatoes, rice, pasta, and beans. Cooking Cook foods with extra-virgin olive oil  instead of using butter or other vegetable oils. Have meat as a side dish, and have vegetables or grains as your main dish. This means having meat in small portions or adding small amounts of meat to foods like pasta or stew. Use beans or vegetables instead of meat in common dishes like chili or lasagna. Experiment with different cooking methods. Try roasting, broiling, steaming, and sauting vegetables. Add frozen vegetables to soups, stews, pasta, or rice. Add nuts or seeds for added healthy fats and plant protein at each meal. You can add these to yogurt, salads, or vegetable dishes. Marinate fish or vegetables using olive oil, lemon juice, garlic, and fresh herbs. Meal planning Plan to eat one vegetarian meal one day each week. Try to work up to two vegetarian meals, if possible. Eat seafood two or more times a week. Have healthy snacks readily available, such as: Vegetable sticks with hummus. Greek yogurt. Fruit and nut trail mix. Eat balanced meals throughout the week. This includes: Fruit: 2-3 servings a day. Vegetables: 4-5 servings a day. Low-fat dairy: 2 servings a day. Fish, poultry, or lean meat: 1 serving a day. Beans and legumes: 2 or more servings a week. Nuts and seeds: 1-2 servings a day. Whole grains: 6-8 servings a day. Extra-virgin olive oil: 3-4 servings a day. Limit red meat and sweets to only a few servings a month. Lifestyle  Cook and eat meals together with your family, when possible. Drink enough fluid to keep your urine pale yellow. Be physically active every day. This includes: Aerobic exercise like running or swimming. Leisure activities like gardening, walking, or housework. Get 7-8 hours of sleep each night. If  recommended by your health care provider, drink red wine in moderation. This means 1 glass a day for nonpregnant women and 2 glasses a day for men. A glass of wine equals 5 oz (150 mL). What foods should I eat? Fruits Apples. Apricots. Avocado.  Berries. Bananas. Cherries. Dates. Figs. Grapes. Lemons. Melon. Oranges. Peaches. Plums. Pomegranate. Vegetables Artichokes. Beets. Broccoli. Cabbage. Carrots. Eggplant. Green beans. Chard. Kale. Spinach. Onions. Leeks. Peas. Squash. Tomatoes. Peppers. Radishes. Grains Whole-grain pasta. Brown rice. Bulgur wheat. Polenta. Couscous. Whole-wheat bread. [place]. Meats and other proteins Beans. Almonds. Sunflower seeds. Pine nuts. Peanuts. [place]. Salmon. Scallops. Shrimp. [place]. Tilapia. Clams. Oysters. Eggs. Poultry without skin. Dairy Low-fat milk. Cheese. Greek yogurt. Fats and oils Extra-virgin olive oil. Avocado oil. Grapeseed oil. Beverages Water. Red wine. Herbal tea. Sweets and desserts Greek yogurt with honey. Baked apples. Poached pears. Trail mix. Seasonings and condiments Basil. Cilantro. Coriander. Cumin. Mint. Parsley. Sage. Rosemary. Tarragon. Garlic. Oregano. Thyme. Pepper. Balsamic vinegar. Tahini. Hummus. Tomato sauce. Olives. Mushrooms. The items listed above may not be a complete list of foods and beverages you can eat. Contact a dietitian for more information. What foods should I limit? This is a list of foods that should be eaten rarely or only on special occasions. Fruits Fruit canned in syrup. Vegetables Deep-fried potatoes (french fries). Grains Prepackaged pasta or rice dishes. Prepackaged cereal with added sugar. Prepackaged snacks with added sugar. Meats and other proteins Beef. Pork. Lamb. Poultry with skin. Hot dogs. [place] Salines. Dairy Ice cream. Sour cream. Whole milk. Fats and oils Butter. Canola oil. Vegetable oil. Beef fat (tallow). Lard. Beverages Juice. Sugar-sweetened soft drinks. Beer. Liquor and spirits. Sweets and desserts Cookies. Cakes. Pies. Candy. Seasonings and condiments Mayonnaise. Pre-made sauces and marinades. The items listed above may not be a complete list of foods and beverages you should limit. Contact a dietitian for more  information. Summary The Mediterranean diet includes both food and lifestyle choices. Eat a variety of fresh fruits and vegetables, beans, nuts, seeds, and whole grains. Limit the amount of red meat and sweets that you eat. If recommended by your health care provider, drink red wine in moderation. This means 1 glass a day for nonpregnant women and 2 glasses a day for men. A glass of wine equals 5 oz (150 mL). This information is not intended to replace advice given to you by your health care provider. Make sure you discuss any questions you have with your health care provider. Document Revised: 01/24/2019 Document Reviewed: 11/21/2018 Elsevier Patient Education  2022 [employer].

## 2021-01-07 ENCOUNTER — Other Ambulatory Visit: Payer: Self-pay

## 2021-01-07 ENCOUNTER — Ambulatory Visit (INDEPENDENT_AMBULATORY_CARE_PROVIDER_SITE_OTHER): Payer: Medicare Other | Admitting: Family Medicine

## 2021-01-07 ENCOUNTER — Ambulatory Visit: Payer: Medicare Other | Admitting: Family Medicine

## 2021-01-07 ENCOUNTER — Encounter: Payer: Self-pay | Admitting: Family Medicine

## 2021-01-07 DIAGNOSIS — R21 Rash and other nonspecific skin eruption: Secondary | ICD-10-CM | POA: Diagnosis not present

## 2021-01-07 DIAGNOSIS — E785 Hyperlipidemia, unspecified: Secondary | ICD-10-CM | POA: Diagnosis not present

## 2021-01-07 DIAGNOSIS — R159 Full incontinence of feces: Secondary | ICD-10-CM

## 2021-01-07 MED ORDER — PREDNISONE 10 MG PO TABS: ORAL_TABLET | ORAL | 0 refills | Status: DC

## 2021-01-07 NOTE — Patient Instructions (Addendum)
Try prednisone with food and let me know if that isn't helping your leg and ankle.  Take care.  Glad to see you. Let me know if you have more troubles with BMs.  Pelvic floor PT may help.

## 2021-01-07 NOTE — Progress Notes (Signed)
This visit occurred during the SARS-CoV-2 public health emergency.  Safety protocols were in place, including screening questions prior to the visit, additional usage of staff PPE, and extensive cleaning of exam room while observing appropriate contact time as indicated for disinfecting solutions.  R foot and ankle swelling going on about 1 month.    B but R>L anterior shin rash.  No blistering.  Itching w/o pain.  No fevers.  No drainage.  Tried wrapping, abx cream, etc w/o relief.  Skin lesions present for months, comes and goes over a period of years.  Had seen derm clinic prev.    Urinary frequency.  No burning with urination.  No fevers.  No abd pain.  Would defer w/u given longstanding sx at baseline.    She is off statin in the meantime, she declined taking it.  No ADE on med.  Discussed.    She has had trouble with stool leakage, noted most days.  No diarrhea. Likely going on for months.  We talked about pelvic floor PT and I asked her to consider.  She did not commit at this point.  Living with her daughter now.   Discussed  Meds, vitals, and allergies reviewed.   ROS: Per HPI unless specifically indicated in ROS section   GEN: nad, alert and oriented HEENT: ncat NECK: supple w/o LA CV: rrr. PULM: ctab, no inc wob ABD: soft, +bs EXT: no edema on L but R ankle slightly puffy.  R ankle not bruise and not tender to palpation. SKIN: no acute rash but chronicly irritated lesions on the R>L shins anterior.

## 2021-01-09 DIAGNOSIS — R159 Full incontinence of feces: Secondary | ICD-10-CM | POA: Insufficient documentation

## 2021-01-09 HISTORY — DX: Full incontinence of feces: R15.9

## 2021-01-09 NOTE — Assessment & Plan Note (Signed)
Along with urinary frequency.  Discussed pelvic floor PT and she will consider in the meantime.  This is not an acute issue with sudden neurologic compromise.

## 2021-01-09 NOTE — Assessment & Plan Note (Signed)
It sounds like she has had a waxing and waning itchy rash over the course of years.  Unclear source.  She had dermatology evaluation previously.  None of the lesions appear infected.  Reasonable to try prednisone taper with routine cautions and she can update me as needed.  I also asked her to notice if she saw any change in the right ankle swelling while on prednisone.

## 2021-01-09 NOTE — Assessment & Plan Note (Signed)
Declined statin treatment at this point.

## 2021-01-26 ENCOUNTER — Other Ambulatory Visit: Payer: Self-pay | Admitting: Neurology

## 2021-01-29 DIAGNOSIS — R079 Chest pain, unspecified: Secondary | ICD-10-CM | POA: Diagnosis not present

## 2021-01-29 DIAGNOSIS — R0789 Other chest pain: Secondary | ICD-10-CM | POA: Diagnosis not present

## 2021-01-29 DIAGNOSIS — I10 Essential (primary) hypertension: Secondary | ICD-10-CM | POA: Diagnosis not present

## 2021-01-31 ENCOUNTER — Encounter: Payer: Self-pay | Admitting: Family Medicine

## 2021-02-02 ENCOUNTER — Telehealth: Payer: Self-pay | Admitting: Family Medicine

## 2021-02-02 NOTE — Telephone Encounter (Signed)
I spoke with [name] (DPR signed) [name] said that pt has been doing fine today; pt went out with [name] for short while and is now resting in her chair.Pt is eating and drinking well.[name] said so far today pt is doing good. Pt has not had CP since Sat. [name] scheduled in office appt with Dr Damita Dunnings on 02/04/21 at [place] said that pt will not see a different provider and [name] is going to tell pt it is time for her to see Dr Damita Dunnings as a FU so pt will come for appt. UC & ED precautions given and [name] voiced understanding. Sending note to Dr Damita Dunnings and Janett Billow CMA.

## 2021-02-02 NOTE — Telephone Encounter (Signed)
Patient had an episode where paramedics recently came to the house.  Please triage patient about current symptoms and see if she would consent for an office visit.  Please check with patient's family.  Thanks.

## 2021-02-02 NOTE — Telephone Encounter (Signed)
Noted. Thanks.

## 2021-02-04 ENCOUNTER — Other Ambulatory Visit: Payer: Self-pay

## 2021-02-04 ENCOUNTER — Encounter: Payer: Self-pay | Admitting: Family Medicine

## 2021-02-04 ENCOUNTER — Ambulatory Visit (INDEPENDENT_AMBULATORY_CARE_PROVIDER_SITE_OTHER): Payer: Medicare Other | Admitting: Family Medicine

## 2021-02-04 VITALS — BP 124/76 | HR 64 | Temp 97.6°F | Ht 61.0 in | Wt 142.0 lb

## 2021-02-04 DIAGNOSIS — R079 Chest pain, unspecified: Secondary | ICD-10-CM

## 2021-02-04 DIAGNOSIS — E875 Hyperkalemia: Secondary | ICD-10-CM

## 2021-02-04 DIAGNOSIS — R21 Rash and other nonspecific skin eruption: Secondary | ICD-10-CM | POA: Diagnosis not present

## 2021-02-04 LAB — COMPREHENSIVE METABOLIC PANEL
ALT: 9 U/L (ref 0–35)
AST: 18 U/L (ref 0–37)
Albumin: 4.2 g/dL (ref 3.5–5.2)
Alkaline Phosphatase: 105 U/L (ref 39–117)
BUN: 21 mg/dL (ref 6–23)
CO2: 34 mEq/L — ABNORMAL HIGH (ref 19–32)
Calcium: 9.4 mg/dL (ref 8.4–10.5)
Chloride: 104 mEq/L (ref 96–112)
Creatinine, Ser: 1.21 mg/dL — ABNORMAL HIGH (ref 0.40–1.20)
GFR: 42.59 mL/min — ABNORMAL LOW (ref >60.00)
Glucose, Bld: 87 mg/dL (ref 70–99)
Potassium: 5.8 mEq/L — ABNORMAL HIGH (ref 3.5–5.1)
Sodium: 139 mEq/L (ref 135–145)
Total Bilirubin: 0.4 mg/dL (ref 0.2–1.2)
Total Protein: 6.8 g/dL (ref 6.0–8.3)

## 2021-02-04 LAB — CBC WITH DIFFERENTIAL/PLATELET
Basophils Absolute: 0.1 10*3/uL (ref 0.0–0.1)
Basophils Relative: 1.3 % (ref 0.0–3.0)
Eosinophils Absolute: 0.1 10*3/uL (ref 0.0–0.7)
Eosinophils Relative: 1.4 % (ref 0.0–5.0)
HCT: 38.2 % (ref 36.0–46.0)
Hemoglobin: 12.4 g/dL (ref 12.0–15.0)
Lymphocytes Relative: 25.2 % (ref 12.0–46.0)
Lymphs Abs: 2.1 10*3/uL (ref 0.7–4.0)
MCHC: 32.3 g/dL (ref 30.0–36.0)
MCV: 93.4 fl (ref 78.0–100.0)
Monocytes Absolute: 0.7 10*3/uL (ref 0.1–1.0)
Monocytes Relative: 8.5 % (ref 3.0–12.0)
Neutro Abs: 5.4 10*3/uL (ref 1.4–7.7)
Neutrophils Relative %: 63.6 % (ref 43.0–77.0)
Platelets: 294 10*3/uL (ref 150.0–400.0)
RBC: 4.09 Mil/uL (ref 3.87–5.11)
RDW: 13.3 % (ref 11.5–15.5)
WBC: 8.4 10*3/uL (ref 4.0–10.5)

## 2021-02-04 LAB — LIPID PANEL
Cholesterol: 266 mg/dL — ABNORMAL HIGH (ref 0–200)
HDL: 52.7 mg/dL (ref >39.00)
LDL Cholesterol: 175 mg/dL — ABNORMAL HIGH (ref 0–99)
NonHDL: 212.88
Total CHOL/HDL Ratio: 5
Triglycerides: 188 mg/dL — ABNORMAL HIGH (ref 0.0–149.0)
VLDL: 37.6 mg/dL (ref 0.0–40.0)

## 2021-02-04 LAB — TSH: TSH: 2.66 u[IU]/mL (ref 0.35–5.50)

## 2021-02-04 MED ORDER — NITROGLYCERIN 0.4 MG SL SUBL: 0.4000 mg | SUBLINGUAL_TABLET | SUBLINGUAL | 3 refills | Status: DC | PRN

## 2021-02-04 MED ORDER — TRIAMCINOLONE ACETONIDE 0.5 % EX CREA: 1.0000 "application " | TOPICAL_CREAM | Freq: Two times a day (BID) | CUTANEOUS | 1 refills | Status: DC

## 2021-02-04 MED ORDER — SODIUM ZIRCONIUM CYCLOSILICATE 10 G PO PACK: 10.0000 g | PACK | Freq: Every day | ORAL | 0 refills | Status: DC

## 2021-02-04 NOTE — Patient Instructions (Signed)
Use the cream on the rash.   We'll call about seeing cardiology.  If you have chest pain then use the nitroglycerin.   Go to the lab on the way out.   If you have mychart we'll likely use that to update you.    Take care.  Glad to see you.

## 2021-02-04 NOTE — Progress Notes (Signed)
This visit occurred during the SARS-CoV-2 public health emergency.  Safety protocols were in place, including screening questions prior to the visit, additional usage of staff PPE, and extensive cleaning of exam room while observing appropriate contact time as indicated for disinfecting solutions.  Here today with daughter.  Recently EMS eval re: CP.  EKG done at that time without acute changes.  No pain since the event.  She had an event about 2 weeks prior to recent EMS eval and that was similar.    On the day of EMS eval, she had taken a bath, gone to her room and then went to the kitchen.  Daughter saw her lean forward and put her hand to her chest.  Reported acute onset chest pain.  Had R > L jaw pain. EMS called.  EKG w/o acute changes.  By the time EMS arrived, pt had taken 4 of the 81mg  aspirin and sx were improving.  She declined ER eval/EMS transport.  This was on 01/29/21.    Done with prednisone.  Rash and swelling improved on prednisone but returned in the meantime.    She had elected to stop crestor.  Still using inhalers and pain medicine at baseline.    Meds, vitals, and allergies reviewed.   ROS: Per HPI unless specifically indicated in ROS section   GEN: nad, alert and oriented HEENT:ncat NECK: supple w/o LA CV: rrr.  PULM: ctab, no inc wob ABD: soft, +bs EXT: no edema SKIN: R>L shin chronic rash and local edema noted  32 minutes were devoted to patient care in this encounter (this includes time spent reviewing the patient's file/history, interviewing and examining the patient, counseling/reviewing plan with patient).

## 2021-02-07 NOTE — Assessment & Plan Note (Signed)
EKG without acute changes done by EMS and no symptoms since the event.  She had declined to take her statin previously.  See notes on labs.  Prescription given for nitroglycerin to use as needed.  Routine nitroglycerin cautions given.  Continue metoprolol.  Routine chest pain cautions given to patient and daughter.  Refer to cardiology.  I need cardiology input on CT scoring versus pharmacologic stress test.  I do not think the patient would be able to perform a treadmill test.

## 2021-02-07 NOTE — Assessment & Plan Note (Signed)
Previously improved on oral prednisone.  Will change to triamcinolone cream 0.5% and she can let me know if that is not helping.

## 2021-02-10 ENCOUNTER — Other Ambulatory Visit: Payer: Self-pay

## 2021-02-10 ENCOUNTER — Other Ambulatory Visit: Payer: Medicare Other

## 2021-02-10 ENCOUNTER — Telehealth: Payer: Self-pay

## 2021-02-10 NOTE — Chronic Care Management (AMB) (Addendum)
Chronic Care Management Pharmacy Assistant   Name Megan Peterson  MRN: [id number] DOB: October 30, 1940   Reason for Encounter: General Adherence  Recent office visits:  02/04/21-PCP-Patient presented for follow up chest pain-Labs ordered (Please call patient's daughter- potassium is elevated,would take sodium zirconium cyclosilicate 80D daily on today and tomorrow.  Rx sent.  Would need recheck labs next week.  Order placed.  Please schedule.  Keep drinking plenty of water in the meantime. Other labs are okay except for lipids elevated and that was expected off statin.  Would restart statin if patient consents) She had elected to stop crestor-chronic rash-Previously improved on oral prednisone.  Will change to triamcinolone cream 0.5% and she can let me know if that is not helping. Prescription given for nitroglycerin to use as needed. Referral for cardiology 01/07/21-PCP-Patient presented for a rash and foot/ankle swelling.Consider pelvic floor therapy,try prednisone taper 10mg  take 2 a day for 5 days, then 1 a day for 5 days with food. 10/28/20-PCP-Patient presented for follow up-Discussed restarting donepezil.Discussed metoprolol restart,Discussed statin restart-schedule mammogram, follow up 4 months  Recent consult visits:  12/30/20-Neurology-Patient presented for follow up Alzheimer's disease.Start memantine titrating to 10mg  twice daily,discussed [facility] visits:  None in previous 6 months  Medications: Outpatient Encounter Medications as of 02/10/2021  Medication Sig   albuterol (VENTOLIN HFA) 108 (90 Base) MCG/ACT inhaler INHALE 1-2 PUFFS INTO THE LUNGS EVERY 6 HOURS AS NEEDED FOR WHEEZING OR SHORTNESS OF BREATH   donepezil (ARICEPT) 10 MG tablet TAKE 1 TABLET BY MOUTH EVERY NIGHT AT BEDTIME   gabapentin (NEURONTIN) 800 MG tablet Take 1 tablet (800 mg total) by mouth 3 (three) times daily.   HYDROcodone-acetaminophen (NORCO/VICODIN) 5-325 MG tablet Take 1 tablet by mouth  every 6 (six) hours.   Ibuprofen-diphenhydrAMINE Cit (ADVIL PM PO) Take by mouth.   memantine (NAMENDA) 10 MG tablet 1 TABLET BY MOUTH TWICEDAILY THEREAFTER.   metoprolol succinate (TOPROL-XL) 25 MG 24 hr tablet Take 1 tablet (25 mg total) by mouth daily.   nitroGLYCERIN (NITROSTAT) 0.4 MG SL tablet Place 1 tablet (0.4 mg total) under the tongue every 5 (five) minutes as needed for chest pain (max 3 doses in 15 minutes.  if still having chest pain then go to the ER).   ondansetron (ZOFRAN ODT) 4 MG disintegrating tablet Take 1 tablet (4 mg total) by mouth every 8 (eight) hours as needed for nausea or vomiting.   OXYGEN Inhale 3.5 L into the lungs at bedtime.   sodium zirconium cyclosilicate (LOKELMA) 10 g PACK packet Take 10 g by mouth daily. For 2 days   triamcinolone cream (KENALOG) 0.5 % Apply 1 application topically 2 (two) times daily. Put on rash on the legs twice a day   umeclidinium-vilanterol (ANORO ELLIPTA) 62.5-25 MCG/INH AEPB INHALE 1 PUFF INTO THE LUNGS ONCE DAILY   No facility-administered encounter medications on file as of 02/10/2021.      Contacted Megan Peterson on 02/11/21 for general disease state and medication adherence call. Spoke with daughter Megan Peterson, patient has dementia.  Patient is not more than 5 days past due for refill on the following medications per chart history:  Star Medications: Medication Name/mg Last Fill Days Supply No star medications identified  Recent refills: Metoprolol succinate 25mg  01/26/21 90 Nitroglycerin 0.4mg   02/04/21  25    What concerns do you have about your medications? None  The patient denies side effects with their medications.   How often do you forget  or accidentally miss a dose? Never  Daughter states she dispenses medications appropriately.  Do you use a pillbox? Yes Daughter puts medications in the pillbox for daily administration.  Are you having any problems getting your medications from your pharmacy? The daughter Megan Peterson  has moved [place] into her home to live and care for, she is in process of moving medications to pharmacy closer to her home.  Has the cost of your medications been a concern? No  Since last visit with CPP, the following interventions have been made. [name] restarted metoprolol 25mg  take 1 tablet daily. The daughter has BP monitor. Patient declines statin medication.  The patient has not had an ED visit since last contact.   The patient denies problems with their health.   Patient denies concerns or questions for [name], PharmD at this time.   Counseled patient on:  Spoke with daughter Megan Peterson Importance of taking medication daily without missed doses, Benefits of adherence packaging or a pillbox, and Access to CCM team for any cost, medication or pharmacy concerns.   Care Gaps: Annual wellness visit in last year? No Most Recent BP reading:124/76  64-P  02/04/21  Upcoming appointments: PCP appointment on 03/21/21 and Cardiology appointment on 02/18/21  [name], CPP notified  [name], [place] Assistant [phone]  I have reviewed the care management and care coordination activities outlined in this encounter and I am certifying that I agree with the content of this note. No further action required.  [name], PharmD Clinical Pharmacist [facility] at [facility] [phone]
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
## 2021-02-18 ENCOUNTER — Other Ambulatory Visit: Payer: Self-pay

## 2021-02-18 ENCOUNTER — Ambulatory Visit: Payer: Medicare Other | Admitting: Cardiovascular Disease

## 2021-02-18 ENCOUNTER — Encounter: Payer: Self-pay | Admitting: Cardiovascular Disease

## 2021-02-18 VITALS — BP 116/74 | HR 63 | Resp 20 | Ht 59.0 in | Wt 139.8 lb

## 2021-02-18 DIAGNOSIS — R0602 Shortness of breath: Secondary | ICD-10-CM | POA: Diagnosis not present

## 2021-02-18 DIAGNOSIS — R072 Precordial pain: Secondary | ICD-10-CM | POA: Diagnosis not present

## 2021-02-18 LAB — BASIC METABOLIC PANEL
BUN/Creatinine Ratio: 18 (ref 12–28)
BUN: 21 mg/dL (ref 8–27)
CO2: 25 mmol/L (ref 20–29)
Calcium: 9 mg/dL (ref 8.7–10.3)
Chloride: 102 mmol/L (ref 96–106)
Creatinine, Ser: 1.14 mg/dL — ABNORMAL HIGH (ref 0.57–1.00)
Glucose: 84 mg/dL (ref 70–99)
Potassium: 5.1 mmol/L (ref 3.5–5.2)
Sodium: 138 mmol/L (ref 134–144)
eGFR: 49 mL/min/{1.73_m2} — ABNORMAL LOW (ref >59)

## 2021-02-18 NOTE — Patient Instructions (Addendum)
Medication Instructions:  Take the Metoprolol on your list two hours before the scan.   *If you need a refill on your cardiac medications before your next appointment, please call your pharmacy*   Lab Work: BMET today   If you have labs (blood work) drawn today and your tests are completely normal, you will receive your results only by: [place] (if you have MyChart) OR A paper copy in the mail If you have any lab test that is abnormal or we need to change your treatment, we will call you to review the results.   Testing/Procedures: Your physician has requested that you have cardiac CT. Cardiac computed tomography (CT) is a painless test that uses an x-ray machine to take clear, detailed pictures of your heart. For further information please visit [email or web address]. Please follow instruction sheet as given.  Echocardiogram - Your physician has requested that you have an echocardiogram. Echocardiography is a painless test that uses sound waves to create images of your heart. It provides your doctor with information about the size and shape of your heart and how well your hearts chambers and valves are working. This procedure takes approximately one hour. There are no restrictions for this procedure. This will be performed at either our [facility] location - [address] [place] location [facility] 2nd floor.    Follow-Up: At [facility], you and your health needs are our priority.  As part of our continuing mission to provide you with exceptional heart care, we have created designated Provider Care Teams.  These Care Teams include your primary Cardiologist (physician) and Advanced Practice Providers (APPs -  Physician Assistants and Nurse Practitioners) who all work together to provide you with the care you need, when you need it.  We recommend signing up for the patient portal called "MyChart".  Sign up information is provided on this After  Visit Summary.  MyChart is used to connect with patients for Virtual Visits (Telemedicine).  Patients are able to view lab/test results, encounter notes, upcoming appointments, etc.  Non-urgent messages can be sent to your provider as well.   To learn more about what you can do with MyChart, go to [email or web address].    Your next appointment:   As needed  The format for your next appointment:   In Person  Provider:   Eleonore Chiquito, MD    Other Instructions   Your cardiac CT will be scheduled at one of the below locations:   [facility] [address] [place], [place] [address] [phone]  If scheduled at [facility], please arrive at the [facility] main entrance (entrance A) of [facility] 30 minutes prior to test start time. You can use the FREE valet parking offered at the main entrance (encouraged to control the heart rate for the test) Proceed to the [facility] Radiology Department (first floor) to check-in and test prep.   Please follow these instructions carefully (unless otherwise directed):   On the Night Before the Test: Be sure to Drink plenty of water. Do not consume any caffeinated/decaffeinated beverages or chocolate 12 hours prior to your test. Do not take any antihistamines 12 hours prior to your test.  On the Day of the Test: Drink plenty of water until 1 hour prior to the test. Do not eat any food 4 hours prior to the test. You may take your regular medications prior to the test.  Take metoprolol two hours prior to  test. HOLD Furosemide/Hydrochlorothiazide morning of the test. FEMALES- please wear underwire-free bra if available, avoid dresses & tight clothing      After the Test: Drink plenty of water. After receiving IV contrast, you may experience a mild flushed feeling. This is normal. On occasion, you may experience a mild rash up to 24 hours after the test. This is not dangerous. If this occurs, you can take  Benadryl 25 mg and increase your fluid intake. If you experience trouble breathing, this can be serious. If it is severe call 911 IMMEDIATELY. If it is mild, please call our office. If you take any of these medications: Glipizide/Metformin, Avandament, Glucavance, please do not take 48 hours after completing test unless otherwise instructed.  We will call to schedule your test 2-4 weeks out understanding that some insurance companies will need an authorization prior to the service being performed.   For non-scheduling related questions, please contact the cardiac imaging nurse navigator should you have any questions/concerns: Marchia Bond, Cardiac Imaging Nurse Navigator Gordy Clement, Cardiac Imaging Nurse Navigator  Heart and Vascular Services Direct Office Dial: [phone]   For scheduling needs, including cancellations and rescheduling, please call [place], [phone].

## 2021-03-02 ENCOUNTER — Encounter (HOSPITAL_COMMUNITY): Payer: Self-pay

## 2021-03-02 ENCOUNTER — Other Ambulatory Visit: Payer: Self-pay

## 2021-03-02 ENCOUNTER — Telehealth (HOSPITAL_COMMUNITY): Payer: Self-pay | Admitting: Emergency Medicine

## 2021-03-02 ENCOUNTER — Other Ambulatory Visit: Payer: Self-pay | Admitting: Family Medicine

## 2021-03-02 ENCOUNTER — Ambulatory Visit (HOSPITAL_BASED_OUTPATIENT_CLINIC_OR_DEPARTMENT_OTHER): Payer: Medicare Other

## 2021-03-02 DIAGNOSIS — R072 Precordial pain: Secondary | ICD-10-CM | POA: Diagnosis not present

## 2021-03-02 DIAGNOSIS — R0602 Shortness of breath: Secondary | ICD-10-CM

## 2021-03-02 LAB — ECHOCARDIOGRAM COMPLETE
Area-P 1/2: 4.04 cm2
S' Lateral: 2.5 cm

## 2021-03-02 MED ORDER — HYDROCODONE-ACETAMINOPHEN 5-325 MG PO TABS: 1.0000 | ORAL_TABLET | Freq: Four times a day (QID) | ORAL | 0 refills | Status: DC

## 2021-03-02 NOTE — Telephone Encounter (Signed)
Reaching out to patient to offer assistance regarding upcoming cardiac imaging study; pt verbalizes understanding of appt date/time, parking situation and where to check in, pre-test NPO status and medications ordered, and verified current allergies; name and call back number provided for further questions should they arise ?Marchia Bond RN Navigator Cardiac Imaging ?[facility] ?[phone] office ?[phone] cell ? ?Denies iv issues ?Spoke with daughter ?Daily meds  ?Arrival 1230 ?

## 2021-03-02 NOTE — Telephone Encounter (Signed)
Refill request for HYDROCODONE-APAP 5-325 MG TAB ? ?LOV - 02/04/21 ?Next OV - 03/21/21 ?Last refill - 11/01/20 #120/0 ? ?

## 2021-03-03 ENCOUNTER — Ambulatory Visit (HOSPITAL_COMMUNITY)
Admission: RE | Admit: 2021-03-03 | Discharge: 2021-03-03 | Disposition: A | Discharge status: Home / Self Care | Payer: Medicare Other | Source: Ambulatory Visit | Attending: Cardiovascular Disease | Admitting: Cardiovascular Disease

## 2021-03-03 ENCOUNTER — Other Ambulatory Visit: Payer: Self-pay | Admitting: Cardiology

## 2021-03-03 DIAGNOSIS — R072 Precordial pain: Secondary | ICD-10-CM | POA: Insufficient documentation

## 2021-03-03 DIAGNOSIS — R0602 Shortness of breath: Secondary | ICD-10-CM | POA: Diagnosis not present

## 2021-03-03 DIAGNOSIS — R931 Abnormal findings on diagnostic imaging of heart and coronary circulation: Secondary | ICD-10-CM | POA: Diagnosis not present

## 2021-03-03 DIAGNOSIS — I2510 Atherosclerotic heart disease of native coronary artery without angina pectoris: Secondary | ICD-10-CM | POA: Diagnosis not present

## 2021-03-03 MED ORDER — NITROGLYCERIN 0.4 MG SL SUBL: 0.8000 mg | SUBLINGUAL_TABLET | Freq: Once | SUBLINGUAL | Status: AC

## 2021-03-03 MED ORDER — METOPROLOL TARTRATE 5 MG/5ML IV SOLN
5.0000 mg | INTRAVENOUS | Status: DC | PRN
  Administered 2021-03-03: 5 mg via INTRAVENOUS

## 2021-03-03 MED ORDER — METOPROLOL TARTRATE 5 MG/5ML IV SOLN
INTRAVENOUS | Status: AC
  Administered 2021-03-03: 10 mg via INTRAVENOUS
  Filled 2021-03-03: qty 10

## 2021-03-03 MED ORDER — METOPROLOL TARTRATE 5 MG/5ML IV SOLN
INTRAVENOUS | Status: AC
  Filled 2021-03-03: qty 5

## 2021-03-03 MED ORDER — NITROGLYCERIN 0.4 MG SL SUBL
SUBLINGUAL_TABLET | SUBLINGUAL | Status: AC
  Administered 2021-03-03: 0.8 mg via SUBLINGUAL
  Filled 2021-03-03: qty 2

## 2021-03-03 MED ORDER — IOHEXOL 350 MG/ML SOLN
95.0000 mL | Freq: Once | INTRAVENOUS | Status: AC | PRN
  Administered 2021-03-03: 95 mL via INTRAVENOUS

## 2021-03-04 ENCOUNTER — Ambulatory Visit (HOSPITAL_COMMUNITY)
Admission: RE | Admit: 2021-03-04 | Discharge: 2021-03-04 | Disposition: A | Discharge status: Home / Self Care | Payer: Medicare Other | Source: Ambulatory Visit | Attending: Cardiology | Admitting: Cardiology

## 2021-03-04 DIAGNOSIS — R931 Abnormal findings on diagnostic imaging of heart and coronary circulation: Secondary | ICD-10-CM | POA: Insufficient documentation

## 2021-03-07 ENCOUNTER — Other Ambulatory Visit: Payer: Self-pay | Admitting: *Deleted

## 2021-03-07 MED ORDER — ATORVASTATIN CALCIUM 20 MG PO TABS: 20.0000 mg | ORAL_TABLET | Freq: Every day | ORAL | 3 refills | Status: DC

## 2021-03-21 ENCOUNTER — Telehealth (INDEPENDENT_AMBULATORY_CARE_PROVIDER_SITE_OTHER): Payer: Medicare Other | Admitting: Family Medicine

## 2021-03-21 ENCOUNTER — Other Ambulatory Visit: Payer: Self-pay

## 2021-03-21 ENCOUNTER — Encounter: Payer: Self-pay | Admitting: Family Medicine

## 2021-03-21 DIAGNOSIS — R0600 Dyspnea, unspecified: Secondary | ICD-10-CM

## 2021-03-21 DIAGNOSIS — E785 Hyperlipidemia, unspecified: Secondary | ICD-10-CM

## 2021-03-21 MED ORDER — FUROSEMIDE 20 MG PO TABS: 20.0000 mg | ORAL_TABLET | Freq: Every day | ORAL | 0 refills | Status: DC | PRN

## 2021-03-21 NOTE — Progress Notes (Signed)
Virtual visit completed through WebEx or similar program ?Patient location: home  ?Provider location: Financial controller at [facility], office  ?Participants: Patient and me (unless stated otherwise below) ? ?Pandemic considerations d/w pt.  ? ?Limitations and rationale for visit method d/w patient.  Patient agreed to proceed.  ? ?CC: dyspnea ? ?HPI: SOB with exertion. Still on O2 at baseline, 2L, [place].   ? ?We talked about aches on statin.  It may be reasonable to hold lipitor for about 10 days.  She can update me at that point.   ? ?D/w pt about inhaler use.  She is still taking anoro qAM.  Not taking albuterol every day.  Some wheeze.  No sputum.  Some cough, occ.  She has some BLE edema, more than usual.  She is still sleeping flat at night.  SOB worse in the last 3-4 weeks.  Weight is increased from 120s over the past few months to 140 lbs per patient report.   ? ?No chest pain.  Not on prednisone currently.  Pulse ox 97% ? ?Meds and allergies reviewed.  ? ?ROS: Per HPI unless specifically indicated in ROS section  ? ?NAD ?Speech wnl, speaks in complete sentences and does not appear short of breath. ?Puffy ankles on exam.   ? ?A/P: ? ?Dyspnea.  At this point still okay for outpatient follow-up.  We talked about options.  She did notice peripheral edema with puffy ankles.  She is not hypoxic.  Her weight is up some.  Discussed options reasonable for trial of lasix for a few days and see if SOBS and edema improve.  Then she/family can update me.  ? ?We talked about aches on statin.  It may be reasonable to hold lipitor for about 10 days.  She can update me at that point.   ?

## 2021-03-23 DIAGNOSIS — R0600 Dyspnea, unspecified: Secondary | ICD-10-CM | POA: Insufficient documentation

## 2021-03-23 DIAGNOSIS — R0609 Other forms of dyspnea: Secondary | ICD-10-CM

## 2021-03-23 HISTORY — DX: Other forms of dyspnea: R06.09

## 2021-03-23 NOTE — Assessment & Plan Note (Signed)
Dyspnea.  At this point still okay for outpatient follow-up.  We talked about options.  She did notice peripheral edema with puffy ankles.  She is not hypoxic.  Her weight is up some.  Discussed options reasonable for trial of lasix for a few days and see if SOBS and edema improve.  Then she/family can update me.  ?

## 2021-03-23 NOTE — Assessment & Plan Note (Signed)
We talked about aches on statin.  It may be reasonable to hold lipitor for about 10 days.  She can update me at that point.   ?

## 2021-03-28 DIAGNOSIS — J9611 Chronic respiratory failure with hypoxia: Secondary | ICD-10-CM | POA: Diagnosis not present

## 2021-03-29 ENCOUNTER — Telehealth: Payer: Self-pay | Admitting: Family Medicine

## 2021-03-29 NOTE — Telephone Encounter (Signed)
Called pt to schedule AWV with [facility], but no answer. Will try back at a later time.  ?

## 2021-04-01 ENCOUNTER — Encounter: Payer: Self-pay | Admitting: Family Medicine

## 2021-04-03 ENCOUNTER — Other Ambulatory Visit: Payer: Self-pay | Admitting: Family Medicine

## 2021-04-03 ENCOUNTER — Encounter: Payer: Self-pay | Admitting: Family Medicine

## 2021-04-03 MED ORDER — HYDROCODONE-ACETAMINOPHEN 5-325 MG PO TABS: 1.0000 | ORAL_TABLET | Freq: Four times a day (QID) | ORAL | 0 refills | Status: DC

## 2021-04-06 ENCOUNTER — Ambulatory Visit: Payer: Medicare Other

## 2021-04-28 DIAGNOSIS — J9611 Chronic respiratory failure with hypoxia: Secondary | ICD-10-CM | POA: Diagnosis not present

## 2021-05-10 ENCOUNTER — Other Ambulatory Visit: Payer: Self-pay | Admitting: Family Medicine

## 2021-05-11 ENCOUNTER — Encounter: Payer: Self-pay | Admitting: Pulmonary Disease

## 2021-05-11 ENCOUNTER — Ambulatory Visit: Payer: Medicare Other | Admitting: Pulmonary Disease

## 2021-05-11 VITALS — BP 128/74 | HR 92 | Ht 59.0 in | Wt 146.2 lb

## 2021-05-11 DIAGNOSIS — J432 Centrilobular emphysema: Secondary | ICD-10-CM | POA: Diagnosis not present

## 2021-05-11 DIAGNOSIS — J9611 Chronic respiratory failure with hypoxia: Secondary | ICD-10-CM | POA: Diagnosis not present

## 2021-05-11 NOTE — Assessment & Plan Note (Signed)
Unfortunately her COPD management is complicated by her worsening dementia which is causing noncompliance. ?We will provide her with nebulizer and albuterol nebs to use in an emergency. ?[name] will try to ensure that she takes the Anoro on a daily basis and albuterol as needed for rescue ?

## 2021-05-11 NOTE — Patient Instructions (Signed)
?  X RX to [facility] for POC ? ?X RX for nebuliser x 1  ?Albuterol nebs every 6h prn  # 30 ? ?Outpatient palliative care consultation ?

## 2021-05-11 NOTE — Assessment & Plan Note (Signed)
She does qualify for portable oxygen and hopefully will be more compliant with using this. ?We will provide her with a POC which will hopefully make it easier for her to use while ambulating ?

## 2021-05-11 NOTE — Progress Notes (Signed)
? ?  Subjective:  ? ? Patient ID: Megan Peterson, female    DOB: Sep 28, 1940, 81 y.o.   MRN: [id number] ? ?HPI ? ?81 yo ex-smoker FU  for COPD, bronchiectasis and chronic respiratory failure on oxygen ?  ?PMH - chronic pain, alzheimer's ? ?Accompanied by daughter Megan Peterson today.  She brings her portable oxygen with her but is not using it, saturation 93% on arrival.  She does not offer any complaints. ?Megan Peterson feels that her breathing is getting worse, memory issues are definitely worse. ?She uses oxygen only as needed. ?She takes the Anoro frequently, uses albuterol on an as-needed basis. ?Breathing was bad a couple of weeks ago but has now improved ? ? ?Significant tests/ events reviewed ? ?08/11/2019-overnight oximetry on room air-duration of sleep 6 hours and 57 minutes, time spent below 88% 5 hours and 25 minutes, average SPO2 87, SpO2 low 61 >>start 2L  ?  ?ONO on 2L 08/27/2019-duration of sleep 7 hours and 57 minutes, time spent below 88% 6 hours and 19 minutes, baseline oxygen level 87%, SpO2 low 69% >> increase to 4L during sleep ?  ?PFT  06/2016 that showed similar lung function (compared 2013) with moderate to severe airflow obstruction with an FEV1 at 55%, ratio 52, positive bronchodilator response, FVC 80%, DLCO 56%. ?  ?CT angiogram 04/21/16 was negative for pulmonary embolism but showed new bilateral upper lobe groundglass infiltrates with mild bronchiectasis in both lower lobes ? ? ? ?Review of Systems ?neg for any significant sore throat, dysphagia, itching, sneezing, nasal congestion or excess/ purulent secretions, fever, chills, sweats, unintended wt loss, pleuritic or exertional cp, hempoptysis, orthopnea pnd or change in chronic leg swelling. Also denies presyncope, palpitations, heartburn, abdominal pain, nausea, vomiting, diarrhea or change in bowel or urinary habits, dysuria,hematuria, rash, arthralgias, visual complaints, headache, numbness weakness or ataxia. ? ? ?   ?Objective:  ? Physical Exam ? ?Gen.  Pleasant, elderly,well-nourished, in no distress ?ENT - no thrush, no pallor/icterus,no post nasal drip ?Neck: No JVD, no thyromegaly, no carotid bruits ?Lungs: no use of accessory muscles, no dullness to percussion, clear without rales or rhonchi  ?Cardiovascular: Rhythm regular, heart sounds  normal, no murmurs or gallops, no peripheral edema ?Musculoskeletal: No deformities, no cyanosis or clubbing  ? ? ? ?   ?Assessment & Plan:  ? ? ?

## 2021-05-26 ENCOUNTER — Encounter: Payer: Self-pay | Admitting: Pulmonary Disease

## 2021-05-26 DIAGNOSIS — R413 Other amnesia: Secondary | ICD-10-CM

## 2021-05-28 DIAGNOSIS — J9611 Chronic respiratory failure with hypoxia: Secondary | ICD-10-CM | POA: Diagnosis not present

## 2021-05-31 ENCOUNTER — Telehealth: Payer: Self-pay

## 2021-05-31 NOTE — Telephone Encounter (Signed)
Spoke with patient's daughter [name] and scheduled a Mychart Palliative Consult for 06/06/21 @ 1 PM.   Consent obtained; updated Netsmart, Team List and Epic.

## 2021-05-31 NOTE — Telephone Encounter (Signed)
Need to call [employer] on 5/31. [facility] has access to pull orders from Epic so faxing is unnecessary.

## 2021-06-06 ENCOUNTER — Telehealth: Payer: PPO | Admitting: Internal Medicine

## 2021-06-13 ENCOUNTER — Telehealth: Payer: Self-pay | Admitting: Pulmonary Disease

## 2021-06-13 ENCOUNTER — Telehealth: Payer: PPO | Admitting: Internal Medicine

## 2021-06-13 DIAGNOSIS — J9611 Chronic respiratory failure with hypoxia: Secondary | ICD-10-CM | POA: Diagnosis not present

## 2021-06-13 DIAGNOSIS — J432 Centrilobular emphysema: Secondary | ICD-10-CM | POA: Diagnosis not present

## 2021-06-13 DIAGNOSIS — F321 Major depressive disorder, single episode, moderate: Secondary | ICD-10-CM

## 2021-06-13 DIAGNOSIS — R413 Other amnesia: Secondary | ICD-10-CM | POA: Diagnosis not present

## 2021-06-13 DIAGNOSIS — M5440 Lumbago with sciatica, unspecified side: Secondary | ICD-10-CM | POA: Diagnosis not present

## 2021-06-13 DIAGNOSIS — Z515 Encounter for palliative care: Secondary | ICD-10-CM | POA: Diagnosis not present

## 2021-06-13 DIAGNOSIS — G8929 Other chronic pain: Secondary | ICD-10-CM | POA: Diagnosis not present

## 2021-06-13 MED ORDER — MIRTAZAPINE 7.5 MG PO TABS: 7.5000 mg | ORAL_TABLET | Freq: Every day | ORAL | 3 refills | Status: DC

## 2021-06-13 NOTE — Telephone Encounter (Signed)
Patient's daughter called and states that the oxygen order that was suppose to be sent to [facility] was sent to [place] (according to [facility]). Patient's daughter, [name] would like the order sent to [place] Fax number: [phone].  [name] states this has been going on since 5/26 and is getting frustrated and would like to get this figured out.   Please advise.

## 2021-06-13 NOTE — Telephone Encounter (Signed)
Called and spoke with Danielle at [employer] to ask her if she received the order for Neb and POC last month. She states yes and that the POC one was sent to her manager to him to make the decision on. She said she is going to reach out to him for an update and will send me a message once she hears something.   ATC [name] daughter to let her know above information, LMTCB

## 2021-06-13 NOTE — Progress Notes (Signed)
[facility] Consult Note Telephone: [phone]  Fax: [phone]   Date of encounter: 06/13/21 1:01 PM PATIENT NAME: Megan Peterson [place] [id number]   [phone] (home)  DOB: 1940/05/26 MRN: [id number] PRIMARY CARE PROVIDER:    Tonia Ghent, MD,  [place] [address] [phone]  REFERRING PROVIDER:   Tonia Ghent, MD [address] [place],  [place] [address] [phone]  RESPONSIBLE PARTY:    Contact Information     Name Relation Home Work Mobile   Megan Peterson Daughter   [phone]        Due to the COVID-19 crisis, this visit was done via telemedicine from my office and it was initiated and consent by this patient and or family.  I connected with  Megan Peterson OR PROXY on 06/13/21 by a video enabled telemedicine application and verified that I am speaking with the correct person using two identifiers.   I discussed the limitations of evaluation and management by telemedicine. The patient expressed understanding and agreed to proceed.  Palliative Care was asked to follow this patient by consultation request of  Megan Ghent, MD to address advance care planning and complex medical decision making. This is the initial visit.                                     ASSESSMENT AND PLAN / RECOMMENDATIONS:   Advance Care Planning/Goals of Care: Goals include to maximize quality of life and symptom management. Patient/health care surrogate gave his/her permission to discuss.Our advance care planning conversation included a discussion about:    The value and importance of advance care planning  Experiences with loved ones who have been seriously ill or have died  Exploration of personal, cultural or spiritual beliefs that might influence medical decisions  Exploration of goals of care in the event of a sudden injury or illness  Identification  of a healthcare  agent--have HCPOA--Megan Peterson is it.  Has living will.   Review and updating or creation of an  advance directive document . Decision not to resuscitate or to de-escalate disease focused treatments due to poor prognosis. CODE STATUS:  they need to discuss CPR more for the future.  Initiated discussion, but patient was not up for it. She has a big bday next week they'll go out for.  They are planning on going to the beach the coming weeks.  She says what will she do?   She wants to remain in her daughter's home through end of life.    Symptom Management/Plan: 1. Depression, major, single episode, moderate ([place]) -one goal is for her to sleep less in the day and seem to enjoy life more so will attempt nightly remeron--may need to go up quickly on dose if makes too sleepy as that is less of an issue at higher doses  - mirtazapine (REMERON) 7.5 MG tablet; Take 1 tablet (7.5 mg total) by mouth at bedtime.  Dispense: 90 tablet; Refill: 3  2. Chronic respiratory failure with hypoxia (HCC) -continue 2L O2 via [place]  3. Centrilobular emphysema ([place]) -I see that getting her nebulizer is in the works already through pulmonary and they're aware that Rx went to the wrong location -continue ongoing therapies per pulmonary otherwise -appears to be a big contributor as well to overall decline; however, she still has a  good appetite--has actually lost 6 lbs since may though  4. Chronic midline low back pain with sciatica, sciatica laterality unspecified -reports this is not bothersome so continue gabapentin, norco when severe, could also use tylenol as long as don't exceed 3g per day  5. Memory loss -likely some underlying dementia; however, depression certainly seems to be affecting motivation--typical geriatric presentation of depression with apathy and malaise -challenges with taking meds -cont in-home support and will follow to provide education along the way  6. Palliative care by specialist -introduced  palliative care -initiated goals of care, started MOST discussion which we can do in future--need to delve into this more at next appt  Follow up Palliative Care Visit: Palliative care will continue to follow for complex medical decision making, advance care planning, and clarification of goals. Return 8/3 at 10am.   This visit was coded based on medical decision making (MDM). 20 mins on ACP.  PPS: 50%  HOSPICE ELIGIBILITY/DIAGNOSIS: Nearing/COPD potentially  Chief Complaint: initial palliative video visit  HISTORY OF PRESENT ILLNESS:  Megan Peterson is a 81 y.o. 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   Says she's not doing too good.  She has no energy.  She had a spell of lightheadedness.  Megan Peterson checked her oxygen POX 96% and HR 69.  On 2L via [place].  COPD--has had for a lot of years.  At least 5, maybe 8 yrs.  She is sob walking across the room.  Taking a shower weekly/otherwise birdbath and getting ready makes her winded, too.  Appetite is very good.     She's bothered by not being able to get around.  No assistive devices needed except have wheelchair when goes out.  She gets out 2-3 x per week to shop and go to meals.     Sometimes uses albuterol 0-2x per day.  Also on anoro.  They're struggling to get a nebulizer from [facility] that was ordered by pulmonologist.  She had pulmonary rehab before.    She has some dementia, as well.  Megan Peterson says memory loss probably going on three years in September.    Uses gabapentin and norco tid.  She says she "ain't in pain".    Sleeps well at night--uses recliner for rest also.  Did that since back surgery years ago.    No longer having any chest pains or needed ntg.    If she is having a good day, Megan Peterson may go out and do  her own thing.  She has a camera where she can see her and she keeps a phone on the bedside table in case.  On bad days, she stays.  Her son may also be with her some on the weekend.  Megan Peterson reminds her to take her meds but she may still not do it.    Moves bowels ok--some incontinence occasionally.  Doesn't make it in time after holds too long.    She's negative about everything.  Pt's son took her out to eat and she made a negative face about the food before even eating it.    History obtained from review of EMR, discussion with primary team, and interview with family, facility staff/caregiver and/or Ms. Marlou Sa.   I reviewed available labs, medications, imaging, studies and related documents from  the EMR.  Records reviewed and summarized above.   ROS Review of Systems  Constitutional:  Positive for activity change and fatigue. Negative for appetite change, chills and fever.  HENT:  Negative for congestion.   Eyes:  Negative for visual disturbance.  Respiratory:  Positive for shortness of breath and wheezing. Negative for chest tightness.   Cardiovascular:  Negative for leg swelling.  Gastrointestinal:  Negative for constipation.       Some incontinence  Genitourinary:  Negative for dysuria.       Some incontinence  Musculoskeletal:  Positive for arthralgias, back pain and gait problem.  Skin:  Positive for pallor.  Neurological:  Positive for dizziness, weakness and numbness.       Lightheadedness  Psychiatric/Behavioral:  Positive for confusion and dysphoric mood. Negative for sleep disturbance. The patient is not nervous/anxious.        Memory loss    Physical Exam: There were no vitals filed for this visit. There is no height or weight on file to calculate BMI. Wt Readings from Last 500 Encounters:  05/11/21 146 lb 3.2 oz (66.3 kg)  03/21/21 142 lb (64.4 kg)  02/18/21 139 lb 12.8 oz (63.4 kg)  02/04/21 142 lb (64.4 kg)  01/07/21 138 lb (62.6 kg)  12/30/20 137 lb (62.1 kg)   10/28/20 133 lb (60.3 kg)  09/07/20 145 lb 15.1 oz (66.2 kg)  08/11/20 133 lb 3.2 oz (60.4 kg)  06/24/20 132 lb (59.9 kg)  03/29/20 134 lb (60.8 kg)  03/17/20 132 lb 6.4 oz (60.1 kg)  12/16/19 135 lb 6.4 oz (61.4 kg)  11/04/19 132 lb 9.6 oz (60.1 kg)  10/02/19 134 lb 7 oz (61 kg)  09/18/19 133 lb (60.3 kg)  07/29/19 131 lb 9.6 oz (59.7 kg)  04/22/19 130 lb 4 oz (59.1 kg)  02/27/19 127 lb 2 oz (57.7 kg)  02/04/19 130 lb (59 kg)  01/09/19 130 lb 1 oz (59 kg)  11/19/18 128 lb 9 oz (58.3 kg)  10/03/18 124 lb 8 oz (56.5 kg)  05/17/18 123 lb 7 oz (56 kg)  04/02/18 123 lb 9 oz (56 kg)  02/26/18 122 lb 8 oz (55.6 kg)  01/04/18 123 lb 8 oz (56 kg)  11/16/17 124 lb 8 oz (56.5 kg)  11/06/17 124 lb 12 oz (56.6 kg)  08/02/17 122 lb 12 oz (55.7 kg)  06/21/17 125 lb 4 oz (56.8 kg)  04/13/17 123 lb (55.8 kg)  03/20/17 130 lb 8 oz (59.2 kg)  12/18/16 128 lb 4 oz (58.2 kg)  11/13/16 125 lb (56.7 kg)  10/24/16 126 lb 4 oz (57.3 kg)  10/18/16 126 lb (57.2 kg)  10/17/16 126 lb (57.2 kg)  08/22/16 125 lb 14.1 oz (57.1 kg)  08/17/16 127 lb 3.3 oz (57.7 kg)  08/15/16 127 lb 3.3 oz (57.7 kg)  08/10/16 126 lb 15.8 oz (57.6 kg)  07/28/16 128 lb (58.1 kg)  07/25/16 127 lb (57.6 kg)  07/25/16 127 lb 6.8 oz (57.8 kg)  07/20/16 125 lb 14.1 oz (57.1 kg)  07/18/16 127 lb 3.3 oz (57.7 kg)  07/14/16 125 lb 12.8 oz (57.1 kg)  07/10/16 126 lb (57.2 kg)  07/06/16 125 lb 3.5 oz (56.8 kg)  07/04/16 127 lb 6.8 oz (57.8 kg)  06/29/16 127 lb 10.3 oz (57.9 kg)  06/27/16 126 lb 12.2 oz (57.5 kg)  06/22/16 127 lb 13.9 oz (58 kg)  06/20/16 127 lb 13.9 oz (58 kg)  06/15/16 127 lb 3.3 oz (57.7  kg)  06/13/16 126 lb 12.2 oz (57.5 kg)  06/08/16 127 lb 6.8 oz (57.8 kg)  06/06/16 127 lb 10.3 oz (57.9 kg)  06/01/16 128 lb 1.4 oz (58.1 kg)  05/30/16 127 lb 3.3 oz (57.7 kg)  05/25/16 125 lb 14.1 oz (57.1 kg)  05/23/16 126 lb 12.2 oz (57.5 kg)  05/18/16 124 lb 12.5 oz (56.6 kg)  05/16/16 125 lb 7.1 oz (56.9 kg)   05/08/16 126 lb 8.7 oz (57.4 kg)  05/01/16 125 lb 8 oz (56.9 kg)  04/25/16 123 lb (55.8 kg)  04/21/16 129 lb 14.4 oz (58.9 kg)  04/11/16 123 lb 8 oz (56 kg)  04/11/16 123 lb 8 oz (56 kg)  03/27/16 122 lb 8 oz (55.6 kg)  03/23/16 119 lb 1.6 oz (54 kg)  09/27/15 120 lb 8 oz (54.7 kg)  02/25/15 121 lb 8 oz (55.1 kg)  04/20/14 119 lb (54 kg)  03/13/14 117 lb 4 oz (53.2 kg)  02/02/14 119 lb 8 oz (54.2 kg)  12/12/13 121 lb 8 oz (55.1 kg)  10/15/13 126 lb 8 oz (57.4 kg)  06/10/13 127 lb 8 oz (57.8 kg)  03/11/13 126 lb 4 oz (57.3 kg)  03/19/12 126 lb 6.4 oz (57.3 kg)  09/06/11 122 lb (55.3 kg)  06/05/11 124 lb (56.2 kg)  06/27/10 130 lb 8 oz (59.2 kg)  06/18/06 123 lb 2.1 oz (55.9 kg)   Physical Exam Constitutional:      Appearance: Normal appearance. She is obese.  HENT:     Head: Normocephalic and atraumatic.  Pulmonary:     Effort: Pulmonary effort is normal.     Comments: Sitting in chair Musculoskeletal:        General: Normal range of motion.  Skin:    Coloration: Skin is pale.  Neurological:     General: No focal deficit present.     Motor: Weakness present.     Comments: Oriented to person, place  Psychiatric:     Comments: Flat affect, negative or sarcastic responses to questions     CURRENT PROBLEM LIST:  Patient Active Problem List   Diagnosis Date Noted   Dyspnea 03/23/2021   Fecal incontinence 01/09/2021   Medication management 07/29/2019   Joint pain 04/23/2019   Memory loss 10/06/2018   Neck pain 04/02/2018   Rash 11/18/2017   Bursitis 08/03/2017   Chronic back pain 12/19/2016   Health care maintenance 10/18/2016   Hypertension 10/18/2016   TIA (transient ischemic attack) 10/18/2016   Radicular leg pain 07/30/2016   Left knee pain 07/10/2016   Tachycardia 05/01/2016   Chronic respiratory failure with hypoxia ([place]) 04/21/2016   COPD with acute exacerbation ([place]) 04/21/2016   Bronchiectasis ([place])    Encounter for chronic pain management  01/18/2016   Vitamin D deficiency 02/25/2015   Osteopenia 01/02/2014   Medicare annual wellness visit, subsequent 12/12/2013   Advanced care planning/counseling discussion 12/12/2013   Right carotid bruit 12/12/2013   Generalized anxiety disorder    COPD (chronic obstructive pulmonary disease) with emphysema ([place]) 06/05/2011   Chest pain 05/16/2007   HLD (hyperlipidemia) 09/26/2006   PAST MEDICAL HISTORY:  Active Ambulatory Problems    Diagnosis Date Noted   HLD (hyperlipidemia) 09/26/2006   Chest pain 05/16/2007   COPD (chronic obstructive pulmonary disease) with emphysema ([place]) 06/05/2011   Generalized anxiety disorder    Medicare annual wellness visit, subsequent 12/12/2013   Advanced care planning/counseling discussion 12/12/2013   Right carotid bruit 12/12/2013   Osteopenia 01/02/2014  Vitamin D deficiency 02/25/2015   Encounter for chronic pain management 01/18/2016   Bronchiectasis ([place])    Chronic respiratory failure with hypoxia ([place]) 04/21/2016   COPD with acute exacerbation ([place]) 04/21/2016   Tachycardia 05/01/2016   Left knee pain 07/10/2016   Radicular leg pain 07/30/2016   Health care maintenance 10/18/2016   Hypertension 10/18/2016   TIA (transient ischemic attack) 10/18/2016   Chronic back pain 12/19/2016   Bursitis 08/03/2017   Rash 11/18/2017   Neck pain 04/02/2018   Memory loss 10/06/2018   Joint pain 04/23/2019   Medication management 07/29/2019   Fecal incontinence 01/09/2021   Dyspnea 03/23/2021   Resolved Ambulatory Problems    Diagnosis Date Noted   Chronic lower back pain 09/26/2006   TOBACCO ABUSE, HX OF 02/15/2009   HYSTERECTOMY, HX OF 09/26/2006   Routine health maintenance 06/06/2011   Ex-smoker    Abnormal drug screen 10/02/2013   Acute respiratory failure ([place]) 03/19/2016   Influenza 03/19/2016   Sepsis ([place]) 04/21/2016   HCAP (healthcare-associated pneumonia) 04/21/2016   Pain medication agreement signed 12/19/2016   Past  Medical History:  Diagnosis Date   Anxiety disorder 1997   Back pain, chronic    COPD (chronic obstructive pulmonary disease) ([place])    Detached retina, left Aug '12   Dislocated shoulder april '12   Hyperlipidemia    Squamous cell skin cancer, chin Spring '12   SOCIAL HX:  Social History   Tobacco Use   Smoking status: Former    Years: 40.00    Types: Cigarettes    Quit date: 01/03/1991    Years since quitting: 30.4   Smokeless tobacco: Never  Substance Use Topics   Alcohol use: No   FAMILY HX:  Family History  Problem Relation Age of Onset   Coronary artery disease Mother 12       massive (smoker)   Coronary artery disease Maternal Grandmother        grandmother   Head & neck cancer Maternal Grandmother        mouth   COPD Father    Alcohol abuse Father    Breast cancer Maternal Aunt    Heart disease Sister        pacer   Pulmonary embolism Sister    Diabetes Paternal Grandfather    Breast cancer Other    Stroke Other    Memory loss Maternal Uncle        unsure if diagnosis of dementia   Colon cancer Neg Hx       ALLERGIES:  Allergies  Allergen Reactions   Buspar [Buspirone] Other (See Comments)    Dizzy and nauseated.   Cymbalta [Duloxetine Hcl] Other (See Comments)    fatigue   Lexapro [Escitalopram Oxalate] Other (See Comments)    Per patient, intolerant   Meperidine Hcl Other (See Comments)    unknown   Morphine And Related Other (See Comments)    Vomiting    Statins Other (See Comments)    Myalgias on statins      PERTINENT MEDICATIONS:  Outpatient Encounter Medications as of 06/13/2021  Medication Sig   albuterol (VENTOLIN HFA) 108 (90 Base) MCG/ACT inhaler INHALE 1-2 PUFFS INTO THE LUNGS EVERY 6 HOURS AS NEEDED FOR WHEEZING OR SHORTNESS OF BREATH   donepezil (ARICEPT) 10 MG tablet TAKE 1 TABLET BY MOUTH EVERY NIGHT AT BEDTIME (Patient not taking: Reported on 05/11/2021)   furosemide (LASIX) 20 MG tablet Take 1 tablet (20 mg total) by mouth daily  as needed for fluid (taking in the AM.).   gabapentin (NEURONTIN) 800 MG tablet Take 1 tablet (800 mg total) by mouth 3 (three) times daily. (Patient not taking: Reported on 05/11/2021)   HYDROcodone-acetaminophen (NORCO/VICODIN) 5-325 MG tablet Take 1 tablet by mouth every 6 (six) hours.   Ibuprofen-diphenhydrAMINE Cit (ADVIL PM PO) Take by mouth.   memantine (NAMENDA) 10 MG tablet 1 TABLET BY MOUTH TWICEDAILY THEREAFTER.   metoprolol succinate (TOPROL-XL) 25 MG 24 hr tablet Take 1 tablet (25 mg total) by mouth daily.   nitroGLYCERIN (NITROSTAT) 0.4 MG SL tablet Place 1 tablet (0.4 mg total) under the tongue every 5 (five) minutes as needed for chest pain (max 3 doses in 15 minutes.  if still having chest pain then go to the ER). (Patient not taking: Reported on 05/11/2021)   ondansetron (ZOFRAN ODT) 4 MG disintegrating tablet Take 1 tablet (4 mg total) by mouth every 8 (eight) hours as needed for nausea or vomiting. (Patient not taking: Reported on 05/11/2021)   OXYGEN Inhale 3.5 L into the lungs at bedtime.   rosuvastatin (CRESTOR) 5 MG tablet TAKE 1 TABLET BY MOUTH ONCE DAILY AT 6PM   triamcinolone cream (KENALOG) 0.5 % Apply 1 application topically 2 (two) times daily. Put on rash on the legs twice a day   umeclidinium-vilanterol (ANORO ELLIPTA) 62.5-25 MCG/INH AEPB INHALE 1 PUFF INTO THE LUNGS ONCE DAILY   No facility-administered encounter medications on file as of 06/13/2021.    Thank you for the opportunity to participate in the care of Ms. Aerts.  The palliative care team will continue to follow. Please call our office at [phone] if we can be of additional assistance.   Hollace Kinnier, DO  COVID-19 PATIENT SCREENING TOOL Asked and negative response unless otherwise noted:  Have you had symptoms of covid, tested positive or been in contact with someone with symptoms/positive test in the past 5-10 days?  NO

## 2021-06-14 ENCOUNTER — Telehealth: Payer: Self-pay | Admitting: Family Medicine

## 2021-06-14 NOTE — Telephone Encounter (Signed)
Left message for patient to call back and schedule Medicare Annual Wellness Visit (AWV) either virtually or phone   Last AWV 11/18/18 ; please schedule at anytime with health coach    I left my direct # [phone]

## 2021-06-16 ENCOUNTER — Ambulatory Visit (INDEPENDENT_AMBULATORY_CARE_PROVIDER_SITE_OTHER): Payer: Medicare Other

## 2021-06-16 VITALS — Ht <= 58 in | Wt 140.0 lb

## 2021-06-16 DIAGNOSIS — Z0000 Encounter for general adult medical examination without abnormal findings: Secondary | ICD-10-CM | POA: Diagnosis not present
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
## 2021-06-16 NOTE — Patient Instructions (Signed)
Megan Peterson , Thank you for taking time to come for your Medicare Wellness Visit. I appreciate your ongoing commitment to your health goals. Please review the following plan we discussed and let me know if I can assist you in the future.   Screening recommendations/referrals: Colonoscopy: not required Mammogram: not required Bone Density: completed 01/21/2018 Recommended yearly ophthalmology/optometry visit for glaucoma screening and checkup Recommended yearly dental visit for hygiene and checkup  Vaccinations: Influenza vaccine: due 08/02/2021 Pneumococcal vaccine: completed 03/11/2013 Tdap vaccine: due Shingles vaccine: discussed   Covid-19: 01/09/2020, 03/11/2019, 02/16/2019  Advanced directives: Please bring a copy of your POA (Power of Attorney) and/or Living Will to your next appointment.   Conditions/risks identified: none  Next appointment: Follow up in one year for your annual wellness visit    Preventive Care 65 Years and Older, Female Preventive care refers to lifestyle choices and visits with your health care provider that can promote health and wellness. What does preventive care include? A yearly physical exam. This is also called an annual well check. Dental exams once or twice a year. Routine eye exams. Ask your health care provider how often you should have your eyes checked. Personal lifestyle choices, including: Daily care of your teeth and gums. Regular physical activity. Eating a healthy diet. Avoiding tobacco and drug use. Limiting alcohol use. Practicing safe sex. Taking low-dose aspirin every day. Taking vitamin and mineral supplements as recommended by your health care provider. What happens during an annual well check? The services and screenings done by your health care provider during your annual well check will depend on your age, overall health, lifestyle risk factors, and family history of disease. Counseling  Your health care provider may ask you  questions about your: Alcohol use. Tobacco use. Drug use. Emotional well-being. Home and relationship well-being. Sexual activity. Eating habits. History of falls. Memory and ability to understand (cognition). Work and work Statistician. Reproductive health. Screening  You may have the following tests or measurements: Height, weight, and BMI. Blood pressure. Lipid and cholesterol levels. These may be checked every 5 years, or more frequently if you are over 80 years old. Skin check. Lung cancer screening. You may have this screening every year starting at age 80 if you have a 30-pack-year history of smoking and currently smoke or have quit within the past 15 years. Fecal occult blood test (FOBT) of the stool. You may have this test every year starting at age 80. Flexible sigmoidoscopy or colonoscopy. You may have a sigmoidoscopy every 5 years or a colonoscopy every 10 years starting at age 80. Hepatitis C blood test. Hepatitis B blood test. Sexually transmitted disease (STD) testing. Diabetes screening. This is done by checking your blood sugar (glucose) after you have not eaten for a while (fasting). You may have this done every 1-3 years. Bone density scan. This is done to screen for osteoporosis. You may have this done starting at age 80. Mammogram. This may be done every 1-2 years. Talk to your health care provider about how often you should have regular mammograms. Talk with your health care provider about your test results, treatment options, and if necessary, the need for more tests. Vaccines  Your health care provider may recommend certain vaccines, such as: Influenza vaccine. This is recommended every year. Tetanus, diphtheria, and acellular pertussis (Tdap, Td) vaccine. You may need a Td booster every 10 years. Zoster vaccine. You may need this after age 80. Pneumococcal 13-valent conjugate (PCV13) vaccine. One dose is recommended after  age 80. Pneumococcal polysaccharide  (PPSV23) vaccine. One dose is recommended after age 80. Talk to your health care provider about which screenings and vaccines you need and how often you need them. This information is not intended to replace advice given to you by your health care provider. Make sure you discuss any questions you have with your health care provider. Document Released: 01/15/2015 Document Revised: 09/08/2015 Document Reviewed: 10/20/2014 Elsevier Interactive Patient Education  2017 [place] Prevention in the Home Falls can cause injuries. They can happen to people of all ages. There are many things you can do to make your home safe and to help prevent falls. What can I do on the outside of my home? Regularly fix the edges of walkways and driveways and fix any cracks. Remove anything that might make you trip as you walk through a door, such as a raised step or threshold. Trim any bushes or trees on the path to your home. Use bright outdoor lighting. Clear any walking paths of anything that might make someone trip, such as rocks or tools. Regularly check to see if handrails are loose or broken. Make sure that both sides of any steps have handrails. Any raised decks and porches should have guardrails on the edges. Have any leaves, snow, or ice cleared regularly. Use sand or salt on walking paths during winter. Clean up any spills in your garage right away. This includes oil or grease spills. What can I do in the bathroom? Use night lights. Install grab bars by the toilet and in the tub and shower. Do not use towel bars as grab bars. Use non-skid mats or decals in the tub or shower. If you need to sit down in the shower, use a plastic, non-slip stool. Keep the floor dry. Clean up any water that spills on the floor as soon as it happens. Remove soap buildup in the tub or shower regularly. Attach bath mats securely with double-sided non-slip rug tape. Do not have throw rugs and other things on the  floor that can make you trip. What can I do in the bedroom? Use night lights. Make sure that you have a light by your bed that is easy to reach. Do not use any sheets or blankets that are too big for your bed. They should not hang down onto the floor. Have a firm chair that has side arms. You can use this for support while you get dressed. Do not have throw rugs and other things on the floor that can make you trip. What can I do in the kitchen? Clean up any spills right away. Avoid walking on wet floors. Keep items that you use a lot in easy-to-reach places. If you need to reach something above you, use a strong step stool that has a grab bar. Keep electrical cords out of the way. Do not use floor polish or wax that makes floors slippery. If you must use wax, use non-skid floor wax. Do not have throw rugs and other things on the floor that can make you trip. What can I do with my stairs? Do not leave any items on the stairs. Make sure that there are handrails on both sides of the stairs and use them. Fix handrails that are broken or loose. Make sure that handrails are as long as the stairways. Check any carpeting to make sure that it is firmly attached to the stairs. Fix any carpet that is loose or worn. Avoid having throw rugs  at the top or bottom of the stairs. If you do have throw rugs, attach them to the floor with carpet tape. Make sure that you have a light switch at the top of the stairs and the bottom of the stairs. If you do not have them, ask someone to add them for you. What else can I do to help prevent falls? Wear shoes that: Do not have high heels. Have rubber bottoms. Are comfortable and fit you well. Are closed at the toe. Do not wear sandals. If you use a stepladder: Make sure that it is fully opened. Do not climb a closed stepladder. Make sure that both sides of the stepladder are locked into place. Ask someone to hold it for you, if possible. Clearly mark and make  sure that you can see: Any grab bars or handrails. First and last steps. Where the edge of each step is. Use tools that help you move around (mobility aids) if they are needed. These include: Canes. Walkers. Scooters. Crutches. Turn on the lights when you go into a dark area. Replace any light bulbs as soon as they burn out. Set up your furniture so you have a clear path. Avoid moving your furniture around. If any of your floors are uneven, fix them. If there are any pets around you, be aware of where they are. Review your medicines with your doctor. Some medicines can make you feel dizzy. This can increase your chance of falling. Ask your doctor what other things that you can do to help prevent falls. This information is not intended to replace advice given to you by your health care provider. Make sure you discuss any questions you have with your health care provider. Document Released: 10/15/2008 Document Revised: 05/27/2015 Document Reviewed: 01/23/2014 Elsevier Interactive Patient Education  2017 [employer].

## 2021-06-17 NOTE — Telephone Encounter (Signed)
Called and spoke with Andee Poles to see if she had an update about POC and she said no not yet. She said she would reach out to her manager again.

## 2021-06-21 ENCOUNTER — Telehealth: Payer: Self-pay | Admitting: Family Medicine

## 2021-06-21 ENCOUNTER — Encounter: Payer: Self-pay | Admitting: Pulmonary Disease

## 2021-06-21 DIAGNOSIS — J9611 Chronic respiratory failure with hypoxia: Secondary | ICD-10-CM | POA: Diagnosis not present

## 2021-06-21 DIAGNOSIS — J432 Centrilobular emphysema: Secondary | ICD-10-CM | POA: Diagnosis not present

## 2021-06-21 NOTE — Telephone Encounter (Signed)
Spoke with [name] (daughter per [facility]) who states that she just spoke with [employer] who still states they have no order for POC nor nebulizer. Verified that [employer] had verified receiving the order according to prior notes and LVM for Danielle to please update on process of getting POC. [name] stated understanding and would like call today once office hears from Danielle/ [employer] regarding order status.

## 2021-06-21 NOTE — Telephone Encounter (Signed)
Please call and schedule appointment for patient for tomorrow.

## 2021-06-22 ENCOUNTER — Encounter: Payer: Self-pay | Admitting: Pulmonary Disease

## 2021-06-22 ENCOUNTER — Telehealth: Payer: Self-pay | Admitting: Internal Medicine

## 2021-06-22 MED ORDER — ALBUTEROL SULFATE 0.63 MG/3ML IN NEBU
1.0000 | INHALATION_SOLUTION | Freq: Four times a day (QID) | RESPIRATORY_TRACT | 12 refills | Status: DC | PRN
Start: 2021-06-22 — End: 2022-08-10

## 2021-06-22 NOTE — Telephone Encounter (Signed)
Megan Peterson called [place] yesterday and reported that after taking remeron 7.'5mg'$  qhs for 3 days, she's been very lethargic and having difficulty lifting her arms up.  She was already fatigued and depressed.  Reviewed that low dose, remeron is more sedating and an appetite stimulant, but at '30mg'$  it is better for depression, less sedating so we are going to try '30mg'$  at hs at least for 2 nights to see how that works.  Will use the 7.'5mg'$  tablets--4 of them.  If tolerated better, will continue.  Also note that remeron works more quickly than the usual 4-6 wks.

## 2021-06-22 NOTE — Telephone Encounter (Addendum)
Please contact daughter.  I agree with getting her an office visit and I appreciate Dr. Marliss Coots input.  If the patient was doing better off mirtazapine in the meantime, then I think it makes sense to hold that medication tonight and get to the office visit tomorrow.  Thanks.  I routed this message to multiple people as FYI.

## 2021-06-22 NOTE — Telephone Encounter (Signed)
Called reviewed with daughter she will hold as recommended by Dr. Damita Dunnings until evaluation tomorrow.

## 2021-06-22 NOTE — Telephone Encounter (Signed)
Called and spoke to daughter about patient. She was able to talk to palliative care doctor last night and was advised to go up on medications. She has not noticed much change in symptoms. Only question was increased swelling. She has been giving her fluid pills daily for five days. She has not noticed any improvement. She has not been taking much fluid in or urinating as much at all. She is concerned about the selling not improving at all with the medications. They have not checked her weight at all.  Sending for Review by [facility] Dr. Damita Dunnings is out of the office.

## 2021-06-22 NOTE — Telephone Encounter (Signed)
Patients daughter states that change was just made today so she has not given any of new dose yet. When she stopped she did have improvement. She did not take from 7/14 she will restart today. Was advised to give them a call in two days to see how she is doing.   We will schedule office visit to have f/u and labs done. Dr. Damita Dunnings did not have opening so have put in with Dr. Glori Bickers tomorrow.

## 2021-06-22 NOTE — Telephone Encounter (Signed)
My understanding is that she needs evaluation regarding recent mental status changes.  She is an 81 year old lady with history of COPD and oxygen dependent chronic respiratory failure with a history of chronic pain taking gabapentin and hydrocodone at baseline for years who had a recent status change.  It is unclear if it is related to mirtazapine or another issue.  I get the point that a lower dose of mirtazapine may be more sedating than a higher dose but if she clearly felt worse taking the medication then it may make sense to give her a few days off mirtazapine to see if her mentation clears.  It likely makes sense to evaluate her to make sure another obvious issue is not contributing (potentially evaluating for pneumonia, COPD exacerbation, UTI, etc.).    Please let me know if this message does not suffice.  I thank you for your help.

## 2021-06-22 NOTE — Telephone Encounter (Signed)
I see that the palliative medicine doctor did change the patient from 7.'5mg'$  Remeron to '30mg'$  QHS. Per triage note when she stopped the Remeron she did improve with symptoms. This note states she has not noticed a lot of change in symptoms? This is in regards to the lethargy/fatigue/weakness?  It hard to tell with the swelling with out seeing the patient and likely checking labs. If she is giving the fluid pill with out the patient urinating she may need an increase but I would need labs to make sure her kidneys are ok before we make a decision.

## 2021-06-22 NOTE — Telephone Encounter (Signed)
Megan Peterson, can you give me a nutshell presentation before I see her tomorrow? What am I looking for? Thanks

## 2021-06-23 ENCOUNTER — Ambulatory Visit: Payer: Medicare Other | Admitting: Family Medicine

## 2021-06-23 ENCOUNTER — Encounter: Payer: Self-pay | Admitting: Internal Medicine

## 2021-06-23 NOTE — Telephone Encounter (Signed)
It looks like they cancelled the appointment with me

## 2021-06-24 ENCOUNTER — Telehealth: Payer: Self-pay | Admitting: Pulmonary Disease

## 2021-06-24 NOTE — Telephone Encounter (Signed)
Please get update on patient.  Thanks. 

## 2021-06-27 ENCOUNTER — Ambulatory Visit: Payer: Medicare Other | Admitting: Family Medicine

## 2021-06-27 ENCOUNTER — Ambulatory Visit (INDEPENDENT_AMBULATORY_CARE_PROVIDER_SITE_OTHER): Payer: Medicare Other | Admitting: Family Medicine

## 2021-06-27 ENCOUNTER — Encounter: Payer: Self-pay | Admitting: Family Medicine

## 2021-06-27 VITALS — BP 120/72 | HR 77 | Temp 98.1°F | Ht <= 58 in | Wt 140.0 lb

## 2021-06-27 DIAGNOSIS — R21 Rash and other nonspecific skin eruption: Secondary | ICD-10-CM

## 2021-06-27 DIAGNOSIS — E785 Hyperlipidemia, unspecified: Secondary | ICD-10-CM

## 2021-06-27 DIAGNOSIS — R413 Other amnesia: Secondary | ICD-10-CM | POA: Diagnosis not present

## 2021-06-27 NOTE — Progress Notes (Signed)
Bilateral leg swelling.  Clearly better today.  Had been increasingly ambulatory recently.     Spot on back is changing and getting bigger and itchy; has bled at times.  Wanted eval done.  See exam.     Rash that she has had off and on 2 years or so is back, itchy lesions on L shoulder and B thigh.  TAC helped prev. She has been scratching the area.     Mirtazapine use d/w pt.  Patient took it for 3 days but then had significant drowsiness.  That got better after stopping the med.  Was using that instead of advil PM at night.     Her memory is worse than prior baseline.  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  She can't remember about having changes with bowel continence.     Patient was off lipitor.  D/w pt.  Med list updated.  Not on crestor.    No FCNAVD.  No burning with urination.    Meds, vitals, and allergies reviewed.   ROS: Per HPI unless specifically indicated in ROS section   Nad Ncat She admits to short-term memory loss during our conversation. Neck supple, no LA Rrr Ctab Abd soft not ttp.   No extremity edema.   She has irritated SK on the back, no ulceration.  Benign appearing Excoriated rash on the left shoulder noted.  No ulceration. Skin well perfused.  30 minutes were devoted to patient care in this encounter (this includes time spent reviewing the patient's file/history, interviewing and examining the patient, counseling/reviewing plan with patient).

## 2021-06-28 ENCOUNTER — Telehealth: Payer: Self-pay

## 2021-06-28 ENCOUNTER — Encounter: Payer: Self-pay | Admitting: Family Medicine

## 2021-06-28 DIAGNOSIS — R413 Other amnesia: Secondary | ICD-10-CM | POA: Diagnosis not present

## 2021-06-28 DIAGNOSIS — J9611 Chronic respiratory failure with hypoxia: Secondary | ICD-10-CM | POA: Diagnosis not present

## 2021-06-28 LAB — CBC WITH DIFFERENTIAL/PLATELET
Basophils Absolute: 0.2 10*3/uL — ABNORMAL HIGH (ref 0.0–0.1)
Basophils Relative: 1.8 % (ref 0.0–3.0)
Eosinophils Absolute: 0.2 10*3/uL (ref 0.0–0.7)
Eosinophils Relative: 2.1 % (ref 0.0–5.0)
HCT: 39.2 % (ref 36.0–46.0)
Hemoglobin: 13.1 g/dL (ref 12.0–15.0)
Lymphocytes Relative: 27.9 % (ref 12.0–46.0)
Lymphs Abs: 2.5 10*3/uL (ref 0.7–4.0)
MCHC: 33.6 g/dL (ref 30.0–36.0)
MCV: 94.4 fl (ref 78.0–100.0)
Monocytes Absolute: 0.8 10*3/uL (ref 0.1–1.0)
Monocytes Relative: 8.9 % (ref 3.0–12.0)
Neutro Abs: 5.4 10*3/uL (ref 1.4–7.7)
Neutrophils Relative %: 59.3 % (ref 43.0–77.0)
Platelets: 247 10*3/uL (ref 150.0–400.0)
RBC: 4.15 Mil/uL (ref 3.87–5.11)
RDW: 12.8 % (ref 11.5–15.5)
WBC: 9 10*3/uL (ref 4.0–10.5)

## 2021-06-28 LAB — COMPREHENSIVE METABOLIC PANEL
ALT: 10 U/L (ref 0–35)
AST: 19 U/L (ref 0–37)
Albumin: 4.2 g/dL (ref 3.5–5.2)
Alkaline Phosphatase: 107 U/L (ref 39–117)
BUN: 23 mg/dL (ref 6–23)
CO2: 31 mEq/L (ref 19–32)
Calcium: 9.6 mg/dL (ref 8.4–10.5)
Chloride: 99 mEq/L (ref 96–112)
Creatinine, Ser: 1.18 mg/dL (ref 0.40–1.20)
GFR: 43.77 mL/min — ABNORMAL LOW (ref >60.00)
Glucose, Bld: 82 mg/dL (ref 70–99)
Potassium: 4.4 mEq/L (ref 3.5–5.1)
Sodium: 140 mEq/L (ref 135–145)
Total Bilirubin: 0.5 mg/dL (ref 0.2–1.2)
Total Protein: 7 g/dL (ref 6.0–8.3)

## 2021-06-28 LAB — VITAMIN B12: Vitamin B-12: 204 pg/mL — ABNORMAL LOW (ref 211–911)

## 2021-06-28 LAB — TSH: TSH: 2.28 u[IU]/mL (ref 0.35–5.50)

## 2021-06-29 ENCOUNTER — Other Ambulatory Visit: Payer: Self-pay | Admitting: Family Medicine

## 2021-06-29 DIAGNOSIS — E538 Deficiency of other specified B group vitamins: Secondary | ICD-10-CM

## 2021-06-29 HISTORY — DX: Deficiency of other specified B group vitamins: E53.8

## 2021-06-29 LAB — URINE CULTURE
MICRO NUMBER:: 13577967
Result:: NO GROWTH
SPECIMEN QUALITY:: ADEQUATE

## 2021-06-29 LAB — URINALYSIS, ROUTINE W REFLEX MICROSCOPIC
Bilirubin Urine: NEGATIVE
Hgb urine dipstick: NEGATIVE
Nitrite: NEGATIVE
RBC / HPF: NONE SEEN (ref 0–?)
Specific Gravity, Urine: 1.02 (ref 1.000–1.030)
Total Protein, Urine: NEGATIVE
Urine Glucose: NEGATIVE
Urobilinogen, UA: 2 — AB (ref 0.0–1.0)
pH: 6.5 (ref 5.0–8.0)

## 2021-06-29 MED ORDER — VITAMIN B-12 1000 MCG PO TABS: 1000.0000 ug | ORAL_TABLET | Freq: Every day | ORAL | Status: DC

## 2021-06-29 NOTE — Assessment & Plan Note (Signed)
Would restart triamcinolone on the rash.  Irritated seborrheic keratosis should heal over.  We can address this later on if it stays irritated.  None of the lesions look cancerous.

## 2021-06-29 NOTE — Telephone Encounter (Signed)
Called and spoke with Megan Peterson at [facility] to do RX for nebulizer for patient. They have placed order. Nothing further needed at this time.

## 2021-06-29 NOTE — Assessment & Plan Note (Signed)
Off Lipitor and med list updated.

## 2021-06-29 NOTE — Assessment & Plan Note (Signed)
With worsening drowsiness after taking mirtazapine.  Discussed options.  Would hold medication for now and look for other reversible causes like UTI.  See notes on labs.  Lungs clear.  Would hold mirtazapine for now.

## 2021-06-30 ENCOUNTER — Other Ambulatory Visit: Payer: Self-pay | Admitting: Family Medicine

## 2021-07-01 NOTE — Addendum Note (Signed)
Addended by: Valerie Salts on: 07/01/2021 10:51 AM   Modules accepted: Orders

## 2021-07-04 ENCOUNTER — Telehealth: Payer: Self-pay

## 2021-07-04 MED ORDER — HYDROCODONE-ACETAMINOPHEN 5-325 MG PO TABS: 1.0000 | ORAL_TABLET | Freq: Four times a day (QID) | ORAL | 0 refills | Status: DC | PRN

## 2021-07-04 NOTE — Telephone Encounter (Signed)
Sent. Thanks.  Please get update on patient.  See mychart message.

## 2021-07-04 NOTE — Addendum Note (Signed)
Addended by: Tonia Ghent on: 07/04/2021 02:04 PM   Modules accepted: Orders

## 2021-07-04 NOTE — Addendum Note (Signed)
Addended by: Sherrilee Gilles B on: 07/04/2021 03:01 PM   Modules accepted: Orders

## 2021-07-04 NOTE — Telephone Encounter (Signed)
LOV - 06/27/21 NOV - not scheduled RF - 04/03/21 #120/0

## 2021-07-04 NOTE — Telephone Encounter (Signed)
MEDICATION: HYDROcodone-acetaminophen (NORCO/VICODIN) 5-325 MG tablet  PHARMACY: [place] ([place]), [place] - [address]  Comments:  **Let patient know to contact pharmacy at the end of the day to make sure medication is ready. **  ** Please notify patient to allow 48-72 hours to process**  **Encourage patient to contact the pharmacy for refills or they can request refills through [facility]**

## 2021-07-04 NOTE — Telephone Encounter (Signed)
Called and spoke with Megan Peterson and she stated patient is doing well. She mistakenly stated in the mychart message that patient was on mirtazapine but she stopped that when they were in the office on 06/27/21. Patient is taking namenda instead. She has not noticed any difference since B12 injection. Mood has been better; they were out shopping when I called.

## 2021-07-06 NOTE — Telephone Encounter (Signed)
If she is doing some better then I would continue as is.  Please update me as needed.  Thanks.

## 2021-07-08 DIAGNOSIS — J9611 Chronic respiratory failure with hypoxia: Secondary | ICD-10-CM | POA: Diagnosis not present

## 2021-07-13 ENCOUNTER — Telehealth: Payer: Self-pay

## 2021-07-13 NOTE — Chronic Care Management (AMB) (Signed)
Chronic Care Management Pharmacy Assistant   Name: Megan Peterson  MRN: [id number] DOB: 06-05-1940   Reason for Encounter: Reminder Call   Conditions to be addressed/monitored: HTN, HLD, and COPD   Recent office visits:  06/27/21-Megan Duncan,MD(PCP)- memory change,off lipitor,triamcinolone for rash,Would hold mirtazapine for now.Labs ordered(Urinalysis was abnormal but urine culture is negative.TSH is normal.  B12 is low.  Sugar kidney and liver tests are stable.  Blood counts are fine. 03/21/21-Megan Duncan,MD(PCP)-Telemedicine f/u dyspnea, hold lipitor for 10 days,reasonable for trial of lasix for a few days and see if SOBS and edema improve. 02/04/21-Megan Duncan,MD(PCP)-f/u chest pain,labs ordered(Potassium is elevated.  Would take sodium zirconium cyclosilicate 09N daily on today and tomorrow-lipids elevated and that was expected off statin.Other labs are okay,referral for cardiology.  Recent consult visits:  06/13/21-Megan Reed,DO(geriatric)-Telemedicine,start mirtazapine 7.'5mg'$  take 1 tablet at bedtime,continue O2 2L,f/u 2 months  05/11/21-Megan Alva,MD(pulmon)-f/u COPD,provide portable O2,provide nebulizer and albuterol for emergency.Referral for palliative care, f/u 6 months 02/18/21-Megan O'Neal,MD(cardio)-chest pain,EKG,labs,schedule CTA,repeat echo. She will take 25 mg of metoprolol succinate 2 hours before the scan   Hospital visits:  None in previous 6 months  Medications: Outpatient Encounter Medications as of 07/13/2021  Medication Sig   albuterol (ACCUNEB) 0.63 MG/3ML nebulizer solution Take 3 mLs (0.63 mg total) by nebulization every 6 (six) hours as needed for wheezing.   albuterol (VENTOLIN HFA) 108 (90 Base) MCG/ACT inhaler INHALE 1-2 PUFFS INTO THE LUNGS EVERY 6 HOURS AS NEEDED FOR WHEEZING OR SHORTNESS OF BREATH   ANORO ELLIPTA 62.5-25 MCG/ACT AEPB Inhale 1 puff by mouth once daily   atorvastatin (LIPITOR) 20 MG tablet Take 20 mg by mouth 2 (two) times a week.  (Patient not taking: Reported on 06/27/2021)   furosemide (LASIX) 20 MG tablet TAKE 1 TABLET BY MOUTH ONCE DAILY AS NEEDED IN THE MORNING FOR FLUID   gabapentin (NEURONTIN) 800 MG tablet Take 1 tablet (800 mg total) by mouth 3 (three) times daily.   HYDROcodone-acetaminophen (NORCO/VICODIN) 5-325 MG tablet Take 1 tablet by mouth every 6 (six) hours as needed for moderate pain.   memantine (NAMENDA) 10 MG tablet 1 TABLET BY MOUTH TWICEDAILY THEREAFTER.   metoprolol succinate (TOPROL-XL) 25 MG 24 hr tablet Take 1 tablet (25 mg total) by mouth daily.   nitroGLYCERIN (NITROSTAT) 0.4 MG SL tablet Place 1 tablet (0.4 mg total) under the tongue every 5 (five) minutes as needed for chest pain (max 3 doses in 15 minutes.  if still having chest pain then go to the ER).   ondansetron (ZOFRAN) 4 MG tablet Take 4 mg by mouth every 8 (eight) hours as needed for nausea or vomiting.   OXYGEN Inhale 2 L into the lungs at bedtime.   triamcinolone cream (KENALOG) 0.5 % Apply 1 application topically 2 (two) times daily. Put on rash on the legs twice a day   vitamin B-12 (CYANOCOBALAMIN) 1000 MCG tablet Take 1 tablet (1,000 mcg total) by mouth daily.   No facility-administered encounter medications on file as of 07/13/2021.   [name] was contacted to remind of upcoming telephone visit with [name]  on 07/18/21 at 10:15am. Patient was reminded to have any blood glucose and blood pressure readings available for review at appointment.   Message was left reminding patient of appointment.   CCM referral has been placed prior to visit?  No    Star Rating Drugs: Medication:  Last Fill: Day Supply Atorvastatin '20mg'$  03/07/21  90  off medication per note 06/27/21  [name], CPP notified  [name], [place]  [phone]

## 2021-07-14 ENCOUNTER — Telehealth: Payer: Medicare Other

## 2021-07-18 ENCOUNTER — Ambulatory Visit: Payer: Medicare Other | Admitting: Pharmacist

## 2021-07-18 DIAGNOSIS — J432 Centrilobular emphysema: Secondary | ICD-10-CM

## 2021-07-18 DIAGNOSIS — E785 Hyperlipidemia, unspecified: Secondary | ICD-10-CM

## 2021-07-18 DIAGNOSIS — R413 Other amnesia: Secondary | ICD-10-CM

## 2021-07-18 DIAGNOSIS — I10 Essential (primary) hypertension: Secondary | ICD-10-CM

## 2021-07-18 NOTE — Progress Notes (Signed)
Chronic Care Management Pharmacy Note  07/27/2021 Name:  Megan Peterson MRN:  [id number] DOB:  03-18-1940  Summary: CCM F/U visit -Reviewed medications; daughter [name] administers each day and adherence appears improved -Family reports low mood - crying spells, feeling down on herself; family reports B12 improved energy/alertness during the day but mood has not changed -Pt is taking Advil PM regularly to help with sleep; reviewed Benadryl component can contribute to memory loss  Recommendations/Changes made from today's visit: -Consider SSRI or bupropion; pt will call if they want to go this route -Advised to limit "PM" products as much as possible  Plan: -[facility] will call patient 1 month to check on mood -Pharmacist follow up televisit scheduled for 3 months    Subjective: Megan Peterson is an 81 y.o. year old female who is a primary patient of Damita Dunnings, [name], MD.  The CCM team was consulted for assistance with disease management and care coordination needs.    Engaged with patient by telephone for follow up visit in response to provider referral for pharmacy case management and/or care coordination services.   Consent to Services:  The patient was given information about Chronic Care Management services, agreed to services, and gave verbal consent prior to initiation of services.  Please see initial visit note for detailed documentation.   Patient Care Team: Tonia Ghent, MD as PCP - General (Family Medicine) Druscilla Brownie, MD (Dermatology) Birder Robson, MD as Referring Physician (Ophthalmology) Charlton Haws, [facility] as Pharmacist (Pharmacist)  Recent office visits: 06/27/21-Graham Duncan,MD(PCP)- memory Radene Gunning lipitor,triamcinolone for rash,Would hold mirtazapine for now.Labs ordered(Urinalysis was abnormal but urine culture is negative.TSH is normal.  B12 is low.  Sugar kidney and liver tests are stable.  Blood counts are fine. 03/21/21-Graham  Duncan,MD(PCP)-Telemedicine f/u dyspnea, hold lipitor for 10 days,reasonable for trial of lasix for a few days and see if SOBS and edema improve. 02/04/21-Graham Duncan,MD(PCP)-f/u chest pain,labs ordered(Potassium is elevated.  Would take sodium zirconium cyclosilicate 47S daily on today and tomorrow-lipids elevated and that was expected off statin.Other labs are okay,referral for cardiology.  Recent consult visits: 06/13/21-Tiffany Reed,DO(geriatric)-Telemedicine,start mirtazapine 7.15m take 1 tablet at bedtime,continue O2 2L,f/u 2 months  05/11/21-Rakesh Alva,MD (Pulmonology)-f/u COPD,provide portable O2,provide nebulizer and albuterol for emergency.Referral for palliative care, f/u 6 months 02/18/21-[place] O'Neal,MD(cardio)-chest pain,EKG,labs,schedule CTA,repeat echo. Mild CAD on scan - start atorvastatin 20 mg.  Hospital visits: None in previous 6 months   Objective:  Lab Results  Component Value Date   CREATININE 1.18 06/27/2021   BUN 23 06/27/2021   GFR 43.77 (L) 06/27/2021   EGFR 49 (L) 02/18/2021   GFRNONAA >60 09/07/2020   GFRAA 58 (L) 02/04/2019   NA 140 06/27/2021   K 4.4 06/27/2021   CALCIUM 9.6 06/27/2021   CO2 31 06/27/2021   GLUCOSE 82 06/27/2021    Lab Results  Component Value Date/Time   GFR 43.77 (L) 06/27/2021 03:29 PM   GFR 42.59 (L) 02/04/2021 11:52 AM    Last diabetic Eye exam: No results found for: "HMDIABEYEEXA"  Last diabetic Foot exam: No results found for: "HMDIABFOOTEX"   Lab Results  Component Value Date   CHOL 266 (H) 02/04/2021   HDL 52.70 02/04/2021   LDLCALC 175 (H) 02/04/2021   LDLDIRECT 143.1 05/20/2012   TRIG 188.0 (H) 02/04/2021   CHOLHDL 5 02/04/2021       Latest Ref Rng & Units 06/27/2021    3:29 PM 02/04/2021   11:52 AM 12/16/2019    9:20  AM  Hepatic Function  Total Protein 6.0 - 8.3 g/dL 7.0  6.8  6.7   Albumin 3.5 - 5.2 g/dL 4.2  4.2  4.1   AST 0 - 37 U/L _0 ALT 0 - 35 U/L _1 Alk Phosphatase 39 - 117 U/L  107  105  98   Total Bilirubin 0.2 - 1.2 mg/dL 0.5  0.4  0.4     Lab Results  Component Value Date/Time   TSH 2.28 06/27/2021 03:29 PM   TSH 2.66 02/04/2021 11:52 AM       Latest Ref Rng & Units 06/27/2021    3:29 PM 02/04/2021   11:52 AM 09/07/2020   11:31 AM  CBC  WBC 4.0 - 10.5 K/uL 9.0  8.4  10.1   Hemoglobin 12.0 - 15.0 g/dL 13.1  12.4  14.2   Hematocrit 36.0 - 46.0 % 39.2  38.2  40.9   Platelets 150.0 - 400.0 K/uL 247.0  294.0  295     Lab Results  Component Value Date/Time   VD25OH 34.83 11/12/2018 07:54 AM   VD25OH 22.30 (L) 04/13/2017 12:52 PM    Clinical ASCVD: Yes  The ASCVD Risk score (Arnett DK, et al., 2019) failed to calculate for the following reasons:   The 2019 ASCVD risk score is only valid for ages 49 to 42       06/16/2021    3:51 PM 12/16/2019    8:31 AM 11/18/2018    1:58 PM  Depression screen PHQ 2/9  Decreased Interest 3 0 0  Down, Depressed, Hopeless 3 0 0  PHQ - 2 Score 6 0 0  Altered sleeping 0  0  Tired, decreased energy 1  0  Change in appetite 0  0  Feeling bad or failure about yourself  0  0  Trouble concentrating 0  0  Moving slowly or fidgety/restless 0  0  Suicidal thoughts 0  0  PHQ-9 Score 7  0  Difficult doing work/chores Somewhat difficult  Not difficult at all     Social History   Tobacco Use  Smoking Status Former   Years: 40.00   Types: Cigarettes   Quit date: 01/03/1991   Years since quitting: 30.5  Smokeless Tobacco Never   BP Readings from Last 3 Encounters:  06/27/21 120/72  05/11/21 128/74  03/21/21 107/73   Pulse Readings from Last 3 Encounters:  06/27/21 77  05/11/21 92  03/21/21 71   Wt Readings from Last 3 Encounters:  06/27/21 140 lb (63.5 kg)  06/16/21 140 lb (63.5 kg)  05/11/21 146 lb 3.2 oz (66.3 kg)   BMI Readings from Last 3 Encounters:  06/27/21 31.39 kg/m  06/16/21 31.39 kg/m  05/11/21 29.53 kg/m    Assessment/Interventions: Review of patient past medical history, allergies,  medications, health status, including review of consultants reports, laboratory and other test data, was performed as part of comprehensive evaluation and provision of chronic care management services.   SDOH:  (Social Determinants of Health) assessments and interventions performed: No - done June 2023  SDOH Screenings   Alcohol Screen: Not on file  Depression (PHQ2-9): Medium Risk (06/16/2021)   Depression (PHQ2-9)    PHQ-2 Score: 7  Financial Resource Strain: Low Risk  (06/16/2021)   Overall Financial Resource Strain (CARDIA)    Difficulty of Paying Living Expenses: Not hard at all  Food Insecurity: No Food Insecurity (06/16/2021)   Hunger Vital Sign  Worried About Charity fundraiser in the Last Year: Never true    [place] in the Last Year: Never true  Housing: Not on file  Physical Activity: Inactive (06/16/2021)   Exercise Vital Sign    Days of Exercise per Week: 0 days    Minutes of Exercise per Session: 0 min  Social Connections: Not on file  Stress: No Stress Concern Present (06/16/2021)   [place]    Feeling of Stress : Not at all  Tobacco Use: Medium Risk (06/27/2021)   Patient History    Smoking Tobacco Use: Former    Smokeless Tobacco Use: Never    Passive Exposure: Not on file  Transportation Needs: No Transportation Needs (06/16/2021)   PRAPARE - Hydrologist (Medical): No    Lack of Transportation (Non-Medical): No    CCM Care Plan  Allergies  Allergen Reactions   Buspar [Buspirone] Other (See Comments)    Dizzy and nauseated.   Cymbalta [Duloxetine Hcl] Other (See Comments)    fatigue   Lexapro [Escitalopram Oxalate] Other (See Comments)    Per patient, intolerant   Meperidine Hcl Other (See Comments)    unknown   Morphine And Related Other (See Comments)    Vomiting    Statins Other (See Comments)    Myalgias on statins    Medications Reviewed  Today     Reviewed by Charlton Haws, [facility] (Pharmacist) on 07/27/21 at 1554  Med List Status: <None>   Medication Order Taking? Sig Documenting Provider Last Dose Status Informant  albuterol (ACCUNEB) 0.63 MG/3ML nebulizer solution [id number] Yes Take 3 mLs (0.63 mg total) by nebulization every 6 (six) hours as needed for wheezing. Rigoberto Noel, MD Taking Active   albuterol (VENTOLIN HFA) 108 (90 Base) MCG/ACT inhaler [id number] Yes INHALE 1-2 PUFFS INTO THE LUNGS EVERY 6 HOURS AS NEEDED FOR WHEEZING OR SHORTNESS OF Willia Craze, MD Taking Active   [facility] ELLIPTA 62.5-25 MCG/ACT AEPB [id number] Yes Inhale 1 puff by mouth once daily Tonia Ghent, MD Taking Active   furosemide (LASIX) 20 MG tablet [id number] Yes TAKE 1 TABLET BY MOUTH ONCE DAILY AS NEEDED IN THE MORNING FOR FLUID Tonia Ghent, MD Taking Active   gabapentin (NEURONTIN) 800 MG tablet [id number] Yes Take 1 tablet (800 mg total) by mouth 3 (three) times daily. Reed, Tiffany L, DO Taking Active   HYDROcodone-acetaminophen (NORCO/VICODIN) 5-325 MG tablet [id number] Yes Take 1 tablet by mouth every 6 (six) hours as needed for moderate pain. Tonia Ghent, MD Taking Active   Ibuprofen-diphenhydrAMINE HCl (ADVIL PM) 200-25 MG CAPS [id number] Yes Take 1 tablet by mouth at bedtime. [provider] Taking Active   memantine (NAMENDA) 10 MG tablet [id number] Yes 1 TABLET BY MOUTH TWICEDAILY THEREAFTER. Pieter Partridge, DO Taking Active   metoprolol succinate (TOPROL-XL) 25 MG 24 hr tablet [id number] Yes Take 1 tablet (25 mg total) by mouth daily. Tonia Ghent, MD Taking Active   nitroGLYCERIN (NITROSTAT) 0.4 MG SL tablet [id number] No Place 1 tablet (0.4 mg total) under the tongue every 5 (five) minutes as needed for chest pain (max 3 doses in 15 minutes.  if still having chest pain then go to the ER).  Patient not taking: Reported on 07/18/2021   Gayland Curry, DO Not Taking Active   ondansetron (ZOFRAN) 4 MG tablet  [id number] No Take 4 mg by mouth every  8 (eight) hours as needed for nausea or vomiting.  Patient not taking: Reported on 07/18/2021   [provider] Not Taking Active   OXYGEN [id number] Yes Inhale 2 L into the lungs at bedtime. [provider] Taking Active   triamcinolone cream (KENALOG) 0.5 % [id number] Yes Apply 1 application topically 2 (two) times daily. Put on rash on the legs twice a day Tonia Ghent, MD Taking Active   vitamin B-12 (CYANOCOBALAMIN) 1000 MCG tablet [id number] Yes Take 1 tablet (1,000 mcg total) by mouth daily. Tonia Ghent, MD Taking Active             Patient Active Problem List   Diagnosis Date Noted   B12 deficiency 06/29/2021   Dyspnea 03/23/2021   Fecal incontinence 01/09/2021   Medication management 07/29/2019   Joint pain 04/23/2019   Memory loss 10/06/2018   Neck pain 04/02/2018   Rash 11/18/2017   Bursitis 08/03/2017   Chronic back pain 12/19/2016   Health care maintenance 10/18/2016   Hypertension 10/18/2016   TIA (transient ischemic attack) 10/18/2016   Radicular leg pain 07/30/2016   Left knee pain 07/10/2016   Tachycardia 05/01/2016   Chronic respiratory failure with hypoxia ([place]) 04/21/2016   COPD with acute exacerbation ([place]) 04/21/2016   Bronchiectasis ([place])    Encounter for chronic pain management 01/18/2016   Vitamin D deficiency 02/25/2015   Osteopenia 01/02/2014   Medicare annual wellness visit, subsequent 12/12/2013   Advanced care planning/counseling discussion 12/12/2013   Right carotid bruit 12/12/2013   Generalized anxiety disorder    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   Chest pain 05/16/2007   HLD (hyperlipidemia) 09/26/2006    Immunization History  Administered Date(s) Administered   Fluad Quad(high Dose 65+) 11/19/2018, 10/02/2019, 10/28/2020   Influenza, High Dose Seasonal PF 10/13/2014   Influenza,inj,Quad PF,6+ Mos 09/17/2013, 10/17/2016    Influenza-Unspecified 12/09/2015, 11/02/2017   PFIZER(Purple Top)SARS-COV-2 Vaccination 02/16/2019, 03/11/2019, 01/09/2020   Pneumococcal Conjugate-13 03/11/2013   Pneumococcal Polysaccharide-23 06/05/2011   Tdap 06/07/2011   Zoster Recombinat (Shingrix) 11/26/2018   Zoster, Live 09/06/2011    Conditions to be addressed/monitored:  Hypertension, Hyperlipidemia, and COPD, Memory impairment, Query depression  Care Plan : [place]  Updates made by Charlton Haws, [place] since 07/27/2021 12:00 AM     Problem: Hypertension, Hyperlipidemia, and COPD, Memory impairment, Query depression   Priority: High     Long-Range Goal: Disease Management   Start Date: 07/28/2020  Priority: High  Note:   Current Barriers:  Struggles to self administer medications as prescribed  Pharmacist Clinical Goal(s):  Patient will contact provider office for questions/concerns as evidenced notation of same in electronic health record through collaboration with PharmD and provider.   Interventions: 1:1 collaboration with Tonia Ghent, MD regarding development and update of comprehensive plan of care as evidenced by provider attestation and co-signature Inter-disciplinary care team collaboration (see longitudinal plan of care) Comprehensive medication review performed; medication list updated in electronic medical record   Medication Management -Poor adherence -Reports she prefers less medications. Daughter, [name], has placed the AM pill bottles in a separate bag and PM in separate bags and calls patient daily at 8, 2 and 8 to check in on her. Sometimes she gets confused on whether she has taken them or not yet. She will check bottle later on and note pt did not take them despite calling. - She denies side effects with her medications.  - She prefers to  continue driving to pharmacy and self-admin meds.  - She has tried pill packs and pill planner and patient did not take as prescribed. She  prefers vials so she can read the name/dosing.   Hypertension (BP goal <140/90) -Controlled -  patient not taking as prescribed - unable to half tablets -taking furosemide every day for swelling -Home BP readings: -Current treatment: Metoprolol succinate 25 mg daily - Appropriate, Effective, Safe, Accessible Furosemide 20 mg PRN -Appropriate, Effective, Safe, Accessible -Medications previously tried: none reported  -Educated on Importance of home blood pressure monitoring; -Counseled to monitor BP periodically -Recommend to continue current medication  Hyperlipidemia: (LDL goal < 70) -Uncontrolled - LDL 175 (02/2021), pt has not tolerated statins -Current treatment: Nitroglycerin 0.4 mg PRN - not used yet -Medications previously tried: rosuvastatin, atorvastatin (myalgia) -Recommended to continue current medication  COPD (Goal: control symptoms and prevent exacerbations) -Controlled - pt is not consistent with inhaler use, she denies SOB/wheezing currently -Followed by pulmonology  -Current treatment  Anoro Ellipta 1 puff daily - Appropriate, Effective, Safe, Accessible Albuterol HFA PRN -Appropriate, Effective, Safe, Accessible Oxygen (night) -Medications previously tried: none reported  -Reviewed benefits of consistent use of Anoro to prevent exacerbations -Recommended to continue current medication  Memory Impairment (Goal: Prevent memory decline) -Query controlled - of note patient is taking Advil PM nightly to help with sleep; she recently failed mirtazapine due to excess drowsiness and reports she was advised per PCP to continue Advil PM -Current treatment: Memantine 10 mg BID - Appropriate, Effective, Safe, Accessible -Medications previously tried/failed: donepezil -Reviewed benefits of memantine - slowing decline, not improving memory -Reviewed risks of Advil PM - benadryl component can contribute to memory loss -Advised to limit use of Advil PM as much as  possible  Query depression (Goal: improve symptoms) -Pt Family reports low mood - crying spells, feeling down on herself; family reports B12 improved energy/alertness during the day but mood has not changed -PHQ9: 7 (06/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 -Connected with PCP for mental health support -Current treatment: None -Medications previously tried/failed: duloxetine (fatigue), mirtazapine (drowsiness), lexapro (intolerance), buspar (dizziness) -Educated on Benefits of medication for symptom control Benefits of cognitive-behavioral therapy with or without medication -Consider SSRI or bupropion - pt will contact PCP if they want to go this route  Health Maintenance -Vaccine gaps: Shingrix, TDAP -Low B12 (204) - started B12 oral supplement a few weeks ago and reports improvement in energy  Patient Goals/Self-Care Activities Patient will:  - take medications as prescribed as evidenced by patient report and record review focus on medication adherence by routine Contact PCP for mood medication if desired       Medication Assistance: None required.  Patient affirms current coverage meets needs.  Compliance/Adherence/Medication fill history: Care Gaps: None  Star-Rating Drugs: None  Medication Access: Within the past 30 days, how often has patient missed a dose of medication? often Is a pillbox or other method used to improve adherence? No  Factors that may affect medication adherence? nonadherence to medications and memory impairment Are meds synced by current pharmacy? No  Are meds delivered by current pharmacy? No  Does patient experience delays in picking up medications due to transportation concerns? No   [facility] Services Reviewed: Is patient disadvantaged to use [facility]?: No  Current Rx insurance plan: [facility] Name and location of Current pharmacy:  [facility] ([address]), [place] [address] [place]) [place] [address] Phone:  [phone] Fax: [phone]  [place], [place] [address] Phone: [phone] Fax: [phone]  [facility] services reviewed with patient today?: No  Patient requests to transfer care to [facility]?: No  Reason patient declined to change pharmacies: Prefers to go out and pick up medications themselves   Care Plan and Follow Up Patient Decision:  Patient agrees to Care Plan and Follow-up.  Plan: Telephone follow up appointment with care management team member scheduled for:  3 months  [name], PharmD, BCACP Clinical Pharmacist [facility] at [facility] [phone]

## 2021-07-21 DIAGNOSIS — J9611 Chronic respiratory failure with hypoxia: Secondary | ICD-10-CM | POA: Diagnosis not present

## 2021-07-27 NOTE — Patient Instructions (Signed)
Visit Information  Phone number for Pharmacist: [phone]   Goals Addressed   None     Care Plan : [place]  Updates made by Charlton Haws, [place] since 07/27/2021 12:00 AM     Problem: Hypertension, Hyperlipidemia, and COPD, Memory impairment, Query depression   Priority: High     Long-Range Goal: Disease Management   Start Date: 07/28/2020  Priority: High  Note:   Current Barriers:  Struggles to self administer medications as prescribed  Pharmacist Clinical Goal(s):  Patient will contact provider office for questions/concerns as evidenced notation of same in electronic health record through collaboration with PharmD and provider.   Interventions: 1:1 collaboration with Tonia Ghent, MD regarding development and update of comprehensive plan of care as evidenced by provider attestation and co-signature Inter-disciplinary care team collaboration (see longitudinal plan of care) Comprehensive medication review performed; medication list updated in electronic medical record   Medication Management -Poor adherence -Reports she prefers less medications. Daughter, [name], has placed the AM pill bottles in a separate bag and PM in separate bags and calls patient daily at 8, 2 and 8 to check in on her. Sometimes she gets confused on whether she has taken them or not yet. She will check bottle later on and note pt did not take them despite calling. - She denies side effects with her medications.  - She prefers to continue driving to pharmacy and self-admin meds.  - She has tried pill packs and pill planner and patient did not take as prescribed. She prefers vials so she can read the name/dosing.   Hypertension (BP goal <140/90) -Controlled -  patient not taking as prescribed - unable to half tablets -taking furosemide every day for swelling -Home BP readings: -Current treatment: Metoprolol succinate 25 mg daily - Appropriate, Effective, Safe,  Accessible Furosemide 20 mg PRN -Appropriate, Effective, Safe, Accessible -Medications previously tried: none reported  -Educated on Importance of home blood pressure monitoring; -Counseled to monitor BP periodically -Recommend to continue current medication  Hyperlipidemia: (LDL goal < 70) -Uncontrolled - LDL 175 (02/2021), pt has not tolerated statins -Current treatment: Nitroglycerin 0.4 mg PRN - not used yet -Medications previously tried: rosuvastatin, atorvastatin (myalgia) -Recommended to continue current medication  COPD (Goal: control symptoms and prevent exacerbations) -Controlled - pt is not consistent with inhaler use, she denies SOB/wheezing currently -Followed by pulmonology  -Current treatment  Anoro Ellipta 1 puff daily - Appropriate, Effective, Safe, Accessible Albuterol HFA PRN -Appropriate, Effective, Safe, Accessible Oxygen (night) -Medications previously tried: none reported  -Reviewed benefits of consistent use of Anoro to prevent exacerbations -Recommended to continue current medication  Memory Impairment (Goal: Prevent memory decline) -Query controlled - of note patient is taking Advil PM nightly to help with sleep; she recently failed mirtazapine due to excess drowsiness and reports she was advised per PCP to continue Advil PM -Current treatment: Memantine 10 mg BID - Appropriate, Effective, Safe, Accessible -Medications previously tried/failed: donepezil -Reviewed benefits of memantine - slowing decline, not improving memory -Reviewed risks of Advil PM - benadryl component can contribute to memory loss -Advised to limit use of Advil PM as much as possible  Query depression (Goal: improve symptoms) -Pt Family reports low mood - crying spells, feeling down on herself; family reports B12 improved energy/alertness during the day but mood has not changed -PHQ9: 7 (06/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 -Connected with PCP for mental health support -Current  treatment: None -Medications previously tried/failed: duloxetine (fatigue), mirtazapine (drowsiness), lexapro (intolerance), buspar (  dizziness) -Educated on Benefits of medication for symptom control Benefits of cognitive-behavioral therapy with or without medication -Consider SSRI or bupropion - pt will contact PCP if they want to go this route  Health Maintenance -Vaccine gaps: Shingrix, TDAP -Low B12 (204) - started B12 oral supplement a few weeks ago and reports improvement in energy  Patient Goals/Self-Care Activities Patient will:  - take medications as prescribed as evidenced by patient report and record review focus on medication adherence by routine Contact PCP for mood medication if desired       Patient verbalizes understanding of instructions and care plan provided today and agrees to view in [place]. Active MyChart status and patient understanding of how to access instructions and care plan via MyChart confirmed with patient.    Telephone follow up appointment with pharmacy team member scheduled for: 3 months  [name], PharmD, [facility] Clinical Pharmacist [facility] at [facility] [phone]

## 2021-07-28 DIAGNOSIS — J9611 Chronic respiratory failure with hypoxia: Secondary | ICD-10-CM | POA: Diagnosis not present

## 2021-08-03 ENCOUNTER — Telehealth: Payer: Self-pay

## 2021-08-03 NOTE — Chronic Care Management (AMB) (Signed)
Contacted the patient to see how her mood was. Daughter states the patient is doing about the same, off the benedryl at night, sleep is still disturbed. Discussed the medication she can use if interested and they will call us if needed. The patient is off Mirtazipine and also is not using her O2 at night.    [name], CPP notified  [name], [place]  [phone]

## 2021-08-04 ENCOUNTER — Other Ambulatory Visit: Payer: Medicare Other | Admitting: Internal Medicine

## 2021-08-04 ENCOUNTER — Encounter: Payer: Self-pay | Admitting: Internal Medicine

## 2021-08-04 VITALS — BP 130/76 | HR 77 | Temp 97.7°F | Resp 18

## 2021-08-04 DIAGNOSIS — Z515 Encounter for palliative care: Secondary | ICD-10-CM

## 2021-08-04 DIAGNOSIS — J432 Centrilobular emphysema: Secondary | ICD-10-CM | POA: Diagnosis not present

## 2021-08-04 DIAGNOSIS — J9611 Chronic respiratory failure with hypoxia: Secondary | ICD-10-CM

## 2021-08-04 DIAGNOSIS — E538 Deficiency of other specified B group vitamins: Secondary | ICD-10-CM | POA: Diagnosis not present

## 2021-08-04 DIAGNOSIS — F321 Major depressive disorder, single episode, moderate: Secondary | ICD-10-CM

## 2021-08-04 NOTE — Progress Notes (Addendum)
[facility] Visit Telephone: [phone]  Fax: [phone]   Date of encounter: 08/04/21 10:43 AM PATIENT NAME: Megan Peterson [id number]   [phone] (home)  DOB: August 30, 1940 MRN: [id number] PRIMARY CARE PROVIDER:    Tonia Ghent, MD,  Megan Peterson [id number] [phone]  REFERRING PROVIDER:   Tonia Ghent, MD [address] [place],  [place] [address] [phone]  RESPONSIBLE PARTY:    Contact Information     Name Relation Home Work Mobile   [name] Daughter   [phone]      I met face to face with patient and family in her home. Palliative Care was asked to follow this patient by consultation request of  Megan Ghent, MD to address advance care planning and complex medical decision making. This is follow-up visit.                                     ASSESSMENT AND PLAN / RECOMMENDATIONS:   Advance Care Planning/Goals of Care: Goals include to maximize quality of life and symptom management. Patient/health care surrogate gave his/her permission to discuss.Our advance care planning conversation included a discussion about:    The value and importance of advance care planning  Experiences with loved ones who have been seriously ill or have died  Exploration of personal, cultural or spiritual beliefs that might influence medical decisions  Exploration of goals of care in the event of a sudden injury or illness  Identification  of a healthcare agent  Review and updating or creation of an  advance directive document . Decision not to resuscitate or to de-escalate disease focused treatments due to poor prognosis. CODE STATUS:  MOST form with full code--ok for CPR trial, no ventilator, no ICU, limited additional interventions, abx case by case, IVF trial, no tube feeding; pt agreed with CPR when her daughter said she would not be able to stand  by if it were needed.  Goal to avoid hospitalizations if possible, but would still go back if quite ill and expected to improve.   Staying with her daughter, but has home that she rented they need to go through.  Needs supervision at home.  Forgets to where oxygen.    Symptom Management/Plan: 1. Depression, major, single episode, moderate ([facility]) -PCP did not agree with trying high-dose remeron, pt about the same with good and bad days--today was a good one and she actually was talking and joking some; will not add medication at this time  2. Chronic respiratory failure with hypoxia (H[facility]) -continue current oxygen therapy--pt often nonadherent--takes off and forgets to put back on when not in company of daughter -sats satisfactory today with 2L at 93%  3. Centrilobular emphysema (HCC) -continue albuterol nebs up to 4 times per day (not using regularly), albuterol HFA and anoro still in use, monitor, encouraged regular O2 with sats just 93 when on 2L  4. B12 deficiency -continues on b12 supplement for this in hopes energy will improve--so far Megan Peterson does not note any significant difference (did have low level)  5. Palliative care by specialist -provided education about caregiver support options, home caregivers being out of pocket with rare exceptions (medicaid or specialty MA plans) -reviewed role of palliative, who our team consists of, overall [facility] collective role, and reviewed ACP docs, completed MOST with pt  and daughter today which will be uploaded to epic/vynca--to be reviewed at least annually and with any significant decline -will follow over time  -will ask social work to connect with Megan Peterson, pt's daughter, to provide connections to some caregiving agencies she could pay for basic ADLs and to spend time with pt, keep her more active and stimulated mentally    Follow up Palliative Care Visit: Palliative care will continue to follow for complex medical decision making, advance care  planning, and clarification of goals. Return 11/08/2021 and prn.  This visit was coded based on medical decision making (MDM).  38 minutes spent on ACP.  PPS: weak 50%  HOSPICE ELIGIBILITY/DIAGNOSIS: TBD  Chief Complaint: Follow-up palliative visit  HISTORY OF PRESENT ILLNESS:  Megan Peterson is a 81 y.o. year old female  with COPD, chronic respiratory failure with hypoxia, chronic back pain,   She doesn't like to take a bath b/c she has to get naked in front of [facility].  She likes it hot in the bathroom--they get it heated up.  Problems with the temp, doesn't like water in her face.  Sits on shower chair and has to stand to wash her private parts holding a grab bar.  Prefers to wash at sink.  Only getting real bath twice a month.    Sometimes, she has very soiled underwear she washes out in sink and lay them on the side of the tub or sink--sometimes odd places.   Uses pads.  Has almost daily to qod incontinence of stool.    She has a lot of medication.  It takes 2-3 hrs for the pillbox to be filled by Megan Peterson.  Megan Peterson is constantly worried where Megan Peterson is if she goes to use the bathroom or do her own things.  Uses 2L O2 that she doesn't use much of.  Will not sleep with the oxygen on despite all doctors and daughter's encouragement.  Says if she dies during the night, she dies.  They always bring the portable O2 with her now wherever she goes.  She takes the wheelchair, but it's falling apart and they need a new one but they're wanting them to rent it.  She is not able to walk any long distances in heat or wind.  Only walks a bit room to room in the house.  Sleeps in her recliner due to back discomfort if sleeps in bed.    History obtained from review of EMR, discussion with primary team, and interview with family, facility staff/caregiver and/or Megan Peterson.   I reviewed available labs, medications, imaging, studies and related documents from the EMR.  Records reviewed and summarized above.    ROS Review of Systems  Constitutional:  Positive for fatigue. Negative for activity change, appetite change, chills and fever.  HENT:  Negative for trouble swallowing.   Respiratory:  Positive for shortness of breath and wheezing. Negative for chest tightness.   Cardiovascular:  Negative for chest pain, palpitations and leg swelling.  Gastrointestinal:  Negative for abdominal pain and constipation.  Genitourinary:  Negative for dysuria.  Musculoskeletal:  Positive for arthralgias and back pain.  Neurological:  Positive for weakness. Negative for dizziness.  Psychiatric/Behavioral:  Positive for confusion and dysphoric mood.     Physical Exam:  last weight 140 lbs Vitals:   08/04/21 1042  BP: 130/76  Peterson: 77  Resp: 18  Temp: 97.7 F (36.5 C)  SpO2: 93%   There is no height or weight on file to calculate  BMI. Wt Readings from Last 500 Encounters:  06/27/21 140 lb (63.5 kg)  06/16/21 140 lb (63.5 kg)  05/11/21 146 lb 3.2 oz (66.3 kg)  03/21/21 142 lb (64.4 kg)  02/18/21 139 lb 12.8 oz (63.4 kg)  02/04/21 142 lb (64.4 kg)  01/07/21 138 lb (62.6 kg)  12/30/20 137 lb (62.1 kg)  10/28/20 133 lb (60.3 kg)  09/07/20 145 lb 15.1 oz (66.2 kg)  08/11/20 133 lb 3.2 oz (60.4 kg)  06/24/20 132 lb (59.9 kg)  03/29/20 134 lb (60.8 kg)  03/17/20 132 lb 6.4 oz (60.1 kg)  12/16/19 135 lb 6.4 oz (61.4 kg)  11/04/19 132 lb 9.6 oz (60.1 kg)  10/02/19 134 lb 7 oz (61 kg)  09/18/19 133 lb (60.3 kg)  07/29/19 131 lb 9.6 oz (59.7 kg)  04/22/19 130 lb 4 oz (59.1 kg)  02/27/19 127 lb 2 oz (57.7 kg)  02/04/19 130 lb (59 kg)  01/09/19 130 lb 1 oz (59 kg)  11/19/18 128 lb 9 oz (58.3 kg)  10/03/18 124 lb 8 oz (56.5 kg)  05/17/18 123 lb 7 oz (56 kg)  04/02/18 123 lb 9 oz (56 kg)  02/26/18 122 lb 8 oz (55.6 kg)  01/04/18 123 lb 8 oz (56 kg)  11/16/17 124 lb 8 oz (56.5 kg)  11/06/17 124 lb 12 oz (56.6 kg)  08/02/17 122 lb 12 oz (55.7 kg)  06/21/17 125 lb 4 oz (56.8 kg)  04/13/17 123 lb  (55.8 kg)  03/20/17 130 lb 8 oz (59.2 kg)  12/18/16 128 lb 4 oz (58.2 kg)  11/13/16 125 lb (56.7 kg)  10/24/16 126 lb 4 oz (57.3 kg)  10/18/16 126 lb (57.2 kg)  10/17/16 126 lb (57.2 kg)  08/22/16 125 lb 14.1 oz (57.1 kg)  08/17/16 127 lb 3.3 oz (57.7 kg)  08/15/16 127 lb 3.3 oz (57.7 kg)  08/10/16 126 lb 15.8 oz (57.6 kg)  07/28/16 128 lb (58.1 kg)  07/25/16 127 lb (57.6 kg)  07/25/16 127 lb 6.8 oz (57.8 kg)  07/20/16 125 lb 14.1 oz (57.1 kg)  07/18/16 127 lb 3.3 oz (57.7 kg)  07/14/16 125 lb 12.8 oz (57.1 kg)  07/10/16 126 lb (57.2 kg)  07/06/16 125 lb 3.5 oz (56.8 kg)  07/04/16 127 lb 6.8 oz (57.8 kg)  06/29/16 127 lb 10.3 oz (57.9 kg)  06/27/16 126 lb 12.2 oz (57.5 kg)  06/22/16 127 lb 13.9 oz (58 kg)  06/20/16 127 lb 13.9 oz (58 kg)  06/15/16 127 lb 3.3 oz (57.7 kg)  06/13/16 126 lb 12.2 oz (57.5 kg)  06/08/16 127 lb 6.8 oz (57.8 kg)  06/06/16 127 lb 10.3 oz (57.9 kg)  06/01/16 128 lb 1.4 oz (58.1 kg)  05/30/16 127 lb 3.3 oz (57.7 kg)  05/25/16 125 lb 14.1 oz (57.1 kg)  05/23/16 126 lb 12.2 oz (57.5 kg)  05/18/16 124 lb 12.5 oz (56.6 kg)  05/16/16 125 lb 7.1 oz (56.9 kg)  05/08/16 126 lb 8.7 oz (57.4 kg)  05/01/16 125 lb 8 oz (56.9 kg)  04/25/16 123 lb (55.8 kg)  04/21/16 129 lb 14.4 oz (58.9 kg)  04/11/16 123 lb 8 oz (56 kg)  04/11/16 123 lb 8 oz (56 kg)  03/27/16 122 lb 8 oz (55.6 kg)  03/23/16 119 lb 1.6 oz (54 kg)  09/27/15 120 lb 8 oz (54.7 kg)  02/25/15 121 lb 8 oz (55.1 kg)  04/20/14 119 lb (54 kg)  03/13/14 117 lb 4 oz (53.2 kg)  02/02/14 119 lb 8 oz (54.2 kg)  12/12/13 121 lb 8 oz (55.1 kg)  10/15/13 126 lb 8 oz (57.4 kg)  06/10/13 127 lb 8 oz (57.8 kg)  03/11/13 126 lb 4 oz (57.3 kg)  03/19/12 126 lb 6.4 oz (57.3 kg)  09/06/11 122 lb (55.3 kg)  06/05/11 124 lb (56.2 kg)  06/27/10 130 lb 8 oz (59.2 kg)  06/18/06 123 lb 2.1 oz (55.9 kg)   Physical Exam Constitutional:      General: She is not in acute distress.    Appearance: She is obese.   HENT:     Head: Normocephalic and atraumatic.  Cardiovascular:     Rate and Rhythm: Normal rate and regular rhythm.  Pulmonary:     Effort: Pulmonary effort is normal.     Breath sounds: Normal breath sounds. No wheezing.  Abdominal:     General: Bowel sounds are normal. There is no distension.     Palpations: Abdomen is soft.     Tenderness: There is no abdominal tenderness. There is no guarding or rebound.  Musculoskeletal:        General: Normal range of motion.     Right lower leg: No edema.     Left lower leg: No edema.  Skin:    General: Skin is warm and dry.     Coloration: Skin is pale.  Neurological:     General: No focal deficit present.     Mental Status: She is alert and oriented to person, place, and time.     CURRENT PROBLEM LIST:  Patient Active Problem List   Diagnosis Date Noted   B12 deficiency 06/29/2021   Dyspnea 03/23/2021   Fecal incontinence 01/09/2021   Medication management 07/29/2019   Joint pain 04/23/2019   Memory loss 10/06/2018   Neck pain 04/02/2018   Rash 11/18/2017   Bursitis 08/03/2017   Chronic back pain 12/19/2016   Health care maintenance 10/18/2016   Hypertension 10/18/2016   TIA (transient ischemic attack) 10/18/2016   Radicular leg pain 07/30/2016   Left knee pain 07/10/2016   Tachycardia 05/01/2016   Chronic respiratory failure with hypoxia ([facility]) 04/21/2016   COPD with acute exacerbation ([facility]) 04/21/2016   Bronchiectasis ([facility])    Encounter for chronic pain management 01/18/2016   Vitamin D deficiency 02/25/2015   Osteopenia 01/02/2014   Medicare annual wellness visit, subsequent 12/12/2013   Advanced care planning/counseling discussion 12/12/2013   Right carotid bruit 12/12/2013   Generalized anxiety disorder    COPD (chronic obstructive pulmonary disease) with emphysema ([facility]) 06/05/2011   Chest pain 05/16/2007   HLD (hyperlipidemia) 09/26/2006    PAST MEDICAL HISTORY:  Active Ambulatory Problems    Diagnosis Date  Noted   HLD (hyperlipidemia) 09/26/2006   Chest pain 05/16/2007   COPD (chronic obstructive pulmonary disease) with emphysema ([facility]) 06/05/2011   Generalized anxiety disorder    Medicare annual wellness visit, subsequent 12/12/2013   Advanced care planning/counseling discussion 12/12/2013   Right carotid bruit 12/12/2013   Osteopenia 01/02/2014   Vitamin D deficiency 02/25/2015   Encounter for chronic pain management 01/18/2016   Bronchiectasis ([facility])    Chronic respiratory failure with hypoxia ([facility]) 04/21/2016   COPD with acute exacerbation ([facility]) 04/21/2016   Tachycardia 05/01/2016   Left knee pain 07/10/2016   Radicular leg pain 07/30/2016   Health care maintenance 10/18/2016   Hypertension 10/18/2016   TIA (transient ischemic attack) 10/18/2016   Chronic back pain 12/19/2016   Bursitis 08/03/2017   Rash  11/18/2017   Neck pain 04/02/2018   Memory loss 10/06/2018   Joint pain 04/23/2019   Medication management 07/29/2019   Fecal incontinence 01/09/2021   Dyspnea 03/23/2021   B12 deficiency 06/29/2021   Resolved Ambulatory Problems    Diagnosis Date Noted   Chronic lower back pain 09/26/2006   TOBACCO ABUSE, HX OF 02/15/2009   HYSTERECTOMY, HX OF 09/26/2006   Routine health maintenance 06/06/2011   Ex-smoker    Abnormal drug screen 10/02/2013   Acute respiratory failure ([facility]) 03/19/2016   Influenza 03/19/2016   Sepsis ([facility]) 04/21/2016   HCAP (healthcare-associated pneumonia) 04/21/2016   Pain medication agreement signed 12/19/2016   Past Medical History:  Diagnosis Date   Anxiety disorder 1997   Back pain, chronic    COPD (chronic obstructive pulmonary disease) ([facility])    Detached retina, left Aug '12   Dislocated shoulder april '12   Hyperlipidemia    Squamous cell skin cancer, chin Spring '12    SOCIAL HX:  Social History   Tobacco Use   Smoking status: Former    Years: 40.00    Types: Cigarettes    Quit date: 01/03/1991    Years since quitting: 30.6    Smokeless tobacco: Never  Substance Use Topics   Alcohol use: No     ALLERGIES:  Allergies  Allergen Reactions   Buspar [Buspirone] Other (See Comments)    Dizzy and nauseated.   Cymbalta [Duloxetine Hcl] Other (See Comments)    fatigue   Lexapro [Escitalopram Oxalate] Other (See Comments)    Per patient, intolerant   Meperidine Hcl Other (See Comments)    unknown   Morphine And Related Other (See Comments)    Vomiting    Statins Other (See Comments)    Myalgias on statins      PERTINENT MEDICATIONS:  Outpatient Encounter Medications as of 08/04/2021  Medication Sig   albuterol (ACCUNEB) 0.63 MG/3ML nebulizer solution Take 3 mLs (0.63 mg total) by nebulization every 6 (six) hours as needed for wheezing.   albuterol (VENTOLIN HFA) 108 (90 Base) MCG/ACT inhaler INHALE 1-2 PUFFS INTO THE LUNGS EVERY 6 HOURS AS NEEDED FOR WHEEZING OR SHORTNESS OF BREATH   ANORO ELLIPTA 62.5-25 MCG/ACT AEPB Inhale 1 puff by mouth once daily   furosemide (LASIX) 20 MG tablet TAKE 1 TABLET BY MOUTH ONCE DAILY AS NEEDED IN THE MORNING FOR FLUID   gabapentin (NEURONTIN) 800 MG tablet Take 1 tablet (800 mg total) by mouth 3 (three) times daily.   HYDROcodone-acetaminophen (NORCO/VICODIN) 5-325 MG tablet Take 1 tablet by mouth every 6 (six) hours as needed for moderate pain.   Ibuprofen-diphenhydrAMINE HCl (ADVIL PM) 200-25 MG CAPS Take 1 tablet by mouth at bedtime.   memantine (NAMENDA) 10 MG tablet 1 TABLET BY MOUTH TWICEDAILY THEREAFTER.   metoprolol succinate (TOPROL-XL) 25 MG 24 hr tablet Take 1 tablet (25 mg total) by mouth daily.   nitroGLYCERIN (NITROSTAT) 0.4 MG SL tablet Place 1 tablet (0.4 mg total) under the tongue every 5 (five) minutes as needed for chest pain (max 3 doses in 15 minutes.  if still having chest pain then go to the ER). (Patient not taking: Reported on 07/18/2021)   ondansetron (ZOFRAN) 4 MG tablet Take 4 mg by mouth every 8 (eight) hours as needed for nausea or vomiting. (Patient  not taking: Reported on 07/18/2021)   OXYGEN Inhale 2 L into the lungs at bedtime.   triamcinolone cream (KENALOG) 0.5 % Apply 1 application topically 2 (two) times daily.  Put on rash on the legs twice a day   vitamin B-12 (CYANOCOBALAMIN) 1000 MCG tablet Take 1 tablet (1,000 mcg total) by mouth daily.   No facility-administered encounter medications on file as of 08/04/2021.    Thank you for the opportunity to participate in the care of [name].  The palliative care team will continue to follow. Please call our office at [phone] if we can be of additional assistance.   Hollace Kinnier, DO  COVID-19 PATIENT SCREENING TOOL Asked and negative response unless otherwise noted:  Have you had symptoms of covid, tested positive or been in contact with someone with symptoms/positive test in the past 5-10 days? No

## 2021-08-08 DIAGNOSIS — J9611 Chronic respiratory failure with hypoxia: Secondary | ICD-10-CM | POA: Diagnosis not present

## 2021-08-15 ENCOUNTER — Telehealth: Payer: Self-pay

## 2021-08-15 DIAGNOSIS — Z515 Encounter for palliative care: Secondary | ICD-10-CM

## 2021-08-15 NOTE — Telephone Encounter (Signed)
(  3:13 pm) SW completed a follow-up call with patient's daughter to provide support and resources to her. The daughter was interested in in-home caregiver support and a day program.

## 2021-08-19 ENCOUNTER — Other Ambulatory Visit: Payer: Self-pay | Admitting: Family Medicine

## 2021-08-19 ENCOUNTER — Other Ambulatory Visit: Payer: Self-pay

## 2021-08-19 DIAGNOSIS — Z515 Encounter for palliative care: Secondary | ICD-10-CM

## 2021-08-19 NOTE — Telephone Encounter (Signed)
Last filled 07-04-21 #120 Last OV 06-27-21 No future OV [facility]

## 2021-08-19 NOTE — Telephone Encounter (Signed)
  Encourage patient to contact the pharmacy for refills or they can request refills through [facility]  Did the patient contact the pharmacy:  Y   LAST APPOINTMENT DATE:  6.26.23  NEXT APPOINTMENT DATE: not scheduled  MEDICATION: HYDROcodone-acetaminophen (NORCO/VICODIN) 5-325 MG tablet   albuterol (VENTOLIN HFA) 108 (90 Base) MCG/ACT inhaler   (last filled at [facility], please move to [facility]  Is the patient out of medication? N  If not, how much is left?  Only has 20 pills left of Hydrocodone (takes minimum 3 per day, and one additional if in lot of pain)   Is this a 90 day supply:   PHARMACY:   [facility] ([facility]), [place] Phone:  [phone]  Fax:  [phone]

## 2021-08-19 NOTE — Progress Notes (Signed)
(  12:15 pm) PC SW completed a return call to patient's daughter-[name]. [name] advised that when patient got up this morning she was weak, extremely fatigued, nausea and having difficulty ambulating and taking medication. As of this phone call, patient's condition has improved where she has taken her medication, she ate her breakfast and patient report feeling much better. SW asked [name] if there were any nursing or social work needs at this time and she did not have any. SW advised [name] that she will updated Dr. Magdalene Molly. SW also advised [name] to call back with any additional questions or concerns.

## 2021-08-21 DIAGNOSIS — J9611 Chronic respiratory failure with hypoxia: Secondary | ICD-10-CM | POA: Diagnosis not present

## 2021-08-21 MED ORDER — HYDROCODONE-ACETAMINOPHEN 5-325 MG PO TABS: 1.0000 | ORAL_TABLET | Freq: Four times a day (QID) | ORAL | 0 refills | Status: DC | PRN

## 2021-08-21 MED ORDER — ALBUTEROL SULFATE HFA 108 (90 BASE) MCG/ACT IN AERS: INHALATION_SPRAY | RESPIRATORY_TRACT | 5 refills | Status: DC

## 2021-08-21 NOTE — Telephone Encounter (Signed)
Hydrocodone last filled 07/09/2021.  Prescription sent for 30 day supply.

## 2021-08-23 ENCOUNTER — Other Ambulatory Visit: Payer: Medicare Other

## 2021-08-23 DIAGNOSIS — Z515 Encounter for palliative care: Secondary | ICD-10-CM

## 2021-08-23 NOTE — Progress Notes (Signed)
COMMUNITY PALLIATIVE CARE SW NOTE  PATIENT NAME: Megan Peterson DOB: [birth year] MRN: [id number]  PRIMARY CARE PROVIDER: Tonia Ghent, MD  RESPONSIBLE PARTY:  Acct ID - Guarantor Home Phone Work Phone Relationship Acct Type  [id number] CHIANNE, BYRNS [phone]  Self P/F     [place], [place], [place] [address]   SOCIAL WORK TELEPHONIC ENCOUNTER  PC SW completed a follow-up telephonic visit with patient's daughter-[name] to assess patient status. Her daughter advised that patient was feeling much better and seemed to be back at her baseline. She felt that patient had a 24 hour bug, but was glad she was doing better . [name] advised that she will be going out of town 8/23-8/27/23 and patient will be with her brother in [place], [place]. [name] advised that she wanted palliative to be aware in case her brother calls in with questions. SW reminded her that because of patient's location, triage maybe available, but SW was not sure if an in-person visit will be done in that location.  SW encouraged her and her brother to call with any additional questions or concerns.    [address] [place], [place]

## 2021-08-28 DIAGNOSIS — J9611 Chronic respiratory failure with hypoxia: Secondary | ICD-10-CM | POA: Diagnosis not present

## 2021-09-03 ENCOUNTER — Other Ambulatory Visit: Payer: Self-pay | Admitting: Neurology

## 2021-09-06 NOTE — Telephone Encounter (Signed)
Enough given until 09/27/2021

## 2021-09-08 DIAGNOSIS — J9611 Chronic respiratory failure with hypoxia: Secondary | ICD-10-CM | POA: Diagnosis not present

## 2021-09-13 ENCOUNTER — Other Ambulatory Visit: Payer: Self-pay | Admitting: Family Medicine

## 2021-09-21 DIAGNOSIS — J432 Centrilobular emphysema: Secondary | ICD-10-CM | POA: Diagnosis not present

## 2021-09-21 DIAGNOSIS — J9611 Chronic respiratory failure with hypoxia: Secondary | ICD-10-CM | POA: Diagnosis not present

## 2021-09-22 ENCOUNTER — Telehealth: Payer: Self-pay | Admitting: Family Medicine

## 2021-09-22 NOTE — Telephone Encounter (Signed)
  Encourage patient to contact the pharmacy for refills or they can request refills through [facility]  Did the patient contact the pharmacy:  Yes  LAST APPOINTMENT DATE: 06/27/2021  NEXT APPOINTMENT DATE: N/A  MEDICATION: HYDROcodone-acetaminophen (NORCO/VICODIN) 5-325 MG tablet  Is the patient out of medication? No  If not, how much is left? A few  PHARMACY: [facility] ([facility]), [place] [facility] [place]  Let patient know to contact pharmacy at the end of the day to make sure medication is ready.  Please notify patient to allow 48-72 hours to process

## 2021-09-22 NOTE — Telephone Encounter (Signed)
LOV - 06/27/21 NOV - NA  RF - 08/21/21 #120/0

## 2021-09-23 MED ORDER — HYDROCODONE-ACETAMINOPHEN 5-325 MG PO TABS: 1.0000 | ORAL_TABLET | Freq: Four times a day (QID) | ORAL | 0 refills | Status: DC | PRN

## 2021-09-23 NOTE — Addendum Note (Signed)
Addended by: Tonia Ghent on: 09/23/2021 07:13 AM   Modules accepted: Orders
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
## 2021-09-28 DIAGNOSIS — J9611 Chronic respiratory failure with hypoxia: Secondary | ICD-10-CM | POA: Diagnosis not present

## 2021-09-29 ENCOUNTER — Encounter: Payer: Self-pay | Admitting: Neurology

## 2021-09-29 ENCOUNTER — Ambulatory Visit: Payer: Medicare Other | Admitting: Neurology

## 2021-09-29 VITALS — BP 116/68 | HR 73 | Ht 59.0 in | Wt 140.0 lb

## 2021-09-29 DIAGNOSIS — G309 Alzheimer's disease, unspecified: Secondary | ICD-10-CM

## 2021-09-29 DIAGNOSIS — F0280 Dementia in other diseases classified elsewhere without behavioral disturbance: Secondary | ICD-10-CM | POA: Diagnosis not present

## 2021-09-29 NOTE — Patient Instructions (Addendum)
Verify which medications you are taking and let me know (donepezil or memantine) Follow up with Sharene Butters, PA-C in memory clinic in 1 year

## 2021-10-03 ENCOUNTER — Encounter: Payer: Self-pay | Admitting: Neurology

## 2021-10-04 ENCOUNTER — Other Ambulatory Visit: Payer: Self-pay | Admitting: Neurology

## 2021-10-08 DIAGNOSIS — J9611 Chronic respiratory failure with hypoxia: Secondary | ICD-10-CM | POA: Diagnosis not present

## 2021-10-20 ENCOUNTER — Telehealth: Payer: Self-pay

## 2021-10-20 NOTE — Chronic Care Management (AMB) (Signed)
    Chronic Care Management Pharmacy Assistant   Name: Megan Peterson  MRN: [id number] DOB: [birth year]  Reason for Encounter: Reminder Call  Medications: Outpatient Encounter Medications as of 10/20/2021  Medication Sig   albuterol (ACCUNEB) 0.63 MG/3ML nebulizer solution Take 3 mLs (0.63 mg total) by nebulization every 6 (six) hours as needed for wheezing.   albuterol (VENTOLIN HFA) 108 (90 Base) MCG/ACT inhaler INHALE 1-2 PUFFS INTO THE LUNGS EVERY 6 HOURS AS NEEDED FOR WHEEZING OR SHORTNESS OF BREATH   ANORO ELLIPTA 62.5-25 MCG/ACT AEPB Inhale 1 puff by mouth once daily   furosemide (LASIX) 20 MG tablet TAKE 1 TABLET BY MOUTH ONCE DAILY AS NEEDED IN THE MORNING FOR FLUID   gabapentin (NEURONTIN) 800 MG tablet Take 1 tablet (800 mg total) by mouth 3 (three) times daily.   HYDROcodone-acetaminophen (NORCO/VICODIN) 5-325 MG tablet Take 1 tablet by mouth every 6 (six) hours as needed for moderate pain.   Ibuprofen-diphenhydrAMINE HCl (ADVIL PM) 200-25 MG CAPS Take 1 tablet by mouth at bedtime.   memantine (NAMENDA) 10 MG tablet Take 1 tablet by mouth twice daily   metoprolol succinate (TOPROL-XL) 25 MG 24 hr tablet Take 1 tablet by mouth once daily   nitroGLYCERIN (NITROSTAT) 0.4 MG SL tablet Place 0.4 mg under the tongue every 5 (five) minutes as needed for chest pain (max 3 doses in 15 minutes.  if still having chest pain then go to the ER).   ondansetron (ZOFRAN) 4 MG tablet Take 4 mg by mouth every 8 (eight) hours as needed for nausea or vomiting.   OXYGEN Inhale 2 L into the lungs at bedtime.   triamcinolone cream (KENALOG) 0.5 % APPLY  CREAM TOPICALLY TO AFFECTED AREA TWICE DAILY FOR  RASH  ON  LEGS   vitamin B-12 (CYANOCOBALAMIN) 1000 MCG tablet Take 1 tablet (1,000 mcg total) by mouth daily.   No facility-administered encounter medications on file as of 10/20/2021.   Megan Peterson was contacted to remind of upcoming telephone visit with Megan Peterson on 10/25/21 at 3:00. Patient  was reminded to have any blood glucose and blood pressure readings available for review at appointment.   Patient confirmed appointment. Daughter Megan Peterson confirmed appointment. Daughter requests a second call after mothers assessment is complete as she will need privacy to speak with CPP.  Are you having any problems with your medications? Yes  Daughter reports some issues with medications   Do you have any concerns you like to discuss with the pharmacist? No  CCM referral has been placed prior to visit?  No   Star Rating Drugs: Medication:  Last Fill: Day Supply No star medications identified  Megan Peterson, CPP notified  Megan Peterson, [place]  [phone]

## 2021-10-21 DIAGNOSIS — J432 Centrilobular emphysema: Secondary | ICD-10-CM | POA: Diagnosis not present

## 2021-10-21 DIAGNOSIS — J9611 Chronic respiratory failure with hypoxia: Secondary | ICD-10-CM | POA: Diagnosis not present

## 2021-10-25 ENCOUNTER — Ambulatory Visit: Payer: Medicare Other | Admitting: Pharmacist

## 2021-10-25 DIAGNOSIS — I10 Essential (primary) hypertension: Secondary | ICD-10-CM

## 2021-10-25 DIAGNOSIS — E785 Hyperlipidemia, unspecified: Secondary | ICD-10-CM

## 2021-10-25 DIAGNOSIS — R413 Other amnesia: Secondary | ICD-10-CM

## 2021-10-25 DIAGNOSIS — F411 Generalized anxiety disorder: Secondary | ICD-10-CM

## 2021-10-25 NOTE — Progress Notes (Signed)
Chronic Care Management Pharmacy Note  10/25/2021 Name:  Megan Peterson MRN:  [id number] DOB:  04/17/1940  Summary: CCM F/U visit -Reviewed medications; daughter Megan Peterson administers each day and adherence appears improved -Family reports patient's irritable mood is getting worse, unclear if depression is contributing but it is getting very difficult for family to deal with; pt has tried multiple antidepressants including mirtazapine most recently; family requests medication to help with mood for family's benefit as well as patient's; discussed options are limited as antipsychotics, benzodiazepines and anticholinergics (hydroxyzine) should be avoided as risks outweigh benefits; SSRI may be helpful but will take 4-6 weeks for effect, daughter Megan Peterson is adamant that they need to try something  Recommendations/Changes made from today's visit: -Recommend trial of sertraline 25 mg daily  Plan: -[place] will call patient 1 month to check on mood -Pharmacist follow up televisit scheduled for 3 months -Palliative care home visit 11/08/21    Subjective: Megan Peterson is an 81 y.o. year old female who is a primary patient of Damita Dunnings, [name], MD.  The CCM team was consulted for assistance with disease management and care coordination needs.    Engaged with patient by telephone for follow up visit in response to provider referral for pharmacy case management and/or care coordination services.   Consent to Services:  The patient was given information about Chronic Care Management services, agreed to services, and gave verbal consent prior to initiation of services.  Please see initial visit note for detailed documentation.   Patient Care Team: Tonia Ghent, MD as PCP - General (Family Medicine) Druscilla Brownie, MD (Dermatology) Birder Robson, MD as Referring Physician (Ophthalmology) Charlton Haws, [facility] as Pharmacist (Pharmacist) Gayland Curry, DO (Geriatric  Medicine)  Recent office visits: 06/27/21-Graham Duncan,MD(PCP)- memory Radene Gunning lipitor,triamcinolone for rash,Would hold mirtazapine for now.Labs ordered(Urinalysis was abnormal but urine culture is negative.TSH is normal.  B12 is low.  Sugar kidney and liver tests are stable.  Blood counts are fine. 03/21/21-Graham Duncan,MD(PCP)-Telemedicine f/u dyspnea, hold lipitor for 10 days,reasonable for trial of lasix for a few days and see if SOBS and edema improve. 02/04/21-Graham Duncan,MD(PCP)-f/u chest pain,labs ordered(Potassium is elevated.  Would take sodium zirconium cyclosilicate 49Q daily on today and tomorrow-lipids elevated and that was expected off statin.Other labs are okay,referral for cardiology.  Recent consult visits: 09/29/21 Dr Tomi Likens (Neurology): Alzheimers - stable. F/u 1 year.  08/04/21 Dr Mariea Clonts [facility]): home visit - depression w/ good and bad days. Ask SW to connect Megan Peterson for caregiving agencies.  06/13/21-Tiffany Reed,DO(geriatric)-Telemedicine,start mirtazapine 7.80m take 1 tablet at bedtime,continue O2 2L,f/u 2 months  05/11/21-Rakesh Alva,MD (Pulmonology)-f/u COPD,provide portable O2,provide nebulizer and albuterol for emergency.Referral for palliative care, f/u 6 months 02/18/21-McLean O'Neal,MD(cardio)-chest pain,EKG,labs,schedule CTA,repeat echo. Mild CAD on scan - start atorvastatin 20 mg.  Hospital visits: None in previous 6 months   Objective:  Lab Results  Component Value Date   CREATININE 1.18 06/27/2021   BUN 23 06/27/2021   GFR 43.77 (L) 06/27/2021   EGFR 49 (L) 02/18/2021   GFRNONAA >60 09/07/2020   GFRAA 58 (L) 02/04/2019   NA 140 06/27/2021   K 4.4 06/27/2021   CALCIUM 9.6 06/27/2021   CO2 31 06/27/2021   GLUCOSE 82 06/27/2021    Lab Results  Component Value Date/Time   GFR 43.77 (L) 06/27/2021 03:29 PM   GFR 42.59 (L) 02/04/2021 11:52 AM    Last diabetic Eye exam: No results found for: "HMDIABEYEEXA"  Last diabetic Foot exam:  No results  found for: "HMDIABFOOTEX"   Lab Results  Component Value Date   CHOL 266 (H) 02/04/2021   HDL 52.70 02/04/2021   LDLCALC 175 (H) 02/04/2021   LDLDIRECT 143.1 05/20/2012   TRIG 188.0 (H) 02/04/2021   CHOLHDL 5 02/04/2021       Latest Ref Rng & Units 06/27/2021    3:29 PM 02/04/2021   11:52 AM 12/16/2019    9:20 AM  Hepatic Function  Total Protein 6.0 - 8.3 g/dL 7.0  6.8  6.7   Albumin 3.5 - 5.2 g/dL 4.2  4.2  4.1   AST 0 - 37 U/L '19  18  23   ' ALT 0 - 35 U/L '10  9  12   ' Alk Phosphatase 39 - 117 U/L 107  105  98   Total Bilirubin 0.2 - 1.2 mg/dL 0.5  0.4  0.4     Lab Results  Component Value Date/Time   TSH 2.28 06/27/2021 03:29 PM   TSH 2.66 02/04/2021 11:52 AM       Latest Ref Rng & Units 06/27/2021    3:29 PM 02/04/2021   11:52 AM 09/07/2020   11:31 AM  CBC  WBC 4.0 - 10.5 K/uL 9.0  8.4  10.1   Hemoglobin 12.0 - 15.0 g/dL 13.1  12.4  14.2   Hematocrit 36.0 - 46.0 % 39.2  38.2  40.9   Platelets 150.0 - 400.0 K/uL 247.0  294.0  295     Lab Results  Component Value Date/Time   VD25OH 34.83 11/12/2018 07:54 AM   VD25OH 22.30 (L) 04/13/2017 12:52 PM    Clinical ASCVD: Yes  The ASCVD Risk score (Arnett DK, et al., 2019) failed to calculate for the following reasons:   The 2019 ASCVD risk score is only valid for ages 48 to 2       06/16/2021    3:51 PM 12/16/2019    8:31 AM 11/18/2018    1:58 PM  Depression screen PHQ 2/9  Decreased Interest 3 0 0  Down, Depressed, Hopeless 3 0 0  PHQ - 2 Score 6 0 0  Altered sleeping 0  0  Tired, decreased energy 1  0  Change in appetite 0  0  Feeling bad or failure about yourself  0  0  Trouble concentrating 0  0  Moving slowly or fidgety/restless 0  0  Suicidal thoughts 0  0  PHQ-9 Score 7  0  Difficult doing work/chores Somewhat difficult  Not difficult at all     Social History   Tobacco Use  Smoking Status Former   Years: 40.00   Types: Cigarettes   Quit date: 01/03/1991   Years since quitting: 30.8  Smokeless  Tobacco Never   BP Readings from Last 3 Encounters:  09/29/21 116/68  08/04/21 130/76  06/27/21 120/72   Peterson Readings from Last 3 Encounters:  09/29/21 73  08/04/21 77  06/27/21 77   Wt Readings from Last 3 Encounters:  09/29/21 140 lb (63.5 kg)  06/27/21 140 lb (63.5 kg)  06/16/21 140 lb (63.5 kg)   BMI Readings from Last 3 Encounters:  09/29/21 28.28 kg/m  06/27/21 31.39 kg/m  06/16/21 31.39 kg/m    Assessment/Interventions: Review of patient past medical history, allergies, medications, health status, including review of consultants reports, laboratory and other test data, was performed as part of comprehensive evaluation and provision of chronic care management services.   SDOH:  (Social Determinants of Health) assessments and interventions  performed: No - done June 2023 SDOH Interventions    Flowsheet Row Clinical Support from 06/16/2021 in [place] at [place] Management from 07/28/2020 in [place] at [place] from 11/18/2018 in [place] at [place]  SDOH Interventions     Depression Interventions/Treatment  Currently on Treatment -- PHQ2-9 Score <4 Follow-up Not Indicated  Financial Strain Interventions -- Intervention Not Indicated --      SDOH Screenings   Food Insecurity: No Food Insecurity (06/16/2021)  Transportation Needs: No Transportation Needs (06/16/2021)  Depression (PHQ2-9): Medium Risk (06/16/2021)  Financial Resource Strain: Low Risk  (06/16/2021)  Physical Activity: Inactive (06/16/2021)  Stress: No Stress Concern Present (06/16/2021)  Tobacco Use: Medium Risk (09/29/2021)    [place]  Allergies  Allergen Reactions   Buspar [Buspirone] Other (See Comments)    Dizzy and nauseated.   Cymbalta [Duloxetine Hcl] Other (See Comments)    fatigue   Lexapro [Escitalopram Oxalate] Other (See Comments)    Per patient, intolerant   Meperidine Hcl Other (See Comments)    unknown    Morphine And Related Other (See Comments)    Vomiting    Statins Other (See Comments)    Myalgias on statins    Medications Reviewed Today     Reviewed by Pieter Partridge, DO (Physician) on 09/29/21 at 1149  Med List Status: <None>   Medication Order Taking? Sig Documenting Provider Last Dose Status Informant  albuterol (ACCUNEB) 0.63 MG/3ML nebulizer solution [id number] Yes Take 3 mLs (0.63 mg total) by nebulization every 6 (six) hours as needed for wheezing. Rigoberto Noel, MD Taking Active   albuterol (VENTOLIN HFA) 108 [phone] Base) MCG/ACT inhaler [id number] Yes INHALE 1-2 PUFFS INTO THE LUNGS EVERY 6 HOURS AS NEEDED FOR WHEEZING OR SHORTNESS OF BREATH Tonia Ghent, MD Taking Active   [facility] ELLIPTA 62.5-25 MCG/ACT AEPB [id number] Yes Inhale 1 puff by mouth once daily Tonia Ghent, MD Taking Active   furosemide (LASIX) 20 MG tablet [id number] Yes TAKE 1 TABLET BY MOUTH ONCE DAILY AS NEEDED IN THE MORNING FOR FLUID Tonia Ghent, MD Taking Active   gabapentin (NEURONTIN) 800 MG tablet [id number] Yes Take 1 tablet (800 mg total) by mouth 3 (three) times daily. Reed, Tiffany L, DO Taking Active   HYDROcodone-acetaminophen (NORCO/VICODIN) 5-325 MG tablet [id number] Yes Take 1 tablet by mouth every 6 (six) hours as needed for moderate pain. Tonia Ghent, MD Taking Active   Ibuprofen-diphenhydrAMINE HCl (ADVIL PM) 200-25 MG CAPS [id number] Yes Take 1 tablet by mouth at bedtime. [provider] Taking Active   memantine (NAMENDA) 10 MG tablet [id number] Yes Take 1 tablet by mouth twice daily Pieter Partridge, DO Taking Active   metoprolol succinate (TOPROL-XL) 25 MG 24 hr tablet [id number] Yes Take 1 tablet by mouth once daily Tonia Ghent, MD Taking Active   nitroGLYCERIN (NITROSTAT) 0.4 MG SL tablet [id number] Yes Place 0.4 mg under the tongue every 5 (five) minutes as needed for chest pain (max 3 doses in 15 minutes.  if still having chest pain then go to the ER). Reed, Tiffany L, DO  Taking Active   ondansetron (ZOFRAN) 4 MG tablet [id number] Yes Take 4 mg by mouth every 8 (eight) hours as needed for nausea or vomiting. [provider] Taking Active   OXYGEN [id number] Yes Inhale 2 L into the lungs at bedtime. [provider] Taking Active   triamcinolone cream (KENALOG) 0.5 % [id number] Yes  APPLY  CREAM TOPICALLY TO AFFECTED AREA TWICE DAILY FOR  RASH  ON  LEGS Tonia Ghent, MD Taking Active   vitamin B-12 (CYANOCOBALAMIN) 1000 MCG tablet [id number] Yes Take 1 tablet (1,000 mcg total) by mouth daily. Tonia Ghent, MD Taking Active             Patient Active Problem List   Diagnosis Date Noted   B12 deficiency 06/29/2021   Dyspnea 03/23/2021   Fecal incontinence 01/09/2021   Medication management 07/29/2019   Joint pain 04/23/2019   Memory loss 10/06/2018   Neck pain 04/02/2018   Rash 11/18/2017   Bursitis 08/03/2017   Chronic back pain 12/19/2016   Health care maintenance 10/18/2016   Hypertension 10/18/2016   TIA (transient ischemic attack) 10/18/2016   Radicular leg pain 07/30/2016   Left knee pain 07/10/2016   Tachycardia 05/01/2016   Chronic respiratory failure with hypoxia ([place]) 04/21/2016   COPD with acute exacerbation ([place]) 04/21/2016   Bronchiectasis ([place])    Encounter for chronic pain management 01/18/2016   Vitamin D deficiency 02/25/2015   Osteopenia 01/02/2014   Medicare annual wellness visit, subsequent 12/12/2013   Advanced care planning/counseling discussion 12/12/2013   Right carotid bruit 12/12/2013   Generalized anxiety disorder    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   Chest pain 05/16/2007   HLD (hyperlipidemia) 09/26/2006    Immunization History  Administered Date(s) Administered   Fluad Quad(high Dose 65+) 11/19/2018, 10/02/2019, 10/28/2020   Influenza, High Dose Seasonal PF 10/13/2014   Influenza,inj,Quad PF,6+ Mos 09/17/2013, 10/17/2016   Influenza-Unspecified  12/09/2015, 11/02/2017   PFIZER(Purple Top)SARS-COV-2 Vaccination 02/16/2019, 03/11/2019, 01/09/2020   Pneumococcal Conjugate-13 03/11/2013   Pneumococcal Polysaccharide-23 06/05/2011   Tdap 06/07/2011   Zoster Recombinat (Shingrix) 11/26/2018   Zoster, Live 09/06/2011    Conditions to be addressed/monitored:  Hypertension, Hyperlipidemia, and COPD, Memory impairment, Query depression  Care Plan : [place]  Updates made by Charlton Haws, [place] since 10/25/2021 12:00 AM     Problem: Hypertension, Hyperlipidemia, and COPD, Memory impairment, Query depression   Priority: High     Long-Range Goal: Disease Management   Start Date: 07/28/2020  Priority: High  Note:   Current Barriers:  Struggles to self administer medications as prescribed  Pharmacist Clinical Goal(s):  Patient will contact provider office for questions/concerns as evidenced notation of same in electronic health record through collaboration with PharmD and provider.   Interventions: 1:1 collaboration with Tonia Ghent, MD regarding development and update of comprehensive plan of care as evidenced by provider attestation and co-signature Inter-disciplinary care team collaboration (see longitudinal plan of care) Comprehensive medication review performed; medication list updated in electronic medical record  Hypertension (BP goal <140/90) -Controlled -  patient not taking as prescribed - unable to half tablets -taking furosemide every day for swelling -Home BP readings: -Current treatment: Metoprolol succinate 25 mg daily - Appropriate, Effective, Safe, Accessible Furosemide 20 mg PRN -Appropriate, Effective, Safe, Accessible -Medications previously tried: none reported  -Educated on Importance of home blood pressure monitoring; -Counseled to monitor BP periodically -Recommend to continue current medication  Hyperlipidemia: (LDL goal < 70) -Uncontrolled - LDL 175 (02/2021), pt has not  tolerated statins -Current treatment: Nitroglycerin 0.4 mg PRN - not used yet -Medications previously tried: rosuvastatin, atorvastatin (myalgia) -Recommended to continue current medication  COPD (Goal: control symptoms and prevent exacerbations) -Controlled - pt is not consistent with inhaler use, she denies SOB/wheezing currently -Followed by pulmonology  -Current  treatment  Anoro Ellipta 1 puff daily - Appropriate, Effective, Safe, Accessible Albuterol HFA PRN -Appropriate, Effective, Safe, Accessible Oxygen (night) -Medications previously tried: none reported  -Reviewed benefits of consistent use of Anoro to prevent exacerbations -Recommended to continue current medication  Memory Impairment (Goal: Prevent memory decline) -Query controlled - of note patient is taking Advil PM nightly to help with sleep; she recently failed mirtazapine due to excess drowsiness and reports she was advised per PCP to continue Advil PM -Current treatment: Memantine 10 mg BID - Appropriate, Effective, Safe, Accessible -Medications previously tried/failed: donepezil -Reviewed benefits of memantine - slowing decline, not improving memory -Reviewed risks of Advil PM - benadryl component can contribute to memory loss -Advised to limit use of Advil PM as much as possible  Anxiety / Irritability (Goal: improve symptoms) -Pt Family reports irritability/aggravation, stubbornness; her moods are taking a toll on her caregivers and they ask for medication to help "mellow her out" -PHQ9: 7 (06/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 -GAD7: 0 (12/2019) - minimal anxiety -Connected with PCP for mental health support; also palliative care -Current treatment: None -Medications previously tried/failed: duloxetine (fatigue), mirtazapine (drowsiness), lexapro (intolerance), buspar (dizziness) -Educated that medication options are limited - antidepressants are first line for behavioral/neurocognitive symptoms associated with dementia,  but they are typically only helpful for depression; other options like antipsychotics, benzodiazepines should be avoided due to risks > benefit; discussed hydroxyzine trial for PRN agitation, but anticholinergics are also recommended to avoid due to possibility for worsening dementia; pt family insists they need to try something to help with moods -Recommend trial of sertraline 25 mg daily; discussed it will take 4-6 weeks to see results  Health Maintenance -Vaccine gaps: Shingrix, TDAP  Patient Goals/Self-Care Activities Patient will:  - take medications as prescribed as evidenced by patient report and record review -focus on medication adherence by routine     Medication Assistance: None required.  Patient affirms current coverage meets needs.  Compliance/Adherence/Medication fill history: Care Gaps: None  Star-Rating Drugs: None  Medication Access: Within the past 30 days, how often has patient missed a dose of medication? often Is a pillbox or other method used to improve adherence? No  Factors that may affect medication adherence? nonadherence to medications and memory impairment Are meds synced by current pharmacy? No  Are meds delivered by current pharmacy? No  Does patient experience delays in picking up medications due to transportation concerns? No   [facility] Services Reviewed: Is patient disadvantaged to use [facility]?: No  Current Rx insurance plan: [facility] Name and location of Current pharmacy:  [place] ([place]), [place] [address] [place] [address] Phone: [phone] Fax: [phone]  [place], [place] [address] Phone: [phone] Fax: [phone]  [facility] services reviewed with patient today?: No  Patient requests to transfer care to [facility] Pharm[facility]?: No  Reason patient declined to change pharmacies: Prefers  to go out and pick up medications themselves   Care Plan and Follow Up Patient Decision:  Patient agrees to Care Plan and Follow-up.  Plan: Telephone follow up appointment with care management team member scheduled for:  3 months  [name], PharmD, BCACP Clinical Pharmacist [facility] at [facility] [phone]

## 2021-10-26 ENCOUNTER — Telehealth: Payer: Self-pay | Admitting: Pharmacist

## 2021-10-26 MED ORDER — SERTRALINE HCL 25 MG PO TABS: 25.0000 mg | ORAL_TABLET | Freq: Every day | ORAL | 3 refills | Status: DC

## 2021-10-26 NOTE — Telephone Encounter (Signed)
Patient's daughter Megan Peterson reports patient's irritable mood is getting worse, unclear if depression is contributing but it is getting very difficult for family to deal with; pt has tried multiple antidepressants including mirtazapine most recently; family requests medication to help with mood for family's benefit as well as patient's.   We discussed choosing a medication should be for the patient's benefit; options are limited as antipsychotics, benzodiazepines and anticholinergics (hydroxyzine) should be avoided, risks outweigh benefits. An SSRI may be helpful but will take 4-6 weeks for effect, daughter Megan Peterson is adamant that they need to try something.  Previous medication trials: duloxetine (fatigue), mirtazapine (drowsiness), lexapro (intolerance), buspar (dizziness)  Recommend trial of sertraline 25 mg daily. Routing to PCP for input.

## 2021-10-26 NOTE — Telephone Encounter (Signed)
Agree with trying sertraline 25 mg.  Prescription sent.  Please update patient/family.  Thanks.  Let me know if not tolerated.  It will take several weeks for onset of action.  This the best option I can think of right now.

## 2021-10-27 NOTE — Telephone Encounter (Signed)
Spoke with patients daughter Megan Peterson about new medication. Advised her to let us know if she can not tolerate the medication and advised it can take a couple weeks to really get in her system to help.

## 2021-10-27 NOTE — Patient Instructions (Signed)
Visit Information  Phone number for Pharmacist: [phone]   Goals Addressed   None     Patient Care Plan: CCM Pharmacy Care Plan     Problem Identified: Hypertension, Hyperlipidemia, and COPD, Memory impairment, Query depression   Priority: High     Long-Range Goal: Disease Management   Start Date: 07/28/2020  Priority: High  Note:   Current Barriers:  Struggles to self administer medications as prescribed  Pharmacist Clinical Goal(s):  Patient will contact provider office for questions/concerns as evidenced notation of same in electronic health record through collaboration with PharmD and provider.   Interventions: 1:1 collaboration with Tonia Ghent, MD regarding development and update of comprehensive plan of care as evidenced by provider attestation and co-signature Inter-disciplinary care team collaboration (see longitudinal plan of care) Comprehensive medication review performed; medication list updated in electronic medical record  Hypertension (BP goal <140/90) -Controlled -  patient not taking as prescribed - unable to half tablets -taking furosemide every day for swelling -Home BP readings: -Current treatment: Metoprolol succinate 25 mg daily - Appropriate, Effective, Safe, Accessible Furosemide 20 mg PRN -Appropriate, Effective, Safe, Accessible -Medications previously tried: none reported  -Educated on Importance of home blood pressure monitoring; -Counseled to monitor BP periodically -Recommend to continue current medication  Hyperlipidemia: (LDL goal < 70) -Uncontrolled - LDL 175 (02/2021), pt has not tolerated statins -Current treatment: Nitroglycerin 0.4 mg PRN - not used yet -Medications previously tried: rosuvastatin, atorvastatin (myalgia) -Recommended to continue current medication  COPD (Goal: control symptoms and prevent exacerbations) -Controlled - pt is not consistent with inhaler use, she denies SOB/wheezing currently -Followed by  pulmonology  -Current treatment  Anoro Ellipta 1 puff daily - Appropriate, Effective, Safe, Accessible Albuterol HFA PRN -Appropriate, Effective, Safe, Accessible Oxygen (night) -Medications previously tried: none reported  -Reviewed benefits of consistent use of Anoro to prevent exacerbations -Recommended to continue current medication  Memory Impairment (Goal: Prevent memory decline) -Query controlled - of note patient is taking Advil PM nightly to help with sleep; she recently failed mirtazapine due to excess drowsiness and reports she was advised per PCP to continue Advil PM -Current treatment: Memantine 10 mg BID - Appropriate, Effective, Safe, Accessible -Medications previously tried/failed: donepezil -Reviewed benefits of memantine - slowing decline, not improving memory -Reviewed risks of Advil PM - benadryl component can contribute to memory loss -Advised to limit use of Advil PM as much as possible  Anxiety / Irritability (Goal: improve symptoms) -Pt Family reports irritability/aggravation, stubbornness; her moods are taking a toll on her caregivers and they ask for medication to help "mellow her out" -PHQ9: 7 (06/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 -GAD7: 0 (12/2019) - minimal anxiety -Connected with PCP for mental health support; also palliative care -Current treatment: None -Medications previously tried/failed: duloxetine (fatigue), mirtazapine (drowsiness), lexapro (intolerance), buspar (dizziness) -Educated that medication options are limited - antidepressants are first line for behavioral/neurocognitive symptoms associated with dementia, but they are typically only helpful for depression; other options like antipsychotics, benzodiazepines should be avoided due to risks > benefit; discussed hydroxyzine trial for PRN agitation, but anticholinergics are also recommended to avoid due to possibility for worsening dementia; pt family insists they need to try something to help with  moods -Recommend trial of sertraline 25 mg daily; discussed it will take 4-6 weeks to see results  Health Maintenance -Vaccine gaps: Shingrix, TDAP  Patient Goals/Self-Care Activities Patient will:  - take medications as prescribed as evidenced by patient report and record review -focus on medication adherence by routine  Patient verbalizes understanding of instructions and care plan provided today and agrees to view in [place]. Active MyChart status and patient understanding of how to access instructions and care plan via MyChart confirmed with patient.    Telephone follow up appointment with pharmacy team member scheduled for: 3 months  [name], PharmD, [facility] Clinical Pharmacist [facility] at [facility] [phone]

## 2021-10-28 DIAGNOSIS — J9611 Chronic respiratory failure with hypoxia: Secondary | ICD-10-CM | POA: Diagnosis not present

## 2021-11-01 ENCOUNTER — Other Ambulatory Visit: Payer: Self-pay | Admitting: Family Medicine

## 2021-11-01 MED ORDER — HYDROCODONE-ACETAMINOPHEN 5-325 MG PO TABS: 1.0000 | ORAL_TABLET | Freq: Four times a day (QID) | ORAL | 0 refills | Status: DC | PRN

## 2021-11-01 NOTE — Telephone Encounter (Signed)
Refill request for HYDROcodone-acetaminophen (NORCO/VICODIN) 5-325 MG tablet  LOV - 06/27/21 Next OV - not scheduled Last refill - 09/23/21 #120/0

## 2021-11-08 ENCOUNTER — Other Ambulatory Visit: Payer: Medicare Other | Admitting: Internal Medicine

## 2021-11-08 VITALS — BP 130/70 | HR 87 | Temp 97.5°F | Resp 16

## 2021-11-08 DIAGNOSIS — J9611 Chronic respiratory failure with hypoxia: Secondary | ICD-10-CM

## 2021-11-08 DIAGNOSIS — F0280 Dementia in other diseases classified elsewhere without behavioral disturbance: Secondary | ICD-10-CM

## 2021-11-08 DIAGNOSIS — B372 Candidiasis of skin and nail: Secondary | ICD-10-CM | POA: Diagnosis not present

## 2021-11-08 DIAGNOSIS — Z515 Encounter for palliative care: Secondary | ICD-10-CM

## 2021-11-08 DIAGNOSIS — J432 Centrilobular emphysema: Secondary | ICD-10-CM | POA: Diagnosis not present

## 2021-11-08 DIAGNOSIS — F321 Major depressive disorder, single episode, moderate: Secondary | ICD-10-CM

## 2021-11-08 DIAGNOSIS — G309 Alzheimer's disease, unspecified: Secondary | ICD-10-CM | POA: Diagnosis not present

## 2021-11-08 NOTE — Progress Notes (Addendum)
Megan Peterson: [phone]  Fax: [phone]   Date of encounter:11/08/21 9:01 AM PATIENT NAME: Megan Peterson [address] [place] [address]   [phone] (home)  DOB: Nov 15, 1940 MRN: [id number] PRIMARY CARE PROVIDER:    Tonia Ghent, MD,  [place] [address] [phone]  REFERRING PROVIDER:   Tonia Ghent, MD [address] [place],  [place] [address] [phone]  RESPONSIBLE PARTY:    Contact Information     Name Relation Home Work Mobile   [place] Daughter   [phone]        I met face to face with patient and family in her daughter, [name]'s home, where she stays now. Palliative Care was asked to follow this patient by consultation request of  Tonia Ghent, MD to address advance care planning and complex medical decision making. This is follow-up visit.                                     ASSESSMENT AND PLAN / RECOMMENDATIONS:   Advance Care Planning/Goals of Care: Goals include to maximize quality of life and symptom management. Patient/health care surrogate gave his/her permission to discuss.Our advance care planning conversation included a discussion about:    The value and importance of advance care planning  Experiences with loved ones who have been seriously ill or have died  Exploration of personal, cultural or spiritual beliefs that might influence medical decisions  Exploration of goals of care in the event of a sudden injury or illness  Identification  of a healthcare agent--daughter, [name] Review and updating or creation of an  advance directive document . Decision not to resuscitate or to de-escalate disease focused treatments due to poor prognosis. CODE STATUS:  DNR, MOST on file previously completed in August--no changes requested  Symptom Management/Plan: 1. Candidal skin infection -due to poor hygiene--pt refusing to  take full showers -daughter encourages bird baths, but patient not getting them daily either -hiring in home care is not a financial option for them--resources previously provided -goal to stay in home - how has yeast under breasts so prescribed nystatin (MYCOSTATIN/NYSTOP) powder; Apply 1 Application topically 3 (three) times daily.  Dispense: 30 g; Refill: 3  2. Major neurocognitive disorder due to Alzheimer's disease ([place]) -ongoing short-term memory loss and some challenges with bathing, wearing O2, doesn't want to take meds, her daughter is stressed and overwhelmed, but pt also not willing to attend day program  -continue 24x7 with daughter, [name]  3. Depression, major, single episode, moderate ([place]) -continue zoloft which was recently added, no significant problems from this though no obvious benefit yet (too soon to tell)  4. Chronic respiratory failure with hypoxia (HCC) -continue oxygen therapy though she refuses to wear it in front of others, often removes it and insisted cord be shortened so she can't wear it when in the bathroom  5. Centrilobular emphysema ([place]) -continue current regimen and monitor  6. Palliative care encounter -due to change in palliative care criteria, pt no longer can be followed by RN/SW team in community -[place] notified that she can reach out or Dr. Damita Dunnings or Dr. Elsworth Soho can in the even [place] has a progressive decline with evidence for hospice eligibility--letter to be sent with contacts   I spent 60 minutes providing this consultation. More than 50% of the time in  this consultation was spent in counseling and care coordination.   PPS: weak 50% as remains ambulatory but not doing ADLs on her own  HOSPICE ELIGIBILITY/DIAGNOSIS:not at this time/potentially emphysema or dementia   Chief Complaint: Follow-up palliative visit  HISTORY OF PRESENT ILLNESS:  Megan Peterson is a 81 y.o. year old female  with dementia, emphysema, chronic respiratory failure,  depression, obesity,  b12 deficiency, chronic back pain, incontinence at times, anxiety, hyperlipdiemia, prior TIA among others seen in palliative f/u at home with daughter, [name].  See a/p for details.  History obtained from review of EMR, discussion with primary team, and interview with family, facility staff/caregiver and/or Ms. Marlou Sa.  I reviewed available labs, medications, imaging, studies and related documents from the EMR.  Records reviewed and summarized above.   ROS Review of Systemssee a/p for details  Physical Exam: Vitals:   11/08/21 0852  BP: 130/70  Pulse: 87  Resp: 16  Temp: (!) 97.5 F (36.4 C)  SpO2: 91%   There is no height or weight on file to calculate BMI. Wt Readings from Last 500 Encounters:  09/29/21 140 lb (63.5 kg)  06/27/21 140 lb (63.5 kg)  06/16/21 140 lb (63.5 kg)  05/11/21 146 lb 3.2 oz (66.3 kg)  03/21/21 142 lb (64.4 kg)  02/18/21 139 lb 12.8 oz (63.4 kg)  02/04/21 142 lb (64.4 kg)  01/07/21 138 lb (62.6 kg)  12/30/20 137 lb (62.1 kg)  10/28/20 133 lb (60.3 kg)  09/07/20 145 lb 15.1 oz (66.2 kg)  08/11/20 133 lb 3.2 oz (60.4 kg)  06/24/20 132 lb (59.9 kg)  03/29/20 134 lb (60.8 kg)  03/17/20 132 lb 6.4 oz (60.1 kg)  12/16/19 135 lb 6.4 oz (61.4 kg)  11/04/19 132 lb 9.6 oz (60.1 kg)  10/02/19 134 lb 7 oz (61 kg)  09/18/19 133 lb (60.3 kg)  07/29/19 131 lb 9.6 oz (59.7 kg)  04/22/19 130 lb 4 oz (59.1 kg)  02/27/19 127 lb 2 oz (57.7 kg)  02/04/19 130 lb (59 kg)  01/09/19 130 lb 1 oz (59 kg)  11/19/18 128 lb 9 oz (58.3 kg)  10/03/18 124 lb 8 oz (56.5 kg)  05/17/18 123 lb 7 oz (56 kg)  04/02/18 123 lb 9 oz (56 kg)  02/26/18 122 lb 8 oz (55.6 kg)  01/04/18 123 lb 8 oz (56 kg)  11/16/17 124 lb 8 oz (56.5 kg)  11/06/17 124 lb 12 oz (56.6 kg)  08/02/17 122 lb 12 oz (55.7 kg)  06/21/17 125 lb 4 oz (56.8 kg)  04/13/17 123 lb (55.8 kg)  03/20/17 130 lb 8 oz (59.2 kg)  12/18/16 128 lb 4 oz (58.2 kg)  11/13/16 125 lb (56.7 kg)  10/24/16  126 lb 4 oz (57.3 kg)  10/18/16 126 lb (57.2 kg)  10/17/16 126 lb (57.2 kg)  08/22/16 125 lb 14.1 oz (57.1 kg)  08/17/16 127 lb 3.3 oz (57.7 kg)  08/15/16 127 lb 3.3 oz (57.7 kg)  08/10/16 126 lb 15.8 oz (57.6 kg)  07/28/16 128 lb (58.1 kg)  07/25/16 127 lb (57.6 kg)  07/25/16 127 lb 6.8 oz (57.8 kg)  07/20/16 125 lb 14.1 oz (57.1 kg)  07/18/16 127 lb 3.3 oz (57.7 kg)  07/14/16 125 lb 12.8 oz (57.1 kg)  07/10/16 126 lb (57.2 kg)  07/06/16 125 lb 3.5 oz (56.8 kg)  07/04/16 127 lb 6.8 oz (57.8 kg)  06/29/16 127 lb 10.3 oz (57.9 kg)  06/27/16 126 lb 12.2 oz (57.5  kg)  06/22/16 127 lb 13.9 oz (58 kg)  06/20/16 127 lb 13.9 oz (58 kg)  06/15/16 127 lb 3.3 oz (57.7 kg)  06/13/16 126 lb 12.2 oz (57.5 kg)  06/08/16 127 lb 6.8 oz (57.8 kg)  06/06/16 127 lb 10.3 oz (57.9 kg)  06/01/16 128 lb 1.4 oz (58.1 kg)  05/30/16 127 lb 3.3 oz (57.7 kg)  05/25/16 125 lb 14.1 oz (57.1 kg)  05/23/16 126 lb 12.2 oz (57.5 kg)  05/18/16 124 lb 12.5 oz (56.6 kg)  05/16/16 125 lb 7.1 oz (56.9 kg)  05/08/16 126 lb 8.7 oz (57.4 kg)  05/01/16 125 lb 8 oz (56.9 kg)  04/25/16 123 lb (55.8 kg)  04/21/16 129 lb 14.4 oz (58.9 kg)  04/11/16 123 lb 8 oz (56 kg)  04/11/16 123 lb 8 oz (56 kg)  03/27/16 122 lb 8 oz (55.6 kg)  03/23/16 119 lb 1.6 oz (54 kg)  09/27/15 120 lb 8 oz (54.7 kg)  02/25/15 121 lb 8 oz (55.1 kg)  04/20/14 119 lb (54 kg)  03/13/14 117 lb 4 oz (53.2 kg)  02/02/14 119 lb 8 oz (54.2 kg)  12/12/13 121 lb 8 oz (55.1 kg)  10/15/13 126 lb 8 oz (57.4 kg)  06/10/13 127 lb 8 oz (57.8 kg)  03/11/13 126 lb 4 oz (57.3 kg)  03/19/12 126 lb 6.4 oz (57.3 kg)  09/06/11 122 lb (55.3 kg)  06/05/11 124 lb (56.2 kg)  06/27/10 130 lb 8 oz (59.2 kg)  06/18/06 123 lb 2.1 oz (55.9 kg)   Physical Exam Constitutional:      Appearance: Normal appearance. She is obese.  HENT:     Head: Normocephalic and atraumatic.  Cardiovascular:     Rate and Rhythm: Normal rate and regular rhythm.  Pulmonary:      Effort: Pulmonary effort is normal.     Breath sounds: No wheezing.  Abdominal:     General: Bowel sounds are normal.     Tenderness: There is no abdominal tenderness.  Musculoskeletal:        General: Normal range of motion.     Comments: Nonpitting edema with chronic venous stasis change in lower legs  Skin:    General: Skin is warm and dry.     Comments: Except beneath breasts is moist with erythema and skin breakdown bilaterally, worse on right  Neurological:     General: No focal deficit present.     Mental Status: She is alert.     Comments: Oriented to person, place, conversive when asked questions     CURRENT PROBLEM LIST:  Patient Active Problem List   Diagnosis Date Noted   B12 deficiency 06/29/2021   Dyspnea 03/23/2021   Fecal incontinence 01/09/2021   Medication management 07/29/2019   Joint pain 04/23/2019   Memory loss 10/06/2018   Neck pain 04/02/2018   Rash 11/18/2017   Bursitis 08/03/2017   Chronic back pain 12/19/2016   Health care maintenance 10/18/2016   Hypertension 10/18/2016   TIA (transient ischemic attack) 10/18/2016   Radicular leg pain 07/30/2016   Left knee pain 07/10/2016   Tachycardia 05/01/2016   Chronic respiratory failure with hypoxia ([place]) 04/21/2016   COPD with acute exacerbation ([place]) 04/21/2016   Bronchiectasis ([place])    Encounter for chronic pain management 01/18/2016   Vitamin D deficiency 02/25/2015   Osteopenia 01/02/2014   Medicare annual wellness visit, subsequent 12/12/2013   Advanced care planning/counseling discussion 12/12/2013   Right carotid bruit 12/12/2013   Generalized  anxiety disorder    COPD (chronic obstructive pulmonary disease) with emphysema ([place]) 06/05/2011   Chest pain 05/16/2007   HLD (hyperlipidemia) 09/26/2006    PAST MEDICAL HISTORY:  Active Ambulatory Problems    Diagnosis Date Noted   HLD (hyperlipidemia) 09/26/2006   Chest pain 05/16/2007   COPD (chronic obstructive pulmonary disease) with  emphysema (H[facility]) 06/05/2011   Generalized anxiety disorder    Medicare annual wellness visit, subsequent 12/12/2013   Advanced care planning/counseling discussion 12/12/2013   Right carotid bruit 12/12/2013   Osteopenia 01/02/2014   Vitamin D deficiency 02/25/2015   Encounter for chronic pain management 01/18/2016   Bronchiectasis ([place])    Chronic respiratory failure with hypoxia ([place]) 04/21/2016   COPD with acute exacerbation ([place]) 04/21/2016   Tachycardia 05/01/2016   Left knee pain 07/10/2016   Radicular leg pain 07/30/2016   Health care maintenance 10/18/2016   Hypertension 10/18/2016   TIA (transient ischemic attack) 10/18/2016   Chronic back pain 12/19/2016   Bursitis 08/03/2017   Rash 11/18/2017   Neck pain 04/02/2018   Memory loss 10/06/2018   Joint pain 04/23/2019   Medication management 07/29/2019   Fecal incontinence 01/09/2021   Dyspnea 03/23/2021   B12 deficiency 06/29/2021   Resolved Ambulatory Problems    Diagnosis Date Noted   Chronic lower back pain 09/26/2006   TOBACCO ABUSE, HX OF 02/15/2009   HYSTERECTOMY, HX OF 09/26/2006   Routine health maintenance 06/06/2011   Ex-smoker    Abnormal drug screen 10/02/2013   Acute respiratory failure ([place]) 03/19/2016   Influenza 03/19/2016   Sepsis ([place]) 04/21/2016   HCAP (healthcare-associated pneumonia) 04/21/2016   Pain medication agreement signed 12/19/2016   Past Medical History:  Diagnosis Date   Anxiety disorder 1997   Back pain, chronic    COPD (chronic obstructive pulmonary disease) ([place])    Detached retina, left Aug '12   Dislocated shoulder april '12   Hyperlipidemia    Squamous cell skin cancer, chin Spring '12    SOCIAL HX:  Social History   Tobacco Use   Smoking status: Former    Years: 40.00    Types: Cigarettes    Quit date: 01/03/1991    Years since quitting: 30.8   Smokeless tobacco: Never  Substance Use Topics   Alcohol use: No     ALLERGIES:  Allergies  Allergen Reactions    Buspar [Buspirone] Other (See Comments)    Dizzy and nauseated.   Cymbalta [Duloxetine Hcl] Other (See Comments)    fatigue   Lexapro [Escitalopram Oxalate] Other (See Comments)    Per patient, intolerant   Meperidine Hcl Other (See Comments)    unknown   Morphine And Related Other (See Comments)    Vomiting    Statins Other (See Comments)    Myalgias on statins      PERTINENT MEDICATIONS:  Outpatient Encounter Medications as of 11/08/2021  Medication Sig   nystatin (MYCOSTATIN/NYSTOP) powder Apply 1 Application topically 3 (three) times daily.   albuterol (ACCUNEB) 0.63 MG/3ML nebulizer solution Take 3 mLs (0.63 mg total) by nebulization every 6 (six) hours as needed for wheezing.   albuterol (VENTOLIN HFA) 108 (90 Base) MCG/ACT inhaler INHALE 1-2 PUFFS INTO THE LUNGS EVERY 6 HOURS AS NEEDED FOR WHEEZING OR SHORTNESS OF BREATH   ANORO ELLIPTA 62.5-25 MCG/ACT AEPB Inhale 1 puff by mouth once daily   gabapentin (NEURONTIN) 800 MG tablet Take 1 tablet (800 mg total) by mouth 3 (three) times daily.   HYDROcodone-acetaminophen (NORCO/VICODIN) 5-325 MG  tablet Take 1 tablet by mouth every 6 (six) hours as needed for moderate pain.   Ibuprofen-diphenhydrAMINE HCl (ADVIL PM) 200-25 MG CAPS Take 1 tablet by mouth at bedtime.   memantine (NAMENDA) 10 MG tablet Take 1 tablet by mouth twice daily   metoprolol succinate (TOPROL-XL) 25 MG 24 hr tablet Take 1 tablet by mouth once daily   nitroGLYCERIN (NITROSTAT) 0.4 MG SL tablet Place 0.4 mg under the tongue every 5 (five) minutes as needed for chest pain (max 3 doses in 15 minutes.  if still having chest pain then go to the ER).   ondansetron (ZOFRAN) 4 MG tablet Take 4 mg by mouth every 8 (eight) hours as needed for nausea or vomiting.   OXYGEN Inhale 2 L into the lungs at bedtime.   sertraline (ZOLOFT) 25 MG tablet Take 1 tablet (25 mg total) by mouth daily.   triamcinolone cream (KENALOG) 0.5 % APPLY  CREAM TOPICALLY TO AFFECTED AREA TWICE DAILY  FOR  RASH  ON  LEGS   vitamin B-12 (CYANOCOBALAMIN) 1000 MCG tablet Take 1 tablet (1,000 mcg total) by mouth daily.   [DISCONTINUED] furosemide (LASIX) 20 MG tablet TAKE 1 TABLET BY MOUTH ONCE DAILY AS NEEDED IN THE MORNING FOR FLUID   No facility-administered encounter medications on file as of 11/08/2021.    Thank you for the opportunity to participate in the care of Ms. Charo.  The palliative care team will continue to follow. Please call our office at [phone] if we can be of additional assistance.   Hollace Kinnier, DO  COVID-19 PATIENT SCREENING TOOL Asked and negative response unless otherwise noted:  Have you had symptoms of covid, tested positive or been in contact with someone with symptoms/positive test in the past 5-10 days? no

## 2021-11-09 ENCOUNTER — Other Ambulatory Visit: Payer: Self-pay | Admitting: Family Medicine

## 2021-11-10 MED ORDER — NYSTATIN 100000 UNIT/GM EX POWD: 1.0000 | Freq: Three times a day (TID) | CUTANEOUS | 3 refills | Status: DC

## 2021-11-13 ENCOUNTER — Encounter: Payer: Self-pay | Admitting: Internal Medicine

## 2021-11-17 ENCOUNTER — Other Ambulatory Visit: Payer: Self-pay | Admitting: Family Medicine

## 2021-11-21 DIAGNOSIS — J9611 Chronic respiratory failure with hypoxia: Secondary | ICD-10-CM | POA: Diagnosis not present

## 2021-11-21 DIAGNOSIS — J432 Centrilobular emphysema: Secondary | ICD-10-CM | POA: Diagnosis not present

## 2021-11-28 DIAGNOSIS — J9611 Chronic respiratory failure with hypoxia: Secondary | ICD-10-CM | POA: Diagnosis not present

## 2021-12-07 ENCOUNTER — Encounter (HOSPITAL_BASED_OUTPATIENT_CLINIC_OR_DEPARTMENT_OTHER): Payer: Self-pay | Admitting: Pulmonary Disease

## 2021-12-07 ENCOUNTER — Ambulatory Visit (INDEPENDENT_AMBULATORY_CARE_PROVIDER_SITE_OTHER): Payer: Medicare Other | Admitting: Pulmonary Disease

## 2021-12-07 VITALS — BP 126/72 | HR 72 | Temp 98.0°F | Ht <= 58 in | Wt 147.0 lb

## 2021-12-07 DIAGNOSIS — J432 Centrilobular emphysema: Secondary | ICD-10-CM | POA: Diagnosis not present

## 2021-12-07 DIAGNOSIS — Z23 Encounter for immunization: Secondary | ICD-10-CM | POA: Diagnosis not present

## 2021-12-07 DIAGNOSIS — J9611 Chronic respiratory failure with hypoxia: Secondary | ICD-10-CM | POA: Diagnosis not present

## 2021-12-07 NOTE — Patient Instructions (Signed)
x flu shot today  COVID booster recommended.  Try to use oxygen when walking and during sleep  Call if you develop yellow sputum or increase shortness of breath

## 2021-12-07 NOTE — Progress Notes (Signed)
   Subjective:    Patient ID: Megan Peterson, female    DOB: 09/18/1940, 81 y.o.   MRN: [id number]  HPI  81 yo ex-smoker FU  for COPD, bronchiectasis and chronic respiratory failure on oxygen   PMH - chronic pain, alzheimer's  Chief Complaint  Patient presents with  . Follow-up    Pt states that everything is still about the same since LOV.    Significant tests/ events reviewed   08/11/2019-overnight oximetry on room air-duration of sleep 6 hours and 57 minutes, time spent below 88% 5 hours and 25 minutes, average SPO2 87, SpO2 low 61 >>start 2L    ONO on 2L 08/27/2019-duration of sleep 7 hours and 57 minutes, time spent below 88% 6 hours and 19 minutes, baseline oxygen level 87%, SpO2 low 69% >> increase to 4L during sleep   PFT  06/2016 that showed similar lung function (compared 2013) with moderate to severe airflow obstruction with an FEV1 at 55%, ratio 52, positive bronchodilator response, FVC 80%, DLCO 56%.   CT angiogram 04/21/16 was negative for pulmonary embolism but showed new bilateral upper lobe groundglass infiltrates with mild bronchiectasis in both lower lobes  Review of Systems neg for any significant sore throat, dysphagia, itching, sneezing, nasal congestion or excess/ purulent secretions, fever, chills, sweats, unintended wt loss, pleuritic or exertional cp, hempoptysis, orthopnea pnd or change in chronic leg swelling. Also denies presyncope, palpitations, heartburn, abdominal pain, nausea, vomiting, diarrhea or change in bowel or urinary habits, dysuria,hematuria, rash, arthralgias, visual complaints, headache, numbness weakness or ataxia.     Objective:   Physical Exam  Gen. Pleasant, well-nourished, in no distress ENT - no thrush, no pallor/icterus,no post nasal drip Neck: No JVD, no thyromegaly, no carotid bruits Lungs: no use of accessory muscles, no dullness to percussion, bibasal rales no rhonchi  Cardiovascular: Rhythm regular, heart sounds  normal, no murmurs  or gallops, no peripheral edema Musculoskeletal: No deformities, no cyanosis or clubbing        Assessment & Plan:

## 2021-12-08 ENCOUNTER — Other Ambulatory Visit: Payer: Self-pay | Admitting: Family Medicine

## 2021-12-08 DIAGNOSIS — J9611 Chronic respiratory failure with hypoxia: Secondary | ICD-10-CM | POA: Diagnosis not present

## 2021-12-08 MED ORDER — METOPROLOL SUCCINATE ER 25 MG PO TB24: 25.0000 mg | ORAL_TABLET | Freq: Every day | ORAL | 0 refills | Status: DC

## 2021-12-08 MED ORDER — FUROSEMIDE 20 MG PO TABS: ORAL_TABLET | ORAL | 0 refills | Status: DC

## 2021-12-08 NOTE — Assessment & Plan Note (Signed)
Continue Anoro. Albuterol for rescue We discussed COPD action plan and signs and symptoms of COPD exacerbation.  We administered flu shot today Advised RSV and COVID booster but not sure that daughter will be able to convince her to take this

## 2021-12-08 NOTE — Telephone Encounter (Signed)
Refill request for  HYDROcodone-acetaminophen (NORCO/VICODIN) 5-325 MG tablet    LOV - 06/27/21 Next OV - 12/12/21 Last refill - 11/01/21 #120/0

## 2021-12-08 NOTE — Addendum Note (Signed)
Addended by: Sherrilee Gilles B on: 12/08/2021 10:43 AM   Modules accepted: Orders

## 2021-12-08 NOTE — Telephone Encounter (Signed)
  Encourage patient to contact the pharmacy for refills or they can request refills through [facility]  Did the patient contact the pharmacy: Yes  LAST APPOINTMENT DATE: 06/27/2021  NEXT APPOINTMENT DATE: 12/12/2021  MEDICATION: HYDROcodone-acetaminophen (NORCO/VICODIN) 5-325 MG tablet   metoprolol succinate (TOPROL-XL) 25 MG 24 hr tablet   furosemide (LASIX) 20 MG tablet   Is the patient out of medication? No  PHARMACY: [place] ([facility] [place] [facility] [place]   Let patient know to contact pharmacy at the end of the day to make sure medication is ready.  Please notify patient to allow 48-72 hours to process

## 2021-12-08 NOTE — Assessment & Plan Note (Signed)
She has oxygen to use on an as-needed basis, ideally she should use this during exertion and during sleep but her Alzheimer's complicates management of her lung issues.

## 2021-12-09 ENCOUNTER — Telehealth: Payer: Self-pay | Admitting: Family Medicine

## 2021-12-09 MED ORDER — HYDROCODONE-ACETAMINOPHEN 5-325 MG PO TABS: 1.0000 | ORAL_TABLET | Freq: Four times a day (QID) | ORAL | 0 refills | Status: DC | PRN

## 2021-12-09 MED ORDER — METOPROLOL SUCCINATE ER 25 MG PO TB24: 25.0000 mg | ORAL_TABLET | Freq: Every day | ORAL | 1 refills | Status: DC

## 2021-12-09 MED ORDER — FUROSEMIDE 20 MG PO TABS: ORAL_TABLET | ORAL | 2 refills | Status: DC

## 2021-12-09 NOTE — Telephone Encounter (Signed)
Patient called and asked could she get a copy of the power of attorney. Call back number [phone].

## 2021-12-09 NOTE — Addendum Note (Signed)
Addended by: Tonia Ghent on: 12/09/2021 07:51 AM   Modules accepted: Orders

## 2021-12-12 ENCOUNTER — Encounter: Payer: Self-pay | Admitting: Family Medicine

## 2021-12-12 ENCOUNTER — Ambulatory Visit (INDEPENDENT_AMBULATORY_CARE_PROVIDER_SITE_OTHER): Payer: Medicare Other | Admitting: Family Medicine

## 2021-12-12 VITALS — BP 112/60 | HR 78 | Temp 97.7°F | Wt 152.0 lb

## 2021-12-12 DIAGNOSIS — R21 Rash and other nonspecific skin eruption: Secondary | ICD-10-CM

## 2021-12-12 DIAGNOSIS — H5789 Other specified disorders of eye and adnexa: Secondary | ICD-10-CM

## 2021-12-12 DIAGNOSIS — R413 Other amnesia: Secondary | ICD-10-CM | POA: Diagnosis not present

## 2021-12-12 DIAGNOSIS — B372 Candidiasis of skin and nail: Secondary | ICD-10-CM

## 2021-12-12 MED ORDER — NYSTATIN 100000 UNIT/GM EX POWD: 1.0000 | Freq: Three times a day (TID) | CUTANEOUS | 3 refills | Status: DC

## 2021-12-12 MED ORDER — SERTRALINE HCL 50 MG PO TABS: 50.0000 mg | ORAL_TABLET | Freq: Every day | ORAL | 1 refills | Status: DC

## 2021-12-12 NOTE — Progress Notes (Unsigned)
L eye sx.  Intermittent R eyelid and conjunctival irritation.  No sx now.  No R sided sx. No contacts.  Going on for about 2 months.  Has been using visine with some relief.    Rash.   On abd wall. Under B breasts.  Exam done with daughter in the room.  Superficial fungal infection.   She can't stand for a shower and doesn't like to sit in the shower.  Discussed bird bath.    Taking sertraline '25mg'$ .  Irritable, disagreeable, sad.  Sx got worse in the last year.  Less motivated, doesn't want to leave the house.    Meds, vitals, and allergies reviewed.   ROS: Per HPI unless specifically indicated in ROS section   1+ BLE edema.  Superficial fungal infection inferior to B breasts.    She took mirtazapine but didn't do well with that.    Family noted memory is worse in the meantime.

## 2021-12-12 NOTE — Patient Instructions (Addendum)
Try a bird bath in the shower, sitting down.   Use nystatin powder.   Keep using visine.   I would try taking '50mg'$  of sertraline in the meantime and let me check on options.   Take care.  Glad to see you.

## 2021-12-12 NOTE — Telephone Encounter (Signed)
Do not see a copy of POA in chart. Will discuss with patient at [facility].

## 2021-12-14 ENCOUNTER — Telehealth: Payer: Self-pay | Admitting: Family Medicine

## 2021-12-14 DIAGNOSIS — H5789 Other specified disorders of eye and adnexa: Secondary | ICD-10-CM

## 2021-12-14 HISTORY — DX: Other specified disorders of eye and adnexa: H57.89

## 2021-12-14 NOTE — Telephone Encounter (Signed)
Please update her daughter.  There are other options to treat her mood but going up on the sertraline to the maximum tolerated dose would likely be the best/safest option at this point.  If she does not improve on sertraline 50 mg after 2-3 weeks, I would try increasing to 100 mg.  If that is not helping then please let me know.  Thanks.

## 2021-12-14 NOTE — Assessment & Plan Note (Signed)
With mood affected.  Did not tolerate mirtazapine.  Would increase sertraline to 50 mg in the meantime.  See following phone note.

## 2021-12-14 NOTE — Assessment & Plan Note (Signed)
Not noted now.  Would use Visine as needed.  Update me as needed.

## 2021-12-14 NOTE — Assessment & Plan Note (Signed)
Discussed more frequent bathing and then using nystatin powder.

## 2021-12-15 NOTE — Telephone Encounter (Signed)
Spoke with patients daughter and discussed sertraline doses. [name] is going to try what's recommended and if does not help with let us know.

## 2021-12-18 ENCOUNTER — Encounter: Payer: Self-pay | Admitting: Family Medicine

## 2021-12-21 DIAGNOSIS — J9611 Chronic respiratory failure with hypoxia: Secondary | ICD-10-CM | POA: Diagnosis not present

## 2021-12-21 DIAGNOSIS — J432 Centrilobular emphysema: Secondary | ICD-10-CM | POA: Diagnosis not present

## 2021-12-28 DIAGNOSIS — J9611 Chronic respiratory failure with hypoxia: Secondary | ICD-10-CM | POA: Diagnosis not present

## 2021-12-29 ENCOUNTER — Other Ambulatory Visit: Payer: Self-pay | Admitting: Family Medicine

## 2021-12-29 NOTE — Telephone Encounter (Signed)
Refill request for Gabapentin 800 MG Oral Tablet   LOV - 12/12/21 Next OV - not scheduled Last refill - 06/13/21 #270/3

## 2022-01-08 ENCOUNTER — Other Ambulatory Visit: Payer: Self-pay | Admitting: Family Medicine

## 2022-01-08 DIAGNOSIS — J9611 Chronic respiratory failure with hypoxia: Secondary | ICD-10-CM | POA: Diagnosis not present

## 2022-01-09 NOTE — Telephone Encounter (Signed)
Refill request for HYDROcodone-acetaminophen (NORCO/VICODIN) 5-325 MG tablet   LOV - 12/12/21 Next OV - not scheduled Last refill - 12/09/21 #120/0

## 2022-01-10 ENCOUNTER — Telehealth: Payer: Self-pay

## 2022-01-10 MED ORDER — HYDROCODONE-ACETAMINOPHEN 5-325 MG PO TABS: 1.0000 | ORAL_TABLET | Freq: Four times a day (QID) | ORAL | 0 refills | Status: DC | PRN

## 2022-01-10 NOTE — Progress Notes (Signed)
Care Management & Coordination Services Pharmacy Team  Reason for Encounter: Appointment Reminder  Contacted patient on 01/10/2022   Medications: Outpatient Encounter Medications as of 01/10/2022  Medication Sig   albuterol (ACCUNEB) 0.63 MG/3ML nebulizer solution Take 3 mLs (0.63 mg total) by nebulization every 6 (six) hours as needed for wheezing.   albuterol (VENTOLIN HFA) 108 (90 Base) MCG/ACT inhaler INHALE 1-2 PUFFS INTO THE LUNGS EVERY 6 HOURS AS NEEDED FOR WHEEZING OR SHORTNESS OF BREATH   furosemide (LASIX) 20 MG tablet TAKE 1 TABLET BY MOUTH ONCE DAILY AS NEEDED IN THE MORNING FOR FLUID   gabapentin (NEURONTIN) 800 MG tablet TAKE 1 TABLET BY MOUTH THREE TIMES DAILY   HYDROcodone-acetaminophen (NORCO/VICODIN) 5-325 MG tablet Take 1 tablet by mouth every 6 (six) hours as needed for moderate pain.   Ibuprofen-diphenhydrAMINE HCl (ADVIL PM) 200-25 MG CAPS Take 1 tablet by mouth at bedtime.   memantine (NAMENDA) 10 MG tablet Take 1 tablet by mouth twice daily   metoprolol succinate (TOPROL-XL) 25 MG 24 hr tablet Take 1 tablet (25 mg total) by mouth daily.   nitroGLYCERIN (NITROSTAT) 0.4 MG SL tablet Place 0.4 mg under the tongue every 5 (five) minutes as needed for chest pain (max 3 doses in 15 minutes.  if still having chest pain then go to the ER).   nystatin (MYCOSTATIN/NYSTOP) powder Apply 1 Application topically 3 (three) times daily.   ondansetron (ZOFRAN) 4 MG tablet Take 4 mg by mouth every 8 (eight) hours as needed for nausea or vomiting.   OXYGEN Inhale 2 L into the lungs at bedtime.   sertraline (ZOLOFT) 50 MG tablet Take 1 tablet (50 mg total) by mouth daily.   triamcinolone cream (KENALOG) 0.5 % APPLY  CREAM TOPICALLY TO AFFECTED AREA TWICE DAILY FOR  RASH  ON  LEGS   umeclidinium-vilanterol (ANORO ELLIPTA) 62.5-25 MCG/ACT AEPB Inhale 1 puff by mouth once daily   vitamin B-12 (CYANOCOBALAMIN) 1000 MCG tablet Take 1 tablet (1,000 mcg total) by mouth daily.   No  facility-administered encounter medications on file as of 01/10/2022.   No results found for: "HGBA1C", "MICROALBUR"  BP Readings from Last 3 Encounters:  12/12/21 112/60  12/07/21 126/72  11/08/21 130/70      Patient contacted to confirm telephone appointment with [name],   PharmD, on 01/13/2022 at 1:00.  Do you have any problems getting your medications? No   What is your top health concern you would like to discuss at your upcoming visit? Daughter states patient waiting for the coffee to brew this am. Patient is feeling well and no issues with her breathing .  Have you seen any other providers since your last visit with PCP? No    Star Rating Drugs:  Medication:  Last Fill: Day Supply No star medications identified  Care Gaps: Annual wellness visit in last year? Yes   [name], CPP notified  [name], [place]  [phone]

## 2022-01-11 ENCOUNTER — Other Ambulatory Visit: Payer: Self-pay | Admitting: Family Medicine

## 2022-01-11 MED ORDER — HYDROCODONE-ACETAMINOPHEN 5-325 MG PO TABS: 1.0000 | ORAL_TABLET | Freq: Four times a day (QID) | ORAL | 0 refills | Status: DC | PRN

## 2022-01-11 NOTE — Telephone Encounter (Signed)
Duplicate request; please deny.

## 2022-01-11 NOTE — Telephone Encounter (Signed)
Please void this at the pharmacy.  I mistakenly sent the rx.  Thanks.

## 2022-01-12 NOTE — Telephone Encounter (Signed)
Rx cancelled at pharmacy.

## 2022-01-13 ENCOUNTER — Ambulatory Visit: Payer: Medicare Other | Admitting: Pharmacist

## 2022-01-13 ENCOUNTER — Telehealth: Payer: Self-pay | Admitting: Family Medicine

## 2022-01-13 ENCOUNTER — Other Ambulatory Visit: Payer: Self-pay | Admitting: Family Medicine

## 2022-01-13 NOTE — Telephone Encounter (Signed)
This looks like  this was refilled 01/11/22. It appears that this is a duplicate request.

## 2022-01-13 NOTE — Progress Notes (Signed)
Care Management & Coordination Services Pharmacy Note  01/13/2022 Name:  Megan Peterson MRN:  [id number] DOB:  [birth year]  Summary: -Reviewed medications; daughter [name] administers each day and adherence appears improved -Pt family reports sertraline 50 mg has not improved mood; they have not tried higher 100 mg dose yet  Recommendations/Changes made from today's visit: -Advised trial of sertraline 100 mg (2 tablets) daily per previous instruction from PCP; advised pt to call in 3-4 weeks with result  Follow up plan: -Pharmacist follow up PRN    Subjective: Megan Peterson is an 82 y.o. year old female who is a primary patient of Damita Dunnings, [name], MD.  The care coordination team was consulted for assistance with disease management and care coordination needs.    Engaged with patient by telephone for follow up visit.  Recent office visits: 12/12/21 Dr Damita Dunnings OV: f/u - worsening mood. Increase sertraline to 50 mg.  Recent consult visits: 12/07/21 Dr Elsworth Soho (Pulmonary): f/u - use O2 when walking and sleeping. No changes.  11/08/21 Palliative care: candidal skin infxn d/t poor hygiene, rx's nystatin powder.   Hospital visits: None in previous 6 months   Objective:  Lab Results  Component Value Date   CREATININE 1.18 06/27/2021   BUN 23 06/27/2021   GFR 43.77 (L) 06/27/2021   EGFR 49 (L) 02/18/2021   GFRNONAA >60 09/07/2020   GFRAA 58 (L) 02/04/2019   NA 140 06/27/2021   K 4.4 06/27/2021   CALCIUM 9.6 06/27/2021   CO2 31 06/27/2021   GLUCOSE 82 06/27/2021    Lab Results  Component Value Date/Time   GFR 43.77 (L) 06/27/2021 03:29 PM   GFR 42.59 (L) 02/04/2021 11:52 AM    Last diabetic Eye exam: No results found for: "HMDIABEYEEXA"  Last diabetic Foot exam: No results found for: "HMDIABFOOTEX"   Lab Results  Component Value Date   CHOL 266 (H) 02/04/2021   HDL 52.70 02/04/2021   LDLCALC 175 (H) 02/04/2021   LDLDIRECT 143.1 05/20/2012   TRIG 188.0 (H) 02/04/2021    CHOLHDL 5 02/04/2021       Latest Ref Rng & Units 06/27/2021    3:29 PM 02/04/2021   11:52 AM 12/16/2019    9:20 AM  Hepatic Function  Total Protein 6.0 - 8.3 g/dL 7.0  6.8  6.7   Albumin 3.5 - 5.2 g/dL 4.2  4.2  4.1   AST 0 - 37 U/L '19  18  23   '$ ALT 0 - 35 U/L '10  9  12   '$ Alk Phosphatase 39 - 117 U/L 107  105  98   Total Bilirubin 0.2 - 1.2 mg/dL 0.5  0.4  0.4     Lab Results  Component Value Date/Time   TSH 2.28 06/27/2021 03:29 PM   TSH 2.66 02/04/2021 11:52 AM       Latest Ref Rng & Units 06/27/2021    3:29 PM 02/04/2021   11:52 AM 09/07/2020   11:31 AM  CBC  WBC 4.0 - 10.5 K/uL 9.0  8.4  10.1   Hemoglobin 12.0 - 15.0 g/dL 13.1  12.4  14.2   Hematocrit 36.0 - 46.0 % 39.2  38.2  40.9   Platelets 150.0 - 400.0 K/uL 247.0  294.0  295     Lab Results  Component Value Date/Time   VD25OH 34.83 11/12/2018 07:54 AM   VD25OH 22.30 (L) 04/13/2017 12:52 PM   VITAMINB12 204 (L) 06/27/2021 03:29 PM   VITAMINB12 588 02/04/2019 10:12  AM    Clinical ASCVD: No  The ASCVD Risk score (Arnett DK, et al., 2019) failed to calculate for the following reasons:   The 2019 ASCVD risk score is only valid for ages 14 to 65       06/16/2021    3:51 PM 12/16/2019    8:31 AM 11/18/2018    1:58 PM  Depression screen PHQ 2/9  Decreased Interest 3 0 0  Down, Depressed, Hopeless 3 0 0  PHQ - 2 Score 6 0 0  Altered sleeping 0  0  Tired, decreased energy 1  0  Change in appetite 0  0  Feeling bad or failure about yourself  0  0  Trouble concentrating 0  0  Moving slowly or fidgety/restless 0  0  Suicidal thoughts 0  0  PHQ-9 Score 7  0  Difficult doing work/chores Somewhat difficult  Not difficult at all       12/16/2019    8:30 AM  GAD 7 : Generalized Anxiety Score  Nervous, Anxious, on Edge 0  Control/stop worrying 0  Worry too much - different things 0  Trouble relaxing 0  Restless 0  Easily annoyed or irritable 0  Afraid - awful might happen 0  Total GAD 7 Score 0  Anxiety  Difficulty Not difficult at all     Social History   Tobacco Use  Smoking Status Former   Years: 40.00   Types: Cigarettes   Quit date: 01/03/1991   Years since quitting: 31.0  Smokeless Tobacco Never   BP Readings from Last 3 Encounters:  12/12/21 112/60  12/07/21 126/72  11/08/21 130/70   Pulse Readings from Last 3 Encounters:  12/12/21 78  12/07/21 72  11/08/21 87   Wt Readings from Last 3 Encounters:  12/12/21 152 lb (68.9 kg)  12/07/21 147 lb (66.7 kg)  09/29/21 140 lb (63.5 kg)   BMI Readings from Last 3 Encounters:  12/12/21 34.08 kg/m  12/07/21 32.96 kg/m  09/29/21 28.28 kg/m    Allergies  Allergen Reactions   Buspar [Buspirone] Other (See Comments)    Dizzy and nauseated.   Cymbalta [Duloxetine Hcl] Other (See Comments)    fatigue   Lexapro [Escitalopram Oxalate] Other (See Comments)    Per patient, intolerant   Meperidine Hcl Other (See Comments)    unknown   Mirtazapine     Sedation, altered mentation.     Morphine And Related Other (See Comments)    Vomiting    Statins Other (See Comments)    Myalgias on statins    Medications Reviewed Today     Reviewed by Filbert Berthold, CMA (Certified Medical Assistant) on 12/12/21 at 1441  Med List Status: <None>   Medication Order Taking? Sig Documenting Provider Last Dose Status Informant  albuterol (ACCUNEB) 0.63 MG/3ML nebulizer solution [id number] Yes Take 3 mLs (0.63 mg total) by nebulization every 6 (six) hours as needed for wheezing. Rigoberto Noel, MD Taking Active   albuterol (VENTOLIN HFA) 108 (90 Base) MCG/ACT inhaler [id number] Yes INHALE 1-2 PUFFS INTO THE LUNGS EVERY 6 HOURS AS NEEDED FOR WHEEZING OR SHORTNESS OF BREATH Tonia Ghent, MD Taking Active   furosemide (LASIX) 20 MG tablet [id number] Yes TAKE 1 TABLET BY MOUTH ONCE DAILY AS NEEDED IN THE MORNING FOR FLUID Tonia Ghent, MD Taking Active   gabapentin (NEURONTIN) 800 MG tablet [id number] Yes Take 1 tablet (800 mg total) by  mouth 3 (three) times daily. Reed, Tiffany L, DO  Taking Active   HYDROcodone-acetaminophen (NORCO/VICODIN) 5-325 MG tablet [id number] Yes Take 1 tablet by mouth every 6 (six) hours as needed for moderate pain. Tonia Ghent, MD Taking Active   Ibuprofen-diphenhydrAMINE HCl (ADVIL PM) 200-25 MG CAPS [id number] Yes Take 1 tablet by mouth at bedtime. [provider] Taking Active   memantine (NAMENDA) 10 MG tablet [id number] Yes Take 1 tablet by mouth twice daily Pieter Partridge, DO Taking Active   metoprolol succinate (TOPROL-XL) 25 MG 24 hr tablet [id number] Yes Take 1 tablet (25 mg total) by mouth daily. Tonia Ghent, MD Taking Active   nitroGLYCERIN (NITROSTAT) 0.4 MG SL tablet [id number] Yes Place 0.4 mg under the tongue every 5 (five) minutes as needed for chest pain (max 3 doses in 15 minutes.  if still having chest pain then go to the ER). Reed, Tiffany L, DO Taking Active   nystatin (MYCOSTATIN/NYSTOP) powder [id number] Yes Apply 1 Application topically 3 (three) times daily. Reed, Tiffany L, DO Taking Active   ondansetron (ZOFRAN) 4 MG tablet [id number] Yes Take 4 mg by mouth every 8 (eight) hours as needed for nausea or vomiting. [provider] Taking Active   OXYGEN [id number] Yes Inhale 2 L into the lungs at bedtime. [provider] Taking Active   sertraline (ZOLOFT) 25 MG tablet [id number] Yes Take 1 tablet (25 mg total) by mouth daily. Tonia Ghent, MD Taking Active   triamcinolone cream (KENALOG) 0.5 % [id number] Yes APPLY  CREAM TOPICALLY TO AFFECTED AREA TWICE DAILY FOR  RASH  ON  LEGS Tonia Ghent, MD Taking Active   umeclidinium-vilanterol [facility] ELLIPTA) 62.5-25 MCG/ACT AEPB [id number] Yes Inhale 1 puff by mouth once daily Tonia Ghent, MD Taking Active   vitamin B-12 (CYANOCOBALAMIN) 1000 MCG tablet [id number] Yes Take 1 tablet (1,000 mcg total) by mouth daily. Tonia Ghent, MD Taking Active             SDOH:  (Social Determinants of  Health) assessments and interventions performed: Yes SDOH Interventions    Flowsheet Row Care Coordination from 01/13/2022 in [place] from 06/16/2021 in [place] at [place] [facility] from 07/28/2020 in [place] at [place] from 11/18/2018 in [place] at [place] Interventions      Housing Interventions Intervention Not Indicated -- -- --  Depression Interventions/Treatment  -- Currently on Treatment -- PHQ2-9 Score <4 Follow-up Not Indicated  Financial Strain Interventions Intervention Not Indicated -- Intervention Not Indicated --      SDOH Screenings   Food Insecurity: No Food Insecurity (06/16/2021)  Housing: Low Risk  (01/13/2022)  Transportation Needs: No Transportation Needs (06/16/2021)  Depression (PHQ2-9): Medium Risk (06/16/2021)  Financial Resource Strain: Low Risk  (01/13/2022)  Physical Activity: Inactive (06/16/2021)  Stress: No Stress Concern Present (06/16/2021)  Tobacco Use: Medium Risk (12/12/2021)    Medication Assistance: None required.  Patient affirms current coverage meets needs.  Medication Access: Within the past 30 days, how often has patient missed a dose of medication? 0 Is a pillbox or other method used to improve adherence? Yes  Factors that may affect medication adherence?  Memory impairment Are meds synced by current pharmacy? No  Are meds delivered by current pharmacy? No  Does patient experience delays in picking up medications due to transportation concerns? No   [facility] Services Reviewed: Is patient disadvantaged to use [facility]?: Yes  Current Rx insurance plan: [place] Name and location of  Current pharmacy:  [facility] [address] ([address]), [place] - [address] [place] [address] Phone: [phone] Fax: [phone]  [place], [place] [address] Phone: [phone] Fax: [phone]  [facility] Pharm[facility] services reviewed with patient today?: No  Patient requests to transfer care to [facility] Pharmacy?: No  Reason patient declined to change pharmacies: Disadvantaged due to insurance/mail order  Compliance/Adherence/Medication fill history: Care Gaps: None  Star-Rating Drugs: None   Assessment/Plan   Current Barriers:  Struggles to self administer medications as prescribed  Hypertension (BP goal <140/90) -Controlled -  per clinic visits -Home BP readings: not checking -Current treatment: Metoprolol succinate 25 mg daily - Appropriate, Effective, Safe, Accessible Furosemide 20 mg PRN -Appropriate, Effective, Safe, Accessible -Medications previously tried: none reported  -Educated on Importance of home blood pressure monitoring; -Counseled to monitor BP periodically -Recommend to continue current medication  Hyperlipidemia: (LDL goal < 70) -Uncontrolled - LDL 175 (02/2021), pt has not tolerated statins -Current treatment: Nitroglycerin 0.4 mg PRN - not used yet -Medications previously tried: rosuvastatin, atorvastatin (myalgia) -Recommended to continue current medication  COPD (Goal: control symptoms and prevent exacerbations) -Controlled - pt is compliant with Anoro per daughter -Followed by pulmonology  -Current treatment  Anoro Ellipta 1 puff daily - Appropriate, Effective, Safe, Accessible Albuterol HFA PRN -Appropriate, Effective, Safe, Accessible Oxygen (night) -Medications previously tried: none reported  -Reviewed benefits of consistent use of Anoro to prevent exacerbations -Recommended to continue current medication  Memory Impairment (Goal: Prevent memory decline) -Progressive impairment: pt continues to suffer cognitive impairment, her daughter takes on the majority of her care including medication management and bathing -Current treatment: Memantine 10 mg BID -  Appropriate, Effective, Safe, Accessible -Medications previously tried/failed: donepezil -Reviewed benefits of memantine - slowing decline, not improving memory -Reviewed risks of Advil PM - benadryl component can contribute to memory loss; pt has failed other sleep aids and has been instructed per PCP to continue Advil PM -Advised to limit use of Advil PM as much as possible  Anxiety / Irritability (Goal: improve symptoms) -Uncontrolled - pt family reports no improvement on sertraline 50 mg; they have not tried higher 100 mg dose as suggested previously by PCP -Pt Family reports irritability/aggravation, stubbornness; her moods are taking a toll on her caregivers -PHQ9: 7 (06/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 -GAD7: 0 (12/2019) - minimal anxiety -Connected with PCP for mental health support; also palliative care -Current treatment: Sertraline 50 mg daily - Appropriate, Query Effective -Medications previously tried/failed: duloxetine (fatigue), mirtazapine (drowsiness), lexapro (intolerance), buspar (dizziness) -Educated that medication options are limited - antidepressants are first line for behavioral/neurocognitive symptoms associated with dementia, but they are typically only helpful for depression; other options like antipsychotics, benzodiazepines should be avoided due to risks > benefit; discussed hydroxyzine trial for PRN agitation, but anticholinergics are also recommended to avoid due to possibility for worsening dementia; pt family insists they need to try something to help with moods -Recommend trial of sertraline 100 mg/day (take 2 tablets)  Health Maintenance -Vaccine gaps: Shingrix, TDAP   [name], PharmD, BCACP Clinical Pharmacist [facility] at [facility] [phone]

## 2022-01-13 NOTE — Telephone Encounter (Signed)
Daughter called in to let Dr Damita Dunnings know that insurance is only covering 7 days worth for medicationHYDROcodone-acetaminophen (NORCO/VICODIN) 5-325 MG tablet  ,after that he would have to send in another rx.

## 2022-01-13 NOTE — Patient Instructions (Signed)
Visit Information  Phone number for Pharmacist: [phone]  Thank you for meeting with me to discuss your medications! Below is a summary of what we talked about during the visit:   Recommendations/Changes made from today's visit: -Advised trial of sertraline 100 mg (2 tablets) daily per previous instruction from PCP; advised pt to call in 3-4 weeks with result  Follow up plan: -Pharmacist follow up PRN   [name], PharmD, BCACP Clinical Pharmacist [facility] [phone]

## 2022-01-14 NOTE — Telephone Encounter (Signed)
Noted.  I denied this refill request since it had been filled recently.  Thanks.

## 2022-01-14 NOTE — Telephone Encounter (Signed)
Please clarify with pharmacy.  According to [place] she had a prescription for 120 pills filled on January 12.

## 2022-01-16 NOTE — Telephone Encounter (Signed)
I spoke with patients daughter [name] and she did pick up the prescription at [facility] but insurance is not covering more then 7 days worth of med. She stated she paid for the rest of the medication out of pocket. [facility] is no longer allowing them to use goodrx card for this like they did before. I looked on goodrx and advised [name] that for patients next [facility] will [facility] and should be $17. She stated she is going to do this route next time.

## 2022-01-17 NOTE — Telephone Encounter (Signed)
Noted. Thanks.

## 2022-01-21 DIAGNOSIS — J9611 Chronic respiratory failure with hypoxia: Secondary | ICD-10-CM | POA: Diagnosis not present

## 2022-01-21 DIAGNOSIS — J432 Centrilobular emphysema: Secondary | ICD-10-CM | POA: Diagnosis not present

## 2022-01-28 DIAGNOSIS — J9611 Chronic respiratory failure with hypoxia: Secondary | ICD-10-CM | POA: Diagnosis not present

## 2022-02-08 DIAGNOSIS — J9611 Chronic respiratory failure with hypoxia: Secondary | ICD-10-CM | POA: Diagnosis not present

## 2022-02-10 ENCOUNTER — Telehealth: Payer: Self-pay | Admitting: Family Medicine

## 2022-02-10 NOTE — Telephone Encounter (Signed)
Patient daughter called in asking could rx for anora inhaler be called in for patient. I did not see this inhaler on her medication list.   [id number] [place] [address] Phone: [phone] Fax: [phone]

## 2022-02-10 NOTE — Telephone Encounter (Signed)
Looks like last rx for Anoro was sent in was 06/30/21; okay to refill?

## 2022-02-11 MED ORDER — ANORO ELLIPTA 62.5-25 MCG/ACT IN AEPB: INHALATION_SPRAY | RESPIRATORY_TRACT | 5 refills | Status: DC

## 2022-02-11 NOTE — Addendum Note (Signed)
Addended by: Tonia Ghent on: 02/11/2022 10:31 AM   Modules accepted: Orders

## 2022-02-11 NOTE — Telephone Encounter (Signed)
Sent. Thanks.   

## 2022-02-12 ENCOUNTER — Other Ambulatory Visit: Payer: Self-pay | Admitting: Family Medicine

## 2022-02-21 DIAGNOSIS — J432 Centrilobular emphysema: Secondary | ICD-10-CM | POA: Diagnosis not present

## 2022-02-21 DIAGNOSIS — J9611 Chronic respiratory failure with hypoxia: Secondary | ICD-10-CM | POA: Diagnosis not present

## 2022-02-28 ENCOUNTER — Telehealth: Payer: Self-pay | Admitting: Family Medicine

## 2022-02-28 DIAGNOSIS — J9611 Chronic respiratory failure with hypoxia: Secondary | ICD-10-CM | POA: Diagnosis not present

## 2022-02-28 MED ORDER — HYDROCODONE-ACETAMINOPHEN 5-325 MG PO TABS: 1.0000 | ORAL_TABLET | Freq: Four times a day (QID) | ORAL | 0 refills | Status: DC | PRN

## 2022-02-28 NOTE — Telephone Encounter (Signed)
Prescription Request  02/28/2022  Is this a "Controlled Substance" medicine? Yes  LOV: 12/12/2021  What is the name of the medication or equipment? HYDROcodone-acetaminophen (NORCO/VICODIN) 5-325 MG tablet   Have you contacted your pharmacy to request a refill? No   Which pharmacy would you like this sent to?  [facility] ([address] [place] [id number] [place]) [place] [address] Phone: [phone] Fax: [phone]   Patient notified that their request is being sent to the clinical staff for review and that they should receive a response within 2 business days.   Please advise at Mobile [phone] (mobile)

## 2022-02-28 NOTE — Telephone Encounter (Signed)
LOV - 12/12/21 NOV - not scheduled RF - 01/11/22 #120/0  *okay to send erx; [facility] not affected in outage.

## 2022-02-28 NOTE — Telephone Encounter (Signed)
Sent. Thanks.   

## 2022-03-02 ENCOUNTER — Telehealth: Payer: Self-pay | Admitting: Family Medicine

## 2022-03-02 NOTE — Telephone Encounter (Signed)
Error

## 2022-03-06 ENCOUNTER — Encounter: Payer: Self-pay | Admitting: Family Medicine

## 2022-03-06 ENCOUNTER — Ambulatory Visit (INDEPENDENT_AMBULATORY_CARE_PROVIDER_SITE_OTHER): Payer: Medicare HMO | Admitting: Family Medicine

## 2022-03-06 VITALS — BP 104/58 | HR 73 | Temp 98.1°F | Ht <= 58 in

## 2022-03-06 DIAGNOSIS — H6120 Impacted cerumen, unspecified ear: Secondary | ICD-10-CM

## 2022-03-06 DIAGNOSIS — H538 Other visual disturbances: Secondary | ICD-10-CM

## 2022-03-06 DIAGNOSIS — H539 Unspecified visual disturbance: Secondary | ICD-10-CM | POA: Diagnosis not present

## 2022-03-06 NOTE — Patient Instructions (Signed)
Let me know if you don't get a call in the next few days about seeing the eye clinic.  I put in the referral.  Take care.  Glad to see you.

## 2022-03-06 NOTE — Progress Notes (Addendum)
H/o memory loss noted.  Additional hx per daughter.   Hearing is worse recently.  Variable.  More trouble on the L side.    L eye blurry, unclear duration.  No change on the R per patient report.  Prev redness resolved.  No c/o HA over the last few weeks. No frank vision loss. Family noted she wasn't watching TV much over the last few months, noted in retrospect.    Meds, vitals, and allergies reviewed.   ROS: Per HPI unless specifically indicated in ROS section   Nad [place]ck supple, no LA Rrr Dec whisper hearing B.  Tone heard louder on L ear with normal testing but louder on R ear with Weber testing.   Right ear with cerumen impaction resolved with irrigation.  She consented for treatment.  Hearing improved after wax removal.  Recheck tympanic membrane normal. PERRL EOMI.  Limited funduscopic exam bilaterally since she is not dilated.

## 2022-03-07 ENCOUNTER — Encounter: Payer: Self-pay | Admitting: *Deleted

## 2022-03-09 ENCOUNTER — Telehealth: Payer: Self-pay | Admitting: Family Medicine

## 2022-03-09 DIAGNOSIS — H539 Unspecified visual disturbance: Secondary | ICD-10-CM

## 2022-03-09 DIAGNOSIS — J9611 Chronic respiratory failure with hypoxia: Secondary | ICD-10-CM | POA: Diagnosis not present

## 2022-03-09 DIAGNOSIS — H6120 Impacted cerumen, unspecified ear: Secondary | ICD-10-CM | POA: Insufficient documentation

## 2022-03-09 HISTORY — DX: Unspecified visual disturbance: H53.9

## 2022-03-09 NOTE — Telephone Encounter (Signed)
Please verify current dosing of sertraline- we had it listed as '50mg'$  daily.  If she is taking an alternate dose, then let me know.    It may be reasonable to increase sertraline but I need the update first.  Thanks.

## 2022-03-09 NOTE — Telephone Encounter (Signed)
Called and spoke to patients daughter she states she is till taking the sertraline twice daily and they would be willing to try something else to see if this would help her mood.

## 2022-03-09 NOTE — Assessment & Plan Note (Signed)
Resolved with irrigation.  Hearing improved.

## 2022-03-09 NOTE — Telephone Encounter (Signed)
We did not address her PHQ score at the office visit.  Is she is still taking sertraline?  Is the patient willing to change treatment to help her mood?  Please let me know.  Thanks.

## 2022-03-09 NOTE — Telephone Encounter (Signed)
[name] states its 50 mg tablets and she is giving one to patient at 8 am and one at 2 pm daily.

## 2022-03-10 DIAGNOSIS — H35372 Puckering of macula, left eye: Secondary | ICD-10-CM | POA: Diagnosis not present

## 2022-03-10 DIAGNOSIS — H26492 Other secondary cataract, left eye: Secondary | ICD-10-CM | POA: Diagnosis not present

## 2022-03-10 MED ORDER — HYDROCODONE-ACETAMINOPHEN 5-325 MG PO TABS: 1.0000 | ORAL_TABLET | Freq: Four times a day (QID) | ORAL | 0 refills | Status: DC | PRN

## 2022-03-10 MED ORDER — SERTRALINE HCL 50 MG PO TABS: 150.0000 mg | ORAL_TABLET | Freq: Every day | ORAL | Status: DC

## 2022-03-10 NOTE — Telephone Encounter (Signed)
Spoke with patients daughter and advised on rx dosing. She will have her mom try this and let us know if that does not help in a couple weeks.

## 2022-03-10 NOTE — Telephone Encounter (Signed)
Would try inc to '150mg'$  sertraline, would be okay to give as a single dose.  Please let me know if that isn't helping in a few weeks.  Thanks.

## 2022-03-10 NOTE — Addendum Note (Signed)
Addended by: Tonia Ghent on: 03/10/2022 03:32 PM   Modules accepted: Orders

## 2022-03-10 NOTE — Telephone Encounter (Signed)
Pt's daughter called& stated the pharmacy stated they never received the prescription for HYDROcodone-acetaminophen (NORCO/VICODIN) 5-325 MG tablet. Call back # [phone]

## 2022-03-10 NOTE — Addendum Note (Signed)
Addended by: Tonia Ghent on: 03/10/2022 07:38 AM   Modules accepted: Orders

## 2022-03-10 NOTE — Telephone Encounter (Signed)
Receipt confirmed by pharmacy (03/10/2022  3:32 PM EST

## 2022-03-10 NOTE — Telephone Encounter (Signed)
Sent. Please verify that it went through.  Thanks.

## 2022-03-22 DIAGNOSIS — J432 Centrilobular emphysema: Secondary | ICD-10-CM | POA: Diagnosis not present

## 2022-03-22 DIAGNOSIS — J9611 Chronic respiratory failure with hypoxia: Secondary | ICD-10-CM | POA: Diagnosis not present

## 2022-03-29 DIAGNOSIS — J9611 Chronic respiratory failure with hypoxia: Secondary | ICD-10-CM | POA: Diagnosis not present

## 2022-04-03 ENCOUNTER — Other Ambulatory Visit: Payer: Self-pay | Admitting: Family Medicine

## 2022-04-03 NOTE — Telephone Encounter (Signed)
Refill request for Gabapentin 800 MG Oral Tablet   LOV - 03/06/22 Next OV - not scheduled Last refill - 12/29/21 #270/0

## 2022-04-04 ENCOUNTER — Other Ambulatory Visit: Payer: Self-pay | Admitting: Family Medicine

## 2022-04-04 ENCOUNTER — Other Ambulatory Visit: Payer: Self-pay | Admitting: Neurology

## 2022-04-06 DIAGNOSIS — H26492 Other secondary cataract, left eye: Secondary | ICD-10-CM | POA: Diagnosis not present

## 2022-04-07 ENCOUNTER — Other Ambulatory Visit: Payer: Self-pay | Admitting: Family Medicine

## 2022-04-09 DIAGNOSIS — J9611 Chronic respiratory failure with hypoxia: Secondary | ICD-10-CM | POA: Diagnosis not present

## 2022-04-11 ENCOUNTER — Encounter: Payer: Self-pay | Admitting: Neurology

## 2022-04-13 DIAGNOSIS — H26492 Other secondary cataract, left eye: Secondary | ICD-10-CM | POA: Diagnosis not present

## 2022-04-14 ENCOUNTER — Telehealth: Payer: Self-pay | Admitting: Neurology

## 2022-04-14 NOTE — Telephone Encounter (Signed)
Patients daughter called the office to set an appt up for her mother. She was here 09/2021 and saw Jaffe. She was to f/u with sara in 1 yr. She feels her mother needs to be seen before her 10/2022 appt. She also left a message in her mychart.  Hello  Just wanted to let you know that mom has some symptoms that have changed. Maybe she should be seen sooner than October ? Her since of time has decreased  She hears knocking and sounds that are not their,  She  doesn't want to shower but will wash in sink.  Can't operate the telephone or remote usually,  Puts things away and blames others.  Hides things or doesn't remember that something is hers . The list goes on.  Should she be seen ? Thanks  [employer]

## 2022-04-17 ENCOUNTER — Ambulatory Visit: Payer: Medicare HMO | Admitting: Physician Assistant

## 2022-04-17 ENCOUNTER — Encounter: Payer: Self-pay | Admitting: Physician Assistant

## 2022-04-17 VITALS — BP 118/74 | HR 64 | Resp 18 | Ht <= 58 in

## 2022-04-17 DIAGNOSIS — G309 Alzheimer's disease, unspecified: Secondary | ICD-10-CM | POA: Diagnosis not present

## 2022-04-17 DIAGNOSIS — F0280 Dementia in other diseases classified elsewhere without behavioral disturbance: Secondary | ICD-10-CM | POA: Diagnosis not present

## 2022-04-17 DIAGNOSIS — R69 Illness, unspecified: Secondary | ICD-10-CM | POA: Diagnosis not present

## 2022-04-17 NOTE — Progress Notes (Addendum)
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 RH female  COPD, HLD, anxiety and chronic back pain and Dementia due to Alzheimer's Disease with Behavioral Disturbance seen today in follow up for memory loss. Patient is currently on memantine 10 mg daily.  Her memory is overall stable, but she has shown some irritability which is new, her daughters are trying to adjust to her new behavior.  They are entertaining the idea of home health nursing.    Follow up in 6 months. Continue Memantine 10 mg twice daily. Side effects were discussed  Medication management as per daughter Agree with home health nursing Recommend good control of cardiovascular risk factors.   Continue to control mood as per PCP     Subjective:    This patient is accompanied in the office by her daughter who supplements the history.  Previous records as well as any outside records available were reviewed prior to todays visit. Patient was last seen on 09/29/21. Last SLUMS on 09/18/19 was 10/30    Any changes in memory since last visit?" Cannot operate the phone or remote".  "Sometimes she does not recognize her own possessions " She enjoys watching TV and Westerns ( [place]) repeats oneself?  Endorsed Disoriented when walking into a room?  occasionally. Leaving objects in unusual places? "She puts things away and blames others"-daughter says.  Wandering behavior?  About 6 months was going to the froth yard but came from the back door   Uses a walker for stability at home.    Any personality changes since last visit? When she gets upset you cannot convince her otherwise so I just let her be "-daughter says  Any worsening depression?: "She may have crying spells when mad" Hallucinations or paranoia?  She hears knocking and other sounds that are not there.  "A few months back she was seeing people on the TV screen or things crawling but has  gone away   " Seizures?    denies    Any sleep changes?  Denies vivid dreams, REM behavior or sleepwalking . She sleeps in a recliner. Does not wear her O2 at night Uses nebs Sleep apnea?   denies   Any hygiene concerns?    "Does not want to shower, but will wash in the sink" she will only take a shower about once a month Independent of bathing and dressing?  Endorsed  Does the patient needs help with medications?  Daughter is in charge Who is in charge of the finances?  Daughter is in charge Any changes in appetite Wants to eat again because she forgets that she ate.  Patient have trouble swallowing?  denies   Does the patient cook?  She cooked potato's and burned the about 2 weeks ago   Any headaches?   Had headaches on and off for 2 months due to visual issues with cataracts , but have gone away  Chronic back pain  denies   Ambulates with difficulty?   She is on a wheelchair.  Only uses a walker at home Recent falls or head injuries? A few months ago she missed the step and fell on her face but no LOC, "but had a black eye" Unilateral weakness, numbness or tingling?    denies   Any anosmia?  Patient denies   Any incontinence of urine? Needs pads  because she cannot make it fast  Any bowel dysfunction?  "  Some" Patient lives  with her daughter  Does the patient drive? No longer drives   HISTORY: She started noticing memory problems in late 2019-early 2020.  At first, she forgot about food that she cooked.  She started forgetting to take her medications.  Her daughter now has to call and remind her three times a day.  She started frequently misplacing objects. .  TSH from October 2020 was 1.90.  Labs from February 2021 include B12 588, folate 17.7, MMA 200, homocysteine 15, and RPR non-reactive.  She had an MRI of the brain without contrast on 03/11/2019 which was personally reviewed and showed mild chronic small vessel ischemic changes but otherwise unremarkable. She lives alone.  She goes  grocery shopping.  She drives to the grocery store about 2 miles away.  She drives to church 7 miles away.  She drives to her daughter 12 miles away.  Sometimes she will drive to see her son and grandchildren who are 30 miles away.  Her daughter has rode in the passenger seat with her and has not been concerned about her driving.  She manages her own finances.  However, her daughter looked over her bills and noted that she had paid some bills twice and forgot to pay other bills.  She keeps her home clean.  She reports that she does not leave on the stove.  She socializes with her neighbors everyday.  She sleeps well.  Her appetite is good.  She denies depression.   Family history: Her sister may have dementia but not officially diagnosed.  Maternal uncle may have had dementia.  Her mother passed away at age 62.  Her father passed away at younger age.   PREVIOUS MEDICATIONS: Donepezil ("too drowsy during the day")  CURRENT MEDICATIONS:  Outpatient Encounter Medications as of 04/17/2022  Medication Sig   albuterol (ACCUNEB) 0.63 MG/3ML nebulizer solution Take 3 mLs (0.63 mg total) by nebulization every 6 (six) hours as needed for wheezing.   albuterol (VENTOLIN HFA) 108 (90 Base) MCG/ACT inhaler INHALE 1 TO 2 PUFFS BY MOUTH EVERY 6 HOURS AS NEEDED FOR WHEEZING FOR SHORTNESS OF BREATH   furosemide (LASIX) 20 MG tablet TAKE 1 TABLET BY MOUTH ONCE DAILY AS NEEDED IN THE MORNING FOR FLUID   gabapentin (NEURONTIN) 800 MG tablet TAKE 1 TABLET BY MOUTH THREE TIMES DAILY   HYDROcodone-acetaminophen (NORCO/VICODIN) 5-325 MG tablet Take 1 tablet by mouth every 6 (six) hours as needed for moderate pain.   Ibuprofen-diphenhydrAMINE HCl (ADVIL PM) 200-25 MG CAPS Take 1 tablet by mouth at bedtime.   memantine (NAMENDA) 10 MG tablet Take 1 tablet by mouth twice daily   metoprolol succinate (TOPROL-XL) 25 MG 24 hr tablet Take 1 tablet (25 mg total) by mouth daily.   nitroGLYCERIN (NITROSTAT) 0.4 MG SL tablet Place 0.4  mg under the tongue every 5 (five) minutes as needed for chest pain (max 3 doses in 15 minutes.  if still having chest pain then go to the ER).   nystatin (MYCOSTATIN/NYSTOP) powder Apply 1 Application topically 3 (three) times daily.   ondansetron (ZOFRAN) 4 MG tablet Take 4 mg by mouth every 8 (eight) hours as needed for nausea or vomiting.   OXYGEN Inhale 2 L into the lungs at bedtime.   sertraline (ZOLOFT) 50 MG tablet Take 1 tablet by mouth once daily   triamcinolone cream (KENALOG) 0.5 % APPLY  CREAM TOPICALLY TO AFFECTED AREA TWICE DAILY FOR  RASH  ON  LEGS   umeclidinium-vilanterol (ANORO  ELLIPTA) 62.5-25 MCG/ACT AEPB Inhale 1 puff by mouth once daily   vitamin B-12 (CYANOCOBALAMIN) 1000 MCG tablet Take 1 tablet (1,000 mcg total) by mouth daily.   No facility-administered encounter medications on file as of 04/17/2022.       02/04/2019   10:09 AM 11/18/2018    2:00 PM 04/13/2017   12:17 PM  MMSE - Mini Mental State Exam  Orientation to time Orientation to Place Registration Attention/ Calculation 2 5 0  Recall Language- name 2 objects 2  0  Language- repeat Language- follow 3 step command 3  3  Language- read & follow direction 1  0  Write a sentence 1  0  Copy design 0  0  Total score 25  20       No data to display          Objective:     PHYSICAL EXAMINATION:    VITALS:   Vitals:   04/17/22 1104  Pulse: 64  Resp: 18  SpO2: 92%  Height:  (1.422 m)    GEN:  The patient appears stated age and is in NAD. HEENT:  Normocephalic, atraumatic.   Neurological examination:  General: NAD, well-groomed, appears stated age. Orientation: The patient is alert. Oriented to person, place not to Cranial nerves: There is good facial symmetry.The speech is fluent and clear. No aphasia or dysarthria. Fund of knowledge is appropriate. Recent and remote memory are impaired. Attention and concentration are reduced.  Able to name objects  and repeat phrases.  Hearing is intact to conversational tone.   Sensation: Sensation is intact to light touch throughout Motor: Strength is at least antigravity x4. DTR's 2/4 in UE/LE     Movement examination: Tone: There is normal tone in the UE/LE Abnormal movements:  no tremor.  No myoclonus.  No asterixis.   Coordination:  There is no decremation with RAM's. Normal finger to nose  Gait and Station:Wheelchair dependent, gait not tested.    Thank you for allowing [place] the opportunity to participate in the care of this nice patient. Please do not hesitate to contact us for any questions or concerns.   Total time spent on today's visit was 37 minutes dedicated to this patient today, preparing to see patient, examining the patient, ordering tests and/or medications and counseling the patient, documenting clinical information in the EHR or other health record, independently interpreting results and communicating results to the patient/family, discussing treatment and goals, answering patient's questions and coordinating care.  Cc:  Joaquim Nam, MD  Marlowe Kays 04/17/2022 12:29 PM

## 2022-04-17 NOTE — Patient Instructions (Signed)
memantine 10mg  tablet: twice a day  Routine exercise Mental exercises (puzzles, brain teasers) Socialization Recommend adult day program and HHN  Follow up in 6 months    Mediterranean Diet A Mediterranean diet refers to food and lifestyle choices that are based on the traditions of countries located on the [employer]. It focuses on eating more fruits, vegetables, whole grains, beans, nuts, seeds, and heart-healthy fats, and eating less dairy, meat, eggs, and processed foods with added sugar, salt, and fat. This way of eating has been shown to help prevent certain conditions and improve outcomes for people who have chronic diseases, like kidney disease and heart disease. What are tips for following this plan? Reading food labels Check the serving size of packaged foods. For foods such as rice and pasta, the serving size refers to the amount of cooked product, not dry. Check the total fat in packaged foods. Avoid foods that have saturated fat or trans fats. Check the ingredient list for added sugars, such as corn syrup. Shopping  Buy a variety of foods that offer a balanced diet, including: Fresh fruits and vegetables (produce). Grains, beans, nuts, and seeds. Some of these may be available in unpackaged forms or large amounts (in bulk). Fresh seafood. Poultry and eggs. Low-fat dairy products. Buy whole ingredients instead of prepackaged foods. Buy fresh fruits and vegetables in-season from local farmers markets. Buy plain frozen fruits and vegetables. If you do not have access to quality fresh seafood, buy precooked frozen shrimp or canned fish, such as tuna, salmon, or sardines. Stock your pantry so you always have certain foods on hand, such as olive oil, canned tuna, canned tomatoes, rice, pasta, and beans. Cooking Cook foods with extra-virgin olive oil instead of using butter or other vegetable oils. Have meat as a side dish, and have vegetables or grains as your main dish.  This means having meat in small portions or adding small amounts of meat to foods like pasta or stew. Use beans or vegetables instead of meat in common dishes like chili or lasagna. Experiment with different cooking methods. Try roasting, broiling, steaming, and sauting vegetables. Add frozen vegetables to soups, stews, pasta, or rice. Add nuts or seeds for added healthy fats and plant protein at each meal. You can add these to yogurt, salads, or vegetable dishes. Marinate fish or vegetables using olive oil, lemon juice, garlic, and fresh herbs. Meal planning Plan to eat one vegetarian meal one day each week. Try to work up to two vegetarian meals, if possible. Eat seafood two or more times a week. Have healthy snacks readily available, such as: Vegetable sticks with hummus. Greek yogurt. Fruit and nut trail mix. Eat balanced meals throughout the week. This includes: Fruit: 2-3 servings a day. Vegetables: 4-5 servings a day. Low-fat dairy: 2 servings a day. Fish, poultry, or lean meat: 1 serving a day. Beans and legumes: 2 or more servings a week. Nuts and seeds: 1-2 servings a day. Whole grains: 6-8 servings a day. Extra-virgin olive oil: 3-4 servings a day. Limit red meat and sweets to only a few servings a month. Lifestyle  Cook and eat meals together with your family, when possible. Drink enough fluid to keep your urine pale yellow. Be physically active every day. This includes: Aerobic exercise like running or swimming. Leisure activities like gardening, walking, or housework. Get 7-8 hours of sleep each night. If recommended by your health care provider, drink red wine in moderation. This means 1 glass a day for nonpregnant  women and 2 glasses a day for men. A glass of wine equals 5 oz (150 mL). What foods should I eat? Fruits Apples. Apricots. Avocado. Berries. Bananas. Cherries. Dates. Figs. Grapes. Lemons. Melon. Oranges. Peaches. Plums.  Pomegranate. Vegetables Artichokes. Beets. Broccoli. Cabbage. Carrots. Eggplant. Green beans. Chard. Kale. Spinach. Onions. Leeks. Peas. Squash. Tomatoes. Peppers. Radishes. Grains Whole-grain pasta. Brown rice. Bulgur wheat. Polenta. Couscous. Whole-wheat bread. Orpah Cobb. Meats and other proteins Beans. Almonds. Sunflower seeds. Pine nuts. Peanuts. Cod. Salmon. Scallops. Shrimp. Tuna. Tilapia. Clams. Oysters. Eggs. Poultry without skin. Dairy Low-fat milk. Cheese. Greek yogurt. Fats and oils Extra-virgin olive oil. Avocado oil. Grapeseed oil. Beverages Water. Red wine. Herbal tea. Sweets and desserts Greek yogurt with honey. Baked apples. Poached pears. Trail mix. Seasonings and condiments Basil. Cilantro. Coriander. Cumin. Mint. Parsley. Sage. Rosemary. Tarragon. Garlic. Oregano. Thyme. Pepper. Balsamic vinegar. Tahini. Hummus. Tomato sauce. Olives. Mushrooms. The items listed above may not be a complete list of foods and beverages you can eat. Contact a dietitian for more information. What foods should I limit? This is a list of foods that should be eaten rarely or only on special occasions. Fruits Fruit canned in syrup. Vegetables Deep-fried potatoes (french fries). Grains Prepackaged pasta or rice dishes. Prepackaged cereal with added sugar. Prepackaged snacks with added sugar. Meats and other proteins Beef. Pork. Lamb. Poultry with skin. Hot dogs. Tomasa Blase. Dairy Ice cream. Sour cream. Whole milk. Fats and oils Butter. Canola oil. Vegetable oil. Beef fat (tallow). Lard. Beverages Juice. Sugar-sweetened soft drinks. Beer. Liquor and spirits. Sweets and desserts Cookies. Cakes. Pies. Candy. Seasonings and condiments Mayonnaise. Pre-made sauces and marinades. The items listed above may not be a complete list of foods and beverages you should limit. Contact a dietitian for more information. Summary The Mediterranean diet includes both food and lifestyle choices. Eat a  variety of fresh fruits and vegetables, beans, nuts, seeds, and whole grains. Limit the amount of red meat and sweets that you eat. If recommended by your health care provider, drink red wine in moderation. This means 1 glass a day for nonpregnant women and 2 glasses a day for men. A glass of wine equals 5 oz (150 mL). This information is not intended to replace advice given to you by your health care provider. Make sure you discuss any questions you have with your health care provider. Document Revised: 01/24/2019 Document Reviewed: 11/21/2018 Elsevier Patient Education  2022 [employer].

## 2022-04-22 DIAGNOSIS — J432 Centrilobular emphysema: Secondary | ICD-10-CM | POA: Diagnosis not present

## 2022-04-22 DIAGNOSIS — J9611 Chronic respiratory failure with hypoxia: Secondary | ICD-10-CM | POA: Diagnosis not present

## 2022-04-29 DIAGNOSIS — J9611 Chronic respiratory failure with hypoxia: Secondary | ICD-10-CM | POA: Diagnosis not present

## 2022-05-03 ENCOUNTER — Other Ambulatory Visit: Payer: Self-pay | Admitting: Family Medicine

## 2022-05-04 DIAGNOSIS — H26492 Other secondary cataract, left eye: Secondary | ICD-10-CM | POA: Diagnosis not present

## 2022-05-09 DIAGNOSIS — J9611 Chronic respiratory failure with hypoxia: Secondary | ICD-10-CM | POA: Diagnosis not present

## 2022-05-10 ENCOUNTER — Encounter: Payer: Self-pay | Admitting: Family Medicine

## 2022-05-10 ENCOUNTER — Other Ambulatory Visit: Payer: Self-pay | Admitting: *Deleted

## 2022-05-10 ENCOUNTER — Ambulatory Visit (INDEPENDENT_AMBULATORY_CARE_PROVIDER_SITE_OTHER): Payer: Medicare HMO | Admitting: Family Medicine

## 2022-05-10 VITALS — BP 110/70 | HR 80 | Temp 98.3°F | Ht <= 58 in | Wt 148.4 lb

## 2022-05-10 DIAGNOSIS — M545 Low back pain, unspecified: Secondary | ICD-10-CM

## 2022-05-10 DIAGNOSIS — B372 Candidiasis of skin and nail: Secondary | ICD-10-CM

## 2022-05-10 MED ORDER — PREDNISONE 20 MG PO TABS: ORAL_TABLET | ORAL | 0 refills | Status: DC

## 2022-05-10 MED ORDER — NYSTATIN 100000 UNIT/GM EX POWD: 1.0000 | Freq: Three times a day (TID) | CUTANEOUS | 3 refills | Status: DC

## 2022-05-10 MED ORDER — HYDROCODONE-ACETAMINOPHEN 5-325 MG PO TABS: 1.0000 | ORAL_TABLET | Freq: Four times a day (QID) | ORAL | 0 refills | Status: DC | PRN

## 2022-05-10 NOTE — Telephone Encounter (Signed)
Sent. Thanks.   

## 2022-05-10 NOTE — Telephone Encounter (Signed)
Patient was in seeing Dr. Patsy Lager today and requested refills on her Norco an Nystatin Powder.  Last refilled Norco 03/10/2022 for #120 with no refills.  Nystatin: 12/12/2021 for 30 g with 3 refills.    Next Appt: No future appointments with PCP.

## 2022-05-10 NOTE — Progress Notes (Signed)
Megan Mertz T. Nakai Yard, MD, CAQ Sports Medicine [facility] [address] [place], [address]  Phone: [phone]  FAX: [phone]  Megan Peterson - 82 y.o. female  MRN [id number]  Date of Birth: 05-30-1940  Date: 05/10/2022  PCP: Joaquim Nam, MD  Referral: Joaquim Nam, MD  Chief Complaint  Patient presents with   Hip Pain   Cough    "Cramps" Right Hip   Subjective:   Megan Peterson is a 82 y.o. very pleasant female patient with Body mass index is 33.26 kg/m. who presents with the following:  Having some back pain and posterior lateral hip.  She has been having pain for 3 days.  She is accompanied by her daughter, who provides additional history and much of the history itself.  She has not done anything she can think of to flare her back or hip up. -No prior major injuries in the past no prior fracture, dislocation, or operative intervention.  Long-term wheelchair, but now using only a walker at home.  She is in a wheelchair in the office today.  This is her norm now when leaving the house.  Moving around, heating pad, extra meds. -She has required additional Vicodin. Takes Gabapentin 800 mg TID Vicodin 3-4 times a day, but this is normal.   No numbness, weakness, tingling or anesthesia.  R lateral hip and L4-s1 back  Review of Systems is noted in the HPI, as appropriate  Objective:   BP 110/70 (BP Location: Right Arm, Patient Position: Sitting, Cuff Size: Large)   Pulse 80   Temp 98.3 F (36.8 C) (Temporal)   Ht 4\' 8"  (1.422 m)   Wt 148 lb 6 oz (67.3 kg)   SpO2 93%   BMI 33.26 kg/m   GEN: No acute distress; alert,appropriate. PULM: Breathing comfortably in no respiratory distress PSYCH: Normally interactive.   She was examined in a wheelchair.  She does have tenderness along the right thoracic spine through the lumbar spine. Tenderness at the GTB on the right Tenderness in the posterior pelvis on the  right  Full extension at the knee and flexion to 120.  No effusion. Excellent rotational maneuvers of the right hip without significant pain with terminal motion Sensation is intact throughout as well as motor function.  Neurovascular intact  Laboratory and Imaging Data:  Assessment and Plan:     ICD-10-CM   1. Acute right-sided low back pain without sciatica  M54.50      Acute on chronic low back pain with exacerbation. She is going to continue with topicals, and I am getting give her a burst of some steroids to take.  She can continue with over-the-counter medications.  This is only been present for 3 days, so I would anticipate it will correct quickly.  Medication Management during today's office visit: Meds ordered this encounter  Medications   predniSONE (DELTASONE) 20 MG tablet    Sig: 2 tabs po daily for 5 days, then 1 tab po daily for 5 days    Dispense:  15 tablet    Refill:  0   There are no discontinued medications.  Orders placed today for conditions managed today: No orders of the defined types were placed in this encounter.   Disposition: No follow-ups on file.  Dragon [employer] One speech-to-text software was used for transcription in this dictation.  Possible transcriptional errors can occur using Animal nutritionist.   Signed,  Elpidio Galea. Dulcey Riederer, MD  Outpatient Encounter Medications as of 05/10/2022  Medication Sig   albuterol (ACCUNEB) 0.63 MG/3ML nebulizer solution Take 3 mLs (0.63 mg total) by nebulization every 6 (six) hours as needed for wheezing.   albuterol (VENTOLIN HFA) 108 (90 Base) MCG/ACT inhaler INHALE 1 TO 2 PUFFS BY MOUTH EVERY 6 HOURS AS NEEDED FOR WHEEZING FOR SHORTNESS OF BREATH   furosemide (LASIX) 20 MG tablet TAKE 1 TABLET BY MOUTH ONCE DAILY AS NEEDED IN THE MORNING FOR FLUID   gabapentin (NEURONTIN) 800 MG tablet TAKE 1 TABLET BY MOUTH THREE TIMES DAILY   Ibuprofen-diphenhydrAMINE HCl (ADVIL PM) 200-25 MG CAPS Take 1 tablet by  mouth at bedtime.   memantine (NAMENDA) 10 MG tablet Take 1 tablet by mouth twice daily   metoprolol succinate (TOPROL-XL) 25 MG 24 hr tablet Take 1 tablet (25 mg total) by mouth daily.   nitroGLYCERIN (NITROSTAT) 0.4 MG SL tablet Place 0.4 mg under the tongue every 5 (five) minutes as needed for chest pain (max 3 doses in 15 minutes.  if still having chest pain then go to the ER).   ondansetron (ZOFRAN) 4 MG tablet Take 4 mg by mouth every 8 (eight) hours as needed for nausea or vomiting.   OXYGEN Inhale 2 L into the lungs at bedtime.   predniSONE (DELTASONE) 20 MG tablet 2 tabs po daily for 5 days, then 1 tab po daily for 5 days   sertraline (ZOLOFT) 50 MG tablet Take 1 tablet by mouth once daily   triamcinolone cream (KENALOG) 0.5 % APPLY  CREAM TOPICALLY TO AFFECTED AREA TWICE DAILY FOR  RASH  ON  LEGS   umeclidinium-vilanterol (ANORO ELLIPTA) 62.5-25 MCG/ACT AEPB Inhale 1 puff by mouth once daily   vitamin B-12 (CYANOCOBALAMIN) 1000 MCG tablet Take 1 tablet (1,000 mcg total) by mouth daily.   [DISCONTINUED] HYDROcodone-acetaminophen (NORCO/VICODIN) 5-325 MG tablet Take 1 tablet by mouth every 6 (six) hours as needed for moderate pain.   [DISCONTINUED] nystatin (MYCOSTATIN/NYSTOP) powder Apply 1 Application topically 3 (three) times daily.   No facility-administered encounter medications on file as of 05/10/2022.

## 2022-05-19 ENCOUNTER — Encounter: Payer: Self-pay | Admitting: Family Medicine

## 2022-05-22 ENCOUNTER — Other Ambulatory Visit: Payer: Self-pay | Admitting: Family Medicine

## 2022-05-22 DIAGNOSIS — J9611 Chronic respiratory failure with hypoxia: Secondary | ICD-10-CM | POA: Diagnosis not present

## 2022-05-22 DIAGNOSIS — R413 Other amnesia: Secondary | ICD-10-CM

## 2022-05-22 DIAGNOSIS — J432 Centrilobular emphysema: Secondary | ICD-10-CM | POA: Diagnosis not present

## 2022-05-24 ENCOUNTER — Other Ambulatory Visit: Payer: Medicare Other

## 2022-05-24 ENCOUNTER — Other Ambulatory Visit: Payer: Self-pay | Admitting: Family Medicine

## 2022-05-24 DIAGNOSIS — R413 Other amnesia: Secondary | ICD-10-CM

## 2022-05-25 ENCOUNTER — Telehealth: Payer: Self-pay | Admitting: *Deleted

## 2022-05-25 NOTE — Progress Notes (Signed)
  Care Coordination   Note   05/25/2022 Name: ALEK LAISURE MRN: [id number] DOB: [birth year]  Megan Peterson is a 82 y.o. year old female who sees Joaquim Nam, MD for primary care. I reached out to Megan Peterson by phone today to offer care coordination services.  Ms. Bomberger was given information about Care Coordination services today including:   The Care Coordination services include support from the care team which includes your Nurse Coordinator, Clinical Social Worker, or Pharmacist.  The Care Coordination team is here to help remove barriers to the health concerns and goals most important to you. Care Coordination services are voluntary, and the patient may decline or stop services at any time by request to their care team member.   Care Coordination Consent Status: Patient agreed to services and verbal consent obtained.   Follow up plan:  Telephone appointment with care coordination team member scheduled for:  05/30/2022  Encounter Outcome:  Pt. Scheduled from referral   Burman Nieves, [facility] Care Coordination Care Guide Direct Dial: [phone]

## 2022-05-29 DIAGNOSIS — J9611 Chronic respiratory failure with hypoxia: Secondary | ICD-10-CM | POA: Diagnosis not present

## 2022-05-30 ENCOUNTER — Ambulatory Visit: Payer: Self-pay

## 2022-05-30 NOTE — Patient Instructions (Signed)
Visit Information  Thank you for taking time to visit with me today. Please don't hesitate to contact me if I can be of assistance to you.   Following are the goals we discussed today:   Goals Addressed   None      If you are experiencing a Mental Health or Behavioral Health Crisis or need someone to talk to, please call 911  Patient verbalizes understanding of instructions and care plan provided today and agrees to view in MyChart. Active MyChart status and patient understanding of how to access instructions and care plan via MyChart confirmed with patient.     No further follow up required:      Ariaunna Longsworth, BSW Social Worker [facility] Care Management  [phone]  

## 2022-05-30 NOTE — Patient Outreach (Signed)
  Care Coordination   Initial Visit Note   05/30/2022 Name: Megan Peterson MRN: [id number] DOB: 10/16/1940  Megan Peterson is a 82 y.o. year old female who sees Joaquim Nam, MD for primary care. I spoke with  Megan Peterson daughter Megan Peterson by phone today.  What matters to the patients health and wellness today?  Patient has health and cognitive issues.  Daughter is the  caregiver and wants an assessment of patients needs and personal care.  [facility] is scheduled 5/30 to visit to complete the assessment.  Daughter will address request with [facility] and call SW back if needed.     Goals Addressed   None     SDOH assessments and interventions completed:  Yes  SDOH Interventions Today    Flowsheet Row Most Recent Value  SDOH Interventions   Food Insecurity Interventions Intervention Not Indicated  Housing Interventions Intervention Not Indicated        Care Coordination Interventions:  Yes, provided  Interventions Today    Flowsheet Row Most Recent Value  Chronic Disease   Chronic disease during today's visit Chronic Obstructive Pulmonary Disease (COPD), Hypertension (HTN)  General Interventions   General Interventions Discussed/Reviewed General Interventions Discussed  [Patient is working with Physicist, medical, needs assessment for personal needs]       Follow up plan: No further intervention required.   Encounter Outcome:  Pt. Visit Completed

## 2022-06-01 ENCOUNTER — Other Ambulatory Visit: Payer: Medicare HMO

## 2022-06-01 DIAGNOSIS — Z515 Encounter for palliative care: Secondary | ICD-10-CM

## 2022-06-01 NOTE — Progress Notes (Signed)
COMMUNITY PALLIATIVE CARE SW NOTE  PATIENT NAME: Megan Peterson DOB: [birth year] MRN: [id number]  PRIMARY CARE PROVIDER: Joaquim Nam, MD  RESPONSIBLE PARTY:  Acct ID - Guarantor Home Phone Work Phone Relationship Acct Type  [id number] Megan Peterson, Megan Peterson [phone]  Self P/F     [address], [place], [place] [address]   Palliative Care Visit/Clinical Social Work Patient was sitting in a chair in the family room.  Patient reported some pain in left arm, which she leans to that side. Patient couldn't raise her arm or hold her drink.  Patient has prescription medication that manages this.However, her  daughter have to watch her take medications.Patient often reports headaches She is sleeping a lot more over the last month. She has profound jerking and shaking in hands intermittently. Uses sippy cup instead of using a regular cup due to shaking.   Patient is eating at least 2 good meals a day to include snacks. Patient has an o2 concentrator that she will use  as needed (2 to 2  L) via nasal cannula. However she often refuses to put it on. She has a nebulizer, rescue inhaler and a long-acting inhaler. Patient is easily fatigue with any movement.   Patient has incontinent accidents and uses depends. She will carry soiled undergarments in her pockets. She is not allowing her daughter to assist with personal care.  She is having increased confusion. Patient has refused to go [facility]. She is very defensive with her daughter and often does not recognize her daughter.  Daughter having difficulty managing medications. She has reached out to the insurance and they will cover some in-home care-daughter to seek out agencies.  Patient sleeps in her recliner all night and her daughter recently purchased new one.Daughter has a camera set up for safety. Patient has sores that that she picks the scabs off, but is prescribed medications.  She ambulates independently and she holds on the walls and  furniture, refuses to use her walker.   Patient's daughter-[name] was tearful as she recently loss her first grandson to a drug overdose. She is also fatigued as a caregiver for her husband and her mother. She has limited support from her brother and children. SW provided supportive counseling, encouraged her to consider placement or at minimum a respite for patient. SW also encouraged her to get some in-home care for patient as she now has insurance approval.  Goal: to keep patient at home.  Follow-up scheduled for 06/08/22 @ 2:30 pm.   Social History   Tobacco Use   Smoking status: Former    Years: 40    Types: Cigarettes    Quit date: 01/03/1991    Years since quitting: 31.4   Smokeless tobacco: Never  Substance Use Topics   Alcohol use: No    CODE STATUS: Full Code ADVANCED DIRECTIVES: No MOST FORM COMPLETE: Yes HOSPICE EDUCATION PROVIDED: No  Duration of visit and documentation: 75 minutes  Analese Sovine, LCSW

## 2022-06-02 ENCOUNTER — Encounter: Payer: Self-pay | Admitting: Family Medicine

## 2022-06-04 ENCOUNTER — Other Ambulatory Visit: Payer: Self-pay | Admitting: Family Medicine

## 2022-06-04 DIAGNOSIS — R413 Other amnesia: Secondary | ICD-10-CM

## 2022-06-05 ENCOUNTER — Other Ambulatory Visit: Payer: Self-pay | Admitting: Family Medicine

## 2022-06-05 ENCOUNTER — Telehealth: Payer: Self-pay | Admitting: *Deleted

## 2022-06-05 DIAGNOSIS — R413 Other amnesia: Secondary | ICD-10-CM

## 2022-06-05 NOTE — Progress Notes (Signed)
  Care Coordination   Note   06/05/2022 Name: Megan Peterson MRN: [id number] DOB: Oct 26, 1940  Megan Peterson is a 82 y.o. year old female who sees Joaquim Nam, MD for primary care. I reached out to Megan Peterson by phone today to offer care coordination services.  [name] was given information about Care Coordination services today including:   The Care Coordination services include support from the care team which includes your Nurse Coordinator, Clinical Social Worker, or Pharmacist.  The Care Coordination team is here to help remove barriers to the health concerns and goals most important to you. Care Coordination services are voluntary, and the patient may decline or stop services at any time by request to their care team member.   Care Coordination Consent Status: Patient agreed to services and verbal consent obtained.   Follow up plan:  Telephone appointment with care coordination team member scheduled for:  06/05/22  Encounter Outcome:  Pt. Scheduled  [facility] Coordination Care Guide  Direct Dial: [phone]

## 2022-06-06 ENCOUNTER — Encounter: Payer: Self-pay | Admitting: *Deleted

## 2022-06-08 ENCOUNTER — Other Ambulatory Visit: Payer: Medicare HMO

## 2022-06-08 DIAGNOSIS — Z515 Encounter for palliative care: Secondary | ICD-10-CM

## 2022-06-09 DIAGNOSIS — J9611 Chronic respiratory failure with hypoxia: Secondary | ICD-10-CM | POA: Diagnosis not present

## 2022-06-16 ENCOUNTER — Encounter: Payer: Self-pay | Admitting: Family Medicine

## 2022-06-16 ENCOUNTER — Telehealth (INDEPENDENT_AMBULATORY_CARE_PROVIDER_SITE_OTHER): Payer: Medicare HMO | Admitting: Family Medicine

## 2022-06-16 VITALS — HR 92 | Ht <= 58 in | Wt 140.0 lb

## 2022-06-16 DIAGNOSIS — R413 Other amnesia: Secondary | ICD-10-CM

## 2022-06-16 NOTE — Progress Notes (Signed)
Virtual visit completed through WebEx or similar program Patient location: home  Provider location: [place] at [facility], office  Participants: Patient and me (unless stated otherwise below)  Limitations and rationale for visit method d/w patient.  Patient agreed to proceed.   CC: follow up.   HPI:  Daughter on call today.  Extra history per daughter.    She is notably sleepy after talking sertraline. Family stopped med in the meantime, off for the last 2 weeks.  Med list updated.    Patient is at home, in a chair.  She has more trouble bathing, with self care.  She has more trouble with short term recall.  She'll eat and then shortly thereafter she'll forget she has eaten.  She wandered out of the house recently, found by family w/o falling.    Daughter wants to keep her at home but she doesn't know if she can.  Patient gets SOB walking to BR or kitchen.  Needs HH PT, aid.    Pt's son is going to care for patient next week, to give daughter a break.    Meds and allergies reviewed.   ROS: Per HPI unless specifically indicated in ROS section   NAD Speech wnl On O2, in recliner.    A/P: Memory loss.  Deconditioned.  More trouble with short-term recall.  Needing extra help at home.  See above.  Home health referral placed to see about increasing her exertional capacity with PT.  She needs an aide for bathing.  She did not tolerate sertraline.  She has neurology referral placed.

## 2022-06-18 NOTE — Assessment & Plan Note (Signed)
Also deconditioned.  More trouble with short-term recall.  Needing extra help at home.  See above.  Home health referral placed to see about increasing her exertional capacity with PT.  She needs an aide for bathing.  She did not tolerate sertraline.  She has neurology referral placed.

## 2022-06-20 ENCOUNTER — Ambulatory Visit: Payer: Self-pay

## 2022-06-20 NOTE — Patient Outreach (Signed)
  Care Coordination   06/20/2022 Name: Megan Peterson MRN: [id number] DOB: [birth year]   Care Coordination Outreach Attempts:  An unsuccessful telephone outreach was attempted for a scheduled appointment today.  Follow Up Plan:  Additional outreach attempts will be made to offer the patient care coordination information and services.   Encounter Outcome:  No Answer   Care Coordination Interventions:  No, not indicated    SIG Lysle Morales, BSW Social Worker [facility] Care Management  [phone]

## 2022-06-22 DIAGNOSIS — J9611 Chronic respiratory failure with hypoxia: Secondary | ICD-10-CM | POA: Diagnosis not present

## 2022-06-22 DIAGNOSIS — J432 Centrilobular emphysema: Secondary | ICD-10-CM | POA: Diagnosis not present

## 2022-06-28 ENCOUNTER — Telehealth: Payer: Self-pay | Admitting: Family Medicine

## 2022-06-28 DIAGNOSIS — I10 Essential (primary) hypertension: Secondary | ICD-10-CM | POA: Diagnosis not present

## 2022-06-28 DIAGNOSIS — M549 Dorsalgia, unspecified: Secondary | ICD-10-CM | POA: Diagnosis not present

## 2022-06-28 DIAGNOSIS — G8929 Other chronic pain: Secondary | ICD-10-CM | POA: Diagnosis not present

## 2022-06-28 DIAGNOSIS — Z9981 Dependence on supplemental oxygen: Secondary | ICD-10-CM | POA: Diagnosis not present

## 2022-06-28 DIAGNOSIS — R413 Other amnesia: Secondary | ICD-10-CM | POA: Diagnosis not present

## 2022-06-28 DIAGNOSIS — J449 Chronic obstructive pulmonary disease, unspecified: Secondary | ICD-10-CM | POA: Diagnosis not present

## 2022-06-28 NOTE — Telephone Encounter (Signed)
Home Health verbal orders Caller Name: steve  Agency Name: [facility]   Callback number: [phone]  Requesting OT/PT/Skilled nursing/Social Work/Speech: OT, PT, HH aid   Reason:  Frequency: PT - one week four        OT - Eval,         HH aid - two week four    Please forward to [facility] pool or providers CMA

## 2022-06-29 DIAGNOSIS — J9611 Chronic respiratory failure with hypoxia: Secondary | ICD-10-CM | POA: Diagnosis not present

## 2022-06-29 NOTE — Telephone Encounter (Signed)
Please give the order.  Thanks.   

## 2022-06-29 NOTE — Telephone Encounter (Signed)
Called and advised Brett Canales with [facility] of the approval of the requested verbal orders for this patient. Advised to call back with any further questions.

## 2022-06-29 NOTE — Progress Notes (Signed)
1400- Palliative Care Follow Up Encounter Note   PATIENT NAME: Megan Peterson DOB: Feb 10, 1940 MRN: [id number]  PRIMARY CARE PROVIDER: Joaquim Nam, MD  RESPONSIBLE PARTY:  Acct ID - Guarantor Home Phone Work Phone Relationship Acct Type  [id number] KIMBRELY, BUCKEL [phone]  Self P/F     [address], [place], [place] [address]    RN completed home visit. Daughter present.   HISTORY OF PRESENT ILLNESS:  82 y.o. year old female  with dementia, emphysema, chronic respiratory failure, depression, obesity,  b12 deficiency, chronic back pain, incontinence at times, anxiety, hyperlipdiemia, prior TIA  Caregiver fatigue: Daughter is very emotional. She has recently lost her grandson to an overdose. States she "has no help. Brother is useless." Wants to go on vacation this summer, but brother wont keep mom. RN talked to daughter about respite care. Daughter agreed to allow SW to get information on respite care. Daughter reports that pt's sister was in [facility] [place] for rehab and pts ex husband went to [facility] for respite. Reports that pt has good days and bad days. On her "bad" days, nothing goes right, "she cant do anything for herself. Shaking hands, can't talk to her." Will occasionally wander.   Dementia: Unable to tell RN how many children or grandchildren she has. Answers questions but not appropriately. Pt very set against going to facility. Daughter says she is lonely, pt says it is too much trouble to get ready to go somewhere.    Appetite: Does not eat meals. Will usually snack and eats a lot during the day.   Mobility: does not use walker in the house. Uses touch. "May" use it when she leaves the house per daughter, but pt doesn't leave the home much.    Sleeping Pattern: has been having problems with sleep cycles. Taking Sertraline and daughter reports that pt is more sleepy since starting that.  Pain: "have back pain" but not willing to rate on a scale, just shrugs. Daughter  says that mom complains often of discomfort   Palliative Care/ Hospice: RN discussed benefits of hospice care as well as the differences between the two with patient and daughter for when pt is ready. Voiced understanding.    CODE STATUS: DNI ADVANCED DIRECTIVES: N MOST FORM: Yes (present in Epic) PPS: weak 50% in 09/2021  Next appt scheduled: Maxine Glenn and Geraldine Contras will call to schedule follow up visit.     PHYSICAL EXAM: Pt said no when RN asked if she could do her vitals and listen to her.  VITALS:There were no vitals filed for this visit.    Barbette Merino, RN

## 2022-06-30 ENCOUNTER — Emergency Department (HOSPITAL_COMMUNITY): Payer: Medicare HMO

## 2022-06-30 ENCOUNTER — Observation Stay (HOSPITAL_COMMUNITY)
Admission: EM | Admit: 2022-06-30 | Discharge: 2022-07-02 | Disposition: A | Discharge status: Home / Self Care | Payer: Medicare HMO | Attending: Internal Medicine | Admitting: Internal Medicine

## 2022-06-30 ENCOUNTER — Other Ambulatory Visit: Payer: Self-pay

## 2022-06-30 DIAGNOSIS — F0390 Unspecified dementia without behavioral disturbance: Secondary | ICD-10-CM | POA: Diagnosis not present

## 2022-06-30 DIAGNOSIS — Z85828 Personal history of other malignant neoplasm of skin: Secondary | ICD-10-CM | POA: Diagnosis not present

## 2022-06-30 DIAGNOSIS — J441 Chronic obstructive pulmonary disease with (acute) exacerbation: Secondary | ICD-10-CM | POA: Diagnosis not present

## 2022-06-30 DIAGNOSIS — I700 Atherosclerosis of aorta: Secondary | ICD-10-CM | POA: Diagnosis not present

## 2022-06-30 DIAGNOSIS — R0789 Other chest pain: Secondary | ICD-10-CM | POA: Diagnosis not present

## 2022-06-30 DIAGNOSIS — J439 Emphysema, unspecified: Secondary | ICD-10-CM | POA: Diagnosis not present

## 2022-06-30 DIAGNOSIS — R079 Chest pain, unspecified: Secondary | ICD-10-CM | POA: Diagnosis not present

## 2022-06-30 DIAGNOSIS — R609 Edema, unspecified: Secondary | ICD-10-CM | POA: Diagnosis not present

## 2022-06-30 DIAGNOSIS — G629 Polyneuropathy, unspecified: Secondary | ICD-10-CM | POA: Diagnosis not present

## 2022-06-30 DIAGNOSIS — R54 Age-related physical debility: Secondary | ICD-10-CM | POA: Insufficient documentation

## 2022-06-30 DIAGNOSIS — J9811 Atelectasis: Secondary | ICD-10-CM | POA: Diagnosis not present

## 2022-06-30 DIAGNOSIS — R0902 Hypoxemia: Secondary | ICD-10-CM | POA: Diagnosis not present

## 2022-06-30 DIAGNOSIS — Z79899 Other long term (current) drug therapy: Secondary | ICD-10-CM | POA: Insufficient documentation

## 2022-06-30 DIAGNOSIS — Z87891 Personal history of nicotine dependence: Secondary | ICD-10-CM | POA: Insufficient documentation

## 2022-06-30 LAB — BASIC METABOLIC PANEL
Anion gap: 15 (ref 5–15)
BUN: 19 mg/dL (ref 8–23)
CO2: 28 mmol/L (ref 22–32)
Calcium: 9.1 mg/dL (ref 8.9–10.3)
Chloride: 97 mmol/L — ABNORMAL LOW (ref 98–111)
Creatinine, Ser: 1.4 mg/dL — ABNORMAL HIGH (ref 0.44–1.00)
GFR, Estimated: 38 mL/min — ABNORMAL LOW (ref >60)
Glucose, Bld: 103 mg/dL — ABNORMAL HIGH (ref 70–99)
Potassium: 4.6 mmol/L (ref 3.5–5.1)
Sodium: 140 mmol/L (ref 135–145)

## 2022-06-30 LAB — CBC
HCT: 37.6 % (ref 36.0–46.0)
Hemoglobin: 12.4 g/dL (ref 12.0–15.0)
MCH: 32 pg (ref 26.0–34.0)
MCHC: 33 g/dL (ref 30.0–36.0)
MCV: 96.9 fL (ref 80.0–100.0)
Platelets: 210 10*3/uL (ref 150–400)
RBC: 3.88 MIL/uL (ref 3.87–5.11)
RDW: 12.7 % (ref 11.5–15.5)
WBC: 10.8 10*3/uL — ABNORMAL HIGH (ref 4.0–10.5)
nRBC: 0 % (ref 0.0–0.2)

## 2022-06-30 LAB — TROPONIN I (HIGH SENSITIVITY)
Troponin I (High Sensitivity): 4 ng/L (ref <18)
Troponin I (High Sensitivity): 4 ng/L (ref <18)

## 2022-06-30 NOTE — ED Triage Notes (Signed)
Pt to the ed from home with a CC of chest pain x 1 day. Pt has dementia and lives at home with daughter. Pt was clutching chest and was sob. Pt has COPD and wear 02 at home continuously.   Pt has no complaints at this time. Pt daughter gave Asprin and nitroglycerin prior to calling EMS.

## 2022-06-30 NOTE — ED Provider Notes (Signed)
[place] EMERGENCY DEPARTMENT AT [facility] Provider Note   CSN: [id number] Arrival date & time: 06/30/22  1845     History  Chief Complaint  Patient presents with   Chest Pain    Megan Peterson is a 82 y.o. female.  Pt is a 82 yo female with pmhx significant for COPD, chronic back pain, skin cancer, anxiety, and dementia.  Pt's daughter gives most of the hx due to pt's dementia.  Daughter said pt c/o cp several times today.  Pt denies any pain now.       Home Medications Prior to Admission medications   Medication Sig Start Date End Date Taking? Authorizing Provider  albuterol (ACCUNEB) 0.63 MG/3ML nebulizer solution Take 3 mLs (0.63 mg total) by nebulization every 6 (six) hours as needed for wheezing. 06/22/21   Oretha Milch, MD  albuterol (VENTOLIN HFA) 108 (90 Base) MCG/ACT inhaler INHALE 1 TO 2 PUFFS BY MOUTH EVERY 6 HOURS AS NEEDED FOR WHEEZING FOR SHORTNESS OF BREATH 05/03/22   Joaquim Nam, MD  furosemide (LASIX) 20 MG tablet TAKE 1 TABLET BY MOUTH ONCE DAILY AS NEEDED IN THE MORNING FOR FLUID 04/04/22   Joaquim Nam, MD  gabapentin (NEURONTIN) 800 MG tablet TAKE 1 TABLET BY MOUTH THREE TIMES DAILY 04/04/22   Joaquim Nam, MD  HYDROcodone-acetaminophen (NORCO/VICODIN) 5-325 MG tablet Take 1 tablet by mouth every 6 (six) hours as needed for moderate pain. 05/10/22   Joaquim Nam, MD  memantine [facility]) 10 MG tablet Take 1 tablet by mouth twice daily 04/04/22   Drema Dallas, DO  metoprolol succinate (TOPROL-XL) 25 MG 24 hr tablet Take 1 tablet (25 mg total) by mouth daily. 12/09/21   Joaquim Nam, MD  nitroGLYCERIN (NITROSTAT) 0.4 MG SL tablet Place 0.4 mg under the tongue every 5 (five) minutes as needed for chest pain (max 3 doses in 15 minutes.  if still having chest pain then go to the ER). 06/13/21   Reed, Tiffany L, DO  nystatin (MYCOSTATIN/NYSTOP) powder Apply 1 Application topically 3 (three) times daily. 05/10/22   Joaquim Nam, MD   ondansetron (ZOFRAN) 4 MG tablet Take 4 mg by mouth every 8 (eight) hours as needed for nausea or vomiting.    [provider]  OXYGEN Inhale 2 L into the lungs at bedtime.    [provider]  predniSONE (DELTASONE) 20 MG tablet 2 tabs po daily for 5 days, then 1 tab po daily for 5 days 05/10/22   Copland, Karleen Hampshire, MD  triamcinolone cream (KENALOG) 0.5 % APPLY  CREAM TOPICALLY TO AFFECTED AREA TWICE DAILY FOR  RASH  ON  LEGS 09/13/21   Joaquim Nam, MD  umeclidinium-vilanterol [facility] ELLIPTA) 62.5-25 MCG/ACT AEPB Inhale 1 puff by mouth once daily 02/11/22   Joaquim Nam, MD  vitamin B-12 (CYANOCOBALAMIN) 1000 MCG tablet Take 1 tablet (1,000 mcg total) by mouth daily. 06/29/21   Joaquim Nam, MD      Allergies    Buspar [buspirone], Cymbalta [duloxetine hcl], Lexapro [escitalopram oxalate], Meperidine hcl, Mirtazapine, Morphine and codeine, Sertraline, and Statins    Review of Systems   Review of Systems  Cardiovascular:  Positive for chest pain.  All other systems reviewed and are negative.   Physical Exam Updated Vital Signs BP 125/69 (BP Location: Left Arm)   Pulse 88   Temp 98.4 F (36.9 C) (Oral)   Resp 17   Ht 4\' 10"  (1.473 m)  Wt 63 kg   SpO2 95%   BMI 29.03 kg/m  Physical Exam Vitals and nursing note reviewed.  Constitutional:      Appearance: She is well-developed.  HENT:     Head: Normocephalic and atraumatic.  Eyes:     Extraocular Movements: Extraocular movements intact.     Pupils: Pupils are equal, round, and reactive to light.  Cardiovascular:     Rate and Rhythm: Normal rate and regular rhythm.     Heart sounds: Normal heart sounds.  Pulmonary:     Effort: Pulmonary effort is normal.     Breath sounds: Normal breath sounds.  Abdominal:     General: Bowel sounds are normal.     Palpations: Abdomen is soft.  Musculoskeletal:        General: Normal range of motion.     Cervical back: Normal range of motion and neck supple.   Skin:    General: Skin is warm.     Capillary Refill: Capillary refill takes less than 2 seconds.  Neurological:     General: No focal deficit present.     Mental Status: She is alert. Mental status is at baseline.  Psychiatric:        Mood and Affect: Mood normal.        Behavior: Behavior normal.     ED Results / Procedures / Treatments   Labs (all labs ordered are listed, but only abnormal results are displayed) Labs Reviewed  BASIC METABOLIC PANEL - Abnormal; Notable for the following components:      Result Value   Chloride 97 (*)    Glucose, Bld 103 (*)    Creatinine, Ser 1.40 (*)    GFR, Estimated 38 (*)    All other components within normal limits  CBC - Abnormal; Notable for the following components:   WBC 10.8 (*)    All other components within normal limits  TROPONIN I (HIGH SENSITIVITY)  TROPONIN I (HIGH SENSITIVITY)    EKG EKG Interpretation Date/Time:  Friday June 30 2022 19:13:10 EDT Ventricular Rate:  93 PR Interval:  139 QRS Duration:  98 QT Interval:  354 QTC Calculation: 441 R Axis:   33  Text Interpretation: Sinus rhythm Low voltage, precordial leads Minimal ST elevation, inferior leads No significant change since last tracing Confirmed by Jacalyn Lefevre [phone]) on 06/30/2022 7:19:19 PM  Radiology DG Chest Port 1 View  Result Date: 06/30/2022 CLINICAL DATA:  cp EXAM: PORTABLE CHEST 1 VIEW COMPARISON:  Chest x-ray 09/07/2020, CT angio chest 04/21/2016 FINDINGS: The heart and mediastinal contours are unchanged. Aortic calcification. No focal consolidation. Chronic coarsened interstitial markings with no overt pulmonary edema. No pleural effusion. No pneumothorax. No acute osseous abnormality. IMPRESSION: 1. No active disease. 2.  Aortic Atherosclerosis (ICD10-I70.0). Electronically Signed   By: Tish Frederickson M.D.   On: 06/30/2022 19:30    Procedures Procedures    Medications Ordered in ED Medications - No data to display  ED Course/ Medical  Decision Making/ A&P                             Medical Decision Making Amount and/or Complexity of Data Reviewed Labs: ordered. Radiology: ordered.   This patient presents to the ED for concern of cp, this involves an extensive number of treatment options, and is a complaint that carries with it a high risk of complications and morbidity.  The differential diagnosis includes cardiac, pulm, gi  Co morbidities that complicate the patient evaluation  COPD, chronic back pain, skin cancer, anxiety, and dementia   Additional history obtained:  Additional history obtained from epic chart review External records from outside source obtained and reviewed including EMS report/daughter   Lab Tests:  I Ordered, and personally interpreted labs.  The pertinent results include:  cbc nl, bmp nl, trop nl   Imaging Studies ordered:  I ordered imaging studies including cxr and ct chest I independently visualized and interpreted imaging which showed  . No active disease.  2.  Aortic Atherosclerosis (ICD10-I70.0).   I agree with the radiologist interpretation   Cardiac Monitoring:  The patient was maintained on a cardiac monitor.  I personally viewed and interpreted the cardiac monitored which showed an underlying rhythm of: nsr   Medicines ordered and prescription drug management:   I have reviewed the patients home medicines and have made adjustments as needed   Test Considered:  ct  Problem List / ED Course:  Cp:  atypical.  2 trop nl   Reevaluation:  After the interventions noted above, I reevaluated the patient and found that they have :improved   Social Determinants of Health:  Lives at home   Dispostion:  After consideration of the diagnostic results and the patients response to treatment, I feel that the patent would benefit from pending at shift change..          Final Clinical Impression(s) / ED Diagnoses Final diagnoses:  Atypical chest pain     Rx / DC Orders ED Discharge Orders     None         Jacalyn Lefevre, MD 06/30/22 2326

## 2022-07-01 ENCOUNTER — Observation Stay (HOSPITAL_BASED_OUTPATIENT_CLINIC_OR_DEPARTMENT_OTHER): Payer: Medicare HMO

## 2022-07-01 ENCOUNTER — Emergency Department (HOSPITAL_COMMUNITY): Payer: Medicare HMO

## 2022-07-01 ENCOUNTER — Other Ambulatory Visit (HOSPITAL_COMMUNITY): Payer: Medicare HMO

## 2022-07-01 DIAGNOSIS — R0789 Other chest pain: Secondary | ICD-10-CM

## 2022-07-01 DIAGNOSIS — J9811 Atelectasis: Secondary | ICD-10-CM | POA: Diagnosis not present

## 2022-07-01 DIAGNOSIS — I700 Atherosclerosis of aorta: Secondary | ICD-10-CM | POA: Diagnosis not present

## 2022-07-01 DIAGNOSIS — R079 Chest pain, unspecified: Secondary | ICD-10-CM | POA: Diagnosis not present

## 2022-07-01 DIAGNOSIS — J439 Emphysema, unspecified: Secondary | ICD-10-CM | POA: Diagnosis not present

## 2022-07-01 HISTORY — DX: Other chest pain: R07.89

## 2022-07-01 LAB — LIPID PANEL
Cholesterol: 221 mg/dL — ABNORMAL HIGH (ref 0–200)
HDL: 43 mg/dL (ref >40)
LDL Cholesterol: 165 mg/dL — ABNORMAL HIGH (ref 0–99)
Total CHOL/HDL Ratio: 5.1 RATIO
Triglycerides: 64 mg/dL (ref <150)
VLDL: 13 mg/dL (ref 0–40)

## 2022-07-01 LAB — CBC
HCT: 36 % (ref 36.0–46.0)
Hemoglobin: 11.5 g/dL — ABNORMAL LOW (ref 12.0–15.0)
MCH: 30.6 pg (ref 26.0–34.0)
MCHC: 31.9 g/dL (ref 30.0–36.0)
MCV: 95.7 fL (ref 80.0–100.0)
Platelets: 193 10*3/uL (ref 150–400)
RBC: 3.76 MIL/uL — ABNORMAL LOW (ref 3.87–5.11)
RDW: 12.5 % (ref 11.5–15.5)
WBC: 9.9 10*3/uL (ref 4.0–10.5)
nRBC: 0 % (ref 0.0–0.2)

## 2022-07-01 LAB — ECHOCARDIOGRAM COMPLETE: Weight: 2222.24 oz

## 2022-07-01 LAB — CREATININE, SERUM
Creatinine, Ser: 1.28 mg/dL — ABNORMAL HIGH (ref 0.44–1.00)
GFR, Estimated: 42 mL/min — ABNORMAL LOW (ref >60)

## 2022-07-01 LAB — BRAIN NATRIURETIC PEPTIDE: B Natriuretic Peptide: 35.7 pg/mL (ref 0.0–100.0)

## 2022-07-01 MED ORDER — HYDROCODONE-ACETAMINOPHEN 5-325 MG PO TABS
1.0000 | ORAL_TABLET | Freq: Four times a day (QID) | ORAL | Status: DC | PRN
  Administered 2022-07-01 – 2022-07-02 (×3): 1 via ORAL
  Filled 2022-07-01 (×3): qty 1

## 2022-07-01 MED ORDER — SODIUM CHLORIDE 0.9% FLUSH
3.0000 mL | Freq: Two times a day (BID) | INTRAVENOUS | Status: DC
  Administered 2022-07-01 – 2022-07-02 (×2): 3 mL via INTRAVENOUS

## 2022-07-01 MED ORDER — IOHEXOL 350 MG/ML SOLN
50.0000 mL | Freq: Once | INTRAVENOUS | Status: AC | PRN
  Administered 2022-07-01: 50 mL via INTRAVENOUS

## 2022-07-01 MED ORDER — SENNOSIDES-DOCUSATE SODIUM 8.6-50 MG PO TABS
1.0000 | ORAL_TABLET | Freq: Every day | ORAL | Status: DC
  Administered 2022-07-01: 1 via ORAL
  Filled 2022-07-01 (×2): qty 1

## 2022-07-01 MED ORDER — UMECLIDINIUM-VILANTEROL 62.5-25 MCG/ACT IN AEPB
1.0000 | INHALATION_SPRAY | Freq: Every day | RESPIRATORY_TRACT | Status: DC
  Administered 2022-07-01 – 2022-07-02 (×2): 1 via RESPIRATORY_TRACT
  Filled 2022-07-01: qty 14

## 2022-07-01 MED ORDER — ACETAMINOPHEN 325 MG PO TABS
650.0000 mg | ORAL_TABLET | Freq: Four times a day (QID) | ORAL | Status: DC | PRN
  Administered 2022-07-01 (×2): 650 mg via ORAL
  Filled 2022-07-01 (×3): qty 2

## 2022-07-01 MED ORDER — METHYLPREDNISOLONE SODIUM SUCC 40 MG IJ SOLR
40.0000 mg | Freq: Every day | INTRAMUSCULAR | Status: DC
  Administered 2022-07-01 – 2022-07-02 (×2): 40 mg via INTRAVENOUS
  Filled 2022-07-01 (×2): qty 1

## 2022-07-01 MED ORDER — NITROGLYCERIN 0.4 MG SL SUBL: 0.4000 mg | SUBLINGUAL_TABLET | SUBLINGUAL | Status: DC | PRN

## 2022-07-01 MED ORDER — AZITHROMYCIN 250 MG PO TABS
500.0000 mg | ORAL_TABLET | Freq: Every day | ORAL | Status: AC
  Administered 2022-07-01: 500 mg via ORAL
  Filled 2022-07-01: qty 2

## 2022-07-01 MED ORDER — ARTIFICIAL TEARS OPHTHALMIC OINT: TOPICAL_OINTMENT | OPHTHALMIC | Status: DC | PRN

## 2022-07-01 MED ORDER — POLYETHYLENE GLYCOL 3350 17 G PO PACK
17.0000 g | PACK | Freq: Every day | ORAL | Status: DC | PRN
  Administered 2022-07-01 – 2022-07-02 (×2): 17 g via ORAL
  Filled 2022-07-01 (×2): qty 1

## 2022-07-01 MED ORDER — VITAMIN B-12 1000 MCG PO TABS
1000.0000 ug | ORAL_TABLET | Freq: Every day | ORAL | Status: DC
  Administered 2022-07-01 – 2022-07-02 (×2): 1000 ug via ORAL
  Filled 2022-07-01 (×2): qty 1

## 2022-07-01 MED ORDER — SODIUM CHLORIDE 0.9% FLUSH
3.0000 mL | INTRAVENOUS | Status: DC | PRN
  Administered 2022-07-01: 3 mL via INTRAVENOUS

## 2022-07-01 MED ORDER — GABAPENTIN 400 MG PO CAPS
400.0000 mg | ORAL_CAPSULE | Freq: Three times a day (TID) | ORAL | Status: DC
  Administered 2022-07-01 – 2022-07-02 (×4): 400 mg via ORAL
  Filled 2022-07-01 (×4): qty 1

## 2022-07-01 MED ORDER — HYDROMORPHONE HCL 1 MG/ML IJ SOLN: 0.5000 mg | INTRAMUSCULAR | Status: DC | PRN

## 2022-07-01 MED ORDER — ENOXAPARIN SODIUM 30 MG/0.3ML IJ SOSY
30.0000 mg | PREFILLED_SYRINGE | INTRAMUSCULAR | Status: DC
  Administered 2022-07-02: 30 mg via SUBCUTANEOUS
  Filled 2022-07-01: qty 0.3

## 2022-07-01 MED ORDER — GUAIFENESIN 100 MG/5ML PO LIQD: 5.0000 mL | ORAL | Status: DC | PRN

## 2022-07-01 MED ORDER — MEMANTINE HCL 10 MG PO TABS
10.0000 mg | ORAL_TABLET | Freq: Two times a day (BID) | ORAL | Status: DC
  Administered 2022-07-01 – 2022-07-02 (×3): 10 mg via ORAL
  Filled 2022-07-01 (×3): qty 1

## 2022-07-01 MED ORDER — ENOXAPARIN SODIUM 40 MG/0.4ML IJ SOSY
40.0000 mg | PREFILLED_SYRINGE | INTRAMUSCULAR | Status: DC
  Administered 2022-07-01: 40 mg via SUBCUTANEOUS
  Filled 2022-07-01: qty 0.4

## 2022-07-01 MED ORDER — AZITHROMYCIN 250 MG PO TABS
250.0000 mg | ORAL_TABLET | Freq: Every day | ORAL | Status: DC
  Administered 2022-07-02: 250 mg via ORAL
  Filled 2022-07-01: qty 1

## 2022-07-01 MED ORDER — SODIUM CHLORIDE 0.9 % IV SOLN: 250.0000 mL | INTRAVENOUS | Status: DC | PRN

## 2022-07-01 MED ORDER — ALBUTEROL SULFATE (2.5 MG/3ML) 0.083% IN NEBU
2.5000 mg | INHALATION_SOLUTION | Freq: Four times a day (QID) | RESPIRATORY_TRACT | Status: DC | PRN
  Administered 2022-07-01: 2.5 mg via RESPIRATORY_TRACT
  Filled 2022-07-01: qty 3

## 2022-07-01 MED ORDER — IPRATROPIUM-ALBUTEROL 0.5-2.5 (3) MG/3ML IN SOLN
3.0000 mL | Freq: Once | RESPIRATORY_TRACT | Status: AC
  Administered 2022-07-01: 3 mL via RESPIRATORY_TRACT
  Filled 2022-07-01: qty 3

## 2022-07-01 MED ORDER — IPRATROPIUM-ALBUTEROL 0.5-2.5 (3) MG/3ML IN SOLN
3.0000 mL | Freq: Four times a day (QID) | RESPIRATORY_TRACT | Status: DC
  Administered 2022-07-01 (×3): 3 mL via RESPIRATORY_TRACT
  Filled 2022-07-01 (×3): qty 3

## 2022-07-01 MED ORDER — NYSTATIN 100000 UNIT/GM EX POWD
1.0000 | Freq: Three times a day (TID) | CUTANEOUS | Status: DC
  Administered 2022-07-01 – 2022-07-02 (×3): 1 via TOPICAL
  Filled 2022-07-01: qty 15

## 2022-07-01 NOTE — Progress Notes (Signed)
PT is recommending a transport chair. Contacted Jasmine with [facility]. She reports that the transport chair is usually covered by the insurance. Notified Dr. Dartha Lodge and asked for a DME order. Will contact [facility] for DME referral once pt has the DME order.

## 2022-07-01 NOTE — Evaluation (Signed)
Occupational Therapy Evaluation Patient Details Name: Megan Peterson MRN: [id number] DOB: [birth year] Today's Date: 07/01/2022   History of Present Illness Megan Peterson is a 82 y.o. female who presents with chest pain. High-sensitivity troponin negative x 2. PMHx: COPD, chronic back pain, skin cancer, anxiety, and dementia.   Clinical Impression   Pt evaluated s/p above admission list. Per daughter, pt requires assist for ADL/IADLs and is a household ambulator with occasional assist and no AD at baseline.  Pt presents this session with generalized weakness and SOB with activity on 2L [place] with VSS. Pt currently requires supervision A for seated UB ADLs and mod A for LB ADLs. Pt completed functional transfers and room/hallway mobility with min guard-supervision A without use of AD. Per daughter, pt is currently at her functional baseline. Pt referred to mobility team for further mobility. Pt does not require further acute OT needs or follow up OT needs at discharge as she is at her functional baseline.      Recommendations for follow up therapy are one component of a multi-disciplinary discharge planning process, led by the attending physician.  Recommendations may be updated based on patient status, additional functional criteria and insurance authorization.   Assistance Recommended at Discharge Frequent or constant Supervision/Assistance  Patient can return home with the following A little help with walking and/or transfers;A little help with bathing/dressing/bathroom;Assistance with cooking/housework;Direct supervision/assist for medications management;Direct supervision/assist for financial management;Assist for transportation;Help with stairs or ramp for entrance    Functional Status Assessment  Patient has had a recent decline in their functional status and demonstrates the ability to make significant improvements in function in a reasonable and predictable amount of time.  Equipment  Recommendations  None recommended by OT    Recommendations for Other Services       Precautions / Restrictions Precautions Precautions: Fall Restrictions Weight Bearing Restrictions: No      Mobility Bed Mobility               General bed mobility comments: Not assessed, pt on BSC upon arrival    Transfers Overall transfer level: Needs assistance Equipment used: None Transfers: Sit to/from Stand Sit to Stand: Min guard, Supervision           General transfer comment: STS transfers and room/hallway level mobility with min guard to supervision; daughter reports this is functional baseline      Balance Overall balance assessment: Needs assistance Sitting-balance support: No upper extremity supported, Feet supported Sitting balance-Leahy Scale: Good     Standing balance support: No upper extremity supported, During functional activity Standing balance-Leahy Scale: Fair                             ADL either performed or assessed with clinical judgement   ADL Overall ADL's : Needs assistance/impaired Eating/Feeding: Modified independent;Sitting   Grooming: Supervision/safety;Sitting   Upper Body Bathing: Sitting;Supervision/ safety   Lower Body Bathing: Sit to/from stand;Moderate assistance   Upper Body Dressing : Sitting;Supervision/safety   Lower Body Dressing: Sit to/from stand;Moderate assistance   Toilet Transfer: Teacher, early years/pre Details (indicate cue type and reason): simulated Toileting- Clothing Manipulation and Hygiene: Total assistance;Sit to/from stand Toileting - Clothing Manipulation Details (indicate cue type and reason): total A for posterior hygiene in standing     Functional mobility during ADLs: Min guard General ADL Comments: Limited secondary to generalized weakness, SOB. Daughter reports pt is at functional baseline.  Vision Baseline Vision/History: 0 No visual deficits Ability to See in  Adequate Light: 0 Adequate Vision Assessment?: No apparent visual deficits     Perception Perception Perception Tested?: No   Praxis Praxis Praxis tested?: Not tested    Pertinent Vitals/Pain Pain Assessment Pain Assessment: Faces Faces Pain Scale: No hurt Pain Intervention(s): Monitored during session     Hand Dominance Right   Extremity/Trunk Assessment Upper Extremity Assessment Upper Extremity Assessment: Generalized weakness   Lower Extremity Assessment Lower Extremity Assessment: Defer to PT evaluation   Cervical / Trunk Assessment Cervical / Trunk Assessment: Kyphotic   Communication Communication Communication: No difficulties   Cognition Arousal/Alertness: Awake/alert Behavior During Therapy: WFL for tasks assessed/performed Overall Cognitive Status: History of cognitive impairments - at baseline                                 General Comments: Pleasant and cooperative, follows simple 1-step commands with increased time; daughter available to provide PLOF and home setup     General Comments  VSS on 2L [place]; daughter present throughout    Exercises     Shoulder Instructions      Home Living Family/patient expects to be discharged to:: Private residence Living Arrangements: Children (daughter) Available Help at Discharge: Family;Available 24 hours/day Type of Home: House Home Access: Stairs to enter [employer] of Steps: 1 Entrance Stairs-Rails: None Home Layout: One level     Bathroom Shower/Tub: Chief Strategy Officer: Standard     Home Equipment: Teacher, English as a foreign language (2 wheels)   Additional Comments: On 2L [place] at baseline      Prior Functioning/Environment Prior Level of Function : Needs assist  Cognitive Assist : Mobility (cognitive);ADLs (cognitive) Mobility (Cognitive): Intermittent cues ADLs (Cognitive): Intermittent cues Physical Assist : Mobility (physical);ADLs (physical) Mobility  (physical): Bed mobility;Transfers;Gait ADLs (physical): Grooming;Bathing;Dressing;Toileting;IADLs Mobility Comments: household ambulator without AD with supervision A at baseline ADLs Comments: Pt requires assist for all IADLs, requires fluctuating levels of assist for ADLs, dementia at baseline        OT Problem List: Decreased activity tolerance;Decreased cognition;Decreased safety awareness;Cardiopulmonary status limiting activity;Decreased strength      OT Treatment/Interventions:      OT Goals(Current goals can be found in the care plan section) Acute Rehab OT Goals Patient Stated Goal: to rest OT Goal Formulation: With patient Time For Goal Achievement: 07/15/22 Potential to Achieve Goals: Good  OT Frequency:      Co-evaluation              AM-PAC OT "6 Clicks" Daily Activity     Outcome Measure Help from another person eating meals?: None Help from another person taking care of personal grooming?: A Little Help from another person toileting, which includes using toliet, bedpan, or urinal?: A Little Help from another person bathing (including washing, rinsing, drying)?: A Little Help from another person to put on and taking off regular upper body clothing?: A Little Help from another person to put on and taking off regular lower body clothing?: A Lot 6 Click Score: 18   End of Session Equipment Utilized During Treatment: Oxygen Nurse Communication: Mobility status  Activity Tolerance: Patient tolerated treatment well Patient left: in chair;with call bell/phone within reach;with chair alarm set;with family/visitor present  OT Visit Diagnosis: Other symptoms and signs involving cognitive function;Muscle weakness (generalized) (M62.81)  Time: 1040-1056 OT Time Calculation (min): 16 min Charges:  OT General Charges $OT Visit: 1 Visit OT Evaluation $OT Eval Moderate Complexity: 1 Mod  Sherley Bounds, OTS Acute Rehabilitation Services Office  [phone] Secure Chat Communication Preferred   Sherley Bounds 07/01/2022, 12:21 PM
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
## 2022-07-01 NOTE — Progress Notes (Signed)
  Echocardiogram 2D Echocardiogram has been performed.  Delcie Roch 07/01/2022, 5:36 PM
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
## 2022-07-01 NOTE — Evaluation (Signed)
Physical Therapy Evaluation Patient Details Name: Megan Peterson MRN: [id number] DOB: [birth year] Today's Date: 07/01/2022  History of Present Illness  Megan Peterson is a 81 y.o. female who presents with chest pain. High-sensitivity troponin negative x 2. PMHx: COPD, chronic back pain, skin cancer, anxiety, and dementia.  Clinical Impression  Pt presents to PT close to baseline with mobility. At home she only amb household distances and intermittent assist. Needs transport chair for any activity outside the home. Daughter confirms she is close to baseline. Recommend transport chair for home. Will defer further in hospital mobility to mobility team.      Recommendations for follow up therapy are one component of a multi-disciplinary discharge planning process, led by the attending physician.  Recommendations may be updated based on patient status, additional functional criteria and insurance authorization.  Follow Up Recommendations       Assistance Recommended at Discharge Frequent or constant Supervision/Assistance  Patient can return home with the following  A little help with walking and/or transfers;Help with stairs or ramp for entrance;Assistance with cooking/housework;Direct supervision/assist for medications management;A little help with bathing/dressing/bathroom    Equipment Recommendations Other (comment) (transport chair)  Recommendations for Other Services       Functional Status Assessment Patient has not had a recent decline in their functional status     Precautions / Restrictions Precautions Precautions: Fall      Mobility  Bed Mobility               General bed mobility comments: Pt up in chair    Transfers Overall transfer level: Needs assistance Equipment used: None Transfers: Sit to/from Stand, Bed to chair/wheelchair/BSC Sit to Stand: Min guard, Supervision   Step pivot transfers: Supervision       General transfer comment: Assist for safety     Ambulation/Gait Ambulation/Gait assistance: Supervision, Min guard Gait Distance (Feet): 40 Feet Assistive device: None Gait Pattern/deviations: Step-through pattern, Decreased stride length, Decreased step length - right, Decreased step length - left Gait velocity: decr Gait velocity interpretation: <1.31 ft/sec, indicative of household ambulator   General Gait Details: Assist for Wellsite geologist    Modified Rankin (Stroke Patients Only)       Balance Overall balance assessment: Needs assistance Sitting-balance support: No upper extremity supported, Feet supported Sitting balance-Leahy Scale: Good     Standing balance support: No upper extremity supported, During functional activity Standing balance-Leahy Scale: Fair                               Pertinent Vitals/Pain Pain Assessment Pain Assessment: No/denies pain    Home Living Family/patient expects to be discharged to:: Private residence Living Arrangements: Children Available Help at Discharge: Family;Available 24 hours/day Type of Home: House Home Access: Stairs to enter Entrance Stairs-Rails: None Entrance Stairs-Number of Steps: 1   Home Layout: One level Home Equipment: BSC/3in1;Rolling Walker (2 wheels) Additional Comments: O2    Prior Function Prior Level of Function : Needs assist  Cognitive Assist : Mobility (cognitive);ADLs (cognitive) Mobility (Cognitive): Intermittent cues ADLs (Cognitive): Intermittent cues Physical Assist : Mobility (physical);ADLs (physical) Mobility (physical): Bed mobility;Transfers;Gait ADLs (physical): Grooming;Bathing;Dressing;Toileting;IADLs Mobility Comments: Intermittent depending on the day. Only amb household distances (40-50') ADLs Comments: Intermittent depending on the day     Hand Dominance   Dominant Hand: Right    Extremity/Trunk Assessment  Upper Extremity Assessment Upper Extremity Assessment:  Defer to OT evaluation    Lower Extremity Assessment Lower Extremity Assessment: Generalized weakness       Communication   Communication: No difficulties  Cognition Arousal/Alertness: Awake/alert Behavior During Therapy: WFL for tasks assessed/performed Overall Cognitive Status: History of cognitive impairments - at baseline                                          General Comments General comments (skin integrity, edema, etc.): VSS on 2L. Dyspnea 2-3/4. SpO2 >91% on 2L    Exercises     Assessment/Plan    PT Assessment Patient does not need any further PT services  PT Problem List         PT Treatment Interventions      PT Goals (Current goals can be found in the Care Plan section)  Acute Rehab PT Goals PT Goal Formulation: All assessment and education complete, DC therapy    Frequency       Co-evaluation               AM-PAC PT "6 Clicks" Mobility  Outcome Measure Help needed turning from your back to your side while in a flat bed without using bedrails?: A Little Help needed moving from lying on your back to sitting on the side of a flat bed without using bedrails?: A Little Help needed moving to and from a bed to a chair (including a wheelchair)?: A Little Help needed standing up from a chair using your arms (e.g., wheelchair or bedside chair)?: A Little Help needed to walk in hospital room?: A Little Help needed climbing 3-5 steps with a railing? : A Little 6 Click Score: 18    End of Session Equipment Utilized During Treatment: Oxygen Activity Tolerance: Patient tolerated treatment well Patient left: in chair;with call bell/phone within reach;with chair alarm set;with family/visitor present Nurse Communication: Mobility status;Other (comment) (Decr O2) PT Visit Diagnosis: Other abnormalities of gait and mobility (R26.89)    Time: 1028-1040 PT Time Calculation (min) (ACUTE ONLY): 12 min   Charges:   PT Evaluation $PT Eval Low  Complexity: 1 Low          [facility] PT Acute Rehabilitation Services Office [phone]   [facility] 07/01/2022, 11:07 AM
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
## 2022-07-01 NOTE — Progress Notes (Signed)
Pt is experiencing anxiousness / restlessness, vitals stable tachy with heart Rate 114. Experiencing headache and  Rates pain at 7. Gave Hydrocodone. Daughter and son at the bedside states that he takes Vicodin with Gabapentin at the same time 3 times a day.

## 2022-07-01 NOTE — Progress Notes (Signed)
PROGRESS NOTE    Megan Peterson  ZHY:[id number] DOB: 02-May-1940 DOA: 06/30/2022 PCP: Joaquim Nam, MD  Outpatient Specialists:     Brief Narrative:  Patient is an 82 year old female with past medical history significant for moderate to advanced dementia, chronic low back pain on chronic opiates and COPD.  Patient is on oxygen 2 L/min via nasal cannula at home.  Patient is a reformed cigarette smoker.  Patient has history of hyperlipidemia, but will not agree to take statins.  Patient presents with intermittent left-sided chest pain, pressure-like, with reported pain involving the neck and dyspnea on exertion.  New onset bilateral lower extremity edema reported.  Echocardiogram done in March 2023 revealed mildly elevated pulmonary pressures.  Patient denied history of diabetes mellitus.  Troponins negative.  EKG reveals low voltage EKG.  Currently, patient is chest pain-free.  Discussed with the cardiology team.   Assessment & Plan:   Principal Problem:   Atypical chest pain  Chest pain, rule out ACS/unstable angina: -Negative troponins. -EKG reveals low voltage EKG. -Patient is currently chest pain-free. -Echocardiogram is pending. -Discussed with cardiology team. -Further management will depend on above. -Check BNP.      COPD with exacerbation: -On IV Solu-Medrol, neb treatment. -Azithromycin. -Seems stable.     Chronic lower back pain Polyneuropathy  -On chronic opiates and high doses of gabapentin -Minimize mind altering medications. -Patient is currently on gabapentin 400 Mg p.o. 3 times daily, hydrocodone/acetaminophen 5/325 1 tab p.o. every 6 hourly as needed, IV Dilaudid 0.5 Mg every 4 hours as needed. -Will continue to adjust medication.   Physical debility PT OT assessment Fall precautions.   Dementia Resume home [place] Reorient as needed   Chronic hypoxic respiratory failure On 2 L nasal cannula at her baseline Continue to maintain her O2 saturation above  92%.    DVT prophylaxis: Subcutaneous Lovenox. Code Status: Full code. Family Communication: Daughter. Disposition Plan: This will depend on hospital course.   Consultants:  Discussed case with cardiology team.  Procedures:  None.  Antimicrobials:  Azithromycin.  Patient has history of COPD.   Subjective: Denies chest pain currently.  Objective: Vitals:   07/01/22 0400 07/01/22 0749 07/01/22 0820 07/01/22 1220  BP: 121/67  (!) 149/78 118/65  Pulse: 90 95 90 96  Resp: 16 18 18 20   Temp: 97.8 F (36.6 C)  97.8 F (36.6 C) 97.8 F (36.6 C)  TempSrc: Oral  Oral Oral  SpO2: 100% 99% 96% 96%  Weight:      Height:        Intake/Output Summary (Last 24 hours) at 07/01/2022 1339 Last data filed at 07/01/2022 1144 Gross per 24 hour  Intake 3 ml  Output 100 ml  Net -97 ml   Filed Weights   06/30/22 1855  Weight: 63 kg    Examination:  General exam: Appears calm and comfortable  Respiratory system: Clear to auscultation.  Cardiovascular system: S1 & S2 heard Gastrointestinal system: Abdomen is obese, soft and nontender.  Central nervous system: Awake and alert.  Patient moves all extremities.   Extremities: Bilateral lower extremity edema.  Data Reviewed: I have personally reviewed following labs and imaging studies  CBC: Recent Labs  Lab 06/30/22 1858 07/01/22 0313  WBC 10.8* 9.9  HGB 12.4 11.5*  HCT 37.6 36.0  MCV 96.9 95.7  PLT 210 193   Basic Metabolic Panel: Recent Labs  Lab 06/30/22 1858 07/01/22 0313  NA 140  --   K 4.6  --  CL 97*  --   CO2 28  --   GLUCOSE 103*  --   BUN 19  --   CREATININE 1.40* 1.28*  CALCIUM 9.1  --    GFR: Estimated Creatinine Clearance: 27 mL/min (A) (by C-G formula based on SCr of 1.28 mg/dL (H)). Liver Function Tests: No results for input(s): "AST", "ALT", "ALKPHOS", "BILITOT", "PROT", "ALBUMIN" in the last 168 hours. No results for input(s): "LIPASE", "AMYLASE" in the last 168 hours. No results for  input(s): "AMMONIA" in the last 168 hours. Coagulation Profile: No results for input(s): "INR", "PROTIME" in the last 168 hours. Cardiac Enzymes: No results for input(s): "CKTOTAL", "CKMB", "CKMBINDEX", "TROPONINI" in the last 168 hours. BNP (last 3 results) No results for input(s): "PROBNP" in the last 8760 hours. HbA1C: No results for input(s): "HGBA1C" in the last 72 hours. CBG: No results for input(s): "GLUCAP" in the last 168 hours. Lipid Profile: Recent Labs    07/01/22 0313  CHOL 221*  HDL 43  LDLCALC 165*  TRIG 64  CHOLHDL 5.1   Thyroid Function Tests: No results for input(s): "TSH", "T4TOTAL", "FREET4", "T3FREE", "THYROIDAB" in the last 72 hours. Anemia Panel: No results for input(s): "VITAMINB12", "FOLATE", "FERRITIN", "TIBC", "IRON", "RETICCTPCT" in the last 72 hours. Urine analysis:    Component Value Date/Time   COLORURINE YELLOW 06/28/2021 1615   APPEARANCEUR Cloudy (A) 06/28/2021 1615   LABSPEC 1.020 06/28/2021 1615   PHURINE 6.5 06/28/2021 1615   GLUCOSEU NEGATIVE 06/28/2021 1615   HGBUR NEGATIVE 06/28/2021 1615   BILIRUBINUR NEGATIVE 06/28/2021 1615   KETONESUR TRACE (A) 06/28/2021 1615   PROTEINUR NEGATIVE 09/07/2020 1131   UROBILINOGEN 2.0 (A) 06/28/2021 1615   NITRITE NEGATIVE 06/28/2021 1615   LEUKOCYTESUR MODERATE (A) 06/28/2021 1615   Sepsis Labs: @LABRCNTIP (procalcitonin:4,lacticidven:4)  )No results found for this or any previous visit (from the past 240 hour(s)).       Radiology Studies: CT Angio Chest PE W and/or Wo Contrast  Result Date: 07/01/2022 CLINICAL DATA:  Chest pain EXAM: CT ANGIOGRAPHY CHEST WITH CONTRAST TECHNIQUE: Multidetector CT imaging of the chest was performed using the standard protocol during bolus administration of intravenous contrast. Multiplanar CT image reconstructions and MIPs were obtained to evaluate the vascular anatomy. RADIATION DOSE REDUCTION: This exam was performed according to the departmental  dose-optimization program which includes automated exposure control, adjustment of the mA and/or kV according to patient size and/or use of iterative reconstruction technique. CONTRAST:  50mL OMNIPAQUE IOHEXOL 350 MG/ML SOLN COMPARISON:  Chest x-ray from earlier in the same day. FINDINGS: Cardiovascular: Atherosclerotic calcifications of the aorta are noted without aneurysmal dilatation or dissection. No cardiac enlargement is seen. The pulmonary artery shows a normal branching pattern bilaterally. No intraluminal filling defect to suggest pulmonary embolism is noted. Coronary calcifications are noted. Mediastinum/Nodes: Thoracic inlet is within normal limits. No hilar or mediastinal adenopathy is noted. The esophagus as visualized is within normal limits. Lungs/Pleura: Emphysematous changes are seen. Lungs are well aerated bilaterally. Patchy atelectatic changes are noted in right lower lobe. Upper Abdomen: No acute abnormality. Musculoskeletal: Degenerative changes of the thoracic spine are noted. Review of the MIP images confirms the above findings. IMPRESSION: No evidence of pulmonary emboli. Patchy right lower lobe atelectasis. Aortic Atherosclerosis (ICD10-I70.0) and Emphysema (ICD10-J43.9). Electronically Signed   By: Alcide Clever M.D.   On: 07/01/2022 00:42   DG Chest Port 1 View  Result Date: 06/30/2022 CLINICAL DATA:  cp EXAM: PORTABLE CHEST 1 VIEW COMPARISON:  Chest x-ray 09/07/2020, CT angio  chest 04/21/2016 FINDINGS: The heart and mediastinal contours are unchanged. Aortic calcification. No focal consolidation. Chronic coarsened interstitial markings with no overt pulmonary edema. No pleural effusion. No pneumothorax. No acute osseous abnormality. IMPRESSION: 1. No active disease. 2.  Aortic Atherosclerosis (ICD10-I70.0). Electronically Signed   By: Tish Frederickson M.D.   On: 06/30/2022 19:30        Scheduled Meds:  [START ON 07/02/2022] azithromycin  250 mg Oral Daily   cyanocobalamin  1,000  mcg Oral Daily   enoxaparin (LOVENOX) injection  40 mg Subcutaneous Q24H   gabapentin  400 mg Oral TID   ipratropium-albuterol  3 mL Nebulization Q6H   memantine  10 mg Oral BID   methylPREDNISolone (SOLU-MEDROL) injection  40 mg Intravenous Daily   nystatin  1 Application Topical TID   senna-docusate  1 tablet Oral QHS   sodium chloride flush  3 mL Intravenous Q12H   umeclidinium-vilanterol  1 puff Inhalation Daily   Continuous Infusions:  sodium chloride       LOS: 0 days   Time spent: 35 minutes.  Patient was admitted earlier today.  Berton Mount, MD

## 2022-07-02 DIAGNOSIS — J449 Chronic obstructive pulmonary disease, unspecified: Secondary | ICD-10-CM

## 2022-07-02 DIAGNOSIS — R0789 Other chest pain: Secondary | ICD-10-CM | POA: Diagnosis not present

## 2022-07-02 DIAGNOSIS — J9611 Chronic respiratory failure with hypoxia: Secondary | ICD-10-CM

## 2022-07-02 LAB — CBC WITH DIFFERENTIAL/PLATELET
Abs Immature Granulocytes: 0.02 10*3/uL (ref 0.00–0.07)
Basophils Absolute: 0 10*3/uL (ref 0.0–0.1)
Basophils Relative: 0 %
Eosinophils Absolute: 0 10*3/uL (ref 0.0–0.5)
Eosinophils Relative: 0 %
HCT: 33.4 % — ABNORMAL LOW (ref 36.0–46.0)
Hemoglobin: 10.9 g/dL — ABNORMAL LOW (ref 12.0–15.0)
Immature Granulocytes: 0 %
Lymphocytes Relative: 13 %
Lymphs Abs: 1.4 10*3/uL (ref 0.7–4.0)
MCH: 31.4 pg (ref 26.0–34.0)
MCHC: 32.6 g/dL (ref 30.0–36.0)
MCV: 96.3 fL (ref 80.0–100.0)
Monocytes Absolute: 0.9 10*3/uL (ref 0.1–1.0)
Monocytes Relative: 8 %
Neutro Abs: 8.6 10*3/uL — ABNORMAL HIGH (ref 1.7–7.7)
Neutrophils Relative %: 79 %
Platelets: 178 10*3/uL (ref 150–400)
RBC: 3.47 MIL/uL — ABNORMAL LOW (ref 3.87–5.11)
RDW: 12.5 % (ref 11.5–15.5)
WBC: 10.9 10*3/uL — ABNORMAL HIGH (ref 4.0–10.5)
nRBC: 0 % (ref 0.0–0.2)

## 2022-07-02 LAB — RENAL FUNCTION PANEL
Albumin: 3.1 g/dL — ABNORMAL LOW (ref 3.5–5.0)
Anion gap: 9 (ref 5–15)
BUN: 22 mg/dL (ref 8–23)
CO2: 25 mmol/L (ref 22–32)
Calcium: 8.6 mg/dL — ABNORMAL LOW (ref 8.9–10.3)
Chloride: 102 mmol/L (ref 98–111)
Creatinine, Ser: 1.19 mg/dL — ABNORMAL HIGH (ref 0.44–1.00)
GFR, Estimated: 46 mL/min — ABNORMAL LOW (ref >60)
Glucose, Bld: 115 mg/dL — ABNORMAL HIGH (ref 70–99)
Phosphorus: 4.1 mg/dL (ref 2.5–4.6)
Potassium: 4.6 mmol/L (ref 3.5–5.1)
Sodium: 136 mmol/L (ref 135–145)

## 2022-07-02 LAB — ECHOCARDIOGRAM COMPLETE
Height: 58 in
S' Lateral: 2.4 cm

## 2022-07-02 MED ORDER — GABAPENTIN 400 MG PO CAPS: 400.0000 mg | ORAL_CAPSULE | Freq: Three times a day (TID) | ORAL | 0 refills | Status: DC

## 2022-07-02 MED ORDER — POLYETHYLENE GLYCOL 3350 17 G PO PACK: 17.0000 g | PACK | Freq: Every day | ORAL | 0 refills | Status: AC | PRN

## 2022-07-02 MED ORDER — PREDNISONE 20 MG PO TABS: 40.0000 mg | ORAL_TABLET | Freq: Every day | ORAL | 0 refills | Status: AC

## 2022-07-02 MED ORDER — IPRATROPIUM-ALBUTEROL 0.5-2.5 (3) MG/3ML IN SOLN
3.0000 mL | Freq: Three times a day (TID) | RESPIRATORY_TRACT | Status: DC
  Administered 2022-07-02: 3 mL via RESPIRATORY_TRACT
  Filled 2022-07-02 (×2): qty 3

## 2022-07-02 MED ORDER — SENNOSIDES-DOCUSATE SODIUM 8.6-50 MG PO TABS: 1.0000 | ORAL_TABLET | Freq: Every day | ORAL | 0 refills | Status: AC

## 2022-07-02 MED ORDER — AZITHROMYCIN 250 MG PO TABS: 250.0000 mg | ORAL_TABLET | Freq: Every day | ORAL | 0 refills | Status: AC

## 2022-07-02 MED ORDER — ARTIFICIAL TEARS OPHTHALMIC OINT: TOPICAL_OINTMENT | OPHTHALMIC | 0 refills | Status: DC | PRN

## 2022-07-02 MED ORDER — FLUTICASONE PROPIONATE HFA 110 MCG/ACT IN AERO: 2.0000 | INHALATION_SPRAY | Freq: Two times a day (BID) | RESPIRATORY_TRACT | 2 refills | Status: DC

## 2022-07-02 NOTE — Progress Notes (Signed)
Progress Note  Patient Name: Megan Peterson Date of Encounter: 07/02/2022  Primary Cardiologist: None  Subjective   No acute events overnight.  No more chest pains.  Overall doing great.  Inpatient Medications    Scheduled Meds:  azithromycin  250 mg Oral Daily   cyanocobalamin  1,000 mcg Oral Daily   enoxaparin (LOVENOX) injection  30 mg Subcutaneous Q24H   gabapentin  400 mg Oral TID   ipratropium-albuterol  3 mL Nebulization TID   memantine  10 mg Oral BID   methylPREDNISolone (SOLU-MEDROL) injection  40 mg Intravenous Daily   nystatin  1 Application Topical TID   senna-docusate  1 tablet Oral QHS   sodium chloride flush  3 mL Intravenous Q12H   umeclidinium-vilanterol  1 puff Inhalation Daily   Continuous Infusions:  sodium chloride     PRN Meds: sodium chloride, acetaminophen, albuterol, artificial tears, guaiFENesin, HYDROcodone-acetaminophen, HYDROmorphone (DILAUDID) injection, nitroGLYCERIN, polyethylene glycol, sodium chloride flush   Vital Signs    Vitals:   07/01/22 1948 07/01/22 2323 07/02/22 0421 07/02/22 0735  BP:  115/80 119/69 135/67  Pulse:  (!) 108 90 83  Resp:  20 20 17   Temp:  98 F (36.7 C) 97.6 F (36.4 C) 97.9 F (36.6 C)  TempSrc:  Oral Oral Oral  SpO2: 95% 96% 97% 96%  Weight:      Height:        Intake/Output Summary (Last 24 hours) at 07/02/2022 0834 Last data filed at 07/01/2022 1300 Gross per 24 hour  Intake 483 ml  Output --  Net 483 ml   Filed Weights   06/30/22 1855  Weight: 63 kg    Telemetry     Personally reviewed NSR  Physical Exam   GEN: No acute distress.   Neck: No JVD. Cardiac: RRR, no murmur, rub, or gallop.  Respiratory: Nonlabored. Clear to auscultation bilaterally. GI: Soft, nontender, bowel sounds present. MS: No edema; No deformity. Neuro:  Nonfocal. Psych: Alert and oriented x 3. Normal affect.  Labs    Chemistry Recent Labs  Lab 06/30/22 1858 07/01/22 0313 07/02/22 0114  NA 140  --  136   K 4.6  --  4.6  CL 97*  --  102  CO2 28  --  25  GLUCOSE 103*  --  115*  BUN 19  --  22  CREATININE 1.40* 1.28* 1.19*  CALCIUM 9.1  --  8.6*  ALBUMIN  --   --  3.1*  GFRNONAA 38* 42* 46*  ANIONGAP 15  --  9     Hematology Recent Labs  Lab 06/30/22 1858 07/01/22 0313 07/02/22 0114  WBC 10.8* 9.9 10.9*  RBC 3.88 3.76* 3.47*  HGB 12.4 11.5* 10.9*  HCT 37.6 36.0 33.4*  MCV 96.9 95.7 96.3  MCH 32.0 30.6 31.4  MCHC 33.0 31.9 32.6  RDW 12.7 12.5 12.5  PLT 210 193 178    Cardiac Enzymes Recent Labs  Lab 06/30/22 1858 06/30/22 2223  TROPONINIHS 4 4    BNP Recent Labs  Lab 07/01/22 0311  BNP 35.7     DDimerNo results for input(s): "DDIMER" in the last 168 hours.   Radiology    ECHOCARDIOGRAM COMPLETE  Result Date: 07/02/2022    ECHOCARDIOGRAM REPORT   Patient Name:   Megan Peterson Date of Exam: 07/01/2022 Medical Rec #:  [id number]    Height:       58.0 in Accession #:    [id number]   Weight:  138.9 lb Date of Birth:  [birth year]    BSA:          1.560 m Patient Age:    82 years     BP:           118/65 mmHg Patient Gender: F            HR:           84 bpm. Exam Location:  Inpatient Procedure: 2D Echo, Color Doppler and Cardiac Doppler Indications:    chest pain  History:        Patient has prior history of Echocardiogram examinations, most                 recent 03/02/2021. COPD, Signs/Symptoms:Edema; Risk                 Factors:Dyslipidemia.  Sonographer:    Delcie Roch RDCS Referring Phys: [id number] CAROLE N HALL IMPRESSIONS  1. Left ventricular ejection fraction, by estimation, is 65 to 70%. The left ventricle has normal function. The left ventricle has no regional wall motion abnormalities. Left ventricular diastolic parameters are consistent with Grade I diastolic dysfunction (impaired relaxation).  2. Right ventricular systolic function is normal. The right ventricular size is normal. Tricuspid regurgitation signal is inadequate for assessing PA pressure.  3. The  mitral valve is normal in structure. Trivial mitral valve regurgitation.  4. The aortic valve is tricuspid. There is mild calcification of the aortic valve. There is mild thickening of the aortic valve. Aortic valve regurgitation is not visualized. Aortic valve sclerosis/calcification is present, without any evidence of aortic stenosis.  5. The inferior vena cava is dilated in size with >50% respiratory variability, suggesting right atrial pressure of 8 mmHg. Comparison(s): No significant change from prior study. FINDINGS  Left Ventricle: Left ventricular ejection fraction, by estimation, is 65 to 70%. The left ventricle has normal function. The left ventricle has no regional wall motion abnormalities. The left ventricular internal cavity size was normal in size. There is  no left ventricular hypertrophy. Left ventricular diastolic parameters are consistent with Grade I diastolic dysfunction (impaired relaxation). Right Ventricle: The right ventricular size is normal. Right vetricular wall thickness was not well visualized. Right ventricular systolic function is normal. Tricuspid regurgitation signal is inadequate for assessing PA pressure. Left Atrium: Left atrial size was normal in size. Right Atrium: Right atrial size was normal in size. Pericardium: There is no evidence of pericardial effusion. Presence of epicardial fat layer. Mitral Valve: The mitral valve is normal in structure. Trivial mitral valve regurgitation. Tricuspid Valve: The tricuspid valve is normal in structure. Tricuspid valve regurgitation is not demonstrated. Aortic Valve: The aortic valve is tricuspid. There is mild calcification of the aortic valve. There is mild thickening of the aortic valve. Aortic valve regurgitation is not visualized. Aortic valve sclerosis/calcification is present, without any evidence of aortic stenosis. Pulmonic Valve: The pulmonic valve was not well visualized. Pulmonic valve regurgitation is trivial. Aorta: The  aortic root is normal in size and structure. Venous: The inferior vena cava is dilated in size with greater than 50% respiratory variability, suggesting right atrial pressure of 8 mmHg. IAS/Shunts: The atrial septum is grossly normal.  LEFT VENTRICLE PLAX 2D LVIDd:         3.90 cm   Diastology LVIDs:         2.40 cm   LV e' lateral:   6.74 cm/s LV PW:         1.00 cm  LV E/e' lateral: 12.8 LV IVS:        0.80 cm LVOT diam:     1.90 cm LV SV:         68 LV SV Index:   43 LVOT Area:     2.84 cm  RIGHT VENTRICLE             IVC RV Basal diam:  2.00 cm     IVC diam: 2.10 cm RV S prime:     13.40 cm/s TAPSE (M-mode): 1.8 cm LEFT ATRIUM             Index        RIGHT ATRIUM          Index LA diam:        3.20 cm 2.05 cm/m   RA Area:     8.72 cm LA Vol (A2C):   28.2 ml 18.08 ml/m  RA Volume:   15.30 ml 9.81 ml/m LA Vol (A4C):   28.9 ml 18.53 ml/m LA Biplane Vol: 29.3 ml 18.79 ml/m  AORTIC VALVE LVOT Vmax:   125.00 cm/s LVOT Vmean:  87.800 cm/s LVOT VTI:    0.239 m  AORTA Ao Root diam: 2.50 cm MV E velocity: 86.50 cm/s MV A velocity: 157.00 cm/s  SHUNTS MV E/A ratio:  0.55         Systemic VTI:  0.24 m                             Systemic Diam: 1.90 cm Laurance Flatten MD Electronically signed by Laurance Flatten MD Signature Date/Time: 07/02/2022/6:48:59 AM    Final    CT Angio Chest PE W and/or Wo Contrast  Result Date: 07/01/2022 CLINICAL DATA:  Chest pain EXAM: CT ANGIOGRAPHY CHEST WITH CONTRAST TECHNIQUE: Multidetector CT imaging of the chest was performed using the standard protocol during bolus administration of intravenous contrast. Multiplanar CT image reconstructions and MIPs were obtained to evaluate the vascular anatomy. RADIATION DOSE REDUCTION: This exam was performed according to the departmental dose-optimization program which includes automated exposure control, adjustment of the mA and/or kV according to patient size and/or use of iterative reconstruction technique. CONTRAST:  50mL OMNIPAQUE  IOHEXOL 350 MG/ML SOLN COMPARISON:  Chest x-ray from earlier in the same day. FINDINGS: Cardiovascular: Atherosclerotic calcifications of the aorta are noted without aneurysmal dilatation or dissection. No cardiac enlargement is seen. The pulmonary artery shows a normal branching pattern bilaterally. No intraluminal filling defect to suggest pulmonary embolism is noted. Coronary calcifications are noted. Mediastinum/Nodes: Thoracic inlet is within normal limits. No hilar or mediastinal adenopathy is noted. The esophagus as visualized is within normal limits. Lungs/Pleura: Emphysematous changes are seen. Lungs are well aerated bilaterally. Patchy atelectatic changes are noted in right lower lobe. Upper Abdomen: No acute abnormality. Musculoskeletal: Degenerative changes of the thoracic spine are noted. Review of the MIP images confirms the above findings. IMPRESSION: No evidence of pulmonary emboli. Patchy right lower lobe atelectasis. Aortic Atherosclerosis (ICD10-I70.0) and Emphysema (ICD10-J43.9). Electronically Signed   By: Alcide Clever M.D.   On: 07/01/2022 00:42   DG Chest Port 1 View  Result Date: 06/30/2022 CLINICAL DATA:  cp EXAM: PORTABLE CHEST 1 VIEW COMPARISON:  Chest x-ray 09/07/2020, CT angio chest 04/21/2016 FINDINGS: The heart and mediastinal contours are unchanged. Aortic calcification. No focal consolidation. Chronic coarsened interstitial markings with no overt pulmonary edema. No pleural effusion. No pneumothorax. No acute osseous abnormality. IMPRESSION: 1. No  active disease. 2.  Aortic Atherosclerosis (ICD10-I70.0). Electronically Signed   By: Tish Frederickson M.D.   On: 06/30/2022 19:30    Cardiac Studies     Assessment & Plan    # Noncardiac chest pain -Chest pain occurs at rest and not/never with exertion.  CT cardiac from 3/23 showed mild CAD with negative FFR.  Echocardiogram from 3/23 showed normal LVEF, mild pulmonary HTN and no significant valvular disease; echocardiogram  from this admission showed normal LVEF and no significant valvular disease as well.  Follow-up with PCP for noncardiac causes of chest pain.   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  [facility] will sign off.   Medication Recommendations: Continue home medications Other recommendations (labs, testing, etc): None Follow up as an outpatient: No cardiology follow-up required   Signed, Marjo Bicker, MD  07/02/2022, 8:34 AM

## 2022-07-02 NOTE — Discharge Summary (Signed)
Physician Discharge Summary  Patient ID: Megan Peterson MRN: [id number] DOB/AGE: December 12, 1940 82 y.o.  Admit date: 06/30/2022 Discharge date: 07/02/2022  Admission Diagnoses:  Discharge Diagnoses:  Principal Problem:   Atypical chest pain   Discharged Condition: stable  Hospital Course:  Patient is an 82 year old female with past medical history significant for moderate to advanced dementia, chronic low back pain on chronic opiates and COPD.  Patient is on oxygen 2 L/min via nasal cannula at home.  Patient is a reformed cigarette smoker.  Patient has history of hyperlipidemia, but will not agree to take statins.  Patient was admitted with chest pain and likely COPD exacerbation.  Troponins were cycled and they came back negative.  Echocardiogram did not reveal any changes.  Cardiology team was consulted to assist with patient's management.  Cardiology team has deemed that patient's chest pain was noncardiac.  Patient has been cleared for discharge.  Respiratory symptoms have improved.  Patient will be discharged on oral prednisone.  Patient will follow-up with PCP, cardiology and pulmonary team on discharge.  Chest pain, rule out ACS/unstable angina: -Negative troponins. -EKG reveals low voltage EKG. echocardiogram is nonrevealing. -Patient is currently chest pain-free. -Seen by cardiology team and cleared for discharge.    COPD with exacerbation: -Patient was managed with IV Solu-Medrol and neb treatment.  Patient was also managed with azithromycin. -Respiratory symptoms have resolved significantly. -Discharge patient to home on oral prednisone and nebulizer treatment.  Chronic lower back pain Polyneuropathy  -On chronic opiates and high doses of gabapentin -Minimize mind altering medications. -Patient is currently on gabapentin 400 Mg p.o. 3 times daily, hydrocodone/acetaminophen 5/325 1 tab p.o. every 6 hourly as needed, IV Dilaudid 0.5 Mg every 4 hours as needed. -Continue to adjust  medication.   Physical debility PT OT assessment Fall precautions.   Dementia Resumed home [facility] as needed   Chronic hypoxic respiratory failure On 2 L nasal cannula at her baseline Continue to maintain her O2 saturation above 92%.    Consults: cardiology  Significant Diagnostic Studies:  Echocardiogram reviewed: 1. Left ventricular ejection fraction, by estimation, is 65 to 70%. The  left ventricle has normal function. The left ventricle has no regional  wall motion abnormalities. Left ventricular diastolic parameters are  consistent with Grade I diastolic  dysfunction (impaired relaxation).   2. Right ventricular systolic function is normal. The right ventricular  size is normal. Tricuspid regurgitation signal is inadequate for assessing  PA pressure.   3. The mitral valve is normal in structure. Trivial mitral valve  regurgitation.   4. The aortic valve is tricuspid. There is mild calcification of the  aortic valve. There is mild thickening of the aortic valve. Aortic valve  regurgitation is not visualized. Aortic valve sclerosis/calcification is  present, without any evidence of  aortic stenosis.   5. The inferior vena cava is dilated in size with >50% respiratory  variability, suggesting right atrial pressure of 8 mmHg.   Troponins came back negative.   Discharge Exam: Blood pressure 135/67, pulse 83, temperature 97.9 F (36.6 C), temperature source Oral, resp. rate 17, height 4\' 10"  (1.473 m), weight 63 kg, SpO2 97 %.   Disposition: Discharge disposition: 01-Home or Self Care       Discharge Instructions     Diet - low sodium heart healthy   Complete by: As directed    Increase activity slowly   Complete by: As directed       Allergies as of 07/02/2022  Reactions   Buspar [buspirone] Other (See Comments)   Dizzy and nauseated.   Cymbalta [duloxetine Hcl] Other (See Comments)   fatigue   Lexapro [escitalopram Oxalate] Other (See  Comments)   Per patient, intolerant   Meperidine Hcl Other (See Comments)   unknown   Mirtazapine    Sedation, altered mentation.     Morphine And Codeine Other (See Comments)   Vomiting    Sertraline    Sedation with 50mg  use.    Statins Other (See Comments)   Myalgias on statins        Medication List     STOP taking these medications    gabapentin 800 MG tablet Commonly known as: NEURONTIN Replaced by: gabapentin 400 MG capsule   ondansetron 4 MG tablet Commonly known as: ZOFRAN   triamcinolone cream 0.5 % Commonly known as: KENALOG       TAKE these medications    albuterol 0.63 MG/3ML nebulizer solution Commonly known as: ACCUNEB Take 3 mLs (0.63 mg total) by nebulization every 6 (six) hours as needed for wheezing.   albuterol 108 (90 Base) MCG/ACT inhaler Commonly known as: VENTOLIN HFA INHALE 1 TO 2 PUFFS BY MOUTH EVERY 6 HOURS AS NEEDED FOR WHEEZING FOR SHORTNESS OF BREATH   Anoro Ellipta 62.5-25 MCG/ACT Aepb Generic drug: umeclidinium-vilanterol Inhale 1 puff by mouth once daily   artificial tears Oint ophthalmic ointment Commonly known as: LACRILUBE Place into both eyes every 4 (four) hours as needed for dry eyes.   azithromycin 250 MG tablet Commonly known as: ZITHROMAX Take 1 tablet (250 mg total) by mouth daily for 4 days.   cyanocobalamin 1000 MCG tablet Commonly known as: VITAMIN B12 Take 1 tablet (1,000 mcg total) by mouth daily.   fluticasone 110 MCG/ACT inhaler Commonly known as: FLOVENT HFA Inhale 2 puffs into the lungs 2 (two) times daily.   furosemide 20 MG tablet Commonly known as: LASIX TAKE 1 TABLET BY MOUTH ONCE DAILY AS NEEDED IN THE MORNING FOR FLUID   gabapentin 400 MG capsule Commonly known as: NEURONTIN Take 1 capsule (400 mg total) by mouth 3 (three) times daily. Replaces: gabapentin 800 MG tablet   HYDROcodone-acetaminophen 5-325 MG tablet Commonly known as: NORCO/VICODIN Take 1 tablet by mouth every 6 (six)  hours as needed for moderate pain.   memantine 10 MG tablet Commonly known as: NAMENDA Take 1 tablet by mouth twice daily   metoprolol succinate 25 MG 24 hr tablet Commonly known as: TOPROL-XL Take 1 tablet (25 mg total) by mouth daily.   nitroGLYCERIN 0.4 MG SL tablet Commonly known as: NITROSTAT Place 0.4 mg under the tongue every 5 (five) minutes as needed for chest pain (max 3 doses in 15 minutes.  if still having chest pain then go to the ER).   nystatin powder Commonly known as: MYCOSTATIN/NYSTOP Apply 1 Application topically 3 (three) times daily.   OXYGEN Inhale 2 L into the lungs at bedtime.   polyethylene glycol 17 g packet Commonly known as: MIRALAX / GLYCOLAX Take 17 g by mouth daily as needed for mild constipation.   predniSONE 20 MG tablet Commonly known as: DELTASONE Take 2 tablets (40 mg total) by mouth daily with breakfast for 5 days. 2 tabs po daily for 5 days, then 1 tab po daily for 5 days What changed:  how much to take how to take this when to take this   senna-docusate 8.6-50 MG tablet Commonly known as: Senokot-S Take 1 tablet by mouth at bedtime for  10 days.        Time spent: 35 minutes.   SignedBarnetta Chapel 07/02/2022, 11:35 AM

## 2022-07-03 ENCOUNTER — Other Ambulatory Visit: Payer: Self-pay | Admitting: Family Medicine

## 2022-07-03 ENCOUNTER — Telehealth: Payer: Self-pay | Admitting: Family Medicine

## 2022-07-03 NOTE — Telephone Encounter (Signed)
06/30/22

## 2022-07-03 NOTE — Telephone Encounter (Signed)
Noted.  See ER note.  Thanks.  

## 2022-07-03 NOTE — Telephone Encounter (Signed)
Per chart review tab pt seen [place] on 06/30/22.sending note to Dr Para March and Para March pool.

## 2022-07-04 ENCOUNTER — Telehealth: Payer: Self-pay | Admitting: Family Medicine

## 2022-07-04 ENCOUNTER — Ambulatory Visit: Payer: Self-pay

## 2022-07-04 DIAGNOSIS — R413 Other amnesia: Secondary | ICD-10-CM

## 2022-07-04 NOTE — Telephone Encounter (Signed)
Called and spoke with patients daughter [name] about the medications. She verbalized understanding of everything discussed. Before ending conversation she stated that she had a complaint she needed dealt with regarding the [facility] referral. She was not happy with having to use [facility]. She was told there was only on PT person in the whole company and only one aide there as well and the aide is a female. She is not comfortable having a man take care of her mother when she can not. She is requesting referral be sent somewhere else.

## 2022-07-04 NOTE — Telephone Encounter (Signed)
Referral coordinator said new referral needs to be placed as the other has already been closed. Megan Peterson stated she would like [facility] or [facility] if possible.

## 2022-07-04 NOTE — Telephone Encounter (Signed)
Pt's daughter, [name], called requesting advise on how to manage the pt's new med changes. [name] stated the pt recently was in the hosp from Fri, 6/28 to Sun, 6/30 for chest pain. [name] stated the hosp wasn't able to find a reason behind the chest pains. [name] stated the hosp provider, Dr. Dartha Lodge changed her gabapentin from 800 mg to 400 mg, gave a pt a new inhaler (fluticasone (FLOVENT HFA) 110 MCG/ACT inhaler), took pt off Zofran & Triamcinolone (Kenalog) cream. [name] stated Dr. Dartha Lodge believed the pt began to be immune to the cream. [name] stated it was much more changes with the pt's meds & due to the pt having dementia, she stated she really needs more understanding with the changes. Call back # [phone]

## 2022-07-04 NOTE — Patient Instructions (Signed)
Visit Information  Thank you for taking time to visit with me today. Please don't hesitate to contact me if I can be of assistance to you.   Following are the goals we discussed today:   Goals Addressed             This Visit's Progress    Respite       Care Coordination Interventions: Patients' caregiver is needing Respite         If you are experiencing a Mental Health or Behavioral Health Crisis or need someone to talk to, please call 911  Patient verbalizes understanding of instructions and care plan provided today and agrees to view in MyChart. Active MyChart status and patient understanding of how to access instructions and care plan via MyChart confirmed with patient.     No further follow up required:    Lysle Morales, BSW Social Worker [facility]  [phone]

## 2022-07-04 NOTE — Telephone Encounter (Signed)
Do you want patient to make an appt or can this be something you can advise on now? Next open appt is on Friday.

## 2022-07-04 NOTE — Patient Outreach (Signed)
  Care Coordination   Follow Up Visit Note   07/04/2022 Name: Megan Peterson MRN: [id number] DOB: May 07, 1940  Megan Peterson is a 82 y.o. year old female who sees Joaquim Nam, MD for primary care. I spoke with  Megan Peterson daughter Megan Peterson by phone today.  What matters to the patients health and wellness today?  Patients caregiver needs respite.  Wants other options that are affordable and less than 30 days.    Goals Addressed             This Visit's Progress    Respite       Care Coordination Interventions: Patients' caregiver is needing Respite         SDOH assessments and interventions completed:  No     Care Coordination Interventions:  Yes, provided   Interventions Today    Flowsheet Row Most Recent Value  General Interventions   General Interventions Discussed/Reviewed General Interventions Reviewed  [Patient was recently assessed and informed she is not eligible for hospice.  [facility] and insurance company provided respite resources but patient can not afford.  SW to provide private duty options via email.  Family is not willing to assist.]        Follow up plan: No further intervention required.   Encounter Outcome:  Pt. Visit Completed

## 2022-07-04 NOTE — Telephone Encounter (Signed)
Please let her know that using the inhaler makes sense.  Would need to rinse after use.    Reasonable to try to lower gabapentin to 400mg  per dose, assuming pain is controlled.  We may need to adjust that again in the future.    Zofran and TAC would be PRN.  If she isn't irritated and not having nausea, she wouldn't need to use those.    Please let me know about her thoughts re: the above.  Thanks.

## 2022-07-05 NOTE — Telephone Encounter (Signed)
Ordered. Thanks

## 2022-07-05 NOTE — Addendum Note (Signed)
Addended by: Joaquim Nam on: 07/05/2022 03:34 PM   Modules accepted: Orders

## 2022-07-07 ENCOUNTER — Telehealth: Payer: Self-pay | Admitting: Family Medicine

## 2022-07-07 DIAGNOSIS — R413 Other amnesia: Secondary | ICD-10-CM

## 2022-07-07 NOTE — Telephone Encounter (Signed)
Called and spoke with patient/family, patients daughter stated she requested palliative care through either [facility] or [facility].

## 2022-07-07 NOTE — Telephone Encounter (Signed)
See below.  Please verify with family/patient and I'll sign the order.  Thanks.   ===========================   Mrs. Burne daughter reached out to [place] yesterday to request an evaluation of her mother for hospice services. She said that she feels that her mother would get more care with hospice vs HH. Is there any way that we can get a referral to eval her? If she is eligible, we will be more than happy to provide services for her. I will let you know the outcome either way!   Thank you in advance,  Trena Platt, RN  [facility]

## 2022-07-08 ENCOUNTER — Emergency Department (HOSPITAL_COMMUNITY): Payer: Medicare HMO

## 2022-07-08 ENCOUNTER — Encounter (HOSPITAL_COMMUNITY): Payer: Self-pay

## 2022-07-08 ENCOUNTER — Other Ambulatory Visit: Payer: Self-pay

## 2022-07-08 ENCOUNTER — Emergency Department (HOSPITAL_COMMUNITY)
Admission: EM | Admit: 2022-07-08 | Discharge: 2022-07-12 | Disposition: A | Discharge status: Home / Self Care | Payer: Medicare HMO | Attending: Emergency Medicine | Admitting: Emergency Medicine

## 2022-07-08 DIAGNOSIS — R4182 Altered mental status, unspecified: Secondary | ICD-10-CM | POA: Diagnosis not present

## 2022-07-08 DIAGNOSIS — R5383 Other fatigue: Secondary | ICD-10-CM | POA: Insufficient documentation

## 2022-07-08 DIAGNOSIS — R0789 Other chest pain: Secondary | ICD-10-CM | POA: Diagnosis not present

## 2022-07-08 DIAGNOSIS — Z7951 Long term (current) use of inhaled steroids: Secondary | ICD-10-CM | POA: Insufficient documentation

## 2022-07-08 DIAGNOSIS — R6889 Other general symptoms and signs: Secondary | ICD-10-CM | POA: Diagnosis not present

## 2022-07-08 DIAGNOSIS — F0390 Unspecified dementia without behavioral disturbance: Secondary | ICD-10-CM | POA: Diagnosis not present

## 2022-07-08 DIAGNOSIS — N3000 Acute cystitis without hematuria: Secondary | ICD-10-CM

## 2022-07-08 DIAGNOSIS — R531 Weakness: Secondary | ICD-10-CM | POA: Diagnosis not present

## 2022-07-08 DIAGNOSIS — J449 Chronic obstructive pulmonary disease, unspecified: Secondary | ICD-10-CM | POA: Insufficient documentation

## 2022-07-08 DIAGNOSIS — R404 Transient alteration of awareness: Secondary | ICD-10-CM | POA: Diagnosis not present

## 2022-07-08 DIAGNOSIS — R9082 White matter disease, unspecified: Secondary | ICD-10-CM | POA: Diagnosis not present

## 2022-07-08 LAB — COMPREHENSIVE METABOLIC PANEL
ALT: 14 U/L (ref 0–44)
AST: 16 U/L (ref 15–41)
Albumin: 3.4 g/dL — ABNORMAL LOW (ref 3.5–5.0)
Alkaline Phosphatase: 92 U/L (ref 38–126)
Anion gap: 7 (ref 5–15)
BUN: 28 mg/dL — ABNORMAL HIGH (ref 8–23)
CO2: 30 mmol/L (ref 22–32)
Calcium: 8.7 mg/dL — ABNORMAL LOW (ref 8.9–10.3)
Chloride: 102 mmol/L (ref 98–111)
Creatinine, Ser: 1.26 mg/dL — ABNORMAL HIGH (ref 0.44–1.00)
GFR, Estimated: 43 mL/min — ABNORMAL LOW (ref >60)
Glucose, Bld: 84 mg/dL (ref 70–99)
Potassium: 4.1 mmol/L (ref 3.5–5.1)
Sodium: 139 mmol/L (ref 135–145)
Total Bilirubin: 0.6 mg/dL (ref 0.3–1.2)
Total Protein: 6.7 g/dL (ref 6.5–8.1)

## 2022-07-08 LAB — URINALYSIS, ROUTINE W REFLEX MICROSCOPIC
Bilirubin Urine: NEGATIVE
Glucose, UA: NEGATIVE mg/dL
Hgb urine dipstick: NEGATIVE
Ketones, ur: 5 mg/dL — AB
Nitrite: NEGATIVE
Protein, ur: NEGATIVE mg/dL
Specific Gravity, Urine: 1.017 (ref 1.005–1.030)
WBC, UA: >50 WBC/hpf (ref 0–5)
pH: 6 (ref 5.0–8.0)

## 2022-07-08 LAB — CBC WITH DIFFERENTIAL/PLATELET
Abs Immature Granulocytes: 0.03 10*3/uL (ref 0.00–0.07)
Basophils Absolute: 0.1 10*3/uL (ref 0.0–0.1)
Basophils Relative: 1 %
Eosinophils Absolute: 0.3 10*3/uL (ref 0.0–0.5)
Eosinophils Relative: 3 %
HCT: 40.9 % (ref 36.0–46.0)
Hemoglobin: 13 g/dL (ref 12.0–15.0)
Immature Granulocytes: 0 %
Lymphocytes Relative: 25 %
Lymphs Abs: 2.9 10*3/uL (ref 0.7–4.0)
MCH: 31.2 pg (ref 26.0–34.0)
MCHC: 31.8 g/dL (ref 30.0–36.0)
MCV: 98.1 fL (ref 80.0–100.0)
Monocytes Absolute: 1.1 10*3/uL — ABNORMAL HIGH (ref 0.1–1.0)
Monocytes Relative: 10 %
Neutro Abs: 7.1 10*3/uL (ref 1.7–7.7)
Neutrophils Relative %: 61 %
Platelets: 258 10*3/uL (ref 150–400)
RBC: 4.17 MIL/uL (ref 3.87–5.11)
RDW: 12.8 % (ref 11.5–15.5)
WBC: 11.5 10*3/uL — ABNORMAL HIGH (ref 4.0–10.5)
nRBC: 0 % (ref 0.0–0.2)

## 2022-07-08 LAB — BLOOD GAS, VENOUS
Acid-Base Excess: 10.2 mmol/L — ABNORMAL HIGH (ref 0.0–2.0)
Bicarbonate: 38.2 mmol/L — ABNORMAL HIGH (ref 20.0–28.0)
O2 Saturation: 33.2 %
Patient temperature: 37
pCO2, Ven: 66 mmHg — ABNORMAL HIGH (ref 44–60)
pH, Ven: 7.37 (ref 7.25–7.43)
pO2, Ven: <31 mmHg — CL (ref 32–45)

## 2022-07-08 LAB — AMMONIA: Ammonia: <10 umol/L (ref 9–35)

## 2022-07-08 LAB — CBG MONITORING, ED: Glucose-Capillary: 80 mg/dL (ref 70–99)

## 2022-07-08 MED ORDER — GABAPENTIN 100 MG PO CAPS
200.0000 mg | ORAL_CAPSULE | Freq: Three times a day (TID) | ORAL | Status: DC
  Administered 2022-07-08 – 2022-07-12 (×12): 200 mg via ORAL
  Filled 2022-07-08 (×11): qty 2

## 2022-07-08 MED ORDER — GABAPENTIN 400 MG PO CAPS
400.0000 mg | ORAL_CAPSULE | Freq: Three times a day (TID) | ORAL | Status: DC
  Filled 2022-07-08: qty 1

## 2022-07-08 MED ORDER — ARTIFICIAL TEARS OPHTHALMIC OINT
TOPICAL_OINTMENT | OPHTHALMIC | Status: DC | PRN
  Filled 2022-07-08: qty 3.5

## 2022-07-08 MED ORDER — VITAMIN B-12 1000 MCG PO TABS
1000.0000 ug | ORAL_TABLET | Freq: Every day | ORAL | Status: DC
  Administered 2022-07-08 – 2022-07-12 (×5): 1000 ug via ORAL
  Filled 2022-07-08 (×5): qty 1

## 2022-07-08 MED ORDER — ALBUTEROL SULFATE (2.5 MG/3ML) 0.083% IN NEBU: 2.5000 mg | INHALATION_SOLUTION | Freq: Four times a day (QID) | RESPIRATORY_TRACT | Status: DC | PRN

## 2022-07-08 MED ORDER — UMECLIDINIUM-VILANTEROL 62.5-25 MCG/ACT IN AEPB
1.0000 | INHALATION_SPRAY | Freq: Every day | RESPIRATORY_TRACT | Status: DC
  Administered 2022-07-09 – 2022-07-12 (×4): 1 via RESPIRATORY_TRACT
  Filled 2022-07-08: qty 14

## 2022-07-08 MED ORDER — POLYETHYLENE GLYCOL 3350 17 G PO PACK
17.0000 g | PACK | Freq: Every day | ORAL | Status: DC | PRN
  Administered 2022-07-10 – 2022-07-12 (×2): 17 g via ORAL
  Filled 2022-07-08 (×2): qty 1

## 2022-07-08 MED ORDER — MEMANTINE HCL 5 MG PO TABS
10.0000 mg | ORAL_TABLET | Freq: Two times a day (BID) | ORAL | Status: DC
  Administered 2022-07-08 – 2022-07-12 (×8): 10 mg via ORAL
  Filled 2022-07-08 (×8): qty 2

## 2022-07-08 MED ORDER — SENNOSIDES-DOCUSATE SODIUM 8.6-50 MG PO TABS
1.0000 | ORAL_TABLET | Freq: Every day | ORAL | Status: DC
  Administered 2022-07-08 – 2022-07-11 (×4): 1 via ORAL
  Filled 2022-07-08 (×4): qty 1

## 2022-07-08 MED ORDER — FUROSEMIDE 20 MG PO TABS: 20.0000 mg | ORAL_TABLET | Freq: Every day | ORAL | Status: DC | PRN

## 2022-07-08 MED ORDER — ALBUTEROL SULFATE HFA 108 (90 BASE) MCG/ACT IN AERS: 1.0000 | INHALATION_SPRAY | Freq: Four times a day (QID) | RESPIRATORY_TRACT | Status: DC | PRN

## 2022-07-08 MED ORDER — HYDROCODONE-ACETAMINOPHEN 5-325 MG PO TABS
1.0000 | ORAL_TABLET | Freq: Four times a day (QID) | ORAL | Status: DC | PRN
  Administered 2022-07-08 – 2022-07-12 (×7): 1 via ORAL
  Filled 2022-07-08 (×7): qty 1

## 2022-07-08 MED ORDER — METOPROLOL SUCCINATE ER 50 MG PO TB24
25.0000 mg | ORAL_TABLET | Freq: Every day | ORAL | Status: DC
  Administered 2022-07-08 – 2022-07-12 (×5): 25 mg via ORAL
  Filled 2022-07-08 (×5): qty 1

## 2022-07-08 MED ORDER — FLUTICASONE PROPIONATE HFA 110 MCG/ACT IN AERO: 2.0000 | INHALATION_SPRAY | Freq: Two times a day (BID) | RESPIRATORY_TRACT | Status: DC

## 2022-07-08 MED ORDER — BUDESONIDE 0.5 MG/2ML IN SUSP
0.5000 mg | Freq: Two times a day (BID) | RESPIRATORY_TRACT | Status: DC
  Administered 2022-07-08 – 2022-07-12 (×7): 0.5 mg via RESPIRATORY_TRACT
  Filled 2022-07-08 (×10): qty 2

## 2022-07-08 NOTE — ED Notes (Signed)
Pt has an IV that will run fluids/Rx.  I could not pull back blood from it for blood work.  She had an IV [place] but pulled it out earlier.

## 2022-07-08 NOTE — ED Provider Notes (Signed)
[place] EMERGENCY DEPARTMENT AT [facility] Provider Note   CSN: [id number] Arrival date & time: 07/08/22  1251     History  Chief Complaint  Patient presents with  . Altered Mental Status    Megan Peterson is a 82 y.o. female.  Patient is an 82 year old female with a past medical history of dementia, COPD on 2 L nasal cannula, chronic pain on chronic narcotics and gabapentin presenting to the emergency department with altered mental status.  Patient was recently discharged from the hospital for COPD exacerbation and was sent home on azithromycin.  Per EMS since the patient has been home she has been more confused and combative than usual.  Patient denies any pain to me.  She does report constipation.  She denies any known black or bloody stool.  Patient's daughter at bedside reports that over the last 2 to 3 days she has been increasingly fatigued and sleeping more often than usual.  She states that she seems to be dragging her feet more when she is up and walking.  She states that she did report having to strain her bowel movement this morning but was able to have a bowel movement and is urinating normally.  She denies any trauma or falls.  She reports that she is ANO x 2-3 at baseline.  She reports that her gabapentin was decreased from 800 to 400 mg during her last hospitalization but otherwise has been taking her medications as prescribed the same way that she has been taking for years without any other changes.  She states that her pain has been well-controlled since she has been home from the hospital.  The history is provided by the EMS personnel and a relative. History limited by: Level 5 caveat for dementia.  Altered Mental Status      Home Medications Prior to Admission medications   Medication Sig Start Date End Date Taking? Authorizing Provider  albuterol (ACCUNEB) 0.63 MG/3ML nebulizer solution Take 3 mLs (0.63 mg total) by nebulization every 6 (six) hours as  needed for wheezing. 06/22/21   Oretha Milch, MD  albuterol (VENTOLIN HFA) 108 (90 Base) MCG/ACT inhaler INHALE 1 TO 2 PUFFS BY MOUTH EVERY 6 HOURS AS NEEDED FOR WHEEZING FOR SHORTNESS OF BREATH 05/03/22   Joaquim Nam, MD  artificial tears (LACRILUBE) OINT ophthalmic ointment Place into both eyes every 4 (four) hours as needed for dry eyes. 07/02/22   Barnetta Chapel, MD  fluticasone (FLOVENT HFA) 110 MCG/ACT inhaler Inhale 2 puffs into the lungs 2 (two) times daily. 07/02/22 07/02/23  Berton Mount I, MD  furosemide (LASIX) 20 MG tablet TAKE 1 TABLET BY MOUTH ONCE DAILY AS NEEDED IN THE MORNING FOR FLUID 07/04/22   Joaquim Nam, MD  gabapentin (NEURONTIN) 400 MG capsule Take 1 capsule (400 mg total) by mouth 3 (three) times daily. 07/02/22 08/01/22  Barnetta Chapel, MD  HYDROcodone-acetaminophen (NORCO/VICODIN) 5-325 MG tablet Take 1 tablet by mouth every 6 (six) hours as needed for moderate pain. 05/10/22   Joaquim Nam, MD  memantine [facility]) 10 MG tablet Take 1 tablet by mouth twice daily 04/04/22   Drema Dallas, DO  metoprolol succinate (TOPROL-XL) 25 MG 24 hr tablet Take 1 tablet (25 mg total) by mouth daily. 12/09/21   Joaquim Nam, MD  nitroGLYCERIN (NITROSTAT) 0.4 MG SL tablet Place 0.4 mg under the tongue every 5 (five) minutes as needed for chest pain (max 3 doses in 15 minutes.  if still having chest pain then go to the ER). 06/13/21   Reed, Tiffany L, DO  nystatin (MYCOSTATIN/NYSTOP) powder Apply 1 Application topically 3 (three) times daily. 05/10/22   Joaquim Nam, MD  OXYGEN Inhale 2 L into the lungs at bedtime.    [provider]  polyethylene glycol (MIRALAX / GLYCOLAX) 17 g packet Take 17 g by mouth daily as needed for mild constipation. 07/02/22   Barnetta Chapel, MD  senna-docusate (SENOKOT-S) 8.6-50 MG tablet Take 1 tablet by mouth at bedtime for 10 days. 07/02/22 07/12/22  Barnetta Chapel, MD  umeclidinium-vilanterol [facility] ELLIPTA) 62.5-25  MCG/ACT AEPB Inhale 1 puff by mouth once daily 02/11/22   Joaquim Nam, MD  vitamin B-12 (CYANOCOBALAMIN) 1000 MCG tablet Take 1 tablet (1,000 mcg total) by mouth daily. 06/29/21   Joaquim Nam, MD      Allergies    Buspar [buspirone], Cymbalta [duloxetine hcl], Lexapro [escitalopram oxalate], Meperidine hcl, Mirtazapine, Morphine and codeine, Sertraline, and Statins    Review of Systems   Review of Systems  Physical Exam Updated Vital Signs BP (!) 213/110 (BP Location: Left Arm)   Pulse (!) 106   Temp 97.7 F (36.5 C) (Oral)   Resp 17   Ht 4\' 10"  (1.473 m)   Wt 64 kg   SpO2 96%   BMI 29.49 kg/m  Physical Exam Vitals and nursing note reviewed.  Constitutional:      General: She is not in acute distress.    Appearance: Normal appearance.  HENT:     Head: Normocephalic and atraumatic.     Nose: Nose normal.     Mouth/Throat:     Mouth: Mucous membranes are dry.     Pharynx: Oropharynx is clear.  Eyes:     Extraocular Movements: Extraocular movements intact.     Conjunctiva/sclera: Conjunctivae normal.     Pupils: Pupils are equal, round, and reactive to light.  Cardiovascular:     Rate and Rhythm: Normal rate and regular rhythm.     Heart sounds: Normal heart sounds.  Pulmonary:     Effort: Pulmonary effort is normal.     Breath sounds: Normal breath sounds.  Abdominal:     General: Abdomen is flat.     Palpations: Abdomen is soft.     Tenderness: There is abdominal tenderness (Diffuse). There is no guarding or rebound.  Musculoskeletal:        General: Normal range of motion.     Cervical back: Normal range of motion.     Right lower leg: Edema (Trace) present.     Left lower leg: Edema (Trace) present.  Skin:    General: Skin is warm.  Neurological:     General: No focal deficit present.     Mental Status: She is alert. Mental status is at baseline.  Psychiatric:        Mood and Affect: Mood normal.        Behavior: Behavior normal.     ED Results  / Procedures / Treatments   Labs (all labs ordered are listed, but only abnormal results are displayed) Labs Reviewed  COMPREHENSIVE METABOLIC PANEL - Abnormal; Notable for the following components:      Result Value   BUN 28 (*)    Creatinine, Ser 1.26 (*)    Calcium 8.7 (*)    Albumin 3.4 (*)    GFR, Estimated 43 (*)    All other components within normal limits  CBC WITH  DIFFERENTIAL/PLATELET - Abnormal; Notable for the following components:   WBC 11.5 (*)    Monocytes Absolute 1.1 (*)    All other components within normal limits  URINALYSIS, ROUTINE W REFLEX MICROSCOPIC - Abnormal; Notable for the following components:   APPearance HAZY (*)    Ketones, ur 5 (*)    Leukocytes,Ua SMALL (*)    Bacteria, UA RARE (*)    All other components within normal limits  BLOOD GAS, VENOUS - Abnormal; Notable for the following components:   pCO2, Ven 66 (*)    pO2, Ven <31 (*)    Bicarbonate 38.2 (*)    Acid-Base Excess 10.2 (*)    All other components within normal limits  URINE CULTURE  AMMONIA  CBG MONITORING, ED    EKG EKG Interpretation Date/Time:  Saturday July 08 2022 15:05:52 EDT Ventricular Rate:  81 PR Interval:  151 QRS Duration:  79 QT Interval:  386 QTC Calculation: 448 R Axis:   22  Text Interpretation: Sinus rhythm Low voltage, precordial leads No significant change since last tracing Confirmed by Elayne Snare ([id number]) on 07/08/2022 3:07:38 PM  Radiology CT Head Wo Contrast  Result Date: 07/08/2022 CLINICAL DATA:  Altered mental status EXAM: CT HEAD WITHOUT CONTRAST TECHNIQUE: Contiguous axial images were obtained from the base of the skull through the vertex without intravenous contrast. RADIATION DOSE REDUCTION: This exam was performed according to the departmental dose-optimization program which includes automated exposure control, adjustment of the mA and/or kV according to patient size and/or use of iterative reconstruction technique. COMPARISON:  09/07/2020  FINDINGS: Brain: No evidence of acute infarction, hemorrhage, hydrocephalus, extra-axial collection or mass lesion/mass effect. Periventricular white matter hypodensity. Vascular: No hyperdense vessel or unexpected calcification. Skull: Normal. Negative for fracture or focal lesion. Sinuses/Orbits: No acute finding. Other: None. IMPRESSION: No acute intracranial pathology. Small-vessel white matter disease. Electronically Signed   By: Jearld Lesch M.D.   On: 07/08/2022 16:00   DG Chest Port 1 View  Result Date: 07/08/2022 CLINICAL DATA:  Altered mental status. EXAM: PORTABLE CHEST 1 VIEW COMPARISON:  06/30/2022. FINDINGS: Cardiac silhouette is normal in size. No mediastinal or hilar masses. Lungs are hyperexpanded. Mild lung base linear/reticular opacities consistent with scarring. Lungs otherwise clear. No pleural effusion or pneumothorax. Skeletal structures are grossly intact. IMPRESSION: No acute cardiopulmonary disease. Electronically Signed   By: Amie Portland M.D.   On: 07/08/2022 15:04    Procedures Procedures    Medications Ordered in ED Medications  albuterol (ACCUNEB) nebulizer solution 0.63 mg (has no administration in time range)  albuterol (VENTOLIN HFA) 108 (90 Base) MCG/ACT inhaler 1-2 puff (has no administration in time range)  artificial tears (LACRILUBE) ophthalmic ointment (has no administration in time range)  fluticasone (FLOVENT HFA) 110 MCG/ACT inhaler 2 puff (has no administration in time range)  furosemide (LASIX) tablet 20 mg (has no administration in time range)  gabapentin (NEURONTIN) capsule 400 mg (has no administration in time range)  HYDROcodone-acetaminophen (NORCO/VICODIN) 5-325 MG per tablet 1 tablet (has no administration in time range)  memantine (NAMENDA) tablet 10 mg (has no administration in time range)  metoprolol succinate (TOPROL-XL) 24 hr tablet 25 mg (has no administration in time range)  polyethylene glycol (MIRALAX / GLYCOLAX) packet 17 g (has no  administration in time range)  senna-docusate (Senokot-S) tablet 1 tablet (has no administration in time range)  umeclidinium-vilanterol (ANORO ELLIPTA) 62.5-25 MCG/ACT 1 puff (has no administration in time range)  cyanocobalamin (VITAMIN B12) tablet 1,000 mcg (has no  administration in time range)    ED Course/ Medical Decision Making/ A&P Clinical Course as of 07/08/22 1831  Sat Jul 08, 2022  1506 Mildly elevated pCO2 but normal pH and elevated bicarb, likely chronic retaining and less likely cause of symptoms. [VK]  1526 Cr without significant change from baseline. Remainder of labs without significant abnormality. UA pending. [VK]  1631 Small leuks with rare bacteria on UA but does have squams making a UTI less likely. Will send urine culture. Otherwise no clear etiology for symptoms. [VK]  1715 Patient's daughter states that she is unable to take care of her mother at home. TOC will be consulted for possible SNF. [VK]  1830 With patient's creatinine clearance, pharmacy recommends decreasing gabapentin dosing to not exceed 600 mg/day, will decrease dose. [VK]    Clinical Course User Index [VK] Rexford Maus, DO                             Medical Decision Making This patient presents to the ED with chief complaint(s) of AMS with pertinent past medical history of dementia, chronic pain, COPD which further complicates the presenting complaint. The complaint involves an extensive differential diagnosis and also carries with it a high risk of complications and morbidity.    The differential diagnosis includes infection, dehydration, electrolyte abnormality, ACS, arrhythmia, polypharmacy, hypercarbia, anemia  Additional history obtained: Additional history obtained from family Records reviewed previous admission documents  ED Course and Reassessment: On patient's arrival to the emergency department she is hemodynamically stable in no acute distress.  She is mildly drowsy but  arousable to verbal stimulation and following commands in all 4 extremities.  Patient's Accu-Chek on arrival was within normal range.  Patient will have workup including EKG, labs, chest x-ray and CT head to evaluate for causes of her mental status change and will be closely reassessed.  Independent labs interpretation:  The following labs were independently interpreted: within normal range/at baseline  Independent visualization of imaging: - I independently visualized the following imaging with scope of interpretation limited to determining acute life threatening conditions related to emergency care: CXR/CTH, which revealed no acute abnormality  Consultation: - Consulted or discussed management/test interpretation w/ external professional: TOC     Amount and/or Complexity of Data Reviewed Labs: ordered. Radiology: ordered.  Risk OTC drugs. Prescription drug management.          Final Clinical Impression(s) / ED Diagnoses Final diagnoses:  Fatigue, unspecified type    Rx / DC Orders ED Discharge Orders     None         Rexford Maus, DO 07/08/22 1729

## 2022-07-08 NOTE — ED Triage Notes (Addendum)
Patient BIB [facility] from home. Discharged on Monday from hospital and has been increasingly getting weaker, combative, lethargic at home. Has dementia. Is taking azithromycin for COPD exacerbation. Today was more altered than normal.

## 2022-07-09 DIAGNOSIS — J9611 Chronic respiratory failure with hypoxia: Secondary | ICD-10-CM | POA: Diagnosis not present

## 2022-07-09 LAB — URINE CULTURE

## 2022-07-09 NOTE — Progress Notes (Signed)
TOC Dementia Note   Patient Details  Name: Megan Peterson Date of Birth: [birth year] 07/09/2022, 10:24 AM   To Whom It May Concern:  Please be advised that the above-named patient has a primary diagnosis of dementia which supersedes any psychiatric diagnosis.   Transition of Care (TOC) CM/SW Contact: Princella Ion, LCSW Phone Number: 07/09/2022, 10:24 AM

## 2022-07-09 NOTE — Progress Notes (Addendum)
Bed offers pending.  Addend @ 1:40 PM Presented current bed offers to pt's son. Son will look them up and call this CSW back.   Addend @ 3:04 PM Pt's son reports he provided his sister with the facilities and she is looking them up. He will let our team know which facility they wish to go with. Notified Christine at [employer] [facility] that offers have been presented and we are awaiting a decision.

## 2022-07-09 NOTE — [place] FL2 (Signed)
[place] MEDICAID FL2 LEVEL OF CARE FORM     IDENTIFICATION  Patient Name: Megan Peterson Birthdate: September 11, 1940 Sex: female Admission Date (Current Location): 07/08/2022  [facility] and [place] Number:  Producer, television/film/video and Address:  [facility],  [address] [place], [place] [address]      Provider Number: [id number]  Attending Physician Name and Address:  Default, Provider, MD  Relative Name and Phone Number:  Alyssa Grove (Daughter) [phone]    Current Level of Care: Hospital Recommended Level of Care: Skilled Nursing Facility Prior Approval Number:    Date Approved/Denied:   PASRR Number: [id number]  Discharge Plan: SNF    Current Diagnoses: Patient Active Problem List   Diagnosis Date Noted   Atypical chest pain 07/01/2022   Vision changes 03/09/2022   Eye irritation 12/14/2021   B12 deficiency 06/29/2021   Dyspnea 03/23/2021   Fecal incontinence 01/09/2021   Medication management 07/29/2019   Joint pain 04/23/2019   Memory loss 10/06/2018   Neck pain 04/02/2018   Rash 11/18/2017   Bursitis 08/03/2017   Chronic back pain 12/19/2016   Health care maintenance 10/18/2016   Hypertension 10/18/2016   TIA (transient ischemic attack) 10/18/2016   Radicular leg pain 07/30/2016   Left knee pain 07/10/2016   Tachycardia 05/01/2016   Chronic respiratory failure with hypoxia (HCC) 04/21/2016   COPD with acute exacerbation (HCC) 04/21/2016   Bronchiectasis (HCC)    Encounter for chronic pain management 01/18/2016   Vitamin D deficiency 02/25/2015   Osteopenia 01/02/2014   Medicare annual wellness visit, subsequent 12/12/2013   Advanced care planning/counseling discussion 12/12/2013   Right carotid bruit 12/12/2013   Generalized anxiety disorder    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   Chest pain 05/16/2007   HLD (hyperlipidemia) 09/26/2006    Orientation RESPIRATION BLADDER Height & Weight     Self  O2  (2L Nasal Cannula (PRN)) Incontinent Weight: 141 lb 1.5 oz (64 kg) Height:  4\' 10"  (147.3 cm)  BEHAVIORAL SYMPTOMS/MOOD NEUROLOGICAL BOWEL NUTRITION STATUS      Incontinent Diet (Regular)  AMBULATORY STATUS COMMUNICATION OF NEEDS Skin   Limited Assist Verbally Normal                       Personal Care Assistance Level of Assistance  Bathing, Feeding, Dressing Bathing Assistance: Limited assistance Feeding assistance: Limited assistance Dressing Assistance: Limited assistance     Functional Limitations Info  Sight, Hearing, Speech Sight Info: Adequate Hearing Info: Adequate Speech Info: Adequate    SPECIAL CARE FACTORS FREQUENCY  PT (By licensed PT), OT (By licensed OT)     PT Frequency: x5/week OT Frequency: x5/week            Contractures Contractures Info: Not present    Additional Factors Info  Code Status, Allergies Code Status Info: Full Allergies Info: Buspar (Buspirone)  Cymbalta (Duloxetine Hcl)  Lexapro (Escitalopram Oxalate)  Meperidine Hcl  Mirtazapine  Morphine And Codeine  Sertraline  Statins           Current Medications (07/09/2022):  This is the current hospital active medication list Current Facility-Administered Medications  Medication Dose Route Frequency Provider Last Rate Last Admin   albuterol (PROVENTIL) (2.5 MG/3ML) 0.083% nebulizer solution 2.5 mg  2.5 mg Nebulization Q6H PRN Elayne Snare K, DO       artificial tears (LACRILUBE) ophthalmic ointment   Both Eyes Q4H PRN Rexford Maus, DO  budesonide (PULMICORT) nebulizer solution 0.5 mg  0.5 mg Nebulization BID Elayne Snare K, DO   0.5 mg at 07/09/22 1610   cyanocobalamin (VITAMIN B12) tablet 1,000 mcg  1,000 mcg Oral Daily Elayne Snare K, DO   1,000 mcg at 07/09/22 9604   furosemide (LASIX) tablet 20 mg  20 mg Oral Daily PRN Elayne Snare K, DO       gabapentin (NEURONTIN) capsule 200 mg  200 mg Oral TID Theresia Lo, Victoria K, DO   200 mg at 07/09/22 0912    HYDROcodone-acetaminophen (NORCO/VICODIN) 5-325 MG per tablet 1 tablet  1 tablet Oral Q6H PRN Elayne Snare K, DO   1 tablet at 07/08/22 1905   memantine (NAMENDA) tablet 10 mg  10 mg Oral BID Elayne Snare K, DO   10 mg at 07/09/22 0913   metoprolol succinate (TOPROL-XL) 24 hr tablet 25 mg  25 mg Oral Daily Elayne Snare K, DO   25 mg at 07/09/22 0913   polyethylene glycol (MIRALAX / GLYCOLAX) packet 17 g  17 g Oral Daily PRN Elayne Snare K, DO       senna-docusate (Senokot-S) tablet 1 tablet  1 tablet Oral QHS Kingsley, Victoria K, DO   1 tablet at 07/08/22 2146   umeclidinium-vilanterol (ANORO ELLIPTA) 62.5-25 MCG/ACT 1 puff  1 puff Inhalation Daily Elayne Snare K, DO   1 puff at 07/09/22 5409   Current Outpatient Medications  Medication Sig Dispense Refill   albuterol (ACCUNEB) 0.63 MG/3ML nebulizer solution Take 3 mLs (0.63 mg total) by nebulization every 6 (six) hours as needed for wheezing. 75 mL 12   albuterol (VENTOLIN HFA) 108 (90 Base) MCG/ACT inhaler INHALE 1 TO 2 PUFFS BY MOUTH EVERY 6 HOURS AS NEEDED FOR WHEEZING FOR SHORTNESS OF BREATH 9 g 0   artificial tears (LACRILUBE) OINT ophthalmic ointment Place into both eyes every 4 (four) hours as needed for dry eyes. 5 g 0   fluticasone (FLOVENT HFA) 110 MCG/ACT inhaler Inhale 2 puffs into the lungs 2 (two) times daily. 1 each 2   furosemide (LASIX) 20 MG tablet TAKE 1 TABLET BY MOUTH ONCE DAILY AS NEEDED IN THE MORNING FOR FLUID 30 tablet 1   gabapentin (NEURONTIN) 400 MG capsule Take 1 capsule (400 mg total) by mouth 3 (three) times daily. 90 capsule 0   HYDROcodone-acetaminophen (NORCO/VICODIN) 5-325 MG tablet Take 1 tablet by mouth every 6 (six) hours as needed for moderate pain. 120 tablet 0   memantine (NAMENDA) 10 MG tablet Take 1 tablet by mouth twice daily 60 tablet 5   metoprolol succinate (TOPROL-XL) 25 MG 24 hr tablet Take 1 tablet (25 mg total) by mouth daily. 90 tablet 1   nitroGLYCERIN  (NITROSTAT) 0.4 MG SL tablet Place 0.4 mg under the tongue every 5 (five) minutes as needed for chest pain (max 3 doses in 15 minutes.  if still having chest pain then go to the ER). 50 tablet 3   nystatin (MYCOSTATIN/NYSTOP) powder Apply 1 Application topically 3 (three) times daily. 30 g 3   OXYGEN Inhale 2 L into the lungs at bedtime.     polyethylene glycol (MIRALAX / GLYCOLAX) 17 g packet Take 17 g by mouth daily as needed for mild constipation. 14 each 0   senna-docusate (SENOKOT-S) 8.6-50 MG tablet Take 1 tablet by mouth at bedtime for 10 days. 10 tablet 0   umeclidinium-vilanterol (ANORO ELLIPTA) 62.5-25 MCG/ACT AEPB Inhale 1 puff by mouth once daily 60 each 5   vitamin B-12 (  CYANOCOBALAMIN) 1000 MCG tablet Take 1 tablet (1,000 mcg total) by mouth daily.       Discharge Medications: Please see discharge summary for a list of discharge medications.  Relevant Imaging Results:  Relevant Lab Results:   Additional Information SSN: [id number]  Princella Ion, LCSW

## 2022-07-09 NOTE — Telephone Encounter (Signed)
I put in the referral for palliative care, if possible through [facility] or [facility].

## 2022-07-09 NOTE — ED Provider Notes (Addendum)
Emergency Medicine Observation Re-evaluation Note  Megan Peterson is a 82 y.o. female, seen on rounds today.  Pt initially presented to the ED for complaints of Altered Mental Status Currently, the patient is awake and alert.  Pt brought in yesterday for weakness and worsening MS.  Her daughter did stay with her last night and said she was hallucinating.  She had some difficulty urinating early this am.  A foley was about to be placed, but she was able to urinate a large amount in her bed.  TOC consulted for placement.  Physical Exam  BP (!) 146/58   Pulse 85   Temp 98.3 F (36.8 C) (Oral)   Resp 12   Ht 4\' 10"  (1.473 m)   Wt 64 kg   SpO2 98%   BMI 29.49 kg/m  Physical Exam General: awake and alert Cardiac: rr Lungs: clear Psych: calm  ED Course / MDM  EKG:EKG Interpretation Date/Time:  Saturday July 08 2022 15:05:52 EDT Ventricular Rate:  81 PR Interval:  151 QRS Duration:  79 QT Interval:  386 QTC Calculation: 448 R Axis:   22  Text Interpretation: Sinus rhythm Low voltage, precordial leads No significant change since last tracing Confirmed by Elayne Snare ([id number]) on 07/08/2022 3:07:38 PM  I have reviewed the labs performed to date as well as medications administered while in observation.  Recent changes in the last 24 hours include medical eval neg.  Plan  Current plan is for awaiting TOC.    Jacalyn Lefevre, MD 07/09/22 [phone]  There is a new family member with patient now.  He is now angry that we did not admit her.  He is also mad that if we could not admit her, that we did not immediately get her into a rehab or SNF.  I tried to explain that the process takes time.  He was not interested in waiting any more, so he will take pt home.    Jacalyn Lefevre, MD 07/09/22 [phone]

## 2022-07-09 NOTE — Discharge Instructions (Addendum)
Megan Peterson was pleasant and cooperative while in the ER and did not display severe behaviors.

## 2022-07-09 NOTE — Progress Notes (Signed)
Transition of Care [facility]) - Emergency Department Mini Assessment   Patient Details  Name: Megan Peterson MRN: [id number] Date of Birth: 1940-08-16  Transition of Care [facility]) CM/SW Contact:    Princella Ion, LCSW Phone Number: 07/09/2022, 10:15 AM   Clinical Narrative: This CSW spoke with pt's daughter, [name] via phone to explain SNF process. Pt's daughter explained that pt has been home with [facility] services through [facility]; however, she and pt were dissatisfied with [facility] sending a female aide to the home. [name] informed she then contacted pt's doctor to request new orders for a different agency. [name] informed she'd recently had two procedures herself and is having a difficult time caring for the pt at home.This CSW informed we would follow for PT's recommendation and pursue SNF if necessary.  This CSW spoke with pt's son at bedside who informed he and his sister were not certain which route to take. This CSW explained at length of our process and potential barriers to placement. Pt's son verbalized understanding. TOC following for PT's recommendation.    ED Mini Assessment: What brought you to the Emergency Department? : weakness  Barriers to Discharge: SNF Pending bed offer, ED SNF auth             Patient Contact and Communications Key Contact 1: [name] (Daughter) Key Contact 2: Son Spoke with: Adult Children [name] and son) Contact Date: 07/09/22,   Contact time: 0930 Contact Phone Number: Alyssa Grove (Daughter) [phone] , son - [phone] Call outcome: Family interested in SNF    CMS Medicare.gov Compare Post Acute Care list provided to:: Patient Represenative (must comment) (Adult Children) Choice offered to / list presented to : Adult Children  Admission diagnosis:  AMS Patient Active Problem List   Diagnosis Date Noted   Atypical chest pain 07/01/2022   Vision changes 03/09/2022   Eye irritation 12/14/2021   B12 deficiency 06/29/2021   Dyspnea 03/23/2021    Fecal incontinence 01/09/2021   Medication management 07/29/2019   Joint pain 04/23/2019   Memory loss 10/06/2018   Neck pain 04/02/2018   Rash 11/18/2017   Bursitis 08/03/2017   Chronic back pain 12/19/2016   Health care maintenance 10/18/2016   Hypertension 10/18/2016   TIA (transient ischemic attack) 10/18/2016   Radicular leg pain 07/30/2016   Left knee pain 07/10/2016   Tachycardia 05/01/2016   Chronic respiratory failure with hypoxia (HCC) 04/21/2016   COPD with acute exacerbation (HCC) 04/21/2016   Bronchiectasis (HCC)    Encounter for chronic pain management 01/18/2016   Vitamin D deficiency 02/25/2015   Osteopenia 01/02/2014   Medicare annual wellness visit, subsequent 12/12/2013   Advanced care planning/counseling discussion 12/12/2013   Right carotid bruit 12/12/2013   Generalized anxiety disorder    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 ([address]), [place] - [address] [place] ([place]) [place] [address] Phone: [phone] Fax: [phone]  [facility] - [place], [place] - [address] [place] [address] Phone: [phone] Fax: [phone]  [id number] - [place], [place] - [address] [place] [address] Phone: [phone] Fax: [phone]

## 2022-07-09 NOTE — Progress Notes (Signed)
30 Day PASRR Note   Patient Details  Name: Megan Peterson Date of Birth: [birth year]   Transition of Care [facility]) CM/SW Contact:    Princella Ion, LCSW Phone Number: 07/09/2022, 10:24 AM  To Whom It May Concern:  Please be advised that this patient will require a short-term nursing home stay - anticipated 30 days or less for rehabilitation and strengthening.   The plan is for return home.

## 2022-07-09 NOTE — Evaluation (Signed)
Physical Therapy Evaluation Patient Details Name: Megan Peterson MRN: [id number] DOB: [birth year] Today's Date: 07/09/2022  History of Present Illness  82 year old female with a past medical history of dementia, COPD on 2 L nasal cannula, chronic pain on chronic narcotics and gabapentin presenting to the emergency department with altered mental status.  Patient was recently discharged from the hospital for COPD exacerbation and was sent home on azithromycin.  Clinical Impression  Pt admitted with above diagnosis.  Pt currently with functional limitations due to the deficits listed below (see PT Problem List). Pt will benefit from acute skilled PT to increase their independence and safety with mobility to allow discharge.  Pt assisted with ambulating however only tolerating short distance.  Pt with dyspnea with activity and SPO2 86% on room air.  Son present and reports pt with inconsistent use of oxygen at home.  Pt also required bil UE support to ambulate back to room.  Pt typically does not use assistive device at baseline.  Pt would benefit from post acute rehab upon d/c.         Assistance Recommended at Discharge Frequent or constant Supervision/Assistance  If plan is discharge home, recommend the following:  Can travel by private vehicle  A little help with walking and/or transfers;Help with stairs or ramp for entrance;Assistance with cooking/housework;Direct supervision/assist for medications management;A little help with bathing/dressing/bathroom   Yes    Equipment Recommendations None recommended by PT  Recommendations for Other Services       Functional Status Assessment Patient has had a recent decline in their functional status and demonstrates the ability to make significant improvements in function in a reasonable and predictable amount of time.     Precautions / Restrictions Precautions Precautions: Fall Precaution Comments: chronic O2      Mobility  Bed  Mobility Overal bed mobility: Needs Assistance Bed Mobility: Supine to Sit     Supine to sit: Min assist     General bed mobility comments: assist from ED stretcher    Transfers Overall transfer level: Needs assistance Equipment used: 1 person hand held assist Transfers: Sit to/from Stand Sit to Stand: Min assist           General transfer comment: light assist to rise and stabilize, cues for hand placement, requiring UE self assist    Ambulation/Gait Ambulation/Gait assistance: Min assist Gait Distance (Feet): 60 Feet Assistive device: None, 2 person hand held assist Gait Pattern/deviations: Step-through pattern, Decreased stride length Gait velocity: decr     General Gait Details: able to ambulate first 30 feet without assistive device however reports mod dyspnea, SpO2 86% on room air, returned to room with 2 HHA which pt required for stability and endurance; reapplied 2L O2 [place] once returned to room and SpO2 improved to 94%; son present and reports pt with inconsistent use of oxygen at home however supposed to be using it  Careers information officer     Tilt Bed    Modified Rankin (Stroke Patients Only)       Balance Overall balance assessment: Needs assistance         Standing balance support: No upper extremity supported, During functional activity Standing balance-Leahy Scale: Fair                               Pertinent Vitals/Pain Pain Assessment Pain Assessment: Faces Faces Pain Scale: No  hurt Pain Intervention(s): Monitored during session, Repositioned    Home Living Family/patient expects to be discharged to:: Private residence Living Arrangements: Children (daughter) Available Help at Discharge: Family;Available 24 hours/day Type of Home: House Home Access: Stairs to enter Entrance Stairs-Rails: None Entrance Stairs-Number of Steps: 1   Home Layout: One level Home Equipment: BSC/3in1;Rolling Walker (2  wheels) Additional Comments: O2    Prior Function Prior Level of Function : Needs assist  Cognitive Assist : Mobility (cognitive);ADLs (cognitive) Mobility (Cognitive): Intermittent cues ADLs (Cognitive): Intermittent cues Physical Assist : Mobility (physical);ADLs (physical) Mobility (physical): Bed mobility;Transfers;Gait ADLs (physical): Grooming;Bathing;Dressing;Toileting;IADLs Mobility Comments: Intermittent depending on the day. Only amb household distances without assistive device ADLs Comments: Intermittent depending on the day     Hand Dominance   Dominant Hand: Right    Extremity/Trunk Assessment        Lower Extremity Assessment Lower Extremity Assessment: Generalized weakness    Cervical / Trunk Assessment Cervical / Trunk Assessment: Kyphotic  Communication   Communication: No difficulties  Cognition Arousal/Alertness: Awake/alert Behavior During Therapy: WFL for tasks assessed/performed Overall Cognitive Status: History of cognitive impairments - at baseline                                 General Comments: Pleasant and cooperative, follows simple 1-step commands with increased time; son present and confirming info from admission 1 week ago        General Comments      Exercises     Assessment/Plan    PT Assessment Patient needs continued PT services  PT Problem List Decreased strength;Decreased activity tolerance;Decreased balance;Decreased knowledge of use of DME;Decreased mobility;Decreased safety awareness;Decreased cognition       PT Treatment Interventions DME instruction;Gait training;Balance training;Therapeutic exercise;Functional mobility training;Therapeutic activities;Patient/family education    PT Goals (Current goals can be found in the Care Plan section)  Acute Rehab PT Goals PT Goal Formulation: With patient Time For Goal Achievement: 07/23/22 Potential to Achieve Goals: Good    Frequency Min 1X/week      Co-evaluation               AM-PAC PT "6 Clicks" Mobility  Outcome Measure Help needed turning from your back to your side while in a flat bed without using bedrails?: A Little Help needed moving from lying on your back to sitting on the side of a flat bed without using bedrails?: A Little Help needed moving to and from a bed to a chair (including a wheelchair)?: A Little Help needed standing up from a chair using your arms (e.g., wheelchair or bedside chair)?: A Little Help needed to walk in hospital room?: A Little Help needed climbing 3-5 steps with a railing? : A Lot 6 Click Score: 17    End of Session Equipment Utilized During Treatment: Oxygen;Gait belt Activity Tolerance: Patient tolerated treatment well Patient left: in chair;with call bell/phone within reach;with family/visitor present   PT Visit Diagnosis: Difficulty in walking, not elsewhere classified (R26.2);Unsteadiness on feet (R26.81)    Time: 0950-1005 PT Time Calculation (min) (ACUTE ONLY): 15 min   Charges:   PT Evaluation $PT Eval Low Complexity: 1 Low   PT General Charges $$ ACUTE PT VISIT: 1 Visit       Thomasene Mohair PT, DPT Physical Therapist Acute Rehabilitation Services Office: [phone]  Janan Halter P[place] 07/09/2022, 10:49 AM

## 2022-07-10 ENCOUNTER — Encounter: Payer: Self-pay | Admitting: *Deleted

## 2022-07-10 ENCOUNTER — Telehealth: Payer: Self-pay

## 2022-07-10 LAB — URINE CULTURE: Culture: >=100000 — AB

## 2022-07-10 NOTE — Progress Notes (Signed)
Family selected [facility]. Notified Christine. Auth initiated.

## 2022-07-10 NOTE — ED Notes (Addendum)
Pt. family member daughter is refusing to leave after the 30 minute Unit  Guidelines. The daughter has been given Unit Guidelines sheet and she still refuse to leave the unit so, the security and [facility] was called at the bedside.

## 2022-07-10 NOTE — ED Notes (Signed)
Pt daughter express  to Clinical research associate that the pt has not had a bath in three days and her nails need cleaning so Clinical research associate gave pt a bed bath, nails hygiene and change the bed linens also offered pt something drink but pt refuse it.

## 2022-07-10 NOTE — Telephone Encounter (Signed)
What is her status re: placement at this point?  Is that in process?  Please check with her daughter.

## 2022-07-10 NOTE — Progress Notes (Signed)
CSW received a request to speak with patients daughter [name]. [name] stated she didn't agree for patient to discharge to [facility]. [name] stated she would like other options. [name] is requesting other facilities to be contacted for placement. CSW provided [name] with the list of facilities that accepted patient and the list of referrals that were sent. [name] is requesting all medical records. CSW advised [name] to go through medical records.

## 2022-07-10 NOTE — Telephone Encounter (Signed)
I think that initial referral predates her ER eval.  Is she planning on placement now?

## 2022-07-10 NOTE — Telephone Encounter (Signed)
Thank you for checking on this.  I didn't realized she was still in the ED.  I thought she had been discharged back to home, based on the note that I had seen from ER.

## 2022-07-10 NOTE — ED Notes (Signed)
Rounded on pt, pt had no incontinence noted to brief, pt currently resting, NAD noted.

## 2022-07-10 NOTE — ED Provider Notes (Signed)
Emergency Medicine Observation Re-evaluation Note  Megan Peterson is a 82 y.o. female, seen on rounds today.  Pt initially presented to the ED for complaints of Altered Mental Status Currently, the patient is awaiting placement.  Physical Exam  BP (!) 129/45 (BP Location: Left Arm)   Pulse 77   Temp 98.1 F (36.7 C) (Oral)   Resp 16   Ht 4\' 10"  (1.473 m)   Wt 64 kg   SpO2 90%   BMI 29.49 kg/m  Physical Exam General: Calm Cardiac: Well perfused Lungs: Even respirations Psych: Calm  ED Course / MDM  EKG:EKG Interpretation Date/Time:  Saturday July 08 2022 15:05:52 EDT Ventricular Rate:  81 PR Interval:  151 QRS Duration:  79 QT Interval:  386 QTC Calculation: 448 R Axis:   22  Text Interpretation: Sinus rhythm Low voltage, precordial leads No significant change since last tracing Confirmed by Elayne Snare ([id number]) on 07/08/2022 3:07:38 PM  I have reviewed the labs performed to date as well as medications administered while in observation.  Recent changes in the last 24 hours include family to review bed offers.  Plan  Current plan is for SNF placement.    Maia Plan, MD 07/11/22 [phone]

## 2022-07-10 NOTE — ED Notes (Signed)
RN to room to introduce self to pt nd family, pharmacy in room at this time, getting medication regimen. Pt now resting on bed, denies any needs at this time

## 2022-07-10 NOTE — Telephone Encounter (Signed)
Per chart review tab pt was seen [facility] on 07/08/22.sending note to Dr Para March and Para March pool.

## 2022-07-10 NOTE — Telephone Encounter (Signed)
Unable to reach patients daughter.   Megan Peterson has placed that referral through to [facility]. See message patient received below.

## 2022-07-10 NOTE — Telephone Encounter (Signed)
Called and spoke with patients daughter. She was verbally upset that her mother is still in the ED, they will not admit her or place her in a room. When asked about intentions on placement, patients daughter stated she does not have an answer for that right now.  Patients daughter expressed her aggravation with the ED staff and hung up the phone.

## 2022-07-11 MED ORDER — AMOXICILLIN 500 MG PO CAPS
500.0000 mg | ORAL_CAPSULE | Freq: Two times a day (BID) | ORAL | Status: DC
  Administered 2022-07-11 – 2022-07-12 (×3): 500 mg via ORAL
  Filled 2022-07-11 (×3): qty 1

## 2022-07-11 NOTE — TOC Progression Note (Signed)
Transition of Care [facility]) - Progression Note    Patient Details  Name: Megan Peterson MRN: [id number] Date of Birth: [birth year]  Transition of Care [facility]) CM/SW Contact  Carmina Miller, [place] Phone Number: 07/11/2022, 6:33 PM  Clinical Narrative:     CSW spoke with pt's niece Cala Bradford in reference to SNF or HH choice, Cala Bradford states her aunt spoke with someone today that advised [facility] is in network with [facility] and requested a referral be sent. CSW spoke with Cala Bradford about possible HH option if [facility] is not able to offer on pt, she states they have used [facility] in the past and would be agreeable to using them again. CSW advised it may be tomorrow before we hear from [facility] as it is afterhours, Cala Bradford verbalized understanding. Cala Bradford expressed frustration over how pt's behaviors have been portrayed in the chart and whether or not that has a bearing on pt being offered a bed. CSW explained pt's insurance provides for a smaller group of SNFs which can be frustrating when looking for placement. Referral sent to [facility]. TOC will continue to follow.     Barriers to Discharge: SNF Pending bed offer, ED SNF auth  Expected Discharge Plan and Services                                               Social Determinants of Health (SDOH) Interventions SDOH Screenings   Food Insecurity: No Food Insecurity (05/30/2022)  Housing: Low Risk  (05/30/2022)  Transportation Needs: No Transportation Needs (05/30/2022)  Depression (PHQ2-9): Medium Risk (06/16/2022)  Financial Resource Strain: Low Risk  (01/13/2022)  Physical Activity: Inactive (06/16/2021)  Stress: No Stress Concern Present (06/16/2021)  Tobacco Use: Medium Risk (07/08/2022)    Readmission Risk Interventions     No data to display

## 2022-07-11 NOTE — ED Notes (Signed)
Patient refusing to eat all day despite ordering food that she typically likes. Advised that she needs to eat for nourishment but ongoing refusal. Did take meds whole.

## 2022-07-11 NOTE — Progress Notes (Addendum)
Attempted to contact pt's son to inquire about discharge plan. Unable to leave vm.   Addend @ 10:56 AM This CSW received a call from pt's daughter, [name], who is requesting pt's medical records. This CSW reiterated to [name] she would need to request records via Medical Records. [name] requested that this CSW contact pt's niece, Docia Furl [phone]). This CSW had an extensive conversation with Cala Bradford explaining our process and all that has been communicated with pt's children. Cala Bradford requested that [facility] and [facility] be contacted for possible placement. [facility] is reviewing the pt at this time. TOC following.

## 2022-07-11 NOTE — Telephone Encounter (Signed)
Patient daughter called in and stated that her mom is still at the hospital. She stated that the social worker is needing a referral to place her somewhere they don't want her to go to. She stated that she would like the referral to be sent over to [facility] and it is covered by her insurance. Thank you!

## 2022-07-11 NOTE — Progress Notes (Addendum)
This CSW outreached to pt's daughter without success. This CSW then contacted pt's son, Megan Peterson [phone]) who stated he "was under the impression that we were just seeing what our options were." This CSW reiterated everything that was explained on 07/09/22 at bedside (snf process, HH vs SNF, PT's recommendation, insurance authorization, what happens if there are no bed offers) and he'd verbalized understanding. This CSW reminded Megan Peterson that she'd spoken with his sister before him on Sunday, 07/09/22 and informed pt's son that the same information was provided to [facility] via phone on Sunday and she became frustrated and disconnected the call. Megan Peterson continued to state they are not interested in putting the pt anywhere at this time and this CSW informed that the only option is now for the pt to return home with [facility]. Megan Peterson verbalized frustration with pt being in TCU and unit guidelines. This CSW reminded Megan Peterson that he was informed on Sunday, 07/09/22 that pt may transition out of current room and into a transitional care unit which is where patients are until they discharge to their proper placement. Megan Peterson stated he will speak with his sister and give this CSW a call back. At this time, pt still only has two bed offers: [facility] and [facility] which family selected Megan Peterson on Sunday, 07/09/22 per Megan Peterson via phone call at 3:52 PM. TOC following.

## 2022-07-11 NOTE — ED Provider Notes (Addendum)
Emergency Medicine Observation Re-evaluation Note  Megan Peterson is a 82 y.o. female, seen on rounds today.  Pt initially presented to the ED for complaints of Altered Mental Status Currently, the patient is sitting in bed.  Physical Exam  BP 114/85 (BP Location: Right Arm)   Pulse 80   Temp 98.3 F (36.8 C) (Oral)   Resp 20   Ht 4\' 10"  (1.473 m)   Wt 64 kg   SpO2 96%   BMI 29.49 kg/m  Physical Exam General: No acute distress Cardiac: Normal rate Lungs: No increased work of breathing Psych: Calm  ED Course / MDM  EKG:EKG Interpretation Date/Time:  Saturday July 08 2022 15:05:52 EDT Ventricular Rate:  81 PR Interval:  151 QRS Duration:  79 QT Interval:  386 QTC Calculation: 448 R Axis:   22  Text Interpretation: Sinus rhythm Low voltage, precordial leads No significant change since last tracing Confirmed by Elayne Snare ([id number]) on 07/08/2022 3:07:38 PM  I have reviewed the labs performed to date as well as medications administered while in observation.  Recent changes in the last 24 hours include none.  Plan  Current plan is for awaiting placement by TOC.  Urine culture did grow out greater than 100,000 colonies of Aerococcus urinae.  I discussed this with Dr. Zenaida Niece dam with ID who recommends 3 days of amoxicillin.  Will start this given that the patient has had some urinary symptoms.    Rolan Bucco, MD 07/11/22 [id number]    Rolan Bucco, MD 07/11/22 1339

## 2022-07-12 MED ORDER — AMOXICILLIN 500 MG PO CAPS: 500.0000 mg | ORAL_CAPSULE | Freq: Two times a day (BID) | ORAL | 0 refills | Status: DC

## 2022-07-12 NOTE — ED Notes (Signed)
Pt c/o buttock pain on examination the skin blanched WNL, skin color is WNL but skin is intact no drainage or redness noted. Mepilex border sacrum pad applied

## 2022-07-12 NOTE — ED Provider Notes (Addendum)
  Physical Exam  BP (!) 118/58 (BP Location: Right Arm)   Pulse 71   Temp 97.9 F (36.6 C) (Oral)   Resp 18   Ht 4\' 10"  (1.473 m)   Wt 64 kg   SpO2 93%   BMI 29.49 kg/m   Physical Exam  Procedures  Procedures  ED Course / MDM   Clinical Course as of 07/12/22 0901  Sat Jul 08, 2022  1506 Mildly elevated pCO2 but normal pH and elevated bicarb, likely chronic retaining and less likely cause of symptoms. [VK]  1526 Cr without significant change from baseline. Remainder of labs without significant abnormality. UA pending. [VK]  1631 Small leuks with rare bacteria on UA but does have squams making a UTI less likely. Will send urine culture. Otherwise no clear etiology for symptoms. [VK]  1715 Patient's daughter states that she is unable to take care of her mother at home. TOC will be consulted for possible SNF. [VK]  1830 With patient's creatinine clearance, pharmacy recommends decreasing gabapentin dosing to not exceed 600 mg/day, will decrease dose. [VK]    Clinical Course User Index [VK] Rexford Maus, DO   Medical Decision Making Amount and/or Complexity of Data Reviewed Labs: ordered. Radiology: ordered.  Risk OTC drugs. Prescription drug management.   Patient with potential placement.  Unable to be taken care of at home.  Found to have UTI he will have 3 days of antibiotics.  Pending potential [facility] placement.  If not possible home health.  Sleeping comfortably at this time.       Benjiman Core, MD 07/12/22 [phone]  Patient cleared for discharge.  Home health.  Given antibiotics.  Family requested statement that she was well-behaved while in the ER.  Home health face-to-face orders placed.    Benjiman Core, MD 07/12/22 1148

## 2022-07-12 NOTE — Telephone Encounter (Signed)
I am asking for input from inpatient team and I routed this in the meantime.  I didn't put in the order yet.  I thank all involved.

## 2022-07-12 NOTE — Progress Notes (Signed)
This CSW followed up with [facility] and was informed their facility is full. This CSW updated Docia Furl who will inform pt's daughter and let me know of next steps.

## 2022-07-12 NOTE — Progress Notes (Addendum)
Pt's auth approved for [employer]. This CSW received a call from Docia Furl (Niece) who requested this CSW follow up with [facility] regarding a bed. This CSW spoke with [facility] and they are unable to offer at this time. This CSW informed Cala Bradford and [name]. [name] is choosing to not move forward with [facility] and return the pt home with home health. Family requested [facility] and this CSW outreached to rep, Morrie Sheldon, and was informed [facility] is out of network. Family selected [facility]. Home Health PT/OT/RN/SW is set up with Elnita Maxwell, rep for [facility].   [name] is cleaning the pt's bedroom and bathroom and will be informing the team of a pick up time later. EDP and RN notified via secure chat.   Addend @ 1:05 PM Attempted to contact [name] to inquire about pickup time. Unable to leave voicemail.

## 2022-07-14 ENCOUNTER — Telehealth: Payer: Self-pay | Admitting: Family Medicine

## 2022-07-14 DIAGNOSIS — Z7982 Long term (current) use of aspirin: Secondary | ICD-10-CM | POA: Diagnosis not present

## 2022-07-14 DIAGNOSIS — Z7951 Long term (current) use of inhaled steroids: Secondary | ICD-10-CM | POA: Diagnosis not present

## 2022-07-14 DIAGNOSIS — Z8673 Personal history of transient ischemic attack (TIA), and cerebral infarction without residual deficits: Secondary | ICD-10-CM | POA: Diagnosis not present

## 2022-07-14 DIAGNOSIS — G629 Polyneuropathy, unspecified: Secondary | ICD-10-CM | POA: Diagnosis not present

## 2022-07-14 DIAGNOSIS — M545 Low back pain, unspecified: Secondary | ICD-10-CM | POA: Diagnosis not present

## 2022-07-14 DIAGNOSIS — F02B0 Dementia in other diseases classified elsewhere, moderate, without behavioral disturbance, psychotic disturbance, mood disturbance, and anxiety: Secondary | ICD-10-CM | POA: Diagnosis not present

## 2022-07-14 DIAGNOSIS — E785 Hyperlipidemia, unspecified: Secondary | ICD-10-CM | POA: Diagnosis not present

## 2022-07-14 DIAGNOSIS — M6289 Other specified disorders of muscle: Secondary | ICD-10-CM | POA: Diagnosis not present

## 2022-07-14 DIAGNOSIS — Z556 Problems related to health literacy: Secondary | ICD-10-CM | POA: Diagnosis not present

## 2022-07-14 DIAGNOSIS — G8929 Other chronic pain: Secondary | ICD-10-CM | POA: Diagnosis not present

## 2022-07-14 DIAGNOSIS — J9611 Chronic respiratory failure with hypoxia: Secondary | ICD-10-CM | POA: Diagnosis not present

## 2022-07-14 DIAGNOSIS — E538 Deficiency of other specified B group vitamins: Secondary | ICD-10-CM | POA: Diagnosis not present

## 2022-07-14 DIAGNOSIS — J441 Chronic obstructive pulmonary disease with (acute) exacerbation: Secondary | ICD-10-CM | POA: Diagnosis not present

## 2022-07-14 DIAGNOSIS — M8580 Other specified disorders of bone density and structure, unspecified site: Secondary | ICD-10-CM | POA: Diagnosis not present

## 2022-07-14 DIAGNOSIS — I200 Unstable angina: Secondary | ICD-10-CM | POA: Diagnosis not present

## 2022-07-14 DIAGNOSIS — I10 Essential (primary) hypertension: Secondary | ICD-10-CM | POA: Diagnosis not present

## 2022-07-14 DIAGNOSIS — Z9181 History of falling: Secondary | ICD-10-CM | POA: Diagnosis not present

## 2022-07-14 NOTE — Telephone Encounter (Signed)
Home Health verbal orders Caller Name: Kreg Shropshire  Agency Name: [facility]   Callback number: [phone], secured   Requesting OT/PT/Skilled nursing/Social Work/Speech: PT, OT eval, social work eval   Reason:  Frequency: PT - once a week for one week, twice a week for four weeks, once a week for four weeks. Will call back with OT & social work frequencies.    Please forward to [facility] pool or providers CMA

## 2022-07-14 NOTE — Telephone Encounter (Signed)
Please give the order.  Thanks.   

## 2022-07-14 NOTE — Telephone Encounter (Signed)
Dr Para March, what would you like for me to do with the open Palliative Care order?   Looks like D/C plans from hospital have the patient set up with [facility]   [facility] is contacting the office requesting services for OT/PT/Skilled nursing/Social Work/Speech: PT, OT eval, social work eval   (See telephone encounter from 07/14/22)

## 2022-07-14 NOTE — Telephone Encounter (Signed)
Verbal orders left on secure VM

## 2022-07-18 DIAGNOSIS — G8929 Other chronic pain: Secondary | ICD-10-CM | POA: Diagnosis not present

## 2022-07-18 DIAGNOSIS — E538 Deficiency of other specified B group vitamins: Secondary | ICD-10-CM | POA: Diagnosis not present

## 2022-07-18 DIAGNOSIS — G629 Polyneuropathy, unspecified: Secondary | ICD-10-CM | POA: Diagnosis not present

## 2022-07-18 DIAGNOSIS — E785 Hyperlipidemia, unspecified: Secondary | ICD-10-CM | POA: Diagnosis not present

## 2022-07-18 DIAGNOSIS — J9611 Chronic respiratory failure with hypoxia: Secondary | ICD-10-CM | POA: Diagnosis not present

## 2022-07-18 DIAGNOSIS — F02B0 Dementia in other diseases classified elsewhere, moderate, without behavioral disturbance, psychotic disturbance, mood disturbance, and anxiety: Secondary | ICD-10-CM | POA: Diagnosis not present

## 2022-07-18 DIAGNOSIS — Z556 Problems related to health literacy: Secondary | ICD-10-CM | POA: Diagnosis not present

## 2022-07-18 DIAGNOSIS — M8580 Other specified disorders of bone density and structure, unspecified site: Secondary | ICD-10-CM | POA: Diagnosis not present

## 2022-07-18 DIAGNOSIS — Z8673 Personal history of transient ischemic attack (TIA), and cerebral infarction without residual deficits: Secondary | ICD-10-CM | POA: Diagnosis not present

## 2022-07-18 DIAGNOSIS — I200 Unstable angina: Secondary | ICD-10-CM | POA: Diagnosis not present

## 2022-07-18 DIAGNOSIS — M545 Low back pain, unspecified: Secondary | ICD-10-CM | POA: Diagnosis not present

## 2022-07-18 DIAGNOSIS — Z9181 History of falling: Secondary | ICD-10-CM | POA: Diagnosis not present

## 2022-07-18 DIAGNOSIS — J441 Chronic obstructive pulmonary disease with (acute) exacerbation: Secondary | ICD-10-CM | POA: Diagnosis not present

## 2022-07-18 DIAGNOSIS — I10 Essential (primary) hypertension: Secondary | ICD-10-CM | POA: Diagnosis not present

## 2022-07-18 DIAGNOSIS — Z7982 Long term (current) use of aspirin: Secondary | ICD-10-CM | POA: Diagnosis not present

## 2022-07-18 DIAGNOSIS — Z7951 Long term (current) use of inhaled steroids: Secondary | ICD-10-CM | POA: Diagnosis not present

## 2022-07-19 DIAGNOSIS — Z9181 History of falling: Secondary | ICD-10-CM | POA: Diagnosis not present

## 2022-07-19 DIAGNOSIS — Z7951 Long term (current) use of inhaled steroids: Secondary | ICD-10-CM | POA: Diagnosis not present

## 2022-07-19 DIAGNOSIS — J9611 Chronic respiratory failure with hypoxia: Secondary | ICD-10-CM | POA: Diagnosis not present

## 2022-07-19 DIAGNOSIS — Z8673 Personal history of transient ischemic attack (TIA), and cerebral infarction without residual deficits: Secondary | ICD-10-CM | POA: Diagnosis not present

## 2022-07-19 DIAGNOSIS — G629 Polyneuropathy, unspecified: Secondary | ICD-10-CM | POA: Diagnosis not present

## 2022-07-19 DIAGNOSIS — G8929 Other chronic pain: Secondary | ICD-10-CM | POA: Diagnosis not present

## 2022-07-19 DIAGNOSIS — J441 Chronic obstructive pulmonary disease with (acute) exacerbation: Secondary | ICD-10-CM | POA: Diagnosis not present

## 2022-07-19 DIAGNOSIS — F02B0 Dementia in other diseases classified elsewhere, moderate, without behavioral disturbance, psychotic disturbance, mood disturbance, and anxiety: Secondary | ICD-10-CM | POA: Diagnosis not present

## 2022-07-19 DIAGNOSIS — Z556 Problems related to health literacy: Secondary | ICD-10-CM | POA: Diagnosis not present

## 2022-07-19 DIAGNOSIS — I200 Unstable angina: Secondary | ICD-10-CM | POA: Diagnosis not present

## 2022-07-19 DIAGNOSIS — E785 Hyperlipidemia, unspecified: Secondary | ICD-10-CM | POA: Diagnosis not present

## 2022-07-19 DIAGNOSIS — M545 Low back pain, unspecified: Secondary | ICD-10-CM | POA: Diagnosis not present

## 2022-07-19 DIAGNOSIS — Z7982 Long term (current) use of aspirin: Secondary | ICD-10-CM | POA: Diagnosis not present

## 2022-07-19 DIAGNOSIS — M8580 Other specified disorders of bone density and structure, unspecified site: Secondary | ICD-10-CM | POA: Diagnosis not present

## 2022-07-19 DIAGNOSIS — I10 Essential (primary) hypertension: Secondary | ICD-10-CM | POA: Diagnosis not present

## 2022-07-19 DIAGNOSIS — E538 Deficiency of other specified B group vitamins: Secondary | ICD-10-CM | POA: Diagnosis not present

## 2022-07-20 DIAGNOSIS — I200 Unstable angina: Secondary | ICD-10-CM | POA: Diagnosis not present

## 2022-07-20 DIAGNOSIS — E785 Hyperlipidemia, unspecified: Secondary | ICD-10-CM | POA: Diagnosis not present

## 2022-07-20 DIAGNOSIS — Z556 Problems related to health literacy: Secondary | ICD-10-CM | POA: Diagnosis not present

## 2022-07-20 DIAGNOSIS — Z8673 Personal history of transient ischemic attack (TIA), and cerebral infarction without residual deficits: Secondary | ICD-10-CM | POA: Diagnosis not present

## 2022-07-20 DIAGNOSIS — I10 Essential (primary) hypertension: Secondary | ICD-10-CM | POA: Diagnosis not present

## 2022-07-20 DIAGNOSIS — Z7951 Long term (current) use of inhaled steroids: Secondary | ICD-10-CM | POA: Diagnosis not present

## 2022-07-20 DIAGNOSIS — J441 Chronic obstructive pulmonary disease with (acute) exacerbation: Secondary | ICD-10-CM | POA: Diagnosis not present

## 2022-07-20 DIAGNOSIS — M545 Low back pain, unspecified: Secondary | ICD-10-CM | POA: Diagnosis not present

## 2022-07-20 DIAGNOSIS — G629 Polyneuropathy, unspecified: Secondary | ICD-10-CM | POA: Diagnosis not present

## 2022-07-20 DIAGNOSIS — J9611 Chronic respiratory failure with hypoxia: Secondary | ICD-10-CM | POA: Diagnosis not present

## 2022-07-20 DIAGNOSIS — F02B0 Dementia in other diseases classified elsewhere, moderate, without behavioral disturbance, psychotic disturbance, mood disturbance, and anxiety: Secondary | ICD-10-CM | POA: Diagnosis not present

## 2022-07-20 DIAGNOSIS — Z7982 Long term (current) use of aspirin: Secondary | ICD-10-CM | POA: Diagnosis not present

## 2022-07-20 DIAGNOSIS — M8580 Other specified disorders of bone density and structure, unspecified site: Secondary | ICD-10-CM | POA: Diagnosis not present

## 2022-07-20 DIAGNOSIS — G8929 Other chronic pain: Secondary | ICD-10-CM | POA: Diagnosis not present

## 2022-07-20 DIAGNOSIS — Z9181 History of falling: Secondary | ICD-10-CM | POA: Diagnosis not present

## 2022-07-20 DIAGNOSIS — E538 Deficiency of other specified B group vitamins: Secondary | ICD-10-CM | POA: Diagnosis not present

## 2022-07-22 DIAGNOSIS — J432 Centrilobular emphysema: Secondary | ICD-10-CM | POA: Diagnosis not present

## 2022-07-22 DIAGNOSIS — J9611 Chronic respiratory failure with hypoxia: Secondary | ICD-10-CM | POA: Diagnosis not present

## 2022-07-24 ENCOUNTER — Telehealth: Payer: Self-pay | Admitting: Family Medicine

## 2022-07-24 DIAGNOSIS — I10 Essential (primary) hypertension: Secondary | ICD-10-CM | POA: Diagnosis not present

## 2022-07-24 DIAGNOSIS — E785 Hyperlipidemia, unspecified: Secondary | ICD-10-CM | POA: Diagnosis not present

## 2022-07-24 DIAGNOSIS — Z8673 Personal history of transient ischemic attack (TIA), and cerebral infarction without residual deficits: Secondary | ICD-10-CM | POA: Diagnosis not present

## 2022-07-24 DIAGNOSIS — M545 Low back pain, unspecified: Secondary | ICD-10-CM | POA: Diagnosis not present

## 2022-07-24 DIAGNOSIS — J9611 Chronic respiratory failure with hypoxia: Secondary | ICD-10-CM | POA: Diagnosis not present

## 2022-07-24 DIAGNOSIS — Z9181 History of falling: Secondary | ICD-10-CM

## 2022-07-24 DIAGNOSIS — E538 Deficiency of other specified B group vitamins: Secondary | ICD-10-CM | POA: Diagnosis not present

## 2022-07-24 DIAGNOSIS — Z556 Problems related to health literacy: Secondary | ICD-10-CM

## 2022-07-24 DIAGNOSIS — M8580 Other specified disorders of bone density and structure, unspecified site: Secondary | ICD-10-CM | POA: Diagnosis not present

## 2022-07-24 DIAGNOSIS — G8929 Other chronic pain: Secondary | ICD-10-CM | POA: Diagnosis not present

## 2022-07-24 DIAGNOSIS — I200 Unstable angina: Secondary | ICD-10-CM | POA: Diagnosis not present

## 2022-07-24 DIAGNOSIS — J441 Chronic obstructive pulmonary disease with (acute) exacerbation: Secondary | ICD-10-CM | POA: Diagnosis not present

## 2022-07-24 DIAGNOSIS — Z7982 Long term (current) use of aspirin: Secondary | ICD-10-CM

## 2022-07-24 DIAGNOSIS — G629 Polyneuropathy, unspecified: Secondary | ICD-10-CM | POA: Diagnosis not present

## 2022-07-24 DIAGNOSIS — Z7951 Long term (current) use of inhaled steroids: Secondary | ICD-10-CM

## 2022-07-24 DIAGNOSIS — F0280 Dementia in other diseases classified elsewhere without behavioral disturbance: Secondary | ICD-10-CM | POA: Diagnosis not present

## 2022-07-24 NOTE — Telephone Encounter (Signed)
Home Health verbal orders Caller Name: Domingo Pulse Agency Name: [facility]  Callback number: [phone], secured  Requesting OT/PT/Skilled nursing/Social Work/Speech: OT, HH aid   Reason:  Frequency: once a week for 7 weeks (both)  Please forward to [facility] pool or providers CMA

## 2022-07-25 DIAGNOSIS — F02B0 Dementia in other diseases classified elsewhere, moderate, without behavioral disturbance, psychotic disturbance, mood disturbance, and anxiety: Secondary | ICD-10-CM | POA: Diagnosis not present

## 2022-07-25 DIAGNOSIS — G8929 Other chronic pain: Secondary | ICD-10-CM | POA: Diagnosis not present

## 2022-07-25 DIAGNOSIS — Z7951 Long term (current) use of inhaled steroids: Secondary | ICD-10-CM | POA: Diagnosis not present

## 2022-07-25 DIAGNOSIS — J441 Chronic obstructive pulmonary disease with (acute) exacerbation: Secondary | ICD-10-CM | POA: Diagnosis not present

## 2022-07-25 DIAGNOSIS — I200 Unstable angina: Secondary | ICD-10-CM | POA: Diagnosis not present

## 2022-07-25 DIAGNOSIS — Z7982 Long term (current) use of aspirin: Secondary | ICD-10-CM | POA: Diagnosis not present

## 2022-07-25 DIAGNOSIS — Z8673 Personal history of transient ischemic attack (TIA), and cerebral infarction without residual deficits: Secondary | ICD-10-CM | POA: Diagnosis not present

## 2022-07-25 DIAGNOSIS — J9611 Chronic respiratory failure with hypoxia: Secondary | ICD-10-CM | POA: Diagnosis not present

## 2022-07-25 DIAGNOSIS — Z556 Problems related to health literacy: Secondary | ICD-10-CM | POA: Diagnosis not present

## 2022-07-25 DIAGNOSIS — E785 Hyperlipidemia, unspecified: Secondary | ICD-10-CM | POA: Diagnosis not present

## 2022-07-25 DIAGNOSIS — Z9181 History of falling: Secondary | ICD-10-CM | POA: Diagnosis not present

## 2022-07-25 DIAGNOSIS — M545 Low back pain, unspecified: Secondary | ICD-10-CM | POA: Diagnosis not present

## 2022-07-25 DIAGNOSIS — E538 Deficiency of other specified B group vitamins: Secondary | ICD-10-CM | POA: Diagnosis not present

## 2022-07-25 DIAGNOSIS — G629 Polyneuropathy, unspecified: Secondary | ICD-10-CM | POA: Diagnosis not present

## 2022-07-25 DIAGNOSIS — M8580 Other specified disorders of bone density and structure, unspecified site: Secondary | ICD-10-CM | POA: Diagnosis not present

## 2022-07-25 DIAGNOSIS — I10 Essential (primary) hypertension: Secondary | ICD-10-CM | POA: Diagnosis not present

## 2022-07-25 NOTE — Telephone Encounter (Signed)
Please give the order.  Thanks.   

## 2022-07-25 NOTE — Telephone Encounter (Signed)
Verbal orders given to [facility]

## 2022-07-27 DIAGNOSIS — Z7982 Long term (current) use of aspirin: Secondary | ICD-10-CM | POA: Diagnosis not present

## 2022-07-27 DIAGNOSIS — G8929 Other chronic pain: Secondary | ICD-10-CM | POA: Diagnosis not present

## 2022-07-27 DIAGNOSIS — Z7951 Long term (current) use of inhaled steroids: Secondary | ICD-10-CM | POA: Diagnosis not present

## 2022-07-27 DIAGNOSIS — Z9181 History of falling: Secondary | ICD-10-CM | POA: Diagnosis not present

## 2022-07-27 DIAGNOSIS — M545 Low back pain, unspecified: Secondary | ICD-10-CM | POA: Diagnosis not present

## 2022-07-27 DIAGNOSIS — Z8673 Personal history of transient ischemic attack (TIA), and cerebral infarction without residual deficits: Secondary | ICD-10-CM | POA: Diagnosis not present

## 2022-07-27 DIAGNOSIS — F02B0 Dementia in other diseases classified elsewhere, moderate, without behavioral disturbance, psychotic disturbance, mood disturbance, and anxiety: Secondary | ICD-10-CM | POA: Diagnosis not present

## 2022-07-27 DIAGNOSIS — G629 Polyneuropathy, unspecified: Secondary | ICD-10-CM | POA: Diagnosis not present

## 2022-07-27 DIAGNOSIS — M8580 Other specified disorders of bone density and structure, unspecified site: Secondary | ICD-10-CM | POA: Diagnosis not present

## 2022-07-27 DIAGNOSIS — E785 Hyperlipidemia, unspecified: Secondary | ICD-10-CM | POA: Diagnosis not present

## 2022-07-27 DIAGNOSIS — Z556 Problems related to health literacy: Secondary | ICD-10-CM | POA: Diagnosis not present

## 2022-07-27 DIAGNOSIS — J441 Chronic obstructive pulmonary disease with (acute) exacerbation: Secondary | ICD-10-CM | POA: Diagnosis not present

## 2022-07-27 DIAGNOSIS — I200 Unstable angina: Secondary | ICD-10-CM | POA: Diagnosis not present

## 2022-07-27 DIAGNOSIS — I10 Essential (primary) hypertension: Secondary | ICD-10-CM | POA: Diagnosis not present

## 2022-07-27 DIAGNOSIS — E538 Deficiency of other specified B group vitamins: Secondary | ICD-10-CM | POA: Diagnosis not present

## 2022-07-27 DIAGNOSIS — J9611 Chronic respiratory failure with hypoxia: Secondary | ICD-10-CM | POA: Diagnosis not present

## 2022-07-28 DIAGNOSIS — Z7951 Long term (current) use of inhaled steroids: Secondary | ICD-10-CM | POA: Diagnosis not present

## 2022-07-28 DIAGNOSIS — I200 Unstable angina: Secondary | ICD-10-CM | POA: Diagnosis not present

## 2022-07-28 DIAGNOSIS — M8580 Other specified disorders of bone density and structure, unspecified site: Secondary | ICD-10-CM | POA: Diagnosis not present

## 2022-07-28 DIAGNOSIS — J441 Chronic obstructive pulmonary disease with (acute) exacerbation: Secondary | ICD-10-CM | POA: Diagnosis not present

## 2022-07-28 DIAGNOSIS — I10 Essential (primary) hypertension: Secondary | ICD-10-CM | POA: Diagnosis not present

## 2022-07-28 DIAGNOSIS — Z9181 History of falling: Secondary | ICD-10-CM | POA: Diagnosis not present

## 2022-07-28 DIAGNOSIS — M545 Low back pain, unspecified: Secondary | ICD-10-CM | POA: Diagnosis not present

## 2022-07-28 DIAGNOSIS — Z556 Problems related to health literacy: Secondary | ICD-10-CM | POA: Diagnosis not present

## 2022-07-28 DIAGNOSIS — E538 Deficiency of other specified B group vitamins: Secondary | ICD-10-CM | POA: Diagnosis not present

## 2022-07-28 DIAGNOSIS — G8929 Other chronic pain: Secondary | ICD-10-CM | POA: Diagnosis not present

## 2022-07-28 DIAGNOSIS — G629 Polyneuropathy, unspecified: Secondary | ICD-10-CM | POA: Diagnosis not present

## 2022-07-28 DIAGNOSIS — Z7982 Long term (current) use of aspirin: Secondary | ICD-10-CM | POA: Diagnosis not present

## 2022-07-28 DIAGNOSIS — Z8673 Personal history of transient ischemic attack (TIA), and cerebral infarction without residual deficits: Secondary | ICD-10-CM | POA: Diagnosis not present

## 2022-07-28 DIAGNOSIS — J9611 Chronic respiratory failure with hypoxia: Secondary | ICD-10-CM | POA: Diagnosis not present

## 2022-07-28 DIAGNOSIS — E785 Hyperlipidemia, unspecified: Secondary | ICD-10-CM | POA: Diagnosis not present

## 2022-07-28 DIAGNOSIS — F02B0 Dementia in other diseases classified elsewhere, moderate, without behavioral disturbance, psychotic disturbance, mood disturbance, and anxiety: Secondary | ICD-10-CM | POA: Diagnosis not present

## 2022-07-29 DIAGNOSIS — J9611 Chronic respiratory failure with hypoxia: Secondary | ICD-10-CM | POA: Diagnosis not present

## 2022-07-31 DIAGNOSIS — J9611 Chronic respiratory failure with hypoxia: Secondary | ICD-10-CM | POA: Diagnosis not present

## 2022-07-31 DIAGNOSIS — J441 Chronic obstructive pulmonary disease with (acute) exacerbation: Secondary | ICD-10-CM | POA: Diagnosis not present

## 2022-07-31 DIAGNOSIS — E538 Deficiency of other specified B group vitamins: Secondary | ICD-10-CM | POA: Diagnosis not present

## 2022-07-31 DIAGNOSIS — I200 Unstable angina: Secondary | ICD-10-CM | POA: Diagnosis not present

## 2022-07-31 DIAGNOSIS — Z9181 History of falling: Secondary | ICD-10-CM | POA: Diagnosis not present

## 2022-07-31 DIAGNOSIS — I10 Essential (primary) hypertension: Secondary | ICD-10-CM | POA: Diagnosis not present

## 2022-07-31 DIAGNOSIS — M8580 Other specified disorders of bone density and structure, unspecified site: Secondary | ICD-10-CM | POA: Diagnosis not present

## 2022-07-31 DIAGNOSIS — Z7982 Long term (current) use of aspirin: Secondary | ICD-10-CM | POA: Diagnosis not present

## 2022-07-31 DIAGNOSIS — Z8673 Personal history of transient ischemic attack (TIA), and cerebral infarction without residual deficits: Secondary | ICD-10-CM | POA: Diagnosis not present

## 2022-07-31 DIAGNOSIS — F02B0 Dementia in other diseases classified elsewhere, moderate, without behavioral disturbance, psychotic disturbance, mood disturbance, and anxiety: Secondary | ICD-10-CM | POA: Diagnosis not present

## 2022-07-31 DIAGNOSIS — G8929 Other chronic pain: Secondary | ICD-10-CM | POA: Diagnosis not present

## 2022-07-31 DIAGNOSIS — Z556 Problems related to health literacy: Secondary | ICD-10-CM | POA: Diagnosis not present

## 2022-07-31 DIAGNOSIS — Z7951 Long term (current) use of inhaled steroids: Secondary | ICD-10-CM | POA: Diagnosis not present

## 2022-07-31 DIAGNOSIS — E785 Hyperlipidemia, unspecified: Secondary | ICD-10-CM | POA: Diagnosis not present

## 2022-07-31 DIAGNOSIS — G629 Polyneuropathy, unspecified: Secondary | ICD-10-CM | POA: Diagnosis not present

## 2022-07-31 DIAGNOSIS — M545 Low back pain, unspecified: Secondary | ICD-10-CM | POA: Diagnosis not present

## 2022-08-01 DIAGNOSIS — Z556 Problems related to health literacy: Secondary | ICD-10-CM | POA: Diagnosis not present

## 2022-08-01 DIAGNOSIS — M8580 Other specified disorders of bone density and structure, unspecified site: Secondary | ICD-10-CM | POA: Diagnosis not present

## 2022-08-01 DIAGNOSIS — E538 Deficiency of other specified B group vitamins: Secondary | ICD-10-CM | POA: Diagnosis not present

## 2022-08-01 DIAGNOSIS — E785 Hyperlipidemia, unspecified: Secondary | ICD-10-CM | POA: Diagnosis not present

## 2022-08-01 DIAGNOSIS — J441 Chronic obstructive pulmonary disease with (acute) exacerbation: Secondary | ICD-10-CM | POA: Diagnosis not present

## 2022-08-01 DIAGNOSIS — Z7982 Long term (current) use of aspirin: Secondary | ICD-10-CM | POA: Diagnosis not present

## 2022-08-01 DIAGNOSIS — F02B0 Dementia in other diseases classified elsewhere, moderate, without behavioral disturbance, psychotic disturbance, mood disturbance, and anxiety: Secondary | ICD-10-CM | POA: Diagnosis not present

## 2022-08-01 DIAGNOSIS — J9611 Chronic respiratory failure with hypoxia: Secondary | ICD-10-CM | POA: Diagnosis not present

## 2022-08-01 DIAGNOSIS — G629 Polyneuropathy, unspecified: Secondary | ICD-10-CM | POA: Diagnosis not present

## 2022-08-01 DIAGNOSIS — I200 Unstable angina: Secondary | ICD-10-CM | POA: Diagnosis not present

## 2022-08-01 DIAGNOSIS — Z7951 Long term (current) use of inhaled steroids: Secondary | ICD-10-CM | POA: Diagnosis not present

## 2022-08-01 DIAGNOSIS — Z9181 History of falling: Secondary | ICD-10-CM | POA: Diagnosis not present

## 2022-08-01 DIAGNOSIS — Z8673 Personal history of transient ischemic attack (TIA), and cerebral infarction without residual deficits: Secondary | ICD-10-CM | POA: Diagnosis not present

## 2022-08-01 DIAGNOSIS — M545 Low back pain, unspecified: Secondary | ICD-10-CM | POA: Diagnosis not present

## 2022-08-01 DIAGNOSIS — I10 Essential (primary) hypertension: Secondary | ICD-10-CM | POA: Diagnosis not present

## 2022-08-01 DIAGNOSIS — G8929 Other chronic pain: Secondary | ICD-10-CM | POA: Diagnosis not present

## 2022-08-03 DIAGNOSIS — Z7951 Long term (current) use of inhaled steroids: Secondary | ICD-10-CM | POA: Diagnosis not present

## 2022-08-03 DIAGNOSIS — M8580 Other specified disorders of bone density and structure, unspecified site: Secondary | ICD-10-CM | POA: Diagnosis not present

## 2022-08-03 DIAGNOSIS — G629 Polyneuropathy, unspecified: Secondary | ICD-10-CM | POA: Diagnosis not present

## 2022-08-03 DIAGNOSIS — J441 Chronic obstructive pulmonary disease with (acute) exacerbation: Secondary | ICD-10-CM | POA: Diagnosis not present

## 2022-08-03 DIAGNOSIS — J9611 Chronic respiratory failure with hypoxia: Secondary | ICD-10-CM | POA: Diagnosis not present

## 2022-08-03 DIAGNOSIS — M545 Low back pain, unspecified: Secondary | ICD-10-CM | POA: Diagnosis not present

## 2022-08-03 DIAGNOSIS — E785 Hyperlipidemia, unspecified: Secondary | ICD-10-CM | POA: Diagnosis not present

## 2022-08-03 DIAGNOSIS — F02B0 Dementia in other diseases classified elsewhere, moderate, without behavioral disturbance, psychotic disturbance, mood disturbance, and anxiety: Secondary | ICD-10-CM | POA: Diagnosis not present

## 2022-08-03 DIAGNOSIS — I10 Essential (primary) hypertension: Secondary | ICD-10-CM | POA: Diagnosis not present

## 2022-08-03 DIAGNOSIS — Z7982 Long term (current) use of aspirin: Secondary | ICD-10-CM | POA: Diagnosis not present

## 2022-08-03 DIAGNOSIS — Z556 Problems related to health literacy: Secondary | ICD-10-CM | POA: Diagnosis not present

## 2022-08-03 DIAGNOSIS — I200 Unstable angina: Secondary | ICD-10-CM | POA: Diagnosis not present

## 2022-08-03 DIAGNOSIS — Z9181 History of falling: Secondary | ICD-10-CM | POA: Diagnosis not present

## 2022-08-03 DIAGNOSIS — E538 Deficiency of other specified B group vitamins: Secondary | ICD-10-CM | POA: Diagnosis not present

## 2022-08-03 DIAGNOSIS — G8929 Other chronic pain: Secondary | ICD-10-CM | POA: Diagnosis not present

## 2022-08-03 DIAGNOSIS — Z8673 Personal history of transient ischemic attack (TIA), and cerebral infarction without residual deficits: Secondary | ICD-10-CM | POA: Diagnosis not present

## 2022-08-07 ENCOUNTER — Other Ambulatory Visit: Payer: Self-pay | Admitting: Internal Medicine

## 2022-08-07 ENCOUNTER — Other Ambulatory Visit: Payer: Self-pay | Admitting: Family Medicine

## 2022-08-07 DIAGNOSIS — I10 Essential (primary) hypertension: Secondary | ICD-10-CM | POA: Diagnosis not present

## 2022-08-07 DIAGNOSIS — J9611 Chronic respiratory failure with hypoxia: Secondary | ICD-10-CM | POA: Diagnosis not present

## 2022-08-07 DIAGNOSIS — F02B0 Dementia in other diseases classified elsewhere, moderate, without behavioral disturbance, psychotic disturbance, mood disturbance, and anxiety: Secondary | ICD-10-CM | POA: Diagnosis not present

## 2022-08-07 DIAGNOSIS — Z8673 Personal history of transient ischemic attack (TIA), and cerebral infarction without residual deficits: Secondary | ICD-10-CM | POA: Diagnosis not present

## 2022-08-07 DIAGNOSIS — M545 Low back pain, unspecified: Secondary | ICD-10-CM | POA: Diagnosis not present

## 2022-08-07 DIAGNOSIS — E538 Deficiency of other specified B group vitamins: Secondary | ICD-10-CM | POA: Diagnosis not present

## 2022-08-07 DIAGNOSIS — Z9181 History of falling: Secondary | ICD-10-CM | POA: Diagnosis not present

## 2022-08-07 DIAGNOSIS — G8929 Other chronic pain: Secondary | ICD-10-CM | POA: Diagnosis not present

## 2022-08-07 DIAGNOSIS — Z556 Problems related to health literacy: Secondary | ICD-10-CM | POA: Diagnosis not present

## 2022-08-07 DIAGNOSIS — I200 Unstable angina: Secondary | ICD-10-CM | POA: Diagnosis not present

## 2022-08-07 DIAGNOSIS — Z7982 Long term (current) use of aspirin: Secondary | ICD-10-CM | POA: Diagnosis not present

## 2022-08-07 DIAGNOSIS — Z7951 Long term (current) use of inhaled steroids: Secondary | ICD-10-CM | POA: Diagnosis not present

## 2022-08-07 DIAGNOSIS — J441 Chronic obstructive pulmonary disease with (acute) exacerbation: Secondary | ICD-10-CM | POA: Diagnosis not present

## 2022-08-07 DIAGNOSIS — G629 Polyneuropathy, unspecified: Secondary | ICD-10-CM | POA: Diagnosis not present

## 2022-08-07 DIAGNOSIS — M8580 Other specified disorders of bone density and structure, unspecified site: Secondary | ICD-10-CM | POA: Diagnosis not present

## 2022-08-07 DIAGNOSIS — E785 Hyperlipidemia, unspecified: Secondary | ICD-10-CM | POA: Diagnosis not present

## 2022-08-09 DIAGNOSIS — J9611 Chronic respiratory failure with hypoxia: Secondary | ICD-10-CM | POA: Diagnosis not present

## 2022-08-10 ENCOUNTER — Telehealth: Payer: Self-pay | Admitting: Family Medicine

## 2022-08-10 ENCOUNTER — Other Ambulatory Visit: Payer: Self-pay | Admitting: Pulmonary Disease

## 2022-08-10 DIAGNOSIS — R413 Other amnesia: Secondary | ICD-10-CM

## 2022-08-10 MED ORDER — HYDROCODONE-ACETAMINOPHEN 5-325 MG PO TABS: 1.0000 | ORAL_TABLET | Freq: Four times a day (QID) | ORAL | 0 refills | Status: DC | PRN

## 2022-08-10 NOTE — Telephone Encounter (Signed)
Revonda Standard from [facility] called stating the pt's daughter is interested in their palliative care program. Revonda Standard wants to get Dr. Lianne Bushy approval to proceed with the pt? If so, Revonda Standard has also requested verbal orders as well? Call back # [phone], secured

## 2022-08-10 NOTE — Telephone Encounter (Signed)
Sent. Thanks.   

## 2022-08-10 NOTE — Telephone Encounter (Signed)
Did the patient/daughter consent to change to palliative care?  If so I can put in the referral.  Please let me know.  Thanks.

## 2022-08-10 NOTE — Telephone Encounter (Signed)
They were with [facility] and [facility] to [facility]. Okay to give verbal order?

## 2022-08-10 NOTE — Telephone Encounter (Signed)
LOV - 06/16/22 NOV - not scheduled RF - 05/10/22 #120/0

## 2022-08-10 NOTE — Telephone Encounter (Signed)
Prescription Request  08/10/2022  LOV: 03/06/2022  What is the name of the medication or equipment? HYDROcodone-acetaminophen (NORCO/VICODIN) 5-325 MG table   Have you contacted your pharmacy to request a refill? No   Which pharmacy would you like this sent to?  [facility] [address] ([address]), [place] - [address] [place]) [place] [address] Phone: [phone] Fax: [phone]    Patient notified that their request is being sent to the clinical staff for review and that they should receive a response within 2 business days.   Please advise at Mobile [phone] (mobile)  Patient is out of this medication

## 2022-08-10 NOTE — Telephone Encounter (Signed)
They both consented to pallative care; this has been an ongoing issue with being referred to several places. They are hoping this is the right place for [facility] this time. There are two other referrals for palliative care in the system that have been closed out already. Daughter and patient would like to proceed.

## 2022-08-11 NOTE — Telephone Encounter (Signed)
I put in the referral.  Thanks.  

## 2022-08-11 NOTE — Addendum Note (Signed)
Addended by: Joaquim Nam on: 08/11/2022 02:03 PM   Modules accepted: Orders

## 2022-08-14 DIAGNOSIS — E785 Hyperlipidemia, unspecified: Secondary | ICD-10-CM | POA: Diagnosis not present

## 2022-08-14 DIAGNOSIS — Z7951 Long term (current) use of inhaled steroids: Secondary | ICD-10-CM | POA: Diagnosis not present

## 2022-08-14 DIAGNOSIS — Z8673 Personal history of transient ischemic attack (TIA), and cerebral infarction without residual deficits: Secondary | ICD-10-CM | POA: Diagnosis not present

## 2022-08-14 DIAGNOSIS — M8580 Other specified disorders of bone density and structure, unspecified site: Secondary | ICD-10-CM | POA: Diagnosis not present

## 2022-08-14 DIAGNOSIS — J9611 Chronic respiratory failure with hypoxia: Secondary | ICD-10-CM | POA: Diagnosis not present

## 2022-08-14 DIAGNOSIS — Z9181 History of falling: Secondary | ICD-10-CM | POA: Diagnosis not present

## 2022-08-14 DIAGNOSIS — G8929 Other chronic pain: Secondary | ICD-10-CM | POA: Diagnosis not present

## 2022-08-14 DIAGNOSIS — F02B0 Dementia in other diseases classified elsewhere, moderate, without behavioral disturbance, psychotic disturbance, mood disturbance, and anxiety: Secondary | ICD-10-CM | POA: Diagnosis not present

## 2022-08-14 DIAGNOSIS — I10 Essential (primary) hypertension: Secondary | ICD-10-CM | POA: Diagnosis not present

## 2022-08-14 DIAGNOSIS — Z556 Problems related to health literacy: Secondary | ICD-10-CM | POA: Diagnosis not present

## 2022-08-14 DIAGNOSIS — M545 Low back pain, unspecified: Secondary | ICD-10-CM | POA: Diagnosis not present

## 2022-08-14 DIAGNOSIS — Z7982 Long term (current) use of aspirin: Secondary | ICD-10-CM | POA: Diagnosis not present

## 2022-08-14 DIAGNOSIS — E538 Deficiency of other specified B group vitamins: Secondary | ICD-10-CM | POA: Diagnosis not present

## 2022-08-14 DIAGNOSIS — G629 Polyneuropathy, unspecified: Secondary | ICD-10-CM | POA: Diagnosis not present

## 2022-08-14 DIAGNOSIS — I200 Unstable angina: Secondary | ICD-10-CM | POA: Diagnosis not present

## 2022-08-14 DIAGNOSIS — J441 Chronic obstructive pulmonary disease with (acute) exacerbation: Secondary | ICD-10-CM | POA: Diagnosis not present

## 2022-08-15 DIAGNOSIS — M545 Low back pain, unspecified: Secondary | ICD-10-CM | POA: Diagnosis not present

## 2022-08-15 DIAGNOSIS — Z7982 Long term (current) use of aspirin: Secondary | ICD-10-CM | POA: Diagnosis not present

## 2022-08-15 DIAGNOSIS — Z556 Problems related to health literacy: Secondary | ICD-10-CM | POA: Diagnosis not present

## 2022-08-15 DIAGNOSIS — E785 Hyperlipidemia, unspecified: Secondary | ICD-10-CM | POA: Diagnosis not present

## 2022-08-15 DIAGNOSIS — G8929 Other chronic pain: Secondary | ICD-10-CM | POA: Diagnosis not present

## 2022-08-15 DIAGNOSIS — M8580 Other specified disorders of bone density and structure, unspecified site: Secondary | ICD-10-CM | POA: Diagnosis not present

## 2022-08-15 DIAGNOSIS — J9611 Chronic respiratory failure with hypoxia: Secondary | ICD-10-CM | POA: Diagnosis not present

## 2022-08-15 DIAGNOSIS — I200 Unstable angina: Secondary | ICD-10-CM | POA: Diagnosis not present

## 2022-08-15 DIAGNOSIS — Z7951 Long term (current) use of inhaled steroids: Secondary | ICD-10-CM | POA: Diagnosis not present

## 2022-08-15 DIAGNOSIS — J441 Chronic obstructive pulmonary disease with (acute) exacerbation: Secondary | ICD-10-CM | POA: Diagnosis not present

## 2022-08-15 DIAGNOSIS — E538 Deficiency of other specified B group vitamins: Secondary | ICD-10-CM | POA: Diagnosis not present

## 2022-08-15 DIAGNOSIS — I10 Essential (primary) hypertension: Secondary | ICD-10-CM | POA: Diagnosis not present

## 2022-08-15 DIAGNOSIS — Z9181 History of falling: Secondary | ICD-10-CM | POA: Diagnosis not present

## 2022-08-15 DIAGNOSIS — Z8673 Personal history of transient ischemic attack (TIA), and cerebral infarction without residual deficits: Secondary | ICD-10-CM | POA: Diagnosis not present

## 2022-08-15 DIAGNOSIS — F02B0 Dementia in other diseases classified elsewhere, moderate, without behavioral disturbance, psychotic disturbance, mood disturbance, and anxiety: Secondary | ICD-10-CM | POA: Diagnosis not present

## 2022-08-15 DIAGNOSIS — G629 Polyneuropathy, unspecified: Secondary | ICD-10-CM | POA: Diagnosis not present

## 2022-08-17 DIAGNOSIS — I10 Essential (primary) hypertension: Secondary | ICD-10-CM | POA: Diagnosis not present

## 2022-08-17 DIAGNOSIS — M8580 Other specified disorders of bone density and structure, unspecified site: Secondary | ICD-10-CM | POA: Diagnosis not present

## 2022-08-17 DIAGNOSIS — I200 Unstable angina: Secondary | ICD-10-CM | POA: Diagnosis not present

## 2022-08-17 DIAGNOSIS — G629 Polyneuropathy, unspecified: Secondary | ICD-10-CM | POA: Diagnosis not present

## 2022-08-17 DIAGNOSIS — M545 Low back pain, unspecified: Secondary | ICD-10-CM | POA: Diagnosis not present

## 2022-08-17 DIAGNOSIS — Z556 Problems related to health literacy: Secondary | ICD-10-CM | POA: Diagnosis not present

## 2022-08-17 DIAGNOSIS — Z7951 Long term (current) use of inhaled steroids: Secondary | ICD-10-CM | POA: Diagnosis not present

## 2022-08-17 DIAGNOSIS — J441 Chronic obstructive pulmonary disease with (acute) exacerbation: Secondary | ICD-10-CM | POA: Diagnosis not present

## 2022-08-17 DIAGNOSIS — J9611 Chronic respiratory failure with hypoxia: Secondary | ICD-10-CM | POA: Diagnosis not present

## 2022-08-17 DIAGNOSIS — Z9181 History of falling: Secondary | ICD-10-CM | POA: Diagnosis not present

## 2022-08-17 DIAGNOSIS — G8929 Other chronic pain: Secondary | ICD-10-CM | POA: Diagnosis not present

## 2022-08-17 DIAGNOSIS — E538 Deficiency of other specified B group vitamins: Secondary | ICD-10-CM | POA: Diagnosis not present

## 2022-08-17 DIAGNOSIS — E785 Hyperlipidemia, unspecified: Secondary | ICD-10-CM | POA: Diagnosis not present

## 2022-08-17 DIAGNOSIS — Z8673 Personal history of transient ischemic attack (TIA), and cerebral infarction without residual deficits: Secondary | ICD-10-CM | POA: Diagnosis not present

## 2022-08-17 DIAGNOSIS — F02B0 Dementia in other diseases classified elsewhere, moderate, without behavioral disturbance, psychotic disturbance, mood disturbance, and anxiety: Secondary | ICD-10-CM | POA: Diagnosis not present

## 2022-08-17 DIAGNOSIS — Z7982 Long term (current) use of aspirin: Secondary | ICD-10-CM | POA: Diagnosis not present

## 2022-08-21 ENCOUNTER — Other Ambulatory Visit: Payer: Self-pay

## 2022-08-21 ENCOUNTER — Encounter (HOSPITAL_COMMUNITY): Payer: Self-pay

## 2022-08-21 ENCOUNTER — Emergency Department (HOSPITAL_COMMUNITY): Payer: Medicare HMO

## 2022-08-21 ENCOUNTER — Telehealth: Payer: Self-pay | Admitting: Family Medicine

## 2022-08-21 ENCOUNTER — Emergency Department (HOSPITAL_COMMUNITY)
Admission: EM | Admit: 2022-08-21 | Discharge: 2022-08-21 | Disposition: A | Discharge status: Home / Self Care | Payer: Medicare HMO | Attending: Emergency Medicine | Admitting: Emergency Medicine

## 2022-08-21 DIAGNOSIS — M8580 Other specified disorders of bone density and structure, unspecified site: Secondary | ICD-10-CM | POA: Diagnosis not present

## 2022-08-21 DIAGNOSIS — Z7982 Long term (current) use of aspirin: Secondary | ICD-10-CM | POA: Diagnosis not present

## 2022-08-21 DIAGNOSIS — G629 Polyneuropathy, unspecified: Secondary | ICD-10-CM | POA: Diagnosis not present

## 2022-08-21 DIAGNOSIS — R0602 Shortness of breath: Secondary | ICD-10-CM | POA: Diagnosis not present

## 2022-08-21 DIAGNOSIS — Z7951 Long term (current) use of inhaled steroids: Secondary | ICD-10-CM | POA: Diagnosis not present

## 2022-08-21 DIAGNOSIS — E538 Deficiency of other specified B group vitamins: Secondary | ICD-10-CM | POA: Diagnosis not present

## 2022-08-21 DIAGNOSIS — G8929 Other chronic pain: Secondary | ICD-10-CM | POA: Diagnosis not present

## 2022-08-21 DIAGNOSIS — F02B0 Dementia in other diseases classified elsewhere, moderate, without behavioral disturbance, psychotic disturbance, mood disturbance, and anxiety: Secondary | ICD-10-CM | POA: Diagnosis not present

## 2022-08-21 DIAGNOSIS — J449 Chronic obstructive pulmonary disease, unspecified: Secondary | ICD-10-CM | POA: Diagnosis not present

## 2022-08-21 DIAGNOSIS — F0390 Unspecified dementia without behavioral disturbance: Secondary | ICD-10-CM | POA: Insufficient documentation

## 2022-08-21 DIAGNOSIS — I10 Essential (primary) hypertension: Secondary | ICD-10-CM | POA: Diagnosis not present

## 2022-08-21 DIAGNOSIS — R079 Chest pain, unspecified: Secondary | ICD-10-CM | POA: Diagnosis not present

## 2022-08-21 DIAGNOSIS — M545 Low back pain, unspecified: Secondary | ICD-10-CM | POA: Diagnosis not present

## 2022-08-21 DIAGNOSIS — I200 Unstable angina: Secondary | ICD-10-CM | POA: Diagnosis not present

## 2022-08-21 DIAGNOSIS — R600 Localized edema: Secondary | ICD-10-CM | POA: Diagnosis not present

## 2022-08-21 DIAGNOSIS — Z556 Problems related to health literacy: Secondary | ICD-10-CM | POA: Diagnosis not present

## 2022-08-21 DIAGNOSIS — N3090 Cystitis, unspecified without hematuria: Secondary | ICD-10-CM | POA: Diagnosis not present

## 2022-08-21 DIAGNOSIS — Z8673 Personal history of transient ischemic attack (TIA), and cerebral infarction without residual deficits: Secondary | ICD-10-CM | POA: Diagnosis not present

## 2022-08-21 DIAGNOSIS — J441 Chronic obstructive pulmonary disease with (acute) exacerbation: Secondary | ICD-10-CM | POA: Diagnosis not present

## 2022-08-21 DIAGNOSIS — E785 Hyperlipidemia, unspecified: Secondary | ICD-10-CM | POA: Diagnosis not present

## 2022-08-21 DIAGNOSIS — Z9181 History of falling: Secondary | ICD-10-CM | POA: Diagnosis not present

## 2022-08-21 DIAGNOSIS — J9611 Chronic respiratory failure with hypoxia: Secondary | ICD-10-CM | POA: Diagnosis not present

## 2022-08-21 LAB — CBC
HCT: 38.8 % (ref 36.0–46.0)
Hemoglobin: 12.5 g/dL (ref 12.0–15.0)
MCH: 31.6 pg (ref 26.0–34.0)
MCHC: 32.2 g/dL (ref 30.0–36.0)
MCV: 98 fL (ref 80.0–100.0)
Platelets: 225 10*3/uL (ref 150–400)
RBC: 3.96 MIL/uL (ref 3.87–5.11)
RDW: 12.2 % (ref 11.5–15.5)
WBC: 8.3 10*3/uL (ref 4.0–10.5)
nRBC: 0 % (ref 0.0–0.2)

## 2022-08-21 LAB — BASIC METABOLIC PANEL
Anion gap: 13 (ref 5–15)
BUN: 31 mg/dL — ABNORMAL HIGH (ref 8–23)
CO2: 29 mmol/L (ref 22–32)
Calcium: 8.8 mg/dL — ABNORMAL LOW (ref 8.9–10.3)
Chloride: 96 mmol/L — ABNORMAL LOW (ref 98–111)
Creatinine, Ser: 1.44 mg/dL — ABNORMAL HIGH (ref 0.44–1.00)
GFR, Estimated: 37 mL/min — ABNORMAL LOW (ref >60)
Glucose, Bld: 92 mg/dL (ref 70–99)
Potassium: 4 mmol/L (ref 3.5–5.1)
Sodium: 138 mmol/L (ref 135–145)

## 2022-08-21 LAB — URINALYSIS, ROUTINE W REFLEX MICROSCOPIC
Bilirubin Urine: NEGATIVE
Glucose, UA: NEGATIVE mg/dL
Hgb urine dipstick: NEGATIVE
Ketones, ur: NEGATIVE mg/dL
Nitrite: POSITIVE — AB
Protein, ur: NEGATIVE mg/dL
Specific Gravity, Urine: 1.009 (ref 1.005–1.030)
WBC, UA: >50 WBC/hpf (ref 0–5)
pH: 6 (ref 5.0–8.0)

## 2022-08-21 LAB — BRAIN NATRIURETIC PEPTIDE: B Natriuretic Peptide: 23.9 pg/mL (ref 0.0–100.0)

## 2022-08-21 LAB — TROPONIN I (HIGH SENSITIVITY): Troponin I (High Sensitivity): 5 ng/L (ref <18)

## 2022-08-21 MED ORDER — CEFDINIR 300 MG PO CAPS: 300.0000 mg | ORAL_CAPSULE | Freq: Every day | ORAL | 0 refills | Status: AC

## 2022-08-21 MED ORDER — SODIUM CHLORIDE 0.9 % IV SOLN
1.0000 g | Freq: Once | INTRAVENOUS | Status: AC
  Administered 2022-08-21: 1 g via INTRAVENOUS
  Filled 2022-08-21: qty 10

## 2022-08-21 MED ORDER — HYDROCODONE-ACETAMINOPHEN 5-325 MG PO TABS
1.0000 | ORAL_TABLET | Freq: Once | ORAL | Status: AC
  Administered 2022-08-21: 1 via ORAL
  Filled 2022-08-21: qty 1

## 2022-08-21 NOTE — Telephone Encounter (Signed)
Will await ER report.  Thanks.  

## 2022-08-21 NOTE — Discharge Instructions (Addendum)
Your urinalysis is concerning for urinary tract infection.  You are being sent home on antibiotics.  Recommend you follow-up closely with your doctor within the next week to schedule reevaluation.  If you develop chest pain, worsening difficulty breathing or any other new concerning symptoms you should return to the ED.

## 2022-08-21 NOTE — ED Provider Notes (Signed)
[place] EMERGENCY DEPARTMENT AT [facility] Provider Note   CSN: [id number] Arrival date & time: 08/21/22  1106     History  No chief complaint on file.   Megan Peterson is a 82 y.o. female.  HPI 82 year old female history of dementia, COPD, hyperlipidemia presenting for shortness of breath and leg swelling.  Patient is here with her daughter who is her caregiver.  Daughter states for the last couple weeks she has had bilateral lower extremity edema.  Her doctor's office has been managing it with Lasix which she has been taking without significant improvement.  Patient was to her DVT or PE and is not on anticoagulation.  She has no asymmetric edema.  Patient had home health care coming today and noted that her feet were still swollen but not swollen more than prior.  They also felt like she was wheezing earlier and does have a history of COPD.  The nursing aide recommend they go to the ED.  Patient's daughter called the doctor's office who sent him to the ED.  Currently patient states she is asymptomatic.  She denies chest pain, shortness of breath, Diff[place] breathing or any other concerns per daughter notes she does have dementia so history is challenging  Daughter states patient has occasional hallucinations recently but these are not new.  She otherwise has been acting normally.  She is not noting any pain and has been eating and drinking normally.  No fevers or chills or increased cough.  Currently daughter feels like her breathing is at baseline.  Daughter notes patient is currently undergoing palliative care and may be pursuing hospice.    Home Medications Prior to Admission medications   Medication Sig Start Date End Date Taking? Authorizing Provider  cefdinir (OMNICEF) 300 MG capsule Take 1 capsule (300 mg total) by mouth daily for 7 days. 08/21/22 08/28/22 Yes Laurence Spates, MD  albuterol (ACCUNEB) 0.63 MG/3ML nebulizer solution USE 1 VIAL IN NEBULIZER EVERY 6 HOURS AS  NEEDED FOR WHEEZING 08/10/22   Oretha Milch, MD  albuterol (VENTOLIN HFA) 108 (90 Base) MCG/ACT inhaler INHALE 1 TO 2 PUFFS BY MOUTH EVERY 6 HOURS AS NEEDED FOR WHEEZING FOR SHORTNESS OF BREATH Patient taking differently: Inhale 1-2 puffs into the lungs every 6 (six) hours as needed for wheezing or shortness of breath. 05/03/22   Joaquim Nam, MD  artificial tears (LACRILUBE) OINT ophthalmic ointment Place into both eyes every 4 (four) hours as needed for dry eyes. Patient not taking: Reported on 07/10/2022 07/02/22   Berton Mount I, MD  fluticasone (FLOVENT HFA) 110 MCG/ACT inhaler Inhale 2 puffs into the lungs 2 (two) times daily. Patient not taking: Reported on 07/10/2022 07/02/22 07/02/23  Berton Mount I, MD  furosemide (LASIX) 20 MG tablet TAKE 1 TABLET BY MOUTH ONCE DAILY AS NEEDED IN THE MORNING FOR FLUID 08/07/22   Joaquim Nam, MD  gabapentin (NEURONTIN) 400 MG capsule Take 1 capsule (400 mg total) by mouth 3 (three) times daily. Patient taking differently: Take 400 mg by mouth See admin instructions. Take 400 mg by mouth at 8 AM, 2 PM, and 9 PM 07/02/22 08/01/22  Berton Mount I, MD  HYDROcodone-acetaminophen (NORCO/VICODIN) 5-325 MG tablet Take 1 tablet by mouth every 6 (six) hours as needed for moderate pain. 08/10/22   Joaquim Nam, MD  memantine (NAMENDA) 10 MG tablet Take 1 tablet by mouth twice daily Patient taking differently: Take 10 mg by mouth in the morning and at  bedtime. 04/04/22   Drema Dallas, DO  metoprolol succinate (TOPROL-XL) 25 MG 24 hr tablet Take 1 tablet by mouth once daily 08/07/22   Joaquim Nam, MD  nitroGLYCERIN (NITROSTAT) 0.4 MG SL tablet Place 0.4 mg under the tongue every 5 (five) minutes as needed for chest pain (max 3 doses in 15 minutes.  if still having chest pain then go to the ER). 06/13/21   Reed, Tiffany L, DO  nystatin (MYCOSTATIN/NYSTOP) powder Apply 1 Application topically 3 (three) times daily. 05/10/22   Joaquim Nam, MD  OXYGEN  Inhale 2 L/min into the lungs as needed (for shortness of breath).    [provider]  polyethylene glycol (MIRALAX / GLYCOLAX) 17 g packet Take 17 g by mouth daily as needed for mild constipation. 07/02/22   Barnetta Chapel, MD  umeclidinium-vilanterol (ANORO ELLIPTA) 62.5-25 MCG/ACT AEPB Inhale 1 puff by mouth once daily Patient taking differently: Inhale 1 puff into the lungs daily. 02/11/22   Joaquim Nam, MD  vitamin B-12 (CYANOCOBALAMIN) 1000 MCG tablet Take 1 tablet (1,000 mcg total) by mouth daily. 06/29/21   Joaquim Nam, MD      Allergies    Buspar [buspirone], Codeine, Cymbalta [duloxetine hcl], Lexapro [escitalopram oxalate], Mirtazapine, Morphine and codeine, Sertraline, and Statins    Review of Systems   Review of Systems Review of systems completed and notable as per HPI.  ROS otherwise negative.   Physical Exam Updated Vital Signs BP 133/60   Pulse 82   Temp 97.7 F (36.5 C) (Oral)   Resp 16   Ht 4\' 10"  (1.473 m)   Wt 64 kg   SpO2 91%   BMI 29.49 kg/m  Physical Exam Vitals and nursing note reviewed.  Constitutional:      General: She is not in acute distress.    Appearance: She is well-developed.  HENT:     Head: Normocephalic and atraumatic.     Nose: Nose normal.     Mouth/Throat:     Mouth: Mucous membranes are moist.     Pharynx: Oropharynx is clear.  Eyes:     Extraocular Movements: Extraocular movements intact.     Conjunctiva/sclera: Conjunctivae normal.  Cardiovascular:     Rate and Rhythm: Normal rate and regular rhythm.     Heart sounds: No murmur heard. Pulmonary:     Effort: Pulmonary effort is normal. No respiratory distress.     Breath sounds: Normal breath sounds.  Abdominal:     Palpations: Abdomen is soft.     Tenderness: There is no abdominal tenderness. There is no guarding or rebound.  Musculoskeletal:        General: No swelling.     Cervical back: Neck supple.     Right lower leg: Edema present.     Left  lower leg: Edema present.     Comments: Symmetric 2+ lower extremity edema.  Palpable DP and PT pulses  Skin:    General: Skin is warm and dry.     Capillary Refill: Capillary refill takes less than 2 seconds.  Neurological:     General: No focal deficit present.     Mental Status: She is alert. Mental status is at baseline.     Cranial Nerves: No cranial nerve deficit.     Sensory: No sensory deficit.     Motor: No weakness.  Psychiatric:        Mood and Affect: Mood normal.     ED Results /  Procedures / Treatments   Labs (all labs ordered are listed, but only abnormal results are displayed) Labs Reviewed  BASIC METABOLIC PANEL - Abnormal; Notable for the following components:      Result Value   Chloride 96 (*)    BUN 31 (*)    Creatinine, Ser 1.44 (*)    Calcium 8.8 (*)    GFR, Estimated 37 (*)    All other components within normal limits  URINALYSIS, ROUTINE W REFLEX MICROSCOPIC - Abnormal; Notable for the following components:   APPearance HAZY (*)    Nitrite POSITIVE (*)    Leukocytes,Ua LARGE (*)    Bacteria, UA MANY (*)    All other components within normal limits  URINE CULTURE  CBC  BRAIN NATRIURETIC PEPTIDE  TROPONIN I (HIGH SENSITIVITY)    EKG EKG Interpretation Date/Time:  Monday August 21 2022 12:16:10 EDT Ventricular Rate:  102 PR Interval:  160 QRS Duration:  56 QT Interval:  374 QTC Calculation: 487 R Axis:   35  Text Interpretation: Sinus rhythm with frequent Premature ventricular complexes Low voltage QRS No significant change since last tracing When compared with ECG of 08-Jul-2022 15:05, PREVIOUS ECG IS PRESENT Confirmed by Fulton Reek [phone]) on 08/21/2022 3:12:56 PM  Radiology DG Chest 2 View  Result Date: 08/21/2022 CLINICAL DATA:  Chest pain EXAM: CHEST - 2 VIEW COMPARISON:  Chest x-ray 07/08/2022 FINDINGS: The heart size and mediastinal contours are within normal limits. Both lungs are clear. The visualized skeletal structures are  unremarkable. IMPRESSION: No active cardiopulmonary disease. Electronically Signed   By: Darliss Cheney M.D.   On: 08/21/2022 15:26    Procedures Procedures    Medications Ordered in ED Medications  HYDROcodone-acetaminophen (NORCO/VICODIN) 5-325 MG per tablet 1 tablet (1 tablet Oral Given 08/21/22 1745)  cefTRIAXone (ROCEPHIN) 1 g in sodium chloride 0.9 % 100 mL IVPB (0 g Intravenous Stopped 08/21/22 1940)    ED Course/ Medical Decision Making/ A&P Clinical Course as of 08/21/22 2319  Mon Aug 21, 2022  1512 EKG no significant change [JD]    Clinical Course User Index [JD] Laurence Spates, MD                                 Medical Decision Making Amount and/or Complexity of Data Reviewed Labs: ordered. Radiology: ordered.  Risk Prescription drug management.   Medical Decision Making:   ELONI DOOMS is a 82 y.o. female who presented to the ED today with episode of shortness of breath, recent leg swelling.  All signs reviewed.  Exam she is well-appearing.  She has history of dementia and is at her baseline neurologic status per daughter.  She does have some lower extremity edema which has been subacute and is not acutely worsened.  No signs of infection, she is neurovascularly intact.  She is no asymmetric edema or history of DVT or PE, low sufficient for DVT at this time.  Suspect could be venous stasis.  She is no history of heart failure, BNP here is normal as well.  She had some increased work of breathing earlier today, chest x-ray here does not show any signs pneumonia, pneumothorax or pulmonary edema.  She is normal work of breathing here without wheezing.  Low suspicion for PE given no hypoxia, chest pain, shortness of breath.  Lab work here is otherwise reassuring.  He has no chest pain, troponin normal low concern for ACS.  Daughter does report history of UTIs and given her recent hallucinations will evaluate for UTI.   Patient placed on continuous vitals and telemetry  monitoring while in ED which was reviewed periodically.  Reviewed and confirmed nursing documentation for past medical history, family history, social history.  Initial Study Results:   Laboratory  All laboratory results reviewed.  Labs notable for UTI, creatinine slightly elevated.  EKG EKG was reviewed independently. Rate, rhythm, axis, intervals all examined and without medically relevant abnormality. ST segments without concerns for elevations.    Radiology:  All images reviewed independently.  Chest x-ray unremarkable agree with radiology report at this time.    Reassessment and Plan:   On reassessment she remains stable.  No chest pain or shortness of breath.  Normal work of breathing without hypoxia.  Your workup is notable for UTI, she was given Rocephin and will treat with course of cefdinir.  Given she is at her baseline with resolution of her other symptoms I think she is stable for outpatient management.  Recommend she follow-up closely with her PCP.  Daughter is comfortable this plan.  Discharged in stable condition.   Patient's presentation is most consistent with acute complicated illness / injury requiring diagnostic workup.           Final Clinical Impression(s) / ED Diagnoses Final diagnoses:  Cystitis    Rx / DC Orders ED Discharge Orders          Ordered    cefdinir (OMNICEF) 300 MG capsule  Daily        08/21/22 2012              Laurence Spates, MD 08/21/22 [phone]

## 2022-08-21 NOTE — Telephone Encounter (Signed)
[name] DPR signed request that I call [facility] to let them be aware pt will be at ED in about 30 mins by car due to SOB and Wheezing, inhaler and neb treatment has not helped. I spoke with Rosine Door at [facility] ED triage to let her be aware pt was on her way by car with SOB and wheezing. And Lorana voiced understanding and pt will be triaged upon arrival at ED. UC & ED precautions given and [name] voiced understanding, sending note to Dr Para March and Para March pool.as FYI.

## 2022-08-21 NOTE — Telephone Encounter (Signed)
FYI: This call has been transferred to Access Nurse. Once the result note has been entered staff can address the message at that time.  Patient called in with the following symptoms:  Red Word:dizziness  and trouble breathing patient wears oxygen also has a lot of wheezing ,  and out of breath.      Please advise at Mobile [phone] (mobile)  Message is routed to Provider Pool and [facility] Triage

## 2022-08-21 NOTE — ED Provider Triage Note (Signed)
Emergency Medicine Provider Triage Evaluation Note  RALEY KALLER , a 82 y.o. female  was evaluated in triage.  Patient history of COPD on albuterol inhalers presents with concern for increased wheezing over the past week and increased bilateral lower extremity edema.  Daughter at bedside states the patient has dementia and she has noticed more noisy breathing in the past week that has not responded well to the albuterol inhalers.  She increased Lasix dose from 20 mg daily to 40 mg daily but still has not noticed more lower extremity edema.  Denies any chest pain, shortness of breath, arm pain, nausea or vomiting.  Denies any fevers or chills  Review of Systems  Positive: As above Negative: As above  Physical Exam  BP (!) 140/77 (BP Location: Right Arm)   Pulse 67   Temp 98.1 F (36.7 C)   Resp 18   Ht 4\' 10"  (1.473 m)   Wt 64 kg   SpO2 90%   BMI 29.49 kg/m  Gen:   Awake, no distress   Resp:  Normal effort  MSK:   Moves extremities without difficulty  Other:  Regular rate and rhythm on cardiac auscultation, lungs clear to auscultation bilaterally  Medical Decision Making  Medically screening exam initiated at 12:38 PM.  Appropriate orders placed.  Bubba Hales was informed that the remainder of the evaluation will be completed by another provider, this initial triage assessment does not replace that evaluation, and the importance of remaining in the ED until their evaluation is complete.     Arabella Merles, PA-C 08/21/22 1240

## 2022-08-21 NOTE — ED Triage Notes (Signed)
Pt's daughter states the home health aide told her pt needed to see her dr, because she was wheezing and her feet are very swollen. Pt's daughter states she has been giving pt tow lasix for a week and feet are still swollen. Pt has 3+ edema bilat. Pt is eupneic.

## 2022-08-22 DIAGNOSIS — J432 Centrilobular emphysema: Secondary | ICD-10-CM | POA: Diagnosis not present

## 2022-08-22 DIAGNOSIS — J9611 Chronic respiratory failure with hypoxia: Secondary | ICD-10-CM | POA: Diagnosis not present

## 2022-08-23 LAB — URINE CULTURE: Culture: >=100000 — AB

## 2022-08-24 ENCOUNTER — Telehealth (HOSPITAL_BASED_OUTPATIENT_CLINIC_OR_DEPARTMENT_OTHER): Payer: Self-pay

## 2022-08-24 NOTE — Telephone Encounter (Signed)
Post ED Visit - Positive Culture Follow-up  Culture report reviewed by antimicrobial stewardship pharmacist: [facility] [x]  [place], [place].D. []  Celedonio Miyamoto, Pharm.D., BCPS AQ-ID []  Garvin Fila, Pharm.D., BCPS []  Georgina Pillion, Pharm.D., BCPS []  [place], [address].D., BCPS, AAHIVP []  Estella Husk, Pharm.D., BCPS, AAHIVP []  Lysle Pearl, PharmD, BCPS []  Phillips Climes, PharmD, BCPS []  Agapito Games, PharmD, BCPS []  Verlan Friends, PharmD []  Mervyn Gay, PharmD, BCPS []  Vinnie Level, PharmD  [facility] []  Len Childs, PharmD []  Greer Pickerel, PharmD []  Adalberto Cole, PharmD []  Perlie Gold, Rph []  Lonell Face) Jean Rosenthal, PharmD []  Earl Many, PharmD []  Junita Push, PharmD []  Dorna Leitz, PharmD []  Terrilee Files, PharmD []  Lynann Beaver, PharmD []  Keturah Barre, PharmD []  Loralee Pacas, PharmD []  Bernadene Person, PharmD   Positive urine culture Treated with Cefdinir, organism sensitive to the same and no further patient follow-up is required at this time.  Sandria Senter 08/24/2022, 8:53 AM

## 2022-08-25 ENCOUNTER — Telehealth: Payer: Self-pay | Admitting: *Deleted

## 2022-08-25 NOTE — Telephone Encounter (Signed)
Transition Care Management Unsuccessful Follow-up Telephone Call  Date of discharge and from where:  [facility]  08/21/2022  Attempts:  1st Attempt  Reason for unsuccessful TCM follow-up call:  No answer/busy

## 2022-08-28 ENCOUNTER — Ambulatory Visit: Payer: Medicare HMO | Admitting: Family Medicine

## 2022-08-29 DIAGNOSIS — J9611 Chronic respiratory failure with hypoxia: Secondary | ICD-10-CM | POA: Diagnosis not present

## 2022-09-03 ENCOUNTER — Other Ambulatory Visit: Payer: Self-pay | Admitting: Family Medicine

## 2022-09-09 DIAGNOSIS — J9611 Chronic respiratory failure with hypoxia: Secondary | ICD-10-CM | POA: Diagnosis not present

## 2022-09-11 ENCOUNTER — Encounter: Payer: Self-pay | Admitting: Family Medicine

## 2022-09-11 NOTE — Telephone Encounter (Signed)
Patient last seen in office by you in March. Ok to set up for office visit to fill out FL2

## 2022-09-12 NOTE — Telephone Encounter (Signed)
Please start an FL 2 and set up a video visit.  Thanks.

## 2022-09-16 ENCOUNTER — Other Ambulatory Visit: Payer: Self-pay | Admitting: Pulmonary Disease

## 2022-09-19 ENCOUNTER — Encounter: Payer: Self-pay | Admitting: Family Medicine

## 2022-09-19 ENCOUNTER — Ambulatory Visit (INDEPENDENT_AMBULATORY_CARE_PROVIDER_SITE_OTHER): Payer: Medicare HMO | Admitting: Family Medicine

## 2022-09-19 VITALS — BP 118/60 | HR 80 | Temp 97.6°F | Ht <= 58 in | Wt 142.0 lb

## 2022-09-19 DIAGNOSIS — Z87440 Personal history of urinary (tract) infections: Secondary | ICD-10-CM | POA: Diagnosis not present

## 2022-09-19 DIAGNOSIS — R413 Other amnesia: Secondary | ICD-10-CM

## 2022-09-19 MED ORDER — FUROSEMIDE 20 MG PO TABS: ORAL_TABLET | ORAL | Status: DC

## 2022-09-19 NOTE — Patient Instructions (Addendum)
Let me know when I need to do the FL2 papers.   We'll work on the wheelchair order and [employer] with social work.   Ucx today.  Take care.  Glad to see you.

## 2022-09-19 NOTE — Progress Notes (Addendum)
Here with daughter.  Discussed her situation.  D/w pt about FL2 and potential placement.  It would not make sense to sign off on the FL 2 yet since she does not have placement pending/arranged.  Daughter would need to start bed search.  Discussed.  Daughter needs information through social work re: Nutritional therapist.    Patient wants to stay with daughter but needs excalating care.  D/w pt about placement vs home care.  Pt can't stay with her son since he is out of the house working.    Prev UCx d/w pt.  No dysuria now.  No fevers.   She is sundowning per family report, following family through the house.  Family is trying to reorient her.  Off O2 at the OV.  Wandering cautions d/w pt and daughter.  She can't get dressed or bathe alone. She needs help with meds.    She needs a lightweight/transport wheelchair.  She can't push a regular wheelchair and family can't either.  Dx COPD, chronic back pain.  The patient cannot use a walker, cane, or crutch to do activities of daily living in the home. The patient would require a light manual wheelchair to do these activities. The patient has a willing and able caregiver to assist them with the light weight wheelchair.   Palliative care has seen patient at home.    Meds, vitals, and allergies reviewed.   ROS: Per HPI unless specifically indicated in ROS section   Nad Ncat Off O2 at time of office visit. Neck supple, no LA Rrr Ctab Abdomen soft.  Nontender. Extremities well-perfused.  30 minutes were devoted to patient care in this encounter (this includes time spent reviewing the patient's file/history, interviewing and examining the patient, counseling/reviewing plan with patient).

## 2022-09-20 ENCOUNTER — Telehealth: Payer: Self-pay | Admitting: Family Medicine

## 2022-09-20 DIAGNOSIS — Z87440 Personal history of urinary (tract) infections: Secondary | ICD-10-CM | POA: Insufficient documentation

## 2022-09-20 DIAGNOSIS — G8929 Other chronic pain: Secondary | ICD-10-CM

## 2022-09-20 DIAGNOSIS — J449 Chronic obstructive pulmonary disease, unspecified: Secondary | ICD-10-CM

## 2022-09-20 HISTORY — DX: Personal history of urinary (tract) infections: Z87.440

## 2022-09-20 NOTE — Telephone Encounter (Signed)
She needs a lightweight/transport wheelchair.  She can't push a regular wheelchair and family can't either.  Dx COPD, chronic back pain.    Please send DME order.

## 2022-09-20 NOTE — Assessment & Plan Note (Signed)
Needs escalating care.  Refer for social work regarding information related to bed search.  Discussed with daughter.  Will hold FL 2 for now.  We can sign this in the future if needed.  See phone note regarding wheelchair order.  Wandering cautions discussed.  See notes on urine culture.  Daughter is continually reorienting patient.  Continue Namenda for now.

## 2022-09-20 NOTE — Assessment & Plan Note (Signed)
Recheck urine culture today.  Unclear if possible UTI is contributing to recent memory changes.

## 2022-09-21 ENCOUNTER — Telehealth: Payer: Self-pay | Admitting: *Deleted

## 2022-09-21 NOTE — Progress Notes (Signed)
Care Coordination  Outreach Note  09/21/2022 Name: Megan Peterson MRN: [id number] DOB: 1940/06/11   Care Coordination Outreach Attempts: An unsuccessful telephone outreach was attempted today to offer the patient information about available care coordination services.  Follow Up Plan:  Additional outreach attempts will be made to offer the patient care coordination information and services.   Encounter Outcome:  No Answer  Burman Nieves, CCMA Care Coordination Care Guide Direct Dial: [phone]

## 2022-09-22 DIAGNOSIS — J432 Centrilobular emphysema: Secondary | ICD-10-CM | POA: Diagnosis not present

## 2022-09-22 DIAGNOSIS — J9611 Chronic respiratory failure with hypoxia: Secondary | ICD-10-CM | POA: Diagnosis not present

## 2022-09-22 NOTE — Telephone Encounter (Signed)
Order done and message sent to [employer] staff that order was placed.

## 2022-09-22 NOTE — Addendum Note (Signed)
Addended by: Wendie Simmer B on: 09/22/2022 11:19 AM   Modules accepted: Orders

## 2022-09-22 NOTE — Progress Notes (Signed)
Care Coordination  Outreach Note  09/22/2022 Name: Megan Peterson MRN: [id number] DOB: [birth year]   Care Coordination Outreach Attempts: A second unsuccessful outreach was attempted today to offer the patient with information about available care coordination services.  Follow Up Plan:  Additional outreach attempts will be made to offer the patient care coordination information and services.   Encounter Outcome:  No Answer  Burman Nieves, CCMA Care Coordination Care Guide Direct Dial: [phone]

## 2022-09-22 NOTE — Progress Notes (Signed)
Care Coordination   Note   09/22/2022 Name: AISLEEN BONET MRN: [id number] DOB: 05-May-1940  STELLAR PENFOLD is a 82 y.o. year old female who sees Joaquim Nam, MD for primary care. I reached out to Bubba Hales by phone today to offer care coordination services.  Ms. Haapala was given information about Care Coordination services today including:   The Care Coordination services include support from the care team which includes your Nurse Coordinator, Clinical Social Worker, or Pharmacist.  The Care Coordination team is here to help remove barriers to the health concerns and goals most important to you. Care Coordination services are voluntary, and the patient may decline or stop services at any time by request to their care team member.   Care Coordination Consent Status: Patient agreed to services and verbal consent obtained.   Follow up plan:  Telephone appointment with care coordination team member scheduled for:  09/27/2022  Encounter Outcome:  Patient Scheduled from referral   Burman Nieves, [facility] Care Coordination Care Guide Direct Dial: [phone]

## 2022-09-25 ENCOUNTER — Other Ambulatory Visit: Payer: Self-pay

## 2022-09-25 DIAGNOSIS — Z87440 Personal history of urinary (tract) infections: Secondary | ICD-10-CM

## 2022-09-25 NOTE — Addendum Note (Signed)
Addended by: Wendie Simmer B on: 09/25/2022 11:49 AM   Modules accepted: Orders

## 2022-09-26 LAB — URINE CULTURE
MICRO NUMBER:: 15502318
SPECIMEN QUALITY:: ADEQUATE

## 2022-09-26 NOTE — Addendum Note (Signed)
Addended by: Wendie Simmer B on: 09/26/2022 02:31 PM   Modules accepted: Orders

## 2022-09-27 ENCOUNTER — Ambulatory Visit: Payer: Self-pay | Admitting: *Deleted

## 2022-09-27 ENCOUNTER — Other Ambulatory Visit: Payer: Self-pay | Admitting: Family Medicine

## 2022-09-27 MED ORDER — HYDROCODONE-ACETAMINOPHEN 5-325 MG PO TABS: 1.0000 | ORAL_TABLET | Freq: Four times a day (QID) | ORAL | 0 refills | Status: DC | PRN

## 2022-09-27 NOTE — Patient Instructions (Signed)
Visit Information  Thank you for taking time to visit with me today. Please don't hesitate to contact me if I can be of assistance to you.   Following are the goals we discussed today:   Goals Addressed               This Visit's Progress     caregiver support resources. (pt-stated)        Activities and task to complete in order to accomplish goals.   CSW to  place a referral with [facility] [place] for respite care they will contact you to discuss program further.  CSW will mail medicaid application to patient's daughter to complete for special assistance medicaid.  Assessment for hospice and palliative care previously scheduled for 09/28/22.          Our next appointment is by telephone on 10/11/22 at 10am  Please call the care guide team at [phone] if you need to cancel or reschedule your appointment.   If you are experiencing a Mental Health or Behavioral Health Crisis or need someone to talk to, please call 911   Patient verbalizes understanding of instructions and care plan provided today and agrees to view in MyChart. Active MyChart status and patient understanding of how to access instructions and care plan via MyChart confirmed with patient.     Telephone follow up appointment with care management team member scheduled for:10/11/22  Alik Mawson, LCSW [place]  [facility], [facility] Licensed Clinical Social Worker Care Coordinator  Direct Dial: [phone]

## 2022-09-27 NOTE — Telephone Encounter (Signed)
Sent. Thanks.

## 2022-09-27 NOTE — Patient Outreach (Addendum)
Care Coordination   Initial Visit Note   09/27/2022 Name: Megan Peterson MRN: [id number] DOB: 06-04-1940  Megan Peterson is a 82 y.o. year old female who sees Joaquim Nam, MD for primary care. I spoke with  Megan Peterson' daughter Megan Peterson by phone today.  What matters to the patients health and wellness today?  Pt daughter requesting assistance with community resources for respite and long-term care.     Goals Addressed               This Visit's Progress     caregiver support resources. (pt-stated)        Activities and task to complete in order to accomplish goals.   CSW to  place a referral with [facility] [place] for resp[facility] care they will contact you to discuss program further.  CSW will mail medicaid application to patient's daughter to complete for special assistance medicaid.  Assessment for hospice and palliative care previously scheduled for 09/28/22.          SDOH assessments and interventions completed:  Yes  SDOH Interventions Today    Flowsheet Row Most Recent Value  SDOH Interventions   Food Insecurity Interventions Intervention Not Indicated  Transportation Interventions Intervention Not Indicated, Patient Resources (Friends/Family)  Utilities Interventions Intervention Not Indicated  Social Connections Interventions Other (Comment)  [day programs offered]        Care Coordination Interventions:  Yes, provided  Interventions Today    Flowsheet Row Most Recent Value  Chronic Disease   Chronic disease during today's visit Other  [memory loss-caregiver stress]  General Interventions   General Interventions Discussed/Reviewed [employer], Level of Care, General Interventions Discussed  [cognitive deficits discussed-options for care-[facility] scheduled to evaluate patient on 09/28/22]  Doctor Visits Discussed/Reviewed Specialist  Level of Care Adult Daycare, Assisted Living, Applications  [adult day care/respite  /assisted living options discussed]  Applications Medicaid  Education Interventions   Education Provided Provided Education  [provided education on medicaid eligiblity for memory care]  Provided Verbal Education On [employer]  [[facility] [place]-respite care program discussed as well as medicaid eligibilty for long term care]  Barrister's clerk Interventions   Safety Discussed/Reviewed Home Safety, Safety Discussed, Safety Reviewed  [safety gate at bottom of stairs/Blink camera to monitor safety]       Follow up plan: Follow up call scheduled for 10/11/22     Encounter Outcome:  Patient Visit Completed

## 2022-09-27 NOTE — Telephone Encounter (Signed)
Last OV: 09/19/2022 Pending OV: Nothing scheduled at this time Medication Hydrocodone-Acetaminophen 5-325 Directions: 1 tablet q6h prn Last Refill: 08/20/2022 Qty: #120 with 0 refills

## 2022-09-28 ENCOUNTER — Encounter: Payer: Self-pay | Admitting: *Deleted

## 2022-09-28 ENCOUNTER — Other Ambulatory Visit: Payer: Self-pay | Admitting: Family Medicine

## 2022-09-28 NOTE — Patient Outreach (Signed)
Care Coordination   Collaboration  Visit Note   09/28/2022 Name: TALYSSA GARBERS MRN: [id number] DOB: 1940/05/19  ANAJAH GIAMPIETRO is a 82 y.o. year old female who sees Joaquim Nam, MD for primary care. I  spoke with [facility]  What matters to the patients health and wellness today?  Respite care options    Goals Addressed               This Visit's Progress     caregiver support resources. (pt-stated)        Activities and task to complete in order to accomplish goals.   Patient's daughter to contact [facility] [place] [phone] ext [phone] to complete referral for respite care  CSW will mail medicaid application to patient's daughter to complete for special assistance medicaid.  Assessment for hospice and palliative care previously scheduled for 09/28/22.          SDOH assessments and interventions completed:  No     Care Coordination Interventions:  Yes, provided   Follow up plan: Follow up call scheduled for 10/11/22    Encounter Outcome:  Patient Visit Completed

## 2022-09-29 DIAGNOSIS — J9611 Chronic respiratory failure with hypoxia: Secondary | ICD-10-CM | POA: Diagnosis not present

## 2022-10-02 ENCOUNTER — Telehealth: Payer: Self-pay | Admitting: Pulmonary Disease

## 2022-10-02 NOTE — Telephone Encounter (Signed)
Patient is having trouble with her breathing at night/early morning. Her oxygen levels are stable. Please call daughter at [phone]

## 2022-10-03 NOTE — Telephone Encounter (Signed)
Patient is still having shortness of breath. [facility] is reaching out on behalf of daughter to see what can be done. Please call back daughter.

## 2022-10-04 DIAGNOSIS — J449 Chronic obstructive pulmonary disease, unspecified: Secondary | ICD-10-CM | POA: Diagnosis not present

## 2022-10-04 DIAGNOSIS — G8929 Other chronic pain: Secondary | ICD-10-CM | POA: Diagnosis not present

## 2022-10-04 DIAGNOSIS — F02811 Dementia in other diseases classified elsewhere, unspecified severity, with agitation: Secondary | ICD-10-CM | POA: Diagnosis not present

## 2022-10-04 DIAGNOSIS — G301 Alzheimer's disease with late onset: Secondary | ICD-10-CM | POA: Diagnosis not present

## 2022-10-06 ENCOUNTER — Ambulatory Visit (INDEPENDENT_AMBULATORY_CARE_PROVIDER_SITE_OTHER): Payer: Medicare HMO | Admitting: Family Medicine

## 2022-10-06 ENCOUNTER — Encounter: Payer: Self-pay | Admitting: Family Medicine

## 2022-10-06 ENCOUNTER — Encounter: Payer: Self-pay | Admitting: *Deleted

## 2022-10-06 VITALS — BP 116/68 | HR 74 | Temp 98.0°F | Ht <= 58 in

## 2022-10-06 DIAGNOSIS — R0609 Other forms of dyspnea: Secondary | ICD-10-CM

## 2022-10-06 DIAGNOSIS — J22 Unspecified acute lower respiratory infection: Secondary | ICD-10-CM

## 2022-10-06 HISTORY — DX: Unspecified acute lower respiratory infection: J22

## 2022-10-06 MED ORDER — PREDNISONE 10 MG PO TABS
10.0000 mg | ORAL_TABLET | Freq: Two times a day (BID) | ORAL | 0 refills | Status: AC
Start: 2022-10-06 — End: 2022-10-13

## 2022-10-06 MED ORDER — AMOXICILLIN-POT CLAVULANATE 875-125 MG PO TABS: 1.0000 | ORAL_TABLET | Freq: Two times a day (BID) | ORAL | 0 refills | Status: DC

## 2022-10-06 NOTE — Telephone Encounter (Signed)
ATC patient's daughter x1.  No answer.  VM Full.  Mychart message sent for daughter to call [place] office.

## 2022-10-06 NOTE — Progress Notes (Signed)
Established Patient Office Visit   Subjective:  Patient ID: Megan Peterson, female    DOB: 1940/04/20  Age: 82 y.o. MRN: [id number]  Chief Complaint  Patient presents with   Breathing Problem    Pt complains of difficulty breathing. Pt has a PCP Dr. Para March and Pulmonologist.     Breathing Problem She complains of cough, difficulty breathing, shortness of breath, sputum production and wheezing. Pertinent negatives include no fever or myalgias.   Encounter Diagnoses  Name Primary?   Lower respiratory tract infection Yes   DOE (dyspnea on exertion)    Presents with a 4 to 5-day history of shortness of breath with worsening dyspnea on exertion.  Difficult for her to leave her wheelchair and walk to the bathroom without becoming short of breath.  With this she is no longer arguing about whether or not she should use her supplemental oxygen.  O2 sats has been running in the low 90s at home on 3 L.  Blood pressure has been stable but pulse rate was up yesterday.  Cough is productive of some phlegm.  She has been wheezy.  She uses albuterol nebulizers   Review of Systems  Constitutional: Negative.  Negative for chills and fever.  HENT: Negative.    Eyes:  Negative for blurred vision, discharge and redness.  Respiratory:  Positive for cough, sputum production, shortness of breath and wheezing.   Cardiovascular: Negative.   Gastrointestinal:  Negative for abdominal pain.  Genitourinary: Negative.   Musculoskeletal: Negative.  Negative for joint pain and myalgias.  Skin:  Negative for rash.  Neurological:  Negative for tingling, loss of consciousness and weakness.  Endo/Heme/Allergies:  Negative for polydipsia.     Current Outpatient Medications:    albuterol (ACCUNEB) 0.63 MG/3ML nebulizer solution, USE 1 VIAL IN NEBULIZER EVERY 6 HOURS AS NEEDED FOR WHEEZING, Disp: 150 mL, Rfl: 3   albuterol (VENTOLIN HFA) 108 (90 Base) MCG/ACT inhaler, INHALE 1 TO 2 PUFFS BY MOUTH EVERY 6 HOURS AS  NEEDED FOR WHEEZING FOR SHORTNESS OF BREATH, Disp: 9 g, Rfl: 0   amoxicillin-clavulanate (AUGMENTIN) 875-125 MG tablet, Take 1 tablet by mouth 2 (two) times daily., Disp: 20 tablet, Rfl: 0   fluticasone (FLOVENT HFA) 110 MCG/ACT inhaler, Inhale 2 puffs into the lungs 2 (two) times daily., Disp: 1 each, Rfl: 2   furosemide (LASIX) 20 MG tablet, TAKE 1 TABLET BY MOUTH ONCE DAILY AS NEEDED IN THE MORNING FOR FLUID, WITH 2ND DOSE IF NEEDED FOR FLUID, Disp: , Rfl:    HYDROcodone-acetaminophen (NORCO/VICODIN) 5-325 MG tablet, Take 1 tablet by mouth every 6 (six) hours as needed for moderate pain., Disp: 120 tablet, Rfl: 0   memantine (NAMENDA) 10 MG tablet, Take 1 tablet by mouth twice daily, Disp: 60 tablet, Rfl: 5   metoprolol succinate (TOPROL-XL) 25 MG 24 hr tablet, Take 1 tablet by mouth once daily, Disp: 90 tablet, Rfl: 1   nitroGLYCERIN (NITROSTAT) 0.4 MG SL tablet, Place 0.4 mg under the tongue every 5 (five) minutes as needed for chest pain (max 3 doses in 15 minutes.  if still having chest pain then go to the ER)., Disp: 50 tablet, Rfl: 3   nystatin (MYCOSTATIN/NYSTOP) powder, Apply 1 Application topically 3 (three) times daily., Disp: 30 g, Rfl: 3   OXYGEN, Inhale 2 L/min into the lungs as needed (for shortness of breath)., Disp: , Rfl:    polyethylene glycol (MIRALAX / GLYCOLAX) 17 g packet, Take 17 g by mouth daily as needed for  mild constipation., Disp: 14 each, Rfl: 0   predniSONE (DELTASONE) 10 MG tablet, Take 1 tablet (10 mg total) by mouth 2 (two) times daily with a meal for 7 days., Disp: 14 tablet, Rfl: 0   umeclidinium-vilanterol (ANORO ELLIPTA) 62.5-25 MCG/ACT AEPB, Inhale 1 puff by mouth once daily, Disp: 60 each, Rfl: 5   vitamin B-12 (CYANOCOBALAMIN) 1000 MCG tablet, Take 1 tablet (1,000 mcg total) by mouth daily., Disp: , Rfl:    gabapentin (NEURONTIN) 400 MG capsule, Take 1 capsule (400 mg total) by mouth 3 (three) times daily., Disp: 90 capsule, Rfl: 0   Objective:     BP  116/68   Pulse 74   Temp 98 F (36.7 C)   Ht 4\' 10"  (1.473 m)   SpO2 91% Comment: 3L of o2  BMI 29.68 kg/m    Physical Exam Constitutional:      General: She is not in acute distress.    Appearance: Normal appearance. She is not ill-appearing, toxic-appearing or diaphoretic.  HENT:     Head: Normocephalic and atraumatic.     Right Ear: External ear normal.     Left Ear: External ear normal.     Mouth/Throat:     Mouth: Mucous membranes are moist.     Pharynx: Oropharynx is clear. No oropharyngeal exudate or posterior oropharyngeal erythema.  Eyes:     General: No scleral icterus.       Right eye: No discharge.        Left eye: No discharge.     Extraocular Movements: Extraocular movements intact.     Conjunctiva/sclera: Conjunctivae normal.     Pupils: Pupils are equal, round, and reactive to light.  Cardiovascular:     Rate and Rhythm: Normal rate and regular rhythm.  Pulmonary:     Effort: Pulmonary effort is normal. No respiratory distress.     Breath sounds: Examination of the right-lower field reveals rales. Examination of the left-lower field reveals rales. Rales present. No wheezing.  Abdominal:     General: Bowel sounds are normal.     Tenderness: There is no abdominal tenderness. There is no guarding.  Musculoskeletal:     Cervical back: No rigidity or tenderness.  Skin:    General: Skin is warm and dry.  Neurological:     Mental Status: She is alert and oriented to person, place, and time.  Psychiatric:        Mood and Affect: Mood normal.        Behavior: Behavior normal.      No results found for any visits on 10/06/22.    The ASCVD Risk score (Arnett DK, et al., 2019) failed to calculate for the following reasons:   The 2019 ASCVD risk score is only valid for ages 59 to 13    Assessment & Plan:   Lower respiratory tract infection -     Amoxicillin-Pot Clavulanate; Take 1 tablet by mouth 2 (two) times daily.  Dispense: 20 tablet; Refill: 0 -      predniSONE; Take 1 tablet (10 mg total) by mouth 2 (two) times daily with a meal for 7 days.  Dispense: 14 tablet; Refill: 0  DOE (dyspnea on exertion) -     predniSONE; Take 1 tablet (10 mg total) by mouth 2 (two) times daily with a meal for 7 days.  Dispense: 14 tablet; Refill: 0    Return Please proceed to emergency room if worsening.Mliss Sax, MD

## 2022-10-09 DIAGNOSIS — J9611 Chronic respiratory failure with hypoxia: Secondary | ICD-10-CM | POA: Diagnosis not present

## 2022-10-09 NOTE — Telephone Encounter (Signed)
Attempted to reach patient daughter no answer or voicemail.  Mychart message sent to have family/patient reach out to [place].

## 2022-10-10 ENCOUNTER — Ambulatory Visit: Payer: Medicare Other | Admitting: Physician Assistant

## 2022-10-11 ENCOUNTER — Ambulatory Visit: Payer: Self-pay | Admitting: *Deleted

## 2022-10-11 NOTE — Patient Instructions (Signed)
Visit Information  Thank you for taking time to visit with me today. Please don't hesitate to contact me if I can be of assistance to you.   Following are the goals we discussed today:   Goals Addressed               This Visit's Progress     caregiver support resources. (pt-stated)        Activities and task to complete in order to accomplish goals.   Patient's daughter to contact [facility] [phone] to follow up on status of  referral for respite care  Patient's daughter to follow up with referral to the [facility] program and status of medicaid application Continue follow up with  palliative care.          Our next appointment is by telephone on 10/25/22 at 10am  Please call the care guide team at [phone] if you need to cancel or reschedule your appointment.   If you are experiencing a Mental Health or Behavioral Health Crisis or need someone to talk to, please call 911   Patient verbalizes understanding of instructions and care plan provided today and agrees to view in MyChart. Active MyChart status and patient understanding of how to access instructions and care plan via MyChart confirmed with patient.     Telephone follow up appointment with care management team member scheduled for: 19/23/24  Verna Czech, LCSW [place]  [facility], [facility] Licensed Clinical Social Worker Care Coordinator  Direct Dial: [phone]

## 2022-10-11 NOTE — Patient Outreach (Signed)
  Care Coordination   Follow Up Visit Note   10/11/2022 Name: Megan Peterson MRN: [id number] DOB: 10-Oct-1940  Megan Peterson is a 81 y.o. year old female who sees Joaquim Nam, MD for primary care. I spoke with  Megan Peterson's daughter by phone today.  What matters to the patients health and wellness today? Patient's daughter requesting resources for respite care. Patient 's daughter considering the [facility] program and has initiated a medicaid application through this program   Goals Addressed               This Visit's Progress     caregiver support resources. (pt-stated)        Activities and task to complete in order to accomplish goals.   Patient's daughter to contact [facility] [phone] ext [phone] to follow up on status of  referral for respite care  Patient's daughter to follow up with referral to the [facility] program and status of medicaid application Continue follow up with  palliative care.          SDOH assessments and interventions completed:  No     Care Coordination Interventions:  Yes, provided  Interventions Today    Flowsheet Row Most Recent Value  Chronic Disease   Chronic disease during today's visit Other  [memory loss, caregiver support]  General Interventions   General Interventions Discussed/Reviewed General Interventions Reviewed, [employer] care options discussed-voicemail left with [facility] [place] to complete referral-confirmed that patient is now Community education officer services through Palliative care]  Doctor Visits Discussed/Reviewed Doctor Visits Reviewed  Level of Care Adult Daycare, Air traffic controller  [patient"s daughter confirms visitig the [facility] program yesterday-Medicaid application initciated while there]  Applications Medicaid  [Medicaid determination pending]  Education Interventions   Education Provided Provided Education  Provided Verbal Education On [employer]  [discussed [facility] program  and strategies to [employer] to increase participation]  Applications Medicaid  [Medicaid determination pending]       Follow up plan: Follow up call scheduled for 10/25/22    Encounter Outcome:  Patient Visit Completed

## 2022-10-12 NOTE — Telephone Encounter (Signed)
Spoke with the daughter. She states that she continuously checks her oxygen levels and its always in the 90s.She doesn't wear oxygen when up and about she comes up with a million reasons why she can't. It was at 91% while on the phone. She states that the patient states that her shirt is choking her when its not. She states that the patient complained of choking before even taking any pills this am. Nothing seems to help. They do not have an ENT. Please advise as the daughter wants her seen ASAP.    Please make appointment with APP, next available. If this is too far out then she will have to follow-up with her PCP for sooner appointment

## 2022-10-13 ENCOUNTER — Encounter (HOSPITAL_BASED_OUTPATIENT_CLINIC_OR_DEPARTMENT_OTHER): Payer: Self-pay | Admitting: Pulmonary Disease

## 2022-10-16 NOTE — Telephone Encounter (Signed)
See other encounter.

## 2022-10-19 ENCOUNTER — Other Ambulatory Visit: Payer: Self-pay | Admitting: Family Medicine

## 2022-10-19 NOTE — Telephone Encounter (Signed)
Called spoke to daughter on dpr states mom does not need at this time thinks that there is some around the house she will take a look and let us know if not. Patient is not having any new or increased symptoms.

## 2022-10-22 DIAGNOSIS — J9611 Chronic respiratory failure with hypoxia: Secondary | ICD-10-CM | POA: Diagnosis not present

## 2022-10-22 DIAGNOSIS — J432 Centrilobular emphysema: Secondary | ICD-10-CM | POA: Diagnosis not present

## 2022-10-24 ENCOUNTER — Other Ambulatory Visit: Payer: Self-pay | Admitting: Family Medicine

## 2022-10-24 ENCOUNTER — Encounter: Payer: Self-pay | Admitting: Family Medicine

## 2022-10-25 ENCOUNTER — Ambulatory Visit: Payer: Self-pay | Admitting: *Deleted

## 2022-10-25 NOTE — Patient Outreach (Signed)
  Care Coordination   Follow Up Visit Note   10/25/2022 Name: Megan Peterson MRN: [id number] DOB: Jun 22, 1940  Megan Peterson is a 82 y.o. year old female who sees Joaquim Nam, MD for primary care. I spoke with  Bubba Hales by phone today.  What matters to the patients health and wellness today?  Patient's daughter requesting assistance with respite care options-collaborative phone call to [facility] who confirmed  that their respite program has a long waiting list(at least 2025) Left voicemail message for [facility] for referral for their respite care program [phone]. Patient's daughter to continue with admissions process for the [facility] program   Goals Addressed               This Visit's Progress     caregiver support resources. (pt-stated)        Activities and task to complete in order to accomplish goals.   Patient's daughter to follow up with referral to the [facility] program and status of medicaid application Continue follow up with the  palliative care program.          SDOH assessments and interventions completed:  No     Care Coordination Interventions:  Yes, provided  Interventions Today    Flowsheet Row Most Recent Value  Chronic Disease   Chronic disease during today's visit Other  [memory loss, caregiver support]  General Interventions   General Interventions Discussed/Reviewed General Interventions Reviewed, [employer], Level of Care, Communication with  Communication with --  Ship broker with [facility](Nicole) confirmed that app for Medicaid  started, will need outstanding paperwork-Nicole will contact pt's daughter today at 12pm to follow up-[facility] [place] contacted-re respite care-current wait list to 2025]  Level of Care Adult Daycare, Applications  [Confirmed that Pt's daughter continues with the admissions process for the [facility] program-]  Applications Medicaid  [Confirmed that the [facility]  program has initiated the medicaid application process-Medicaid determination pending]  Education Interventions   Applications Medicaid  [Confirmed that the [facility] program has initiated the medicaid application process-Medicaid determination pending]  Mental Health Interventions   Mental Health Discussed/Reviewed Mental Health Discussed  [caregiver stress acknowledged, self care emphasized, completion of [facility] program admissions process encouraged]       Follow up plan: Follow up call scheduled for 11/01/22    Encounter Outcome:  Patient Visit Completed

## 2022-10-25 NOTE — Telephone Encounter (Signed)
I spoke with [name] said pt had 2 episodes in last 2 wks that lasted about 30-45 mins.each time and EMS came out and said EKG looked ok and pt did not go to ED. Pt last had chest tightness in mid chest that radiated to both sides of lower jaws 3 days ago.. Pt was SOB and O2 helped the breathing. Pt will not wear O2 all the time. No sweating. [name] said pt is not having symptoms today and it has been several years since saw card and pt only wants to see Dr Para March . [name] scheduled appt with Dr Para March on 10/27/22 at 3 om with UC & ED precautions and [name] voiced understanding., sending note to Dr Para March who is out of office, Dr Reece Agar who is in office and [place] pool.

## 2022-10-25 NOTE — Patient Instructions (Signed)
Visit Information  Thank you for taking time to visit with me today. Please don't hesitate to contact me if I can be of assistance to you.   Following are the goals we discussed today:   Goals Addressed               This Visit's Progress     caregiver support resources. (pt-stated)        Activities and task to complete in order to accomplish goals.   Patient's daughter to follow up with referral to the [facility] program and status of medicaid application Continue follow up with the  palliative care program.          Our next appointment is by telephone on 11/01/22 at 1pm  Please call the care guide team at [phone] if you need to cancel or reschedule your appointment.   If you are experiencing a Mental Health or Behavioral Health Crisis or need someone to talk to, please call 911   Patient verbalizes understanding of instructions and care plan provided today and agrees to view in MyChart. Active MyChart status and patient understanding of how to access instructions and care plan via MyChart confirmed with patient.     Telephone follow up appointment with care management team member scheduled for: 11/01/22  Verna Czech, LCSW [place]  [facility], [facility] Licensed Clinical Social Worker Care Coordinator  Direct Dial: [phone]

## 2022-10-25 NOTE — Telephone Encounter (Signed)
Noted.  Agreed.  I think giving her ER cautions in the meantime make sense.  We can see her as scheduled.

## 2022-10-27 ENCOUNTER — Ambulatory Visit (INDEPENDENT_AMBULATORY_CARE_PROVIDER_SITE_OTHER): Payer: Medicare HMO | Admitting: Family Medicine

## 2022-10-27 ENCOUNTER — Encounter: Payer: Self-pay | Admitting: Family Medicine

## 2022-10-27 VITALS — BP 106/58 | HR 86 | Temp 98.2°F | Ht <= 58 in | Wt 145.1 lb

## 2022-10-27 DIAGNOSIS — R0789 Other chest pain: Secondary | ICD-10-CM | POA: Diagnosis not present

## 2022-10-27 DIAGNOSIS — J432 Centrilobular emphysema: Secondary | ICD-10-CM

## 2022-10-27 DIAGNOSIS — R072 Precordial pain: Secondary | ICD-10-CM | POA: Diagnosis not present

## 2022-10-27 LAB — TROPONIN I: Troponin I: 3 ng/L (ref <=47)

## 2022-10-27 MED ORDER — NITROGLYCERIN 0.4 MG SL SUBL: 0.4000 mg | SUBLINGUAL_TABLET | SUBLINGUAL | 3 refills | Status: AC | PRN

## 2022-10-27 NOTE — Progress Notes (Unsigned)
Episode of chest pain, woke up with pain.  This was 10/22/22.  At rest, at home.  Had eaten earlier in the day.  Family was at home, patient told family about chest pain.  Had jaw pain at the time.  Used NTG, called hospice.  Checked BP, was 80s/50-70s.  Took 3 doses of nitroglycerin, EMS arrived.  Took 4 of the 81mg  aspirin tabs.    Pain resolved after about 45 minutes to 1 hour.  NTG helped some but didn't resolve the pain totally.  Getting back on O2 seemed to help.  Wasn't on O2 initially.  Was able to get to sleep after EMS left.   Then chest was sore the next day.  Similar pain.  No NTG use.  No complaints of pain the last few days.    "I feel fine right now."  Taking 1-2 lasix per day.  Still with BLE edema.   No CP.  Not SOB.    Meds, vitals, and allergies reviewed.   ROS: Per HPI unless specifically indicated in ROS section   1+ BLE edema.    Long acting nitrate? Inc lasix?

## 2022-10-27 NOTE — Patient Instructions (Addendum)
Go to the lab on the way out.   If you have mychart we'll likely use that to update you.    Use nitroglycerin if needed- if chest pain.  Use your O2 in the meantime.  If not better with NTG use then call 911.    Keep taking 1 lasix a day for now.  Take care.  Glad to see you.

## 2022-10-28 LAB — CBC WITH DIFFERENTIAL/PLATELET
Absolute Lymphocytes: 3010 {cells}/uL (ref 850–3900)
Absolute Monocytes: 1081 {cells}/uL — ABNORMAL HIGH (ref 200–950)
Basophils Absolute: 91 {cells}/uL (ref 0–200)
Basophils Relative: 0.9 %
Eosinophils Absolute: 162 {cells}/uL (ref 15–500)
Eosinophils Relative: 1.6 %
HCT: 39.5 % (ref 35.0–45.0)
Hemoglobin: 13.2 g/dL (ref 11.7–15.5)
MCH: 31.5 pg (ref 27.0–33.0)
MCHC: 33.4 g/dL (ref 32.0–36.0)
MCV: 94.3 fL (ref 80.0–100.0)
MPV: 11.3 fL (ref 7.5–12.5)
Monocytes Relative: 10.7 %
Neutro Abs: 5757 {cells}/uL (ref 1500–7800)
Neutrophils Relative %: 57 %
Platelets: 228 10*3/uL (ref 140–400)
RBC: 4.19 10*6/uL (ref 3.80–5.10)
RDW: 12 % (ref 11.0–15.0)
Total Lymphocyte: 29.8 %
WBC: 10.1 10*3/uL (ref 3.8–10.8)

## 2022-10-28 LAB — COMPREHENSIVE METABOLIC PANEL
AG Ratio: 1.3 (calc) (ref 1.0–2.5)
ALT: 6 U/L (ref 6–29)
AST: 15 U/L (ref 10–35)
Albumin: 3.7 g/dL (ref 3.6–5.1)
Alkaline phosphatase (APISO): 98 U/L (ref 37–153)
BUN/Creatinine Ratio: 17 (calc) (ref 6–22)
BUN: 24 mg/dL (ref 7–25)
CO2: 35 mmol/L — ABNORMAL HIGH (ref 20–32)
Calcium: 9.2 mg/dL (ref 8.6–10.4)
Chloride: 98 mmol/L (ref 98–110)
Creat: 1.4 mg/dL — ABNORMAL HIGH (ref 0.60–0.95)
Globulin: 2.8 g/dL (ref 1.9–3.7)
Glucose, Bld: 99 mg/dL (ref 65–99)
Potassium: 5.2 mmol/L (ref 3.5–5.3)
Sodium: 141 mmol/L (ref 135–146)
Total Bilirubin: 0.6 mg/dL (ref 0.2–1.2)
Total Protein: 6.5 g/dL (ref 6.1–8.1)

## 2022-10-29 DIAGNOSIS — J9611 Chronic respiratory failure with hypoxia: Secondary | ICD-10-CM | POA: Diagnosis not present

## 2022-10-29 NOTE — Assessment & Plan Note (Signed)
Discussed restarting O2 use as this could have contributed to the symptoms above.

## 2022-10-29 NOTE — Assessment & Plan Note (Signed)
History of, none currently.  Discussed using oxygen at baseline.  Check troponin and labs today.  At this point still okay for outpatient follow-up but routine chest pain/nitroglycerin cautions given to patient and daughter.  Would continue with 1 Lasix per day for now.  See notes on labs.  Depending on her blood pressure and Lasix use she may end up benefiting from addition of long-acting nitrate.

## 2022-10-30 ENCOUNTER — Other Ambulatory Visit: Payer: Self-pay | Admitting: Family Medicine

## 2022-10-30 MED ORDER — HYDROXYZINE HCL 10 MG PO TABS: 10.0000 mg | ORAL_TABLET | Freq: Three times a day (TID) | ORAL | 2 refills | Status: DC | PRN

## 2022-10-30 MED ORDER — FUROSEMIDE 20 MG PO TABS: ORAL_TABLET | ORAL | Status: DC

## 2022-10-31 ENCOUNTER — Telehealth: Payer: Self-pay

## 2022-10-31 ENCOUNTER — Other Ambulatory Visit: Payer: Self-pay | Admitting: Family Medicine

## 2022-10-31 NOTE — Telephone Encounter (Signed)
Megan Peterson(DPR signed) was to cb today with update; Megan Peterson noticed more swelling in feet on 10/30/22; on 10/31/22 Megan Peterson noticed swelling bilateral lower extremities with pink splotches on rt lower leg. No pain in feet or legs. Both legs itching and pt is scratching skin or scabs off but this is no worse than usual.Megan Peterson said there are white spots on lower  extremities but Megan Peterson said could be dry skin pt has taken Lasix 20 mg in morning and afternoon on 10/30/22 and 10/31/22.  Pt has no CP and SOB is no worse than usual.pt has no swelling in abd and abd is not hard to the touch. Pt has problems keeping feet and legs elevated when sitting. Pt does not like to wear the Oxygen also., pt is not confused and does not seem in any distress at this time. Pt and Megan Peterson are visiting someone at a facility and BP now is 132/62 P 90.  I spoke with Dr Para March. Dr Para March said on 11/01/22 pt to take three Lasix 20 mg  each tab in the morning for 1 day only. Pt needs to prop feet up while sitting or laying today and tonight. Use oxygen, get weights at home daily if on 11/02/22 swelling is better can go back to two tabs of Lasix 20 mg and if swelling no better pt needs to be rechecked.Megan Peterson will cb with update and further instructions., UC & ED precautions given and Megan Peterson voiced understanding. Sending note to Dr Para March.

## 2022-11-01 ENCOUNTER — Telehealth: Payer: Self-pay | Admitting: Family Medicine

## 2022-11-01 ENCOUNTER — Other Ambulatory Visit (HOSPITAL_COMMUNITY): Payer: Self-pay

## 2022-11-01 ENCOUNTER — Ambulatory Visit: Payer: Self-pay | Admitting: *Deleted

## 2022-11-01 NOTE — Patient Outreach (Signed)
  Care Coordination   Follow Up Visit Note   11/01/2022 Name: Megan Peterson MRN: [id number] DOB: 13-Sep-1940  Megan Peterson is a 82 y.o. year old female who sees Joaquim Nam, MD for primary care. I spoke with  Megan Peterson by phone today.  What matters to the patients health and wellness today?  Respite care and day program options-specifically the PACE program. Enrollment in process following approval of Medicaid.  Patient's daughter actively working with the PACE program to completed Medicaid process. Patient's daughter confirms having no additional needs at this time.    Goals Addressed               This Visit's Progress     caregiver support resources. (pt-stated)        Activities and task to complete in order to accomplish goals.   Patient's daughter to continue to  follow up with referral to the PACE program and status of medicaid application Continue follow up with the  palliative care program.          SDOH assessments and interventions completed:  No     Care Coordination Interventions:  Yes, provided  Interventions Today    Flowsheet Row Most Recent Value  Chronic Disease   Chronic disease during today's visit Other  [memory lossm caregiver support]  General Interventions   General Interventions Discussed/Reviewed General Interventions Reviewed, Level of Care  [swelling feet, legs-MD recommended elevation- lasix given as recommended]  Level of Care Personal Care Services  [facility] program enrollement remains in progress, working on completing medicaid application for admittance-]  Applications Medicaid  [employer] program representative assisting with Medicaid application submission]  Education Interventions   Applications Medicaid  [employer] program representative assisting with Medicaid application submission]  Mental Health Interventions   Mental Health Discussed/Reviewed --  Teacher, English as a foreign language stress continues to be acknowledged, self care  emphasized as well as completion  of Medicaid process for pt's enrollment in the PACE program]       Follow up plan: No further intervention required.   Encounter Outcome:  Patient Visit Completed

## 2022-11-01 NOTE — Telephone Encounter (Signed)
Agree. Thanks

## 2022-11-01 NOTE — Telephone Encounter (Signed)
Megan Peterson (DPR signed) called with update; pt did keep her feet up yesterday evening and all night last night. The swelling in lower legs and feet are some better. No more SOB than usual and no CP. Pt did use O2 last night on 2 L. Pt just waking up and Megan Peterson is going to give pt lasix 60 mg this morning as one time dose only. Pt will keep legs elevated and use oxygen as much as pt will allow. UC & ED precautions given and Megan Peterson voiced understanding. Megan Peterson will cb on 11/02/22 with update; sooner if needed. Sending note as FYI to Dr Para March.

## 2022-11-01 NOTE — Telephone Encounter (Signed)
[facility] billing called on pt's behalf, asking if a no show fee can be removed off pt's chart? Billing states the date of the visit was 08/28/22. Billing states the pt explained how her daughter handles her appts & during the time of that specific appt, pt's daughter was in the hosp, so the pt couldn't attend the appt. Can the fee be removed? Call back # [phone].

## 2022-11-01 NOTE — Patient Instructions (Signed)
Visit Information  Thank you for taking time to visit with me today. Please don't hesitate to contact me if I can be of assistance to you.   Following are the goals we discussed today:   Goals Addressed               This Visit's Progress     caregiver support resources. (pt-stated)        Activities and task to complete in order to accomplish goals.   Patient's daughter to continue to  follow up with referral to the PACE program and status of medicaid application Continue follow up with the  palliative care program.          If you are experiencing a Mental Health or Behavioral Health Crisis or need someone to talk to, please call 911   Patient verbalizes understanding of instructions and care plan provided today and agrees to view in MyChart. Active MyChart status and patient understanding of how to access instructions and care plan via MyChart confirmed with patient.     No further follow up required: patient's daughter to contact this Child psychotherapist with any additional community resource needs  [employer] [place]  [facility], [facility] Licensed Clinical Social Geologist, engineering Dial: [phone]

## 2022-11-02 ENCOUNTER — Other Ambulatory Visit (HOSPITAL_COMMUNITY): Payer: Self-pay

## 2022-11-02 ENCOUNTER — Telehealth: Payer: Self-pay | Admitting: Pharmacy Technician

## 2022-11-02 NOTE — Telephone Encounter (Signed)
Pharmacy Patient Advocate Encounter  Received notification from [employer] that Prior Authorization for hydroxyzine has been APPROVED from 01/02/22 to 01/02/23. Ran test claim, Copay is $0.33. This test claim was processed through [facility]- copay amounts may vary at other pharmacies due to pharmacy/plan contracts, or as the patient moves through the different stages of their insurance plan.   PA #/Case ID/Reference #: [id number]

## 2022-11-03 ENCOUNTER — Telehealth: Payer: Self-pay | Admitting: Family Medicine

## 2022-11-03 NOTE — Telephone Encounter (Signed)
Called and spoke with patients daughter [name]. Did advise that the recommendation is to take patient to urgent care given symptoms and that it is a Friday. She said that her mom is not acting herself and again reiterated that due to the symptoms to go to urgent care and they can do a UA as well.

## 2022-11-03 NOTE — Telephone Encounter (Signed)
Pt daughter Megan Peterson  called in and stated she this her mother has a uti I ask if she would like to come in and she said no can someone call something in for her mom she ask me I told her Para March will be back Monday Megan Peterson said they don't want a appt they want to leave.Megan Peterson can be reached at  [phone]

## 2022-11-03 NOTE — Telephone Encounter (Signed)
Routing to covering provider and PCP. Please see previous message.

## 2022-11-03 NOTE — Telephone Encounter (Signed)
Not going to prescribe an antibiotic without being seen since Dr. Para March is out of office

## 2022-11-07 ENCOUNTER — Emergency Department: Payer: Medicare HMO

## 2022-11-07 ENCOUNTER — Emergency Department
Admission: EM | Admit: 2022-11-07 | Discharge: 2022-11-07 | Disposition: A | Discharge status: Home / Self Care | Payer: Medicare HMO | Attending: Emergency Medicine | Admitting: Emergency Medicine

## 2022-11-07 ENCOUNTER — Encounter: Payer: Self-pay | Admitting: Emergency Medicine

## 2022-11-07 ENCOUNTER — Other Ambulatory Visit: Payer: Self-pay

## 2022-11-07 ENCOUNTER — Telehealth: Payer: Self-pay | Admitting: Family Medicine

## 2022-11-07 DIAGNOSIS — Z1152 Encounter for screening for COVID-19: Secondary | ICD-10-CM | POA: Diagnosis not present

## 2022-11-07 DIAGNOSIS — J449 Chronic obstructive pulmonary disease, unspecified: Secondary | ICD-10-CM | POA: Insufficient documentation

## 2022-11-07 DIAGNOSIS — W19XXXA Unspecified fall, initial encounter: Secondary | ICD-10-CM

## 2022-11-07 DIAGNOSIS — R531 Weakness: Secondary | ICD-10-CM | POA: Diagnosis not present

## 2022-11-07 DIAGNOSIS — G319 Degenerative disease of nervous system, unspecified: Secondary | ICD-10-CM | POA: Diagnosis not present

## 2022-11-07 DIAGNOSIS — F0390 Unspecified dementia without behavioral disturbance: Secondary | ICD-10-CM | POA: Insufficient documentation

## 2022-11-07 DIAGNOSIS — M79602 Pain in left arm: Secondary | ICD-10-CM | POA: Insufficient documentation

## 2022-11-07 DIAGNOSIS — E042 Nontoxic multinodular goiter: Secondary | ICD-10-CM | POA: Diagnosis not present

## 2022-11-07 DIAGNOSIS — N3000 Acute cystitis without hematuria: Secondary | ICD-10-CM | POA: Diagnosis not present

## 2022-11-07 DIAGNOSIS — S0093XA Contusion of unspecified part of head, initial encounter: Secondary | ICD-10-CM | POA: Insufficient documentation

## 2022-11-07 DIAGNOSIS — I672 Cerebral atherosclerosis: Secondary | ICD-10-CM | POA: Diagnosis not present

## 2022-11-07 LAB — TROPONIN I (HIGH SENSITIVITY): Troponin I (High Sensitivity): 5 ng/L (ref <18)

## 2022-11-07 LAB — URINALYSIS, COMPLETE (UACMP) WITH MICROSCOPIC
Bilirubin Urine: NEGATIVE
Glucose, UA: NEGATIVE mg/dL
Ketones, ur: NEGATIVE mg/dL
Nitrite: NEGATIVE
Protein, ur: NEGATIVE mg/dL
Specific Gravity, Urine: 1.01 (ref 1.005–1.030)
pH: 7 (ref 5.0–8.0)

## 2022-11-07 LAB — COMPREHENSIVE METABOLIC PANEL
ALT: 10 U/L (ref 0–44)
AST: 20 U/L (ref 15–41)
Albumin: 3.3 g/dL — ABNORMAL LOW (ref 3.5–5.0)
Alkaline Phosphatase: 90 U/L (ref 38–126)
Anion gap: 8 (ref 5–15)
BUN: 28 mg/dL — ABNORMAL HIGH (ref 8–23)
CO2: 29 mmol/L (ref 22–32)
Calcium: 8.4 mg/dL — ABNORMAL LOW (ref 8.9–10.3)
Chloride: 102 mmol/L (ref 98–111)
Creatinine, Ser: 1.35 mg/dL — ABNORMAL HIGH (ref 0.44–1.00)
GFR, Estimated: 39 mL/min — ABNORMAL LOW (ref >60)
Glucose, Bld: 95 mg/dL (ref 70–99)
Potassium: 4.5 mmol/L (ref 3.5–5.1)
Sodium: 139 mmol/L (ref 135–145)
Total Bilirubin: 0.8 mg/dL (ref <1.2)
Total Protein: 6.5 g/dL (ref 6.5–8.1)

## 2022-11-07 LAB — CBC
HCT: 35.9 % — ABNORMAL LOW (ref 36.0–46.0)
Hemoglobin: 12 g/dL (ref 12.0–15.0)
MCH: 32 pg (ref 26.0–34.0)
MCHC: 33.4 g/dL (ref 30.0–36.0)
MCV: 95.7 fL (ref 80.0–100.0)
Platelets: 279 10*3/uL (ref 150–400)
RBC: 3.75 MIL/uL — ABNORMAL LOW (ref 3.87–5.11)
RDW: 12.1 % (ref 11.5–15.5)
WBC: 7.8 10*3/uL (ref 4.0–10.5)
nRBC: 0 % (ref 0.0–0.2)

## 2022-11-07 LAB — RESP PANEL BY RT-PCR (RSV, FLU A&B, COVID)  RVPGX2
Influenza A by PCR: NEGATIVE
Influenza B by PCR: NEGATIVE
Resp Syncytial Virus by PCR: NEGATIVE
SARS Coronavirus 2 by RT PCR: NEGATIVE

## 2022-11-07 MED ORDER — CEFPODOXIME PROXETIL 200 MG PO TABS: 200.0000 mg | ORAL_TABLET | Freq: Two times a day (BID) | ORAL | 0 refills | Status: DC

## 2022-11-07 MED ORDER — CEPHALEXIN 500 MG PO CAPS: 500.0000 mg | ORAL_CAPSULE | Freq: Two times a day (BID) | ORAL | 0 refills | Status: AC

## 2022-11-07 MED ORDER — CEFTRIAXONE SODIUM 1 G IJ SOLR
1.0000 g | Freq: Once | INTRAMUSCULAR | Status: AC
  Administered 2022-11-07: 1 g via INTRAVENOUS
  Filled 2022-11-07: qty 10

## 2022-11-07 NOTE — ED Triage Notes (Signed)
Pt daughter sts that pt has been having multiple falls recently. Pt stst that she is having left arm pain.

## 2022-11-07 NOTE — ED Notes (Signed)
See triage note  Presents with family s/p fall  Daughter states she went to the bathroom  and she fell from the toilet  Hitting her head on the closet  No LOC  But family is concerned because she has been weak lately

## 2022-11-07 NOTE — Telephone Encounter (Signed)
Pte and pts daughter were on speaker phone and notified as instructed and pt said she does not want to go to ED. Megan Peterson said pt is in her gown and robe and does not want to get washed up to go. I explained pt does not have to bathe before going. Megan Peterson is going to talk with her mom and try to get her to go to ED. If pt continues to refuse to go to ED Megan Peterson will cb and schedule early appt with Dr Alphonsus Sias on 11/08/22. UC & ED precautions given and pt and pts daughter voiced understanding. Sending FYI to Dr Para March who is out of office, Dr Sharen Hones and Dr Alphonsus Sias.

## 2022-11-07 NOTE — Telephone Encounter (Signed)
Patient daughter called in and stated that she had fell in the bathroom yesterday. She stated that she hit her forehead on the closet but there isn't a bruise anything. She was wanting to know if she should bring her in, what to watch for, or what she should do. Please advise. Sending to Dr. Reece Agar since Dr. Para March is out of office. Thank you!

## 2022-11-07 NOTE — Telephone Encounter (Signed)
I spoke with [name] (DPR signed) on 11/03/22 and 11/04/22 pt was not able to walk by herself; legs extremely wobbly; pts feet did not want to move.pt could not walk with out walker and someone else helping her also. Pt was also not acting like herself on 11/03/22 and 11/04/22. On 11/06/22 pt fell in bathroom and [name] was not home so no one saw her fall; pt hit head on closet and [name] is not sure if pt lost consciousness or not. Pt is walking little better today. No H/A. [name] is not sure about dizziness pt does not have good balance. No available appts at [facility] and pt may need to go to ED for eval and possible imaging. [name] said "hell no; I'm not going to go sit for hours." [name] began to cry and said that her family does not help and she cannot do it all. [name] wants to try to keep her mom at home . 11/07/22 pt was up earlier and got to the bathroom but pt is not picking her feet up. [name] said pt does not want to go to [facility] or ED. [name] request cb after Dr Reece Agar reviews note. Will send note to Dr Reece Agar who is in office, G pool and will speak with Misty Stanley CMA.

## 2022-11-07 NOTE — ED Provider Notes (Signed)
[facility] Provider Note    Event Date/Time   First MD Initiated Contact with Patient 11/07/22 1330     (approximate)  History   Chief Complaint: Fall  HPI  Megan Peterson is a 82 y.o. female with a past medical history of anxiety, COPD, dementia, presents to the emergency department for weakness and a fall.  According to the daughter patient is on palliative care for end-stage COPD supposed to be on home oxygen but states she rarely wears it.  Daughter is noticed over the last for 5 days intermittently the patient has been acting more weak.  She states this occurs sometimes when the patient has urinary tract infections.  She called her doctor but they wanted her to be seen instead of calling her in an antibiotic.  Then last night when the patient went to the restroom she was found down on the bathroom floor after apparently having a fall.  Patient denies any pain.  Daughter states she was complaining of some left arm pain coming into the hospital however no longer is.  Great range of motion in the emergency department without pain elicited.  Daughter states she did have some bruising noted to the head and was concerned that she hit her head.  Here the patient is awake alert she has no complaints and states she is ready to go home.   Physical Exam   Triage Vital Signs: ED Triage Vitals  Encounter Vitals Group     BP 11/07/22 1256 115/66     Systolic BP Percentile --      Diastolic BP Percentile --      Pulse Rate 11/07/22 1256 74     Resp 11/07/22 1256 18     Temp 11/07/22 1256 98.2 F (36.8 C)     Temp Source 11/07/22 1256 Oral     SpO2 11/07/22 1256 94 %     Weight 11/07/22 1257 145 lb 2 oz (65.8 kg)     Height 11/07/22 1257 4\' 10"  (1.473 m)     Head Circumference --      Peak Flow --      Pain Score 11/07/22 1257 6     Pain Loc --      Pain Education --      Exclude from Growth Chart --     Most recent vital signs: Vitals:   11/07/22 1256  BP:  115/66  Pulse: 74  Resp: 18  Temp: 98.2 F (36.8 C)  SpO2: 94%    General: Awake, no distress.  CV:  Good peripheral perfusion.  Regular rate and rhythm  Resp:  Normal effort.  Equal breath sounds bilaterally.  Abd:  No distention.  Soft, nontender.  No rebound or guarding. Other:  Good range of motion in all extremities no apparent discomfort.   ED Results / Procedures / Treatments   EKG  EKG viewed and inter by myself shows a normal sinus rhythm at 68 bpm the narrow QRS, normal axis, normal intervals, no concerning ST changes.  RADIOLOGY  CT head/neck pending   MEDICATIONS ORDERED IN ED: Medications - No data to display   IMPRESSION / MDM / ASSESSMENT AND PLAN / ED COURSE  I reviewed the triage vital signs and the nursing notes.  Patient's presentation is most consistent with acute presentation with potential threat to life or bodily function.  Patient presents emergency department for increased weakness over the last for 5 days.  Daughter states that has been  intermittent and today she is actually doing better than she has been.  She ambulates without the use of a walker although is supposed to use 1 per daughter.  Did have a fall last night.  Given the patient's intermittent weakness we will check labs as well as a urine sample as the daughter states often times there will be a urinary tract infection that causes her to act like this.  We also obtain a COVID swab and given the fall CT scans of the head and C-spine.  Will continue to closely monitor.  Patient herself has no complaints and is asking when she will be ready to go home.  Patient's lab work is overall reassuring reassuring CBC, reassuring chemistry troponin is negative.  Patient's EKG shows no concerning findings.  Urinalysis, COVID, CT head and C-spine pending.  Anticipate discharge home if no significant findings.  Patient care signed out to oncoming provider  FINAL CLINICAL IMPRESSION(S) / ED DIAGNOSES    Fall Weakness  Note:  This document was prepared using Dragon voice recognition software and may include unintentional dictation errors.   Minna Antis, MD 11/07/22 1452

## 2022-11-07 NOTE — Progress Notes (Signed)
[facility] Liaison note:   This patient is currently enrolled in [facility] outpatient-based palliative care.  [facility] will continue to follow for discharge disposition.    Please call for any outpatient based palliative care related questions or concerns.   [facility] Liaison [phone]

## 2022-11-07 NOTE — ED Notes (Signed)
First nurse note: Pt to ED with daughter POV, helped out of car. Has been weak since last week and had stroke workup with PCP this past week, fell yesterday onto L arm and c/o L arm pain. Pt is alert, oriented and can stand and pivot with assistance. Wears 2L at baseline.

## 2022-11-07 NOTE — Telephone Encounter (Signed)
Agree with recommendation for ER evaluation given recent AMS, fall with head injury. However as patient/family refuse ER evaluation then recommend office visit for evaluation with next available provider. Patient is not on blood thinners.

## 2022-11-07 NOTE — ED Provider Notes (Signed)
-----------------------------------------   3:11 PM on 11/07/2022 -----------------------------------------  Blood pressure 115/66, pulse 74, temperature 98.2 F (36.8 C), temperature source Oral, resp. rate 18, height 4\' 10"  (1.473 m), weight 65.8 kg, SpO2 94%.  Assuming care from Dr. Lenard Lance.  In short, Megan Peterson is a 82 y.o. female with a chief complaint of Fall .  Refer to the original H&P for additional details.  The current plan of care is to follow-up CT results and UA.  ----------------------------------------- 6:34 PM on 11/07/2022 ----------------------------------------- CT head and cervical spine are negative for acute process, urinalysis does appear concerning for infection which is likely the source of patient's recent falls.  She was given a dose of IV Rocephin here in the ED and urine was sent for culture.  Patient lives with her daughter, who is comfortable taking her home with course of oral antibiotics and close PCP follow-up.  Daughter counseled to have her return to the ED for new or worsening symptoms, patient and daughter agree with plan.    Chesley Noon, MD 11/07/22 [phone]

## 2022-11-07 NOTE — Telephone Encounter (Signed)
Per chart review tab pt did go to [facility] ED. Sending FYI to Dr Sharen Hones and Dr Alphonsus Sias.

## 2022-11-07 NOTE — Telephone Encounter (Signed)
Noted I can check her tomorrow morning if doesn't need the ER today for acute worsening

## 2022-11-08 NOTE — Telephone Encounter (Signed)
Noted. Thanks.

## 2022-11-08 NOTE — Telephone Encounter (Signed)
See ER note.  Thanks.

## 2022-11-08 NOTE — Telephone Encounter (Signed)
Seen and discharged Rx for likely UTI

## 2022-11-09 DIAGNOSIS — J9611 Chronic respiratory failure with hypoxia: Secondary | ICD-10-CM | POA: Diagnosis not present

## 2022-11-09 LAB — URINE CULTURE

## 2022-11-10 ENCOUNTER — Telehealth: Payer: Self-pay | Admitting: Family Medicine

## 2022-11-10 MED ORDER — HYDROXYZINE HCL 10 MG PO TABS: 10.0000 mg | ORAL_TABLET | Freq: Three times a day (TID) | ORAL | 2 refills | Status: AC | PRN

## 2022-11-10 MED ORDER — HYDROCODONE-ACETAMINOPHEN 5-325 MG PO TABS: 1.0000 | ORAL_TABLET | Freq: Four times a day (QID) | ORAL | 0 refills | Status: DC | PRN

## 2022-11-10 NOTE — Telephone Encounter (Signed)
Sent. Thanks.   

## 2022-11-10 NOTE — Addendum Note (Signed)
Addended by: Joaquim Nam on: 11/10/2022 02:07 PM   Modules accepted: Orders

## 2022-11-10 NOTE — Telephone Encounter (Signed)
Called and spoke with patients daughter to verify correct pharmacy is listed. Not sure what may have happened but I will resend prescription.Marland Kitchen

## 2022-11-10 NOTE — Addendum Note (Signed)
Addended by: Leonor Liv on: 11/10/2022 09:43 AM   Modules accepted: Orders

## 2022-11-10 NOTE — Telephone Encounter (Signed)
Prescription Request  11/10/2022  LOV: 10/27/2022  What is the name of the medication or equipment? HYDROcodone-acetaminophen (NORCO/VICODIN) 5-325 MG tablet   Have you contacted your pharmacy to request a refill? No   Which pharmacy would you like this sent to?  [facility] [address] ([address] [place] - [address] [place]) [place] [address] Phone: [phone] Fax: [phone]    Patient notified that their request is being sent to the clinical staff for review and that they should receive a response within 2 business days.   Please advise at [facility] [phone]

## 2022-11-10 NOTE — Telephone Encounter (Signed)
Patient daughter called in and stated that the pharmacy is stating they don't have a prescription for this medication. She was wanting to know if this could be sent over to them again. Thank you!

## 2022-11-13 ENCOUNTER — Telehealth: Payer: Self-pay | Admitting: Family Medicine

## 2022-11-13 NOTE — Telephone Encounter (Signed)
Megan Peterson from [facility] called in and stated that patient and her daughter would like her FL2 sent over to a nursing facility. She stated that it can be faxed over to [facility] at [phone]. Thank you!

## 2022-11-13 NOTE — Telephone Encounter (Signed)
Per 9/17 note you advised to let you know when they needed filled out. I have placed form in your box for review.

## 2022-11-14 MED ORDER — VITAMIN B-12 1000 MCG PO TABS
1000.0000 ug | ORAL_TABLET | Freq: Every day | ORAL | Status: AC
Start: 2022-11-14 — End: ?

## 2022-11-14 NOTE — Telephone Encounter (Signed)
Form done.  Please scan and send.  Thanks.  

## 2022-11-14 NOTE — Telephone Encounter (Signed)
Left message for Megan Peterson to return call in reference to forms that was sent earlier and to see if the facility received via fax.

## 2022-11-14 NOTE — Telephone Encounter (Signed)
Megan Peterson, Social worker with Hospice, called stating the facility that the [facility] was faxed to informed her that their fax system has been having issues lately. Huntley Dec asked if ppw could possibly be emailed to facility? Call back # [phone]

## 2022-11-14 NOTE — Telephone Encounter (Signed)
Document has been faxed and sent to the scan center

## 2022-11-14 NOTE — Addendum Note (Signed)
Addended by: Joaquim Nam on: 11/14/2022 06:43 AM   Modules accepted: Orders

## 2022-11-15 NOTE — Telephone Encounter (Signed)
[facility] called back to say that they have received the FL2

## 2022-11-15 NOTE — Telephone Encounter (Signed)
Reached out to Huntley Dec to advised that the paperwork has been refaxed to reach out to let me know whether or not it has not received.

## 2022-11-15 NOTE — Telephone Encounter (Signed)
Noted  

## 2022-11-15 NOTE — Telephone Encounter (Signed)
[name] returned call stating that the facility said that they still have not received FL2 and was wondering if it could be refaxed? [phone].

## 2022-11-17 ENCOUNTER — Telehealth: Payer: Self-pay | Admitting: Family Medicine

## 2022-11-17 ENCOUNTER — Encounter: Payer: Self-pay | Admitting: Family Medicine

## 2022-11-17 NOTE — Telephone Encounter (Signed)
Spoke with [name] paperwork has been faxed

## 2022-11-17 NOTE — Telephone Encounter (Signed)
Pt's daughter, [name], called asking if our office could fax over the pt's FL2 form, along with any recent progress notes from the pt's recent ov with Dr. Para March? [name] states ppw needs to be faxed to [phone], [facility]. Call back # [phone]

## 2022-11-22 ENCOUNTER — Other Ambulatory Visit (HOSPITAL_BASED_OUTPATIENT_CLINIC_OR_DEPARTMENT_OTHER): Payer: Self-pay

## 2022-11-22 ENCOUNTER — Encounter (HOSPITAL_BASED_OUTPATIENT_CLINIC_OR_DEPARTMENT_OTHER): Payer: Self-pay | Admitting: Pulmonary Disease

## 2022-11-22 ENCOUNTER — Ambulatory Visit (HOSPITAL_BASED_OUTPATIENT_CLINIC_OR_DEPARTMENT_OTHER): Payer: Medicare HMO | Admitting: Pulmonary Disease

## 2022-11-22 VITALS — BP 90/68 | HR 83 | Resp 14 | Ht <= 58 in | Wt 143.6 lb

## 2022-11-22 DIAGNOSIS — F02B3 Dementia in other diseases classified elsewhere, moderate, with mood disturbance: Secondary | ICD-10-CM | POA: Diagnosis not present

## 2022-11-22 DIAGNOSIS — J449 Chronic obstructive pulmonary disease, unspecified: Secondary | ICD-10-CM

## 2022-11-22 DIAGNOSIS — Z23 Encounter for immunization: Secondary | ICD-10-CM

## 2022-11-22 DIAGNOSIS — J432 Centrilobular emphysema: Secondary | ICD-10-CM

## 2022-11-22 MED ORDER — TRELEGY ELLIPTA 100-62.5-25 MCG/ACT IN AEPB: 1.0000 | INHALATION_SPRAY | Freq: Every day | RESPIRATORY_TRACT | Status: DC

## 2022-11-22 MED ORDER — OPTICHAMBER ADVANTAGE-LG MASK MISC
1.0000 | 0 refills | Status: AC | PRN
  Filled 2022-11-22: qty 1, 30d supply, fill #0
  Filled 2022-11-22: qty 1, fill #0

## 2022-11-22 NOTE — Patient Instructions (Signed)
X spacer  X flu shot today  X Sample & Rx for Trelegy 100 - 1puff daily INSTEAD of anoro

## 2022-11-22 NOTE — Progress Notes (Unsigned)
   Subjective:    Patient ID: Megan Peterson, female    DOB: 20-Aug-1940, 82 y.o.   MRN: [id number]  HPI  82 yo ex-smoker FU  for COPD, bronchiectasis and chronic respiratory failure on oxygen   PMH - chronic pain, alzheimer's  Chief Complaint  Patient presents with  . Follow-up    Asking for reevaluation. Feels like inhalers aren't helping anymore.      Significant tests/ events reviewed   08/11/2019-overnight oximetry on room air-duration of sleep 6 hours and 57 minutes, time spent below 88% 5 hours and 25 minutes, average SPO2 87, SpO2 low 61 >>start 2L    ONO on 2L 08/27/2019-duration of sleep 7 hours and 57 minutes, time spent below 88% 6 hours and 19 minutes, baseline oxygen level 87%, SpO2 low 69% >> increase to 4L during sleep   PFT  06/2016 that showed similar lung function (compared 2013) with moderate to severe airflow obstruction with an FEV1 at 55%, ratio 52, positive bronchodilator response, FVC 80%, DLCO 56%.   CT angiogram 04/21/16 was negative for pulmonary embolism but showed new bilateral upper lobe groundglass infiltrates with mild bronchiectasis in both lower lobes  Review of Systems     Objective:   Physical Exam        Assessment & Plan:

## 2022-11-23 ENCOUNTER — Telehealth: Payer: Self-pay

## 2022-11-23 ENCOUNTER — Encounter: Payer: Self-pay | Admitting: Family Medicine
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
## 2022-11-23 NOTE — Telephone Encounter (Signed)
Transition Care Management Unsuccessful Follow-up Telephone Call  Date of discharge and from where:  [place] 11/5   Attempts:  1st  Reason for unsuccessful TCM follow-up call:  No answer/busy   Lenard Forth Paulina  [facility], [facility] Guide, Phone: [phone] Website: [email or web address]

## 2022-11-24 ENCOUNTER — Other Ambulatory Visit: Payer: Self-pay | Admitting: Family Medicine

## 2022-11-24 ENCOUNTER — Encounter: Payer: Self-pay | Admitting: Family Medicine

## 2022-11-24 NOTE — Telephone Encounter (Signed)
error 

## 2022-11-24 NOTE — Telephone Encounter (Signed)
Megan Peterson would like to make sure skilled nursing box was checked on the [facility] form that are being sent out on behalf of patient. She would like a FL2 to be sent out to:  [facility] Fx #:[phone]   Make sure skilled nursing box is checked

## 2022-11-27 NOTE — Telephone Encounter (Signed)
Please scan and send the new FL2.  Thanks.

## 2022-11-29 DIAGNOSIS — J9611 Chronic respiratory failure with hypoxia: Secondary | ICD-10-CM | POA: Diagnosis not present

## 2022-12-01 ENCOUNTER — Encounter (HOSPITAL_BASED_OUTPATIENT_CLINIC_OR_DEPARTMENT_OTHER): Payer: Self-pay | Admitting: Pulmonary Disease

## 2022-12-04 ENCOUNTER — Other Ambulatory Visit (HOSPITAL_BASED_OUTPATIENT_CLINIC_OR_DEPARTMENT_OTHER): Payer: Self-pay

## 2022-12-04 DIAGNOSIS — J432 Centrilobular emphysema: Secondary | ICD-10-CM

## 2022-12-04 MED ORDER — TRELEGY ELLIPTA 100-62.5-25 MCG/ACT IN AEPB: 1.0000 | INHALATION_SPRAY | Freq: Every day | RESPIRATORY_TRACT | 2 refills | Status: AC

## 2022-12-09 DIAGNOSIS — J9611 Chronic respiratory failure with hypoxia: Secondary | ICD-10-CM | POA: Diagnosis not present

## 2022-12-21 ENCOUNTER — Other Ambulatory Visit: Payer: Self-pay | Admitting: Neurology

## 2022-12-29 DIAGNOSIS — J9611 Chronic respiratory failure with hypoxia: Secondary | ICD-10-CM | POA: Diagnosis not present

## 2023-01-01 ENCOUNTER — Other Ambulatory Visit: Payer: Self-pay | Admitting: Family Medicine

## 2023-01-01 NOTE — Telephone Encounter (Signed)
Copied from CRM [id number]. Topic: Clinical - Medication Refill >> Jan 01, 2023 10:18 AM Drema Balzarine wrote: Most Recent Primary Care Visit:  Provider: Joaquim Nam  Department: [facility]  Visit Type: OFFICE VISIT  Date: 10/27/2022  Medication: gabapentin 800mg  (smaller capsules or tablets) & HYDROcodone  Has the patient contacted their pharmacy? No  (Agent: If no, request that the patient contact the pharmacy for the refill. If patient does not wish to contact the pharmacy document the reason why and proceed with request.) (Agent: If yes, when and what did the pharmacy advise?)  Is this the correct pharmacy for this prescription? Yes If no, delete pharmacy and type the correct one.  This is the patient's preferred pharmacy:   [facility] [address] ([address]), [place] - [address] [place] ([place]) [place] [address] Phone: [phone] Fax: [phone]     Has the prescription been filled recently? Yes  Is the patient out of the medication? No, few more left   Has the patient been seen for an appointment in the last year OR does the patient have an upcoming appointment? Yes  Can we respond through MyChart? Yes  Agent: Please be advised that Rx refills may take up to 3 business days. We ask that you follow-up with your pharmacy.

## 2023-01-02 ENCOUNTER — Telehealth: Payer: Self-pay | Admitting: Family Medicine

## 2023-01-02 MED ORDER — GABAPENTIN 400 MG PO CAPS: 400.0000 mg | ORAL_CAPSULE | Freq: Three times a day (TID) | ORAL | 0 refills | Status: DC

## 2023-01-02 MED ORDER — FUROSEMIDE 20 MG PO TABS: ORAL_TABLET | ORAL | 2 refills | Status: DC

## 2023-01-02 MED ORDER — HYDROCODONE-ACETAMINOPHEN 5-325 MG PO TABS: 1.0000 | ORAL_TABLET | Freq: Four times a day (QID) | ORAL | 0 refills | Status: DC | PRN

## 2023-01-02 NOTE — Telephone Encounter (Signed)
 Sent gabapentin and hydrocodone.  Please verify current sertraline use/dose and let me know.  Thanks.

## 2023-01-02 NOTE — Telephone Encounter (Signed)
 Unable to reach patient. Left voicemail to return call to our office.

## 2023-01-02 NOTE — Telephone Encounter (Signed)
See Refill request.  

## 2023-01-02 NOTE — Telephone Encounter (Signed)
 Done. Thanks.

## 2023-01-02 NOTE — Telephone Encounter (Signed)
Called and spoke with [name], patients daughter. She states she does not take Zoloft as it made her drowsy.

## 2023-01-02 NOTE — Telephone Encounter (Signed)
Patients daughter requesting a refill on patients lasix as well. States she takes one every day and occasionally she will take two.

## 2023-01-02 NOTE — Telephone Encounter (Signed)
 Copied from CRM [id number]. Topic: General - Call Back - No Documentation >> Jan 02, 2023 11:29 AM Samuel Jester B wrote: Reason for CRM: Megan Peterson is requesting a callback after receiving a voicemail regarding a request order for the pt.

## 2023-01-05 ENCOUNTER — Telehealth: Payer: Self-pay

## 2023-01-05 NOTE — Telephone Encounter (Signed)
 Copied from CRM [id number]. Topic: Clinical - Prescription Issue >> Jan 05, 2023 12:13 PM Danika B wrote: Reason for CRM: Patients daughter Nathanel states pharmacy needs new pre-auth for rx HYDROcodone -acetaminophen  (NORCO/VICODIN) 5-325 MG tablet. Also stated that she previously requested notations to be added to mothers account regarding being unable to swallow large tablets and wanted to know if there were other options tablet wise for gabapentin . Callback [phone]

## 2023-01-05 NOTE — Telephone Encounter (Signed)
 Please start the PA for hydrocodone and check with pharmacy to see about smaller pill size for gabapentin.  Thanks.

## 2023-01-05 NOTE — Telephone Encounter (Signed)
 Please send prior authorization for HYDROcodone-acetaminophen (NORCO/VICODIN). Thank you

## 2023-01-05 NOTE — Telephone Encounter (Signed)
 Prior authorization has been sent for norco/vicodin. I also spoke with the pharmacy and they have no smaller size/alternative for the gabapentin . They were aware as to who I was speaking about and they did advise the patients daughter that since the medication is a capsule that they can twist it open and put the contents in something such as applesauce.

## 2023-01-07 NOTE — Telephone Encounter (Signed)
 Thanks for starting the PA.  I don't have a better option then opening the gabapentin capsules as described.

## 2023-01-08 NOTE — Telephone Encounter (Signed)
 Left message for [name] to return call to advise that the prior authorization has been started. But as for the gabapentin that there is no alternative to the pill form that since it is a capsule that it would need to be opened.

## 2023-01-09 ENCOUNTER — Telehealth: Payer: Self-pay

## 2023-01-09 NOTE — Telephone Encounter (Signed)
 Spoke with Megan Peterson and advised that he is not on the [facility] and therefore I am unable to provide him information

## 2023-01-09 NOTE — Telephone Encounter (Signed)
 Copied from CRM [id number]. Topic: Clinical - Medication Question >> Jan 09, 2023  4:06 PM Isabell A wrote: Reason for CRM: Ozell (son) calling to check the status of the prior authorization for HYDROcodone -acetaminophen  (NORCO/VICODIN). Ozell states patient will be staying with him & once the insurance has approved or denied the medication he would like a phone call to confirm which pharmacy to send to.    Callback number: [phone]

## 2023-01-10 ENCOUNTER — Other Ambulatory Visit: Payer: Self-pay | Admitting: Family Medicine

## 2023-01-10 ENCOUNTER — Telehealth: Payer: Self-pay | Admitting: Family Medicine

## 2023-01-10 NOTE — Telephone Encounter (Signed)
 Spoke with patient who gave me permisson to speak with son Megan Peterson. I did explain to Megan Peterson that it is imperative that he comes in with mom and update DPR to include him. I did advise that the norco/vicodin we are awaiting prior authorization once we receive that I will give him a call. In addition gave him the information to [facility] to see about [facility] care. Also explained that gabapentin  can be twisted to open to place in food since [name] is not able to swallow large tablets. Megan Peterson express how he feels overwhelmed will see if we can get him in contact with a child psychotherapist for some assistance.

## 2023-01-10 NOTE — Telephone Encounter (Signed)
 See previous message this has been addressed I prior note

## 2023-01-10 NOTE — Telephone Encounter (Signed)
 I did double check to make sure that there was not anything more that we can do to release information to Megan Peterson. Unfortunately there is not since he is not on the DPR. I did leave him a vm and I also left on the voicemail that he can call with [name] on the phone and she can give me the okay to speak with him for this circumstance but it is important that they come to the clinic and update the DPR so that we can also release information to him going forward in addition to [name]

## 2023-01-10 NOTE — Telephone Encounter (Signed)
 Copied from CRM [id number]. Topic: General - Other >> Jan 10, 2023  3:01 PM Macario HERO wrote: Reason for CRM: Patient son Ozell called requesting a call back for his mother Medicaid information to request a home health while his sister who is her caretaker is in the hospital. - Call back  [phone]   Patient's son is requesting a referral for [facility] services.

## 2023-01-10 NOTE — Telephone Encounter (Signed)
 Copied from CRM [id number]. Topic: General - Other >> Jan 10, 2023 11:42 AM Irine Seal wrote: Reason for CRM: Pts son Casimiro Needle returning missed call and stated he is needing to see about pts home health, his sister was providing care but she is in the hospital.

## 2023-01-10 NOTE — Telephone Encounter (Signed)
 Copied from CRM [id number]. Topic: Clinical - Medication Refill >> Jan 10, 2023  2:50 PM Macario HERO wrote: Most Recent Primary Care Visit:  Provider: CLEATUS ARLYSS RAMAN  Department: [facility]  Visit Type: OFFICE VISIT  Date: 10/27/2022  Medication: gabapentin  800mg  (smaller capsules or tablets) & HYDROcodone -acetaminophen  (NORCO/VICODIN) 5-325 MG tablet [[id number]]  Has the patient contacted their pharmacy? Yes (Agent: If no, request that the patient contact the pharmacy for the refill. If patient does not wish to contact the pharmacy document the reason why and proceed with request.) (Agent: If yes, when and what did the pharmacy advise?)  Is this the correct pharmacy for this prescription? Yes If no, delete pharmacy and type the correct one.  This is the patient's preferred pharmacy:  [facility]  [address], [place], [place] [address] [phone]    Has the prescription been filled recently? Yes  Is the patient out of the medication? Yes  Has the patient been seen for an appointment in the last year OR does the patient have an upcoming appointment? Yes  Can we respond through MyChart? Yes  Agent: Please be advised that Rx refills may take up to 3 business days. We ask that you follow-up with your pharmacy.

## 2023-01-11 MED ORDER — GABAPENTIN 400 MG PO CAPS: 400.0000 mg | ORAL_CAPSULE | Freq: Three times a day (TID) | ORAL | 2 refills | Status: AC

## 2023-01-11 MED ORDER — HYDROCODONE-ACETAMINOPHEN 5-325 MG PO TABS: 1.0000 | ORAL_TABLET | Freq: Four times a day (QID) | ORAL | 0 refills | Status: DC | PRN

## 2023-01-11 NOTE — Telephone Encounter (Signed)
 I resent both to: [facility]  [address] [place], [place] [address] [phone]   Please let me know when you hear about the PA on the hydrocodone rx.  Thanks.

## 2023-01-12 ENCOUNTER — Other Ambulatory Visit (HOSPITAL_COMMUNITY): Payer: Self-pay

## 2023-01-12 ENCOUNTER — Telehealth: Payer: Self-pay

## 2023-01-12 ENCOUNTER — Other Ambulatory Visit: Payer: Self-pay | Admitting: Family Medicine

## 2023-01-12 NOTE — Telephone Encounter (Signed)
 Spoke with patient who gave me the okay to speak with her son Ozell. Previously advised Ozell to reach out to [facility] at the time of the call today the patient has not reached out to them. He did speak with Camie Hun at [facility] who referred him to Lisa a child psychotherapist. The social worker is telling him he needs a FL2 form but he does not know who he wants to use. I did advise that unless we know where she is going unable to send the FL2.

## 2023-01-12 NOTE — Telephone Encounter (Signed)
 Checking on the status of this prior authorization.

## 2023-01-12 NOTE — Telephone Encounter (Signed)
 Does he need HH referral and a FL2?  Please let me know.    What is the placement destination/facility re: the FL2?

## 2023-01-12 NOTE — Telephone Encounter (Signed)
 Pharmacy Patient Advocate Encounter   Received notification from CoverMyMeds that prior authorization for HYDROcodone -Acetaminophen  5-325MG  tablets  is required/requested.   Insurance verification completed.   The patient is insured through [employer] .   Per test claim: Refill too soon. PA is not needed at this time. Medication was filled 01/12/2023. Next eligible fill date is 02/03/2023.

## 2023-01-12 NOTE — Telephone Encounter (Signed)
 Patient son is needing the fl2 form filled out for his mom his sister that is on the account is in the hospital and can not take calls right now and he stated he can have his mom get on the phone and give verbal constant that he can speak on her behave her son ozell would like a call back today he stated that he has to get his children from school before the snow hits and Prolly want be able to stop by the office he would like a call back regarding this

## 2023-01-14 NOTE — Telephone Encounter (Signed)
 Sent. Thanks.

## 2023-01-14 NOTE — Telephone Encounter (Signed)
 When he has an idea about when he'll need the FL2, please start a new FL2 with her med list and demographic info entered.  Please print it at that point and I'll finish it.  Thanks.

## 2023-01-15 NOTE — Telephone Encounter (Signed)
See below . Thanks

## 2023-01-15 NOTE — Telephone Encounter (Signed)
 Megan Peterson wants  AV  to call him with home connect number you gave him the other day

## 2023-01-16 NOTE — Telephone Encounter (Signed)
Spoke with patient son

## 2023-01-17 ENCOUNTER — Other Ambulatory Visit (HOSPITAL_COMMUNITY): Payer: Self-pay

## 2023-01-17 ENCOUNTER — Other Ambulatory Visit: Payer: Self-pay

## 2023-01-17 NOTE — Telephone Encounter (Signed)
 Per encounter from CPhT, dated 01/12/2023  Pharmacy Patient Advocate Encounter   Received notification from CoverMyMeds that prior authorization for HYDROcodone -Acetaminophen  5-325MG  tablets  is required/requested.   Insurance verification completed.   The patient is insured through [employer] .   Per test claim: Refill too soon. PA is not needed at this time. Medication was filled 01/12/2023. Next eligible fill date is 02/03/2023.       Signing off on duplicate encounter as no PA is needed, thanks.

## 2023-01-18 ENCOUNTER — Encounter: Payer: Self-pay | Admitting: Family Medicine

## 2023-01-18 ENCOUNTER — Ambulatory Visit (INDEPENDENT_AMBULATORY_CARE_PROVIDER_SITE_OTHER): Payer: Medicare (Managed Care) | Admitting: Family Medicine

## 2023-01-18 ENCOUNTER — Telehealth: Payer: Self-pay | Admitting: Family Medicine

## 2023-01-18 ENCOUNTER — Other Ambulatory Visit: Payer: Self-pay

## 2023-01-18 VITALS — BP 110/68 | HR 95 | Temp 97.9°F | Ht <= 58 in | Wt 146.0 lb

## 2023-01-18 DIAGNOSIS — J9611 Chronic respiratory failure with hypoxia: Secondary | ICD-10-CM

## 2023-01-18 DIAGNOSIS — H00019 Hordeolum externum unspecified eye, unspecified eyelid: Secondary | ICD-10-CM

## 2023-01-18 DIAGNOSIS — M549 Dorsalgia, unspecified: Secondary | ICD-10-CM | POA: Diagnosis not present

## 2023-01-18 DIAGNOSIS — R413 Other amnesia: Secondary | ICD-10-CM

## 2023-01-18 DIAGNOSIS — G8929 Other chronic pain: Secondary | ICD-10-CM

## 2023-01-18 DIAGNOSIS — J432 Centrilobular emphysema: Secondary | ICD-10-CM

## 2023-01-18 MED ORDER — TRIAMCINOLONE ACETONIDE 0.5 % EX CREA: TOPICAL_CREAM | CUTANEOUS | 1 refills | Status: AC

## 2023-01-18 MED ORDER — TRELEGY ELLIPTA 100-62.5-25 MCG/ACT IN AEPB: 1.0000 | INHALATION_SPRAY | Freq: Every day | RESPIRATORY_TRACT | 5 refills | Status: DC

## 2023-01-18 MED ORDER — TRELEGY ELLIPTA 100-62.5-25 MCG/ACT IN AEPB: 1.0000 | INHALATION_SPRAY | Freq: Every day | RESPIRATORY_TRACT | 5 refills | Status: AC

## 2023-01-18 MED ORDER — ALBUTEROL SULFATE 0.63 MG/3ML IN NEBU: 1.0000 | INHALATION_SOLUTION | Freq: Four times a day (QID) | RESPIRATORY_TRACT | 3 refills | Status: DC | PRN

## 2023-01-18 MED ORDER — ALBUTEROL SULFATE 0.63 MG/3ML IN NEBU: 1.0000 | INHALATION_SOLUTION | Freq: Four times a day (QID) | RESPIRATORY_TRACT | 3 refills | Status: AC | PRN

## 2023-01-18 NOTE — Progress Notes (Signed)
Eyelid lesion noted by patient and son.  Have been using warm compresses in the meantime for presumed stye and it was improving.  Noted on the medial upper eyelid.  No trauma.  Chronic pain. On hydrocodone for back pain.  Longstanding back pain.  Not sedated.  Update about home situation.  Staying with her son recently.  Daughter reportedly has been in the hospital.   Needed refill on SABA neb.  Done at OV.  Discussed. Still on namenda but memory is worse.  Doesn't recall the year.  Still on 20mg  lasix, dw pt about inc dose of inc in BLE edema, up to 40mg  prn.  Son is out of work caring for his mother.  Discussed.  Leg irritation. She had been picking at irritated areas.  Patient prefers to live with her daughter but this is apparently not feasible now.  Discussed.  Meds, vitals, and allergies reviewed.   ROS: Per HPI unless specifically indicated in ROS section   Nad but does not recall the year.  Oriented to self. Ncat Small stye on the R upper medial eyelid with minimal irritation.   Neck supple, no LA Rrr Ctab L lower back ttp, midline not ttp.   R > L shin excoriation and scabbed, dry skin.  No spreading erythema.  35 minutes were devoted to patient care in this encounter (this includes time spent reviewing the patient's file/history, interviewing and examining the patient, counseling/reviewing plan with patient).

## 2023-01-18 NOTE — Telephone Encounter (Signed)
Copied from CRM [id number]. Topic: Clinical - Prescription Issue >> Jan 18, 2023  1:47 PM Deaijah H wrote: Reason for CRM: Patients son Casimiro Needle called in stating patient needs another prescription of Trelegy Ellipta stated it wasn't put on list / would like a call back once it has been taken care of [phone]

## 2023-01-18 NOTE — Addendum Note (Signed)
Addended by: Joaquim Nam on: 01/18/2023 10:25 PM   Modules accepted: Orders

## 2023-01-18 NOTE — Telephone Encounter (Signed)
Done.  Initially sent to [facility], then resent to [facility].  Thanks.

## 2023-01-18 NOTE — Patient Instructions (Addendum)
Let me know if you need FLMA papers done.   Warm compresses for the eye irritation.  Use triamcinolone cream on your legs twice a day.  Take care.  Glad to see you. We'll call about home health.

## 2023-01-19 ENCOUNTER — Ambulatory Visit: Payer: Medicare (Managed Care) | Admitting: Family Medicine

## 2023-01-19 NOTE — Telephone Encounter (Signed)
Casimiro Needle patients son has been notified

## 2023-01-21 DIAGNOSIS — H00019 Hordeolum externum unspecified eye, unspecified eyelid: Secondary | ICD-10-CM

## 2023-01-21 HISTORY — DX: Hordeolum externum unspecified eye, unspecified eyelid: H00.019

## 2023-01-21 NOTE — Assessment & Plan Note (Signed)
Continue hydrocodone at baseline and refer to home health PT to see about strengthening exercises.

## 2023-01-21 NOTE — Assessment & Plan Note (Signed)
History of.  Continue albuterol as needed.  Refill sent.

## 2023-01-21 NOTE — Assessment & Plan Note (Signed)
Continue warm compresses.  Improving per patient and son report.

## 2023-01-21 NOTE — Assessment & Plan Note (Signed)
Decline in memory noted, still taking Namenda.  Change in home environment, now living with her son.  This likely influences her picking at her legs.  See skin care plan.

## 2023-01-29 ENCOUNTER — Telehealth: Payer: Self-pay | Admitting: Family Medicine

## 2023-01-29 NOTE — Telephone Encounter (Signed)
Copied from CRM [id number]. Topic: General - Other >> Jan 29, 2023  9:48 AM Lorin Glass B wrote: Reason for CRM: Patients son Casimiro Needle called in regarding FL2 forms being sent over to fax #[phone], Attn: Shantelle". To include medication list and medical history.

## 2023-01-29 NOTE — Telephone Encounter (Signed)
Spoke with Casimiro Needle and advised that Dr. Para March is out of office until Thursday. Will see if the covering provider will sign but no guarantee.

## 2023-01-30 ENCOUNTER — Telehealth: Payer: Self-pay | Admitting: Family Medicine

## 2023-01-30 ENCOUNTER — Other Ambulatory Visit (HOSPITAL_COMMUNITY): Payer: Self-pay

## 2023-01-30 NOTE — Telephone Encounter (Signed)
Reason for CRM: patient son Mr. Casimiro Needle is calling back concerning fl2 forms . Patient son says daughter says there is one on file and just needs to be dated and sent supposedly there is one already signed just needs to be dated and sent . [facility] [phone]. Attention to shantelle.

## 2023-01-30 NOTE — Telephone Encounter (Signed)
Copied from CRM [id number]. Topic: General - Other >> Jan 30, 2023  8:21 AM Theodis Sato wrote: Reason for CRM: **Message for [name]** Patients son [name] is requesting a call back from you at [phone] regarding an update on patients FL2 from as well as to discuss MyChart information.

## 2023-01-30 NOTE — Telephone Encounter (Signed)
Return call to Casimiro Needle who is on [place]. I did advise that Dr. Para March is out of office and that once he returns he will work on getting the FL2 form filled out

## 2023-01-30 NOTE — Telephone Encounter (Signed)
Returned call to patient son and advised that she is going to a new facility therefore  new form is needed

## 2023-01-31 NOTE — Telephone Encounter (Signed)
Placed on your desk.

## 2023-01-31 NOTE — Telephone Encounter (Signed)
Please check the FL2 to make sure the med list is updated and I'll work on it when I return to clinic.  Thanks.

## 2023-02-01 ENCOUNTER — Encounter: Payer: Self-pay | Admitting: Family Medicine

## 2023-02-01 NOTE — Telephone Encounter (Signed)
Closing encounter patient son also sent mychart message and has been responded to via [employer]

## 2023-02-01 NOTE — Telephone Encounter (Signed)
Reason for CRM: Patient's son Casimiro Needle is calling in to verify if FL2 form has been faxed over to [facility]. Fax # [phone], Att: Shantelle.  Son requests call back when the form has been faxed over.

## 2023-02-06 DIAGNOSIS — Z556 Problems related to health literacy: Secondary | ICD-10-CM | POA: Diagnosis not present

## 2023-02-06 DIAGNOSIS — M542 Cervicalgia: Secondary | ICD-10-CM | POA: Diagnosis not present

## 2023-02-06 DIAGNOSIS — E785 Hyperlipidemia, unspecified: Secondary | ICD-10-CM | POA: Diagnosis not present

## 2023-02-06 DIAGNOSIS — M549 Dorsalgia, unspecified: Secondary | ICD-10-CM | POA: Diagnosis not present

## 2023-02-06 DIAGNOSIS — Z8673 Personal history of transient ischemic attack (TIA), and cerebral infarction without residual deficits: Secondary | ICD-10-CM | POA: Diagnosis not present

## 2023-02-06 DIAGNOSIS — I10 Essential (primary) hypertension: Secondary | ICD-10-CM | POA: Diagnosis not present

## 2023-02-06 DIAGNOSIS — M8580 Other specified disorders of bone density and structure, unspecified site: Secondary | ICD-10-CM | POA: Diagnosis not present

## 2023-02-06 DIAGNOSIS — G309 Alzheimer's disease, unspecified: Secondary | ICD-10-CM | POA: Diagnosis not present

## 2023-02-06 DIAGNOSIS — Z7951 Long term (current) use of inhaled steroids: Secondary | ICD-10-CM | POA: Diagnosis not present

## 2023-02-06 DIAGNOSIS — Z9981 Dependence on supplemental oxygen: Secondary | ICD-10-CM | POA: Diagnosis not present

## 2023-02-06 DIAGNOSIS — E538 Deficiency of other specified B group vitamins: Secondary | ICD-10-CM | POA: Diagnosis not present

## 2023-02-06 DIAGNOSIS — Z87440 Personal history of urinary (tract) infections: Secondary | ICD-10-CM | POA: Diagnosis not present

## 2023-02-06 DIAGNOSIS — J9611 Chronic respiratory failure with hypoxia: Secondary | ICD-10-CM | POA: Diagnosis not present

## 2023-02-06 DIAGNOSIS — J479 Bronchiectasis, uncomplicated: Secondary | ICD-10-CM | POA: Diagnosis not present

## 2023-02-06 DIAGNOSIS — J449 Chronic obstructive pulmonary disease, unspecified: Secondary | ICD-10-CM | POA: Diagnosis not present

## 2023-02-06 DIAGNOSIS — Z9181 History of falling: Secondary | ICD-10-CM | POA: Diagnosis not present

## 2023-02-06 DIAGNOSIS — G8929 Other chronic pain: Secondary | ICD-10-CM | POA: Diagnosis not present

## 2023-02-07 DIAGNOSIS — I10 Essential (primary) hypertension: Secondary | ICD-10-CM | POA: Diagnosis not present

## 2023-02-07 DIAGNOSIS — E785 Hyperlipidemia, unspecified: Secondary | ICD-10-CM | POA: Diagnosis not present

## 2023-02-07 DIAGNOSIS — M549 Dorsalgia, unspecified: Secondary | ICD-10-CM | POA: Diagnosis not present

## 2023-02-07 DIAGNOSIS — J9611 Chronic respiratory failure with hypoxia: Secondary | ICD-10-CM | POA: Diagnosis not present

## 2023-02-07 DIAGNOSIS — Z556 Problems related to health literacy: Secondary | ICD-10-CM | POA: Diagnosis not present

## 2023-02-07 DIAGNOSIS — G309 Alzheimer's disease, unspecified: Secondary | ICD-10-CM | POA: Diagnosis not present

## 2023-02-07 DIAGNOSIS — M8580 Other specified disorders of bone density and structure, unspecified site: Secondary | ICD-10-CM | POA: Diagnosis not present

## 2023-02-07 DIAGNOSIS — Z7951 Long term (current) use of inhaled steroids: Secondary | ICD-10-CM | POA: Diagnosis not present

## 2023-02-07 DIAGNOSIS — J449 Chronic obstructive pulmonary disease, unspecified: Secondary | ICD-10-CM | POA: Diagnosis not present

## 2023-02-07 DIAGNOSIS — J479 Bronchiectasis, uncomplicated: Secondary | ICD-10-CM | POA: Diagnosis not present

## 2023-02-07 DIAGNOSIS — Z87440 Personal history of urinary (tract) infections: Secondary | ICD-10-CM | POA: Diagnosis not present

## 2023-02-07 DIAGNOSIS — Z9981 Dependence on supplemental oxygen: Secondary | ICD-10-CM | POA: Diagnosis not present

## 2023-02-07 DIAGNOSIS — Z8673 Personal history of transient ischemic attack (TIA), and cerebral infarction without residual deficits: Secondary | ICD-10-CM | POA: Diagnosis not present

## 2023-02-07 DIAGNOSIS — M542 Cervicalgia: Secondary | ICD-10-CM | POA: Diagnosis not present

## 2023-02-07 DIAGNOSIS — Z9181 History of falling: Secondary | ICD-10-CM | POA: Diagnosis not present

## 2023-02-07 DIAGNOSIS — G8929 Other chronic pain: Secondary | ICD-10-CM | POA: Diagnosis not present

## 2023-02-07 DIAGNOSIS — E538 Deficiency of other specified B group vitamins: Secondary | ICD-10-CM | POA: Diagnosis not present

## 2023-02-09 DIAGNOSIS — J9611 Chronic respiratory failure with hypoxia: Secondary | ICD-10-CM | POA: Diagnosis not present

## 2023-02-13 DIAGNOSIS — M8580 Other specified disorders of bone density and structure, unspecified site: Secondary | ICD-10-CM | POA: Diagnosis not present

## 2023-02-13 DIAGNOSIS — M542 Cervicalgia: Secondary | ICD-10-CM | POA: Diagnosis not present

## 2023-02-13 DIAGNOSIS — Z9981 Dependence on supplemental oxygen: Secondary | ICD-10-CM | POA: Diagnosis not present

## 2023-02-13 DIAGNOSIS — E538 Deficiency of other specified B group vitamins: Secondary | ICD-10-CM | POA: Diagnosis not present

## 2023-02-13 DIAGNOSIS — I10 Essential (primary) hypertension: Secondary | ICD-10-CM | POA: Diagnosis not present

## 2023-02-13 DIAGNOSIS — Z87440 Personal history of urinary (tract) infections: Secondary | ICD-10-CM | POA: Diagnosis not present

## 2023-02-13 DIAGNOSIS — M549 Dorsalgia, unspecified: Secondary | ICD-10-CM | POA: Diagnosis not present

## 2023-02-13 DIAGNOSIS — G309 Alzheimer's disease, unspecified: Secondary | ICD-10-CM | POA: Diagnosis not present

## 2023-02-13 DIAGNOSIS — E785 Hyperlipidemia, unspecified: Secondary | ICD-10-CM | POA: Diagnosis not present

## 2023-02-13 DIAGNOSIS — Z556 Problems related to health literacy: Secondary | ICD-10-CM | POA: Diagnosis not present

## 2023-02-13 DIAGNOSIS — Z7951 Long term (current) use of inhaled steroids: Secondary | ICD-10-CM | POA: Diagnosis not present

## 2023-02-13 DIAGNOSIS — J479 Bronchiectasis, uncomplicated: Secondary | ICD-10-CM | POA: Diagnosis not present

## 2023-02-13 DIAGNOSIS — J9611 Chronic respiratory failure with hypoxia: Secondary | ICD-10-CM | POA: Diagnosis not present

## 2023-02-13 DIAGNOSIS — J449 Chronic obstructive pulmonary disease, unspecified: Secondary | ICD-10-CM | POA: Diagnosis not present

## 2023-02-13 DIAGNOSIS — G8929 Other chronic pain: Secondary | ICD-10-CM | POA: Diagnosis not present

## 2023-02-13 DIAGNOSIS — Z9181 History of falling: Secondary | ICD-10-CM | POA: Diagnosis not present

## 2023-02-13 DIAGNOSIS — Z8673 Personal history of transient ischemic attack (TIA), and cerebral infarction without residual deficits: Secondary | ICD-10-CM | POA: Diagnosis not present

## 2023-02-14 ENCOUNTER — Other Ambulatory Visit: Payer: Self-pay | Admitting: Neurology

## 2023-02-15 DIAGNOSIS — Z8673 Personal history of transient ischemic attack (TIA), and cerebral infarction without residual deficits: Secondary | ICD-10-CM | POA: Diagnosis not present

## 2023-02-15 DIAGNOSIS — E538 Deficiency of other specified B group vitamins: Secondary | ICD-10-CM | POA: Diagnosis not present

## 2023-02-15 DIAGNOSIS — M542 Cervicalgia: Secondary | ICD-10-CM | POA: Diagnosis not present

## 2023-02-15 DIAGNOSIS — J9611 Chronic respiratory failure with hypoxia: Secondary | ICD-10-CM | POA: Diagnosis not present

## 2023-02-15 DIAGNOSIS — Z9981 Dependence on supplemental oxygen: Secondary | ICD-10-CM | POA: Diagnosis not present

## 2023-02-15 DIAGNOSIS — Z556 Problems related to health literacy: Secondary | ICD-10-CM | POA: Diagnosis not present

## 2023-02-15 DIAGNOSIS — M8580 Other specified disorders of bone density and structure, unspecified site: Secondary | ICD-10-CM | POA: Diagnosis not present

## 2023-02-15 DIAGNOSIS — G309 Alzheimer's disease, unspecified: Secondary | ICD-10-CM | POA: Diagnosis not present

## 2023-02-15 DIAGNOSIS — J449 Chronic obstructive pulmonary disease, unspecified: Secondary | ICD-10-CM | POA: Diagnosis not present

## 2023-02-15 DIAGNOSIS — E785 Hyperlipidemia, unspecified: Secondary | ICD-10-CM | POA: Diagnosis not present

## 2023-02-15 DIAGNOSIS — Z7951 Long term (current) use of inhaled steroids: Secondary | ICD-10-CM | POA: Diagnosis not present

## 2023-02-15 DIAGNOSIS — M549 Dorsalgia, unspecified: Secondary | ICD-10-CM | POA: Diagnosis not present

## 2023-02-15 DIAGNOSIS — Z87440 Personal history of urinary (tract) infections: Secondary | ICD-10-CM | POA: Diagnosis not present

## 2023-02-15 DIAGNOSIS — G8929 Other chronic pain: Secondary | ICD-10-CM | POA: Diagnosis not present

## 2023-02-15 DIAGNOSIS — Z9181 History of falling: Secondary | ICD-10-CM | POA: Diagnosis not present

## 2023-02-15 DIAGNOSIS — I10 Essential (primary) hypertension: Secondary | ICD-10-CM | POA: Diagnosis not present

## 2023-02-15 DIAGNOSIS — J479 Bronchiectasis, uncomplicated: Secondary | ICD-10-CM | POA: Diagnosis not present

## 2023-02-16 DIAGNOSIS — Z8673 Personal history of transient ischemic attack (TIA), and cerebral infarction without residual deficits: Secondary | ICD-10-CM | POA: Diagnosis not present

## 2023-02-16 DIAGNOSIS — M549 Dorsalgia, unspecified: Secondary | ICD-10-CM | POA: Diagnosis not present

## 2023-02-16 DIAGNOSIS — Z9981 Dependence on supplemental oxygen: Secondary | ICD-10-CM | POA: Diagnosis not present

## 2023-02-16 DIAGNOSIS — Z556 Problems related to health literacy: Secondary | ICD-10-CM | POA: Diagnosis not present

## 2023-02-16 DIAGNOSIS — Z87440 Personal history of urinary (tract) infections: Secondary | ICD-10-CM | POA: Diagnosis not present

## 2023-02-16 DIAGNOSIS — Z7951 Long term (current) use of inhaled steroids: Secondary | ICD-10-CM | POA: Diagnosis not present

## 2023-02-16 DIAGNOSIS — M542 Cervicalgia: Secondary | ICD-10-CM | POA: Diagnosis not present

## 2023-02-16 DIAGNOSIS — G8929 Other chronic pain: Secondary | ICD-10-CM | POA: Diagnosis not present

## 2023-02-16 DIAGNOSIS — G309 Alzheimer's disease, unspecified: Secondary | ICD-10-CM | POA: Diagnosis not present

## 2023-02-16 DIAGNOSIS — E538 Deficiency of other specified B group vitamins: Secondary | ICD-10-CM | POA: Diagnosis not present

## 2023-02-16 DIAGNOSIS — J449 Chronic obstructive pulmonary disease, unspecified: Secondary | ICD-10-CM | POA: Diagnosis not present

## 2023-02-16 DIAGNOSIS — Z9181 History of falling: Secondary | ICD-10-CM | POA: Diagnosis not present

## 2023-02-16 DIAGNOSIS — M8580 Other specified disorders of bone density and structure, unspecified site: Secondary | ICD-10-CM | POA: Diagnosis not present

## 2023-02-16 DIAGNOSIS — E785 Hyperlipidemia, unspecified: Secondary | ICD-10-CM | POA: Diagnosis not present

## 2023-02-16 DIAGNOSIS — J479 Bronchiectasis, uncomplicated: Secondary | ICD-10-CM | POA: Diagnosis not present

## 2023-02-16 DIAGNOSIS — I10 Essential (primary) hypertension: Secondary | ICD-10-CM | POA: Diagnosis not present

## 2023-02-16 DIAGNOSIS — J9611 Chronic respiratory failure with hypoxia: Secondary | ICD-10-CM | POA: Diagnosis not present

## 2023-02-19 DIAGNOSIS — J479 Bronchiectasis, uncomplicated: Secondary | ICD-10-CM

## 2023-02-19 DIAGNOSIS — G309 Alzheimer's disease, unspecified: Secondary | ICD-10-CM

## 2023-02-19 DIAGNOSIS — E538 Deficiency of other specified B group vitamins: Secondary | ICD-10-CM

## 2023-02-19 DIAGNOSIS — M549 Dorsalgia, unspecified: Secondary | ICD-10-CM

## 2023-02-19 DIAGNOSIS — M8580 Other specified disorders of bone density and structure, unspecified site: Secondary | ICD-10-CM

## 2023-02-19 DIAGNOSIS — G8929 Other chronic pain: Secondary | ICD-10-CM

## 2023-02-19 DIAGNOSIS — F0284 Dementia in other diseases classified elsewhere, unspecified severity, with anxiety: Secondary | ICD-10-CM

## 2023-02-19 DIAGNOSIS — F411 Generalized anxiety disorder: Secondary | ICD-10-CM

## 2023-02-19 DIAGNOSIS — I10 Essential (primary) hypertension: Secondary | ICD-10-CM

## 2023-02-19 DIAGNOSIS — J9611 Chronic respiratory failure with hypoxia: Secondary | ICD-10-CM

## 2023-02-19 DIAGNOSIS — M542 Cervicalgia: Secondary | ICD-10-CM

## 2023-02-19 DIAGNOSIS — E785 Hyperlipidemia, unspecified: Secondary | ICD-10-CM

## 2023-02-20 ENCOUNTER — Other Ambulatory Visit: Payer: Self-pay | Admitting: Neurology

## 2023-02-20 ENCOUNTER — Other Ambulatory Visit: Payer: Self-pay

## 2023-02-20 ENCOUNTER — Other Ambulatory Visit: Payer: Self-pay | Admitting: Family Medicine

## 2023-02-20 MED ORDER — METOPROLOL SUCCINATE ER 25 MG PO TB24: 25.0000 mg | ORAL_TABLET | Freq: Every day | ORAL | 1 refills | Status: AC

## 2023-03-01 ENCOUNTER — Other Ambulatory Visit: Payer: Self-pay | Admitting: Neurology

## 2023-03-01 DIAGNOSIS — M549 Dorsalgia, unspecified: Secondary | ICD-10-CM | POA: Diagnosis not present

## 2023-03-01 DIAGNOSIS — E538 Deficiency of other specified B group vitamins: Secondary | ICD-10-CM | POA: Diagnosis not present

## 2023-03-01 DIAGNOSIS — G8929 Other chronic pain: Secondary | ICD-10-CM | POA: Diagnosis not present

## 2023-03-01 DIAGNOSIS — Z7951 Long term (current) use of inhaled steroids: Secondary | ICD-10-CM | POA: Diagnosis not present

## 2023-03-01 DIAGNOSIS — Z556 Problems related to health literacy: Secondary | ICD-10-CM | POA: Diagnosis not present

## 2023-03-01 DIAGNOSIS — M8580 Other specified disorders of bone density and structure, unspecified site: Secondary | ICD-10-CM | POA: Diagnosis not present

## 2023-03-01 DIAGNOSIS — E785 Hyperlipidemia, unspecified: Secondary | ICD-10-CM | POA: Diagnosis not present

## 2023-03-01 DIAGNOSIS — M542 Cervicalgia: Secondary | ICD-10-CM | POA: Diagnosis not present

## 2023-03-01 DIAGNOSIS — G309 Alzheimer's disease, unspecified: Secondary | ICD-10-CM | POA: Diagnosis not present

## 2023-03-01 DIAGNOSIS — Z9181 History of falling: Secondary | ICD-10-CM | POA: Diagnosis not present

## 2023-03-01 DIAGNOSIS — Z8673 Personal history of transient ischemic attack (TIA), and cerebral infarction without residual deficits: Secondary | ICD-10-CM | POA: Diagnosis not present

## 2023-03-01 DIAGNOSIS — J479 Bronchiectasis, uncomplicated: Secondary | ICD-10-CM | POA: Diagnosis not present

## 2023-03-01 DIAGNOSIS — J449 Chronic obstructive pulmonary disease, unspecified: Secondary | ICD-10-CM | POA: Diagnosis not present

## 2023-03-01 DIAGNOSIS — J9611 Chronic respiratory failure with hypoxia: Secondary | ICD-10-CM | POA: Diagnosis not present

## 2023-03-01 DIAGNOSIS — Z87440 Personal history of urinary (tract) infections: Secondary | ICD-10-CM | POA: Diagnosis not present

## 2023-03-01 DIAGNOSIS — Z9981 Dependence on supplemental oxygen: Secondary | ICD-10-CM | POA: Diagnosis not present

## 2023-03-01 DIAGNOSIS — I10 Essential (primary) hypertension: Secondary | ICD-10-CM | POA: Diagnosis not present

## 2023-03-02 ENCOUNTER — Encounter: Payer: Self-pay | Admitting: Physician Assistant

## 2023-03-05 DIAGNOSIS — M549 Dorsalgia, unspecified: Secondary | ICD-10-CM | POA: Diagnosis not present

## 2023-03-05 DIAGNOSIS — M8580 Other specified disorders of bone density and structure, unspecified site: Secondary | ICD-10-CM | POA: Diagnosis not present

## 2023-03-05 DIAGNOSIS — J9611 Chronic respiratory failure with hypoxia: Secondary | ICD-10-CM | POA: Diagnosis not present

## 2023-03-05 DIAGNOSIS — Z87440 Personal history of urinary (tract) infections: Secondary | ICD-10-CM | POA: Diagnosis not present

## 2023-03-05 DIAGNOSIS — Z556 Problems related to health literacy: Secondary | ICD-10-CM | POA: Diagnosis not present

## 2023-03-05 DIAGNOSIS — Z8673 Personal history of transient ischemic attack (TIA), and cerebral infarction without residual deficits: Secondary | ICD-10-CM | POA: Diagnosis not present

## 2023-03-05 DIAGNOSIS — Z9981 Dependence on supplemental oxygen: Secondary | ICD-10-CM | POA: Diagnosis not present

## 2023-03-05 DIAGNOSIS — I10 Essential (primary) hypertension: Secondary | ICD-10-CM | POA: Diagnosis not present

## 2023-03-05 DIAGNOSIS — E785 Hyperlipidemia, unspecified: Secondary | ICD-10-CM | POA: Diagnosis not present

## 2023-03-05 DIAGNOSIS — G309 Alzheimer's disease, unspecified: Secondary | ICD-10-CM | POA: Diagnosis not present

## 2023-03-05 DIAGNOSIS — E538 Deficiency of other specified B group vitamins: Secondary | ICD-10-CM | POA: Diagnosis not present

## 2023-03-05 DIAGNOSIS — J449 Chronic obstructive pulmonary disease, unspecified: Secondary | ICD-10-CM | POA: Diagnosis not present

## 2023-03-05 DIAGNOSIS — M542 Cervicalgia: Secondary | ICD-10-CM | POA: Diagnosis not present

## 2023-03-05 DIAGNOSIS — Z9181 History of falling: Secondary | ICD-10-CM | POA: Diagnosis not present

## 2023-03-05 DIAGNOSIS — Z7951 Long term (current) use of inhaled steroids: Secondary | ICD-10-CM | POA: Diagnosis not present

## 2023-03-05 DIAGNOSIS — G8929 Other chronic pain: Secondary | ICD-10-CM | POA: Diagnosis not present

## 2023-03-05 DIAGNOSIS — J479 Bronchiectasis, uncomplicated: Secondary | ICD-10-CM | POA: Diagnosis not present

## 2023-03-08 DIAGNOSIS — Z9181 History of falling: Secondary | ICD-10-CM | POA: Diagnosis not present

## 2023-03-08 DIAGNOSIS — M549 Dorsalgia, unspecified: Secondary | ICD-10-CM | POA: Diagnosis not present

## 2023-03-08 DIAGNOSIS — Z8673 Personal history of transient ischemic attack (TIA), and cerebral infarction without residual deficits: Secondary | ICD-10-CM | POA: Diagnosis not present

## 2023-03-08 DIAGNOSIS — E785 Hyperlipidemia, unspecified: Secondary | ICD-10-CM | POA: Diagnosis not present

## 2023-03-08 DIAGNOSIS — J9611 Chronic respiratory failure with hypoxia: Secondary | ICD-10-CM | POA: Diagnosis not present

## 2023-03-08 DIAGNOSIS — Z556 Problems related to health literacy: Secondary | ICD-10-CM | POA: Diagnosis not present

## 2023-03-08 DIAGNOSIS — M542 Cervicalgia: Secondary | ICD-10-CM | POA: Diagnosis not present

## 2023-03-08 DIAGNOSIS — M8580 Other specified disorders of bone density and structure, unspecified site: Secondary | ICD-10-CM | POA: Diagnosis not present

## 2023-03-08 DIAGNOSIS — G309 Alzheimer's disease, unspecified: Secondary | ICD-10-CM | POA: Diagnosis not present

## 2023-03-08 DIAGNOSIS — I10 Essential (primary) hypertension: Secondary | ICD-10-CM | POA: Diagnosis not present

## 2023-03-08 DIAGNOSIS — G8929 Other chronic pain: Secondary | ICD-10-CM | POA: Diagnosis not present

## 2023-03-08 DIAGNOSIS — E538 Deficiency of other specified B group vitamins: Secondary | ICD-10-CM | POA: Diagnosis not present

## 2023-03-08 DIAGNOSIS — J449 Chronic obstructive pulmonary disease, unspecified: Secondary | ICD-10-CM | POA: Diagnosis not present

## 2023-03-08 DIAGNOSIS — Z9981 Dependence on supplemental oxygen: Secondary | ICD-10-CM | POA: Diagnosis not present

## 2023-03-08 DIAGNOSIS — Z7951 Long term (current) use of inhaled steroids: Secondary | ICD-10-CM | POA: Diagnosis not present

## 2023-03-08 DIAGNOSIS — J479 Bronchiectasis, uncomplicated: Secondary | ICD-10-CM | POA: Diagnosis not present

## 2023-03-08 DIAGNOSIS — Z87440 Personal history of urinary (tract) infections: Secondary | ICD-10-CM | POA: Diagnosis not present

## 2023-03-09 DIAGNOSIS — J9611 Chronic respiratory failure with hypoxia: Secondary | ICD-10-CM | POA: Diagnosis not present

## 2023-03-15 DIAGNOSIS — E785 Hyperlipidemia, unspecified: Secondary | ICD-10-CM | POA: Diagnosis not present

## 2023-03-15 DIAGNOSIS — Z8673 Personal history of transient ischemic attack (TIA), and cerebral infarction without residual deficits: Secondary | ICD-10-CM | POA: Diagnosis not present

## 2023-03-15 DIAGNOSIS — J9611 Chronic respiratory failure with hypoxia: Secondary | ICD-10-CM | POA: Diagnosis not present

## 2023-03-15 DIAGNOSIS — M8580 Other specified disorders of bone density and structure, unspecified site: Secondary | ICD-10-CM | POA: Diagnosis not present

## 2023-03-15 DIAGNOSIS — Z87440 Personal history of urinary (tract) infections: Secondary | ICD-10-CM | POA: Diagnosis not present

## 2023-03-15 DIAGNOSIS — M549 Dorsalgia, unspecified: Secondary | ICD-10-CM | POA: Diagnosis not present

## 2023-03-15 DIAGNOSIS — J449 Chronic obstructive pulmonary disease, unspecified: Secondary | ICD-10-CM | POA: Diagnosis not present

## 2023-03-15 DIAGNOSIS — Z7951 Long term (current) use of inhaled steroids: Secondary | ICD-10-CM | POA: Diagnosis not present

## 2023-03-15 DIAGNOSIS — I10 Essential (primary) hypertension: Secondary | ICD-10-CM | POA: Diagnosis not present

## 2023-03-15 DIAGNOSIS — G8929 Other chronic pain: Secondary | ICD-10-CM | POA: Diagnosis not present

## 2023-03-15 DIAGNOSIS — E538 Deficiency of other specified B group vitamins: Secondary | ICD-10-CM | POA: Diagnosis not present

## 2023-03-15 DIAGNOSIS — Z9181 History of falling: Secondary | ICD-10-CM | POA: Diagnosis not present

## 2023-03-15 DIAGNOSIS — G309 Alzheimer's disease, unspecified: Secondary | ICD-10-CM | POA: Diagnosis not present

## 2023-03-15 DIAGNOSIS — J479 Bronchiectasis, uncomplicated: Secondary | ICD-10-CM | POA: Diagnosis not present

## 2023-03-15 DIAGNOSIS — Z556 Problems related to health literacy: Secondary | ICD-10-CM | POA: Diagnosis not present

## 2023-03-15 DIAGNOSIS — M542 Cervicalgia: Secondary | ICD-10-CM | POA: Diagnosis not present

## 2023-03-15 DIAGNOSIS — Z9981 Dependence on supplemental oxygen: Secondary | ICD-10-CM | POA: Diagnosis not present

## 2023-03-20 ENCOUNTER — Other Ambulatory Visit: Payer: Self-pay | Admitting: Family Medicine

## 2023-03-20 DIAGNOSIS — Z87440 Personal history of urinary (tract) infections: Secondary | ICD-10-CM | POA: Diagnosis not present

## 2023-03-20 DIAGNOSIS — G309 Alzheimer's disease, unspecified: Secondary | ICD-10-CM | POA: Diagnosis not present

## 2023-03-20 DIAGNOSIS — J9611 Chronic respiratory failure with hypoxia: Secondary | ICD-10-CM | POA: Diagnosis not present

## 2023-03-20 DIAGNOSIS — E785 Hyperlipidemia, unspecified: Secondary | ICD-10-CM | POA: Diagnosis not present

## 2023-03-20 DIAGNOSIS — M8580 Other specified disorders of bone density and structure, unspecified site: Secondary | ICD-10-CM | POA: Diagnosis not present

## 2023-03-20 DIAGNOSIS — M549 Dorsalgia, unspecified: Secondary | ICD-10-CM | POA: Diagnosis not present

## 2023-03-20 DIAGNOSIS — G8929 Other chronic pain: Secondary | ICD-10-CM | POA: Diagnosis not present

## 2023-03-20 DIAGNOSIS — E538 Deficiency of other specified B group vitamins: Secondary | ICD-10-CM | POA: Diagnosis not present

## 2023-03-20 DIAGNOSIS — J449 Chronic obstructive pulmonary disease, unspecified: Secondary | ICD-10-CM | POA: Diagnosis not present

## 2023-03-20 DIAGNOSIS — Z7951 Long term (current) use of inhaled steroids: Secondary | ICD-10-CM | POA: Diagnosis not present

## 2023-03-20 DIAGNOSIS — Z8673 Personal history of transient ischemic attack (TIA), and cerebral infarction without residual deficits: Secondary | ICD-10-CM | POA: Diagnosis not present

## 2023-03-20 DIAGNOSIS — I10 Essential (primary) hypertension: Secondary | ICD-10-CM | POA: Diagnosis not present

## 2023-03-20 DIAGNOSIS — Z9181 History of falling: Secondary | ICD-10-CM | POA: Diagnosis not present

## 2023-03-20 DIAGNOSIS — Z556 Problems related to health literacy: Secondary | ICD-10-CM | POA: Diagnosis not present

## 2023-03-20 DIAGNOSIS — J479 Bronchiectasis, uncomplicated: Secondary | ICD-10-CM | POA: Diagnosis not present

## 2023-03-20 DIAGNOSIS — Z9981 Dependence on supplemental oxygen: Secondary | ICD-10-CM | POA: Diagnosis not present

## 2023-03-20 DIAGNOSIS — M542 Cervicalgia: Secondary | ICD-10-CM | POA: Diagnosis not present

## 2023-03-29 DIAGNOSIS — J9611 Chronic respiratory failure with hypoxia: Secondary | ICD-10-CM | POA: Diagnosis not present

## 2023-03-30 ENCOUNTER — Other Ambulatory Visit: Payer: Self-pay | Admitting: Family Medicine

## 2023-04-09 DIAGNOSIS — J9611 Chronic respiratory failure with hypoxia: Secondary | ICD-10-CM | POA: Diagnosis not present

## 2023-04-13 ENCOUNTER — Encounter: Payer: Self-pay | Admitting: Family Medicine

## 2023-04-13 ENCOUNTER — Ambulatory Visit (INDEPENDENT_AMBULATORY_CARE_PROVIDER_SITE_OTHER): Admitting: Family Medicine

## 2023-04-13 ENCOUNTER — Telehealth: Payer: Self-pay

## 2023-04-13 VITALS — BP 114/68 | HR 73 | Temp 98.4°F | Ht <= 58 in | Wt 140.6 lb

## 2023-04-13 DIAGNOSIS — M5440 Lumbago with sciatica, unspecified side: Secondary | ICD-10-CM

## 2023-04-13 DIAGNOSIS — R413 Other amnesia: Secondary | ICD-10-CM

## 2023-04-13 DIAGNOSIS — J432 Centrilobular emphysema: Secondary | ICD-10-CM

## 2023-04-13 DIAGNOSIS — E538 Deficiency of other specified B group vitamins: Secondary | ICD-10-CM | POA: Diagnosis not present

## 2023-04-13 DIAGNOSIS — G8929 Other chronic pain: Secondary | ICD-10-CM | POA: Diagnosis not present

## 2023-04-13 MED ORDER — FUROSEMIDE 20 MG PO TABS: ORAL_TABLET | ORAL | 2 refills | Status: AC

## 2023-04-13 NOTE — Patient Instructions (Addendum)
 Don't change your meds for now.  Take care.  Glad to see you. I'll work on the [employer].  Go to the lab on the way out.   If you have mychart we'll likely use that to update you.

## 2023-04-13 NOTE — Telephone Encounter (Signed)
 Patient and her son Casimiro Needle was in office today. He advised that an FL2 form is needed for [facility] and [facility]. I have given the form for [facility] to Dr. Serita Kyle to complete. But I need additional for [facility]. I reached out to Crystal at [phone] and left a vm

## 2023-04-13 NOTE — Progress Notes (Signed)
 Still on trelegy with rare SABA use.  Using extra O2 prn but not 24/7.  On room air at the office visit.  Speaking in complete sentences and not in respiratory distress.  Taking lasix prn for BLE edema.    Still on hydrocodone and gabapentin at baseline. Pain controlled at OV but had pain walking into clinic.  Not sedated.  No falls.    H/o B12 def.  On replacement.  Recheck labs pending.  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  Already taking Namenda.  Compliant. What year is it? ---- "January February".  Then questioned again, "2020" Son noted more troubles with following directions, instrumental commands.   She is less independent with IADLs at home.  Placement is pending.  I am going to work on her FL 2.  She is living with son, with extra home care currently.    Meds, vitals, and allergies reviewed.   ROS: Per HPI unless specifically indicated in ROS section   Nad Ncat Neck supple no LA Rrr Ctab Abdomen soft.  Nontender.  Normal bowel sounds. Minimal LLE edema at OV.  No RLE edema.  Off O2 at the visit.

## 2023-04-14 LAB — BASIC METABOLIC PANEL WITH GFR
BUN/Creatinine Ratio: 14 (calc) (ref 6–22)
BUN: 25 mg/dL (ref 7–25)
CO2: 31 mmol/L (ref 20–32)
Calcium: 9.6 mg/dL (ref 8.6–10.4)
Chloride: 94 mmol/L — ABNORMAL LOW (ref 98–110)
Creat: 1.78 mg/dL — ABNORMAL HIGH (ref 0.60–0.95)
Glucose, Bld: 84 mg/dL (ref 65–99)
Potassium: 4 mmol/L (ref 3.5–5.3)
Sodium: 141 mmol/L (ref 135–146)
eGFR: 28 mL/min/{1.73_m2} — ABNORMAL LOW (ref >=60)

## 2023-04-14 LAB — CBC WITH DIFFERENTIAL/PLATELET
Absolute Lymphocytes: 3804 {cells}/uL (ref 850–3900)
Absolute Monocytes: 1014 {cells}/uL — ABNORMAL HIGH (ref 200–950)
Basophils Absolute: 109 {cells}/uL (ref 0–200)
Basophils Relative: 1 %
Eosinophils Absolute: 185 {cells}/uL (ref 15–500)
Eosinophils Relative: 1.7 %
HCT: 40.3 % (ref 35.0–45.0)
Hemoglobin: 13.7 g/dL (ref 11.7–15.5)
MCH: 31.8 pg (ref 27.0–33.0)
MCHC: 34 g/dL (ref 32.0–36.0)
MCV: 93.5 fL (ref 80.0–100.0)
MPV: 11.7 fL (ref 7.5–12.5)
Monocytes Relative: 9.3 %
Neutro Abs: 5788 {cells}/uL (ref 1500–7800)
Neutrophils Relative %: 53.1 %
Platelets: 263 10*3/uL (ref 140–400)
RBC: 4.31 10*6/uL (ref 3.80–5.10)
RDW: 12.7 % (ref 11.0–15.0)
Total Lymphocyte: 34.9 %
WBC: 10.9 10*3/uL — ABNORMAL HIGH (ref 3.8–10.8)

## 2023-04-14 LAB — VITAMIN B12: Vitamin B-12: 1861 pg/mL — ABNORMAL HIGH (ref 200–1100)

## 2023-04-15 ENCOUNTER — Other Ambulatory Visit: Payer: Self-pay | Admitting: Family Medicine

## 2023-04-15 DIAGNOSIS — R7989 Other specified abnormal findings of blood chemistry: Secondary | ICD-10-CM

## 2023-04-15 NOTE — Assessment & Plan Note (Signed)
 Continue O2 as needed.  Continue Trelegy.  Rare albuterol use.  Update me as needed.

## 2023-04-15 NOTE — Assessment & Plan Note (Signed)
 Continue gabapentin and hydrocodone.  Not sedated.  Okay for outpatient follow-up.

## 2023-04-15 NOTE — Assessment & Plan Note (Signed)
Recheck pending.  See notes on labs. 

## 2023-04-15 NOTE — Assessment & Plan Note (Signed)
 Unfortunately with memory loss that is progressive, still pleasant in conversation but not oriented to the year.  Less independent with ADLs at home and placement is pending.  Unguinal work on her FL 2 form.  See notes on labs.  Continue Namenda.  Status discussed with son at office visit.

## 2023-04-25 ENCOUNTER — Telehealth: Payer: Self-pay

## 2023-04-25 NOTE — Telephone Encounter (Signed)
 Left message for Crystal to return call in reference to the [facility] form for [facility]

## 2023-04-25 NOTE — Telephone Encounter (Signed)
 Left message with Shantelle at [facility] to advise that I did fax over last office notes and med list for patient as requested. If anything additionally is needed to please advise.

## 2023-04-27 ENCOUNTER — Other Ambulatory Visit (INDEPENDENT_AMBULATORY_CARE_PROVIDER_SITE_OTHER)

## 2023-04-27 DIAGNOSIS — R7989 Other specified abnormal findings of blood chemistry: Secondary | ICD-10-CM | POA: Diagnosis not present

## 2023-04-28 LAB — BASIC METABOLIC PANEL WITH GFR
BUN/Creatinine Ratio: 14 (calc) (ref 6–22)
BUN: 17 mg/dL (ref 7–25)
CO2: 31 mmol/L (ref 20–32)
Calcium: 8.6 mg/dL (ref 8.6–10.4)
Chloride: 99 mmol/L (ref 98–110)
Creat: 1.24 mg/dL — ABNORMAL HIGH (ref 0.60–0.95)
Glucose, Bld: 90 mg/dL (ref 65–99)
Potassium: 3.9 mmol/L (ref 3.5–5.3)
Sodium: 143 mmol/L (ref 135–146)
eGFR: 44 mL/min/{1.73_m2} — ABNORMAL LOW (ref >=60)

## 2023-04-29 DIAGNOSIS — J9611 Chronic respiratory failure with hypoxia: Secondary | ICD-10-CM | POA: Diagnosis not present

## 2023-05-04 ENCOUNTER — Ambulatory Visit: Payer: Medicaid Other | Admitting: Physician Assistant

## 2023-05-04 ENCOUNTER — Encounter: Payer: Self-pay | Admitting: Physician Assistant

## 2023-05-04 VITALS — BP 115/77 | HR 79 | Resp 20 | Ht <= 58 in

## 2023-05-04 DIAGNOSIS — F0280 Dementia in other diseases classified elsewhere without behavioral disturbance: Secondary | ICD-10-CM

## 2023-05-04 DIAGNOSIS — G309 Alzheimer's disease, unspecified: Secondary | ICD-10-CM | POA: Diagnosis not present

## 2023-05-04 MED ORDER — MEMANTINE HCL 10 MG PO TABS: 10.0000 mg | ORAL_TABLET | Freq: Two times a day (BID) | ORAL | 3 refills | Status: AC

## 2023-05-04 NOTE — Patient Instructions (Signed)
 memantine  10mg  tablet: twice a day  Routine exercise Mental exercises (puzzles, brain teasers) Socialization Recommend adult day program  Follow up in 6 months    Mediterranean Diet A Mediterranean diet refers to food and lifestyle choices that are based on the traditions of countries located on the [employer]. It focuses on eating more fruits, vegetables, whole grains, beans, nuts, seeds, and heart-healthy fats, and eating less dairy, meat, eggs, and processed foods with added sugar, salt, and fat. This way of eating has been shown to help prevent certain conditions and improve outcomes for people who have chronic diseases, like kidney disease and heart disease. What are tips for following this plan? Reading food labels Check the serving size of packaged foods. For foods such as rice and pasta, the serving size refers to the amount of cooked product, not dry. Check the total fat in packaged foods. Avoid foods that have saturated fat or trans fats. Check the ingredient list for added sugars, such as corn syrup. Shopping  Buy a variety of foods that offer a balanced diet, including: Fresh fruits and vegetables (produce). Grains, beans, nuts, and seeds. Some of these may be available in unpackaged forms or large amounts (in bulk). Fresh seafood. Poultry and eggs. Low-fat dairy products. Buy whole ingredients instead of prepackaged foods. Buy fresh fruits and vegetables in-season from local farmers markets. Buy plain frozen fruits and vegetables. If you do not have access to quality fresh seafood, buy precooked frozen shrimp or canned fish, such as tuna, salmon, or sardines. Stock your pantry so you always have certain foods on hand, such as olive oil, canned tuna, canned tomatoes, rice, pasta, and beans. Cooking Cook foods with extra-virgin olive oil instead of using butter or other vegetable oils. Have meat as a side dish, and have vegetables or grains as your main dish. This  means having meat in small portions or adding small amounts of meat to foods like pasta or stew. Use beans or vegetables instead of meat in common dishes like chili or lasagna. Experiment with different cooking methods. Try roasting, broiling, steaming, and sauting vegetables. Add frozen vegetables to soups, stews, pasta, or rice. Add nuts or seeds for added healthy fats and plant protein at each meal. You can add these to yogurt, salads, or vegetable dishes. Marinate fish or vegetables using olive oil, lemon juice, garlic, and fresh herbs. Meal planning Plan to eat one vegetarian meal one day each week. Try to work up to two vegetarian meals, if possible. Eat seafood two or more times a week. Have healthy snacks readily available, such as: Vegetable sticks with hummus. Greek yogurt. Fruit and nut trail mix. Eat balanced meals throughout the week. This includes: Fruit: 2-3 servings a day. Vegetables: 4-5 servings a day. Low-fat dairy: 2 servings a day. Fish, poultry, or lean meat: 1 serving a day. Beans and legumes: 2 or more servings a week. Nuts and seeds: 1-2 servings a day. Whole grains: 6-8 servings a day. Extra-virgin olive oil: 3-4 servings a day. Limit red meat and sweets to only a few servings a month. Lifestyle  Cook and eat meals together with your family, when possible. Drink enough fluid to keep your urine pale yellow. Be physically active every day. This includes: Aerobic exercise like running or swimming. Leisure activities like gardening, walking, or housework. Get 7-8 hours of sleep each night. If recommended by your health care provider, drink red wine in moderation. This means 1 glass a day for nonpregnant women and  2 glasses a day for men. A glass of wine equals 5 oz (150 mL). What foods should I eat? Fruits Apples. Apricots. Avocado. Berries. Bananas. Cherries. Dates. Figs. Grapes. Lemons. Melon. Oranges. Peaches. Plums. Pomegranate. Vegetables Artichokes.  Beets. Broccoli. Cabbage. Carrots. Eggplant. Green beans. Chard. Kale. Spinach. Onions. Leeks. Peas. Squash. Tomatoes. Peppers. Radishes. Grains Whole-grain pasta. Brown rice. Bulgur wheat. Polenta. Couscous. Whole-wheat bread. Dwyane Glad. Meats and other proteins Beans. Almonds. Sunflower seeds. Pine nuts. Peanuts. Cod. Salmon. Scallops. Shrimp. Tuna. Tilapia. Clams. Oysters. Eggs. Poultry without skin. Dairy Low-fat milk. Cheese. Greek yogurt. Fats and oils Extra-virgin olive oil. Avocado oil. Grapeseed oil. Beverages Water. Red wine. Herbal tea. Sweets and desserts Greek yogurt with honey. Baked apples. Poached pears. Trail mix. Seasonings and condiments Basil. Cilantro. Coriander. Cumin. Mint. Parsley. Sage. Rosemary. Tarragon. Garlic. Oregano. Thyme. Pepper. Balsamic vinegar. Tahini. Hummus. Tomato sauce. Olives. Mushrooms. The items listed above may not be a complete list of foods and beverages you can eat. Contact a dietitian for more information. What foods should I limit? This is a list of foods that should be eaten rarely or only on special occasions. Fruits Fruit canned in syrup. Vegetables Deep-fried potatoes (french fries). Grains Prepackaged pasta or rice dishes. Prepackaged cereal with added sugar. Prepackaged snacks with added sugar. Meats and other proteins Beef. Pork. Lamb. Poultry with skin. Hot dogs. Helene Loader. Dairy Ice cream. Sour cream. Whole milk. Fats and oils Butter. Canola oil. Vegetable oil. Beef fat (tallow). Lard. Beverages Juice. Sugar-sweetened soft drinks. Beer. Liquor and spirits. Sweets and desserts Cookies. Cakes. Pies. Candy. Seasonings and condiments Mayonnaise. Pre-made sauces and marinades. The items listed above may not be a complete list of foods and beverages you should limit. Contact a dietitian for more information. Summary The Mediterranean diet includes both food and lifestyle choices. Eat a variety of fresh fruits and vegetables,  beans, nuts, seeds, and whole grains. Limit the amount of red meat and sweets that you eat. If recommended by your health care provider, drink red wine in moderation. This means 1 glass a day for nonpregnant women and 2 glasses a day for men. A glass of wine equals 5 oz (150 mL). This information is not intended to replace advice given to you by your health care provider. Make sure you discuss any questions you have with your health care provider. Document Revised: 01/24/2019 Document Reviewed: 11/21/2018 Elsevier Patient Education  2022 [employer].

## 2023-05-04 NOTE — Progress Notes (Signed)
 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 Patient is currently on memantine  10 mg twice a day. Memory is stable. Needs assistance with ADLs, has HHN for safety. Mood is stable.     Follow up in  6 months. Continue memantine  10 mg bid, side effects discussed Recommend good control of her cardiovascular risk factors Continue to control mood as per PCP    Subjective:    This patient is accompanied in the office by her son who supplements the history.  Previous records as well as any outside records available were reviewed prior to todays visit. Patient was last seen on 04/17/2022   Any changes in memory since last visit? "  Worse ".  She does not remember new information, cannot operate the phone or the remote.  She enjoys watching TV and westerns (Gunsmoke). repeats oneself?  Endorsed Disoriented when walking into a room?  "Sometimes, but very seldom".   Leaving objects?  She may put things away and then blames others. She collects Kleenex.  Wandering behavior?  Denies.   Any personality changes since last visit?  She may have some crying spells. Any worsening depression?:  Denies.   Hallucinations or paranoia?  "Sometimes she hears knocking on the door when no one is there "  Someone has been writing on the wall and it is the wall pictures that have been there all the time " Any sleep changes?  Sleeps in a recliner.  Does not wear her oxygen  at night. Denies vivid dreams, REM behavior or sleepwalking   Sleep apnea?   Denies.   Any hygiene concerns?  She showers 2 times a week with HHN  Independent of bathing and dressing? " It is hard but she does it " Does the patient needs help with medications? Son is in charge   Who is in  charge of the finances?  Son is in charge. Bills are on autopay     Any changes in appetite?   She eats well. She forgets that she ate and then she eats again      Patient have trouble swallowing? Denies.   Does the patient cook? No Any headaches?   Denies.   Chronic back pain  denies.   Ambulates with difficulty?  Uses a walker for stability, does not walk far. When she is out she is in wheelchair.  Recent falls or head injuries? Denies  Unilateral weakness, numbness or tingling? Denies.   Any tremors?  Denies   Any anosmia?  Denies   Any incontinence of urine?  Endorsed, wears diapers Any bowel dysfunction? Denies  Patient lives with her son since January  5 times a week.  Does the patient drive? No longer drives    HISTORY: She started noticing memory problems in late 2019-early 2020.  At first, she forgot about food that she cooked.  She started forgetting to take her medications.  Her daughter now has to call and remind her three times a day.  She started frequently misplacing objects. .  TSH from October 2020 was 1.90.  Labs from February 2021 include B12 588, folate 17.7, MMA 200, homocysteine 15, and RPR non-reactive.  She had an MRI of the brain without contrast on 03/11/2019 which  was personally reviewed and showed mild chronic small vessel ischemic changes but otherwise unremarkable. She lives alone.  She goes grocery shopping.  She drives to the grocery store about 2 miles away.  She drives to church 7 miles away.  She drives to her daughter 12 miles away.  Sometimes she will drive to see her son and grandchildren who are 30 miles away.  Her daughter has rode in the passenger seat with her and has not been concerned about her driving.  She manages her own finances.  However, her daughter looked over her bills and noted that she had paid some bills twice and forgot to pay other bills.  She keeps her home clean.  She reports that she does not leave on the stove.  She socializes with her  neighbors everyday.  She sleeps well.  Her appetite is good.  She denies depression.   Family history: Her sister may have dementia but not officially diagnosed.  Maternal uncle may have had dementia.  Her mother passed away at age 41.  Her father passed away at younger age.    PREVIOUS MEDICATIONS: Donepezil  ("too drowsy during the day")  PREVIOUS MEDICATIONS:   CURRENT MEDICATIONS:  Outpatient Encounter Medications as of 05/04/2023  Medication Sig   albuterol  (ACCUNEB ) 0.63 MG/3ML nebulizer solution Take 3 mLs (0.63 mg total) by nebulization every 6 (six) hours as needed for wheezing.   albuterol  (VENTOLIN  HFA) 108 (90 Base) MCG/ACT inhaler INHALE 1 TO 2 PUFFS BY MOUTH EVERY 6 HOURS AS NEEDED FOR WHEEZING FOR SHORTNESS OF BREATH   cyanocobalamin  (VITAMIN B12) 1000 MCG tablet Take 1 tablet (1,000 mcg total) by mouth daily.   Fluticasone -Umeclidin-Vilant (TRELEGY ELLIPTA ) 100-62.5-25 MCG/ACT AEPB Inhale 1 puff into the lungs daily.   furosemide  (LASIX ) 20 MG tablet TAKE 1 TABLET BY MOUTH ONCE DAILY AS NEEDED IN THE MORNING FOR FLUID   gabapentin  (NEURONTIN ) 400 MG capsule Take 1 capsule (400 mg total) by mouth 3 (three) times daily.   HYDROcodone -acetaminophen  (NORCO/VICODIN) 5-325 MG tablet Take 1 tablet by mouth every 6 (six) hours as needed for moderate pain (pain score 4-6).   hydrOXYzine  (ATARAX ) 10 MG tablet Take 1 tablet (10 mg total) by mouth 3 (three) times daily as needed for anxiety (sedation caution).   memantine  (NAMENDA ) 10 MG tablet Take 1 tablet by mouth twice daily   metoprolol  succinate (TOPROL -XL) 25 MG 24 hr tablet Take 1 tablet (25 mg total) by mouth daily.   nitroGLYCERIN  (NITROSTAT ) 0.4 MG SL tablet Place 1 tablet (0.4 mg total) under the tongue every 5 (five) minutes as needed for chest pain (max 3 doses in 15 minutes.  if still having chest pain then go to the ER).   OXYGEN  Inhale 2 L/min into the lungs as needed (for shortness of breath).   polyethylene glycol (MIRALAX   / GLYCOLAX ) 17 g packet Take 17 g by mouth daily as needed for mild constipation.   Spacer/Aero-Holding Chambers (OPTICHAMBER ADVANTAGE-LG MASK) MISC 1 each by Does not apply route as needed.   triamcinolone  cream (KENALOG ) 0.5 % Put on rash on the legs twice a day as needed.   No facility-administered encounter medications on file as of 05/04/2023.       02/04/2019   10:09 AM 11/18/2018    2:00 PM 04/13/2017   12:17 PM  MMSE - Mini Mental State Exam  Orientation to time 5 5 5   Orientation to Place 5 5 5   Registration 3 3 3   Attention/ Calculation 2  5 0  Recall 2 3 3   Language- name 2 objects 2  0  Language- repeat 1 1 1   Language- follow 3 step command 3  3  Language- read & follow direction 1  0  Write a sentence 1  0  Copy design 0  0  Total score 25  20       No data to display          Objective:     PHYSICAL EXAMINATION:    VITALS:   Vitals:   05/04/23 1446  BP: 115/77  Pulse: 79  Resp: 20  SpO2: (!) 88%  Height: 4\' 10"  (1.473 m)    GEN:  The patient appears stated age and is in NAD. HEENT:  Normocephalic, atraumatic.   Neurological examination:  General: NAD, well-groomed, appears stated age. Orientation: The patient is alert. Oriented to person, place and not to date Cranial nerves: There is good facial symmetry.The speech is fluent and clear. No aphasia or dysarthria. Fund of knowledge is reduced. Recent and remote memory are impaired. Attention and concentration are reduced.  Able to name objects and repeat phrases.  Hearing is intact to conversational tone.   Sensation: Sensation is intact to light touch throughout Motor: Strength is at least antigravity x4. DTR's 2/4 in UE/LE     Movement examination: Tone: There is normal tone in the UE/LE Abnormal movements:  no tremor.  No myoclonus.  No asterixis.   Coordination:  There is no decremation with RAM's. Normal finger to nose  Gait and Station: Able to get from wheelchair with help, gait cautious  and narrow, stride is short.    Thank you for allowing us  the opportunity to participate in the care of this nice patient. Please do not hesitate to contact us  for any questions or concerns.   Total time spent on today's visit was 30 minutes dedicated to this patient today, preparing to see patient, examining the patient, ordering tests and/or medications and counseling the patient, documenting clinical information in the EHR or other health record, independently interpreting results and communicating results to the patient/family, discussing treatment and goals, answering patient's questions and coordinating care.  Cc:  Donnie Galea, MD  Tex Filbert 05/04/2023 3:19 PM

## 2023-05-09 ENCOUNTER — Telehealth: Payer: Self-pay | Admitting: Family Medicine

## 2023-05-09 DIAGNOSIS — J9611 Chronic respiratory failure with hypoxia: Secondary | ICD-10-CM | POA: Diagnosis not present

## 2023-05-09 NOTE — Telephone Encounter (Signed)
 Copied from CRM [id number]. Topic: General - Other >> May 09, 2023 11:06 AM Jenice Mitts wrote: Reason for CRM: Patients son michael calling to have fl2 forms sent to [facility]  Fax# - [phone] ATTN: Valinda Gault

## 2023-05-09 NOTE — Telephone Encounter (Unsigned)
 Copied from CRM [id number]. Topic: Clinical - Medication Question >> May 09, 2023 12:52 PM Shereese L wrote: Reason for CRM: patient son Bambi Lever called to have an [facility] sent to  [facility] [address], [place], [place] [address]  Fax# [phone] Attn: Corene Devonshire

## 2023-05-10 ENCOUNTER — Telehealth: Payer: Self-pay | Admitting: Family Medicine

## 2023-05-10 NOTE — Telephone Encounter (Signed)
 Spoke with Crystal and per [employer] they denied her application because she has to be in a LTC facility. Once she is immediate they will reach out to LTC to obtain needed documents.

## 2023-05-10 NOTE — Telephone Encounter (Signed)
 Copied from CRM [id number]. Topic: General - Other >> Apr 16, 2023  8:06 AM Oddis Bench wrote: Reason for CRM: Glynda Lash calling from DSS about Megan Peterson about a FL2 form for the patient she is wanting to provide fax number [phone]. She is stating that she spoke with Evalina.

## 2023-05-10 NOTE — Telephone Encounter (Signed)
 I will work on it when possible.  Thanks.

## 2023-05-10 NOTE — Telephone Encounter (Signed)
 Forms placed in your tray. Laurene Pont that you were out of office but as soon as they are signed we will fax.

## 2023-05-14 ENCOUNTER — Telehealth: Payer: Self-pay | Admitting: Family Medicine

## 2023-05-14 NOTE — Telephone Encounter (Signed)
 Form is done.  Please update him.  Thanks.

## 2023-05-14 NOTE — Telephone Encounter (Signed)
 Copied from CRM [id number]. Topic: General - Other >> May 14, 2023  9:42 AM Magdalene School wrote: Reason for CRM: patient son Bambi Lever called stated that the application is reopened and he is requesting to have an FL2 sent to [facility] fax number: [phone]. and also sent to [facility] fax number: [phone]. He would like a call back at [phone] when fax has ben sent.

## 2023-05-14 NOTE — Telephone Encounter (Signed)
Patient is not a patient in our office

## 2023-05-14 NOTE — Telephone Encounter (Signed)
 Copied from CRM [id number]. Topic: General - Call Back - No Documentation >> May 14, 2023  9:08 AM Megan Peterson B wrote: Reason for CRM: patients son is calling about fl2 form said he was told that paper would be sent on 05/09 but still has not and in reading the notes as of 05/08 it says patient was denied please call patient son to give clarification  [phone]

## 2023-05-15 NOTE — Telephone Encounter (Signed)
 Patient soon Megan Peterson notified that the [facility] forms have been sent to the 2 requested facilities.

## 2023-05-15 NOTE — Telephone Encounter (Signed)
 Closing encounter. This is separate from the 2 FL2 forms that were completed. I spoke with Bambi Lever the son and advise that he would need to speak with Crystal because per Crystal once patient is in an actual LTC facility then she will request the FL2 from them and there is nothing for me to do on my end. He states that he is aware.

## 2023-05-17 ENCOUNTER — Telehealth: Payer: Self-pay | Admitting: Family Medicine

## 2023-05-17 NOTE — Telephone Encounter (Signed)
 Copied from CRM [id number]. Topic: General - Other >> May 17, 2023  4:13 PM Taleah C wrote: Reason for CRM: Pt's son, Bambi Lever, called and stated that the nursing facility is requesting additional information to be sent to [facility] [phone], Fax: [phone]. He stated he thinks they need past appts and clinical info. Please call and advise.

## 2023-05-18 NOTE — Telephone Encounter (Signed)
 Reached out to [facility] and left a message for Elijio Guadeloupe the Admission Coordinator to clarify what all they needed for the patient. In the meantime I did fax over the last office notes. Will send what is needed once I receive a call back.

## 2023-05-18 NOTE — Telephone Encounter (Signed)
 Returned call to [facility] and faxed the additional information she needed

## 2023-05-18 NOTE — Telephone Encounter (Signed)
 Copied from CRM [id number]. Topic: General - Call Back - No Documentation >> May 18, 2023 10:11 AM Rosaria Common wrote: Reason for CRM: Truett Gab returning phone call from [facility]. Callback number is [phone] ex [phone]

## 2023-05-29 DIAGNOSIS — J9611 Chronic respiratory failure with hypoxia: Secondary | ICD-10-CM | POA: Diagnosis not present

## 2023-06-04 DIAGNOSIS — M6259 Muscle wasting and atrophy, not elsewhere classified, multiple sites: Secondary | ICD-10-CM | POA: Diagnosis not present

## 2023-06-05 DIAGNOSIS — M549 Dorsalgia, unspecified: Secondary | ICD-10-CM | POA: Diagnosis not present

## 2023-06-05 DIAGNOSIS — G8929 Other chronic pain: Secondary | ICD-10-CM | POA: Diagnosis not present

## 2023-06-05 DIAGNOSIS — R5381 Other malaise: Secondary | ICD-10-CM | POA: Diagnosis not present

## 2023-06-05 DIAGNOSIS — M5430 Sciatica, unspecified side: Secondary | ICD-10-CM | POA: Diagnosis not present

## 2023-06-05 DIAGNOSIS — M6259 Muscle wasting and atrophy, not elsewhere classified, multiple sites: Secondary | ICD-10-CM | POA: Diagnosis not present

## 2023-06-06 ENCOUNTER — Telehealth: Payer: Self-pay

## 2023-06-06 DIAGNOSIS — M6259 Muscle wasting and atrophy, not elsewhere classified, multiple sites: Secondary | ICD-10-CM | POA: Diagnosis not present

## 2023-06-06 DIAGNOSIS — J439 Emphysema, unspecified: Secondary | ICD-10-CM | POA: Diagnosis not present

## 2023-06-06 DIAGNOSIS — G8929 Other chronic pain: Secondary | ICD-10-CM | POA: Diagnosis not present

## 2023-06-06 DIAGNOSIS — R5381 Other malaise: Secondary | ICD-10-CM | POA: Diagnosis not present

## 2023-06-06 NOTE — Telephone Encounter (Signed)
 Returned call to [facility] and spoke with Sam. They were wondering is there a diagnosis for dementia. Advised showing memory loss that is progressing. Patient will now be at the facilty for long term care. They are requesting last office notes be faxed to [phone]. Faxed. Encounter closed.

## 2023-06-06 NOTE — Telephone Encounter (Signed)
 Copied from CRM [id number]. Topic: Clinical - Medication Question >> Jun 06, 2023 11:46 AM Martinique E wrote: Reason for CRM: Melissa from [facility] called in wanting to speak with patient's care team about patient's medical diagnoses. Callback number for Lovett Ruck is [phone] Ext. [phone].

## 2023-06-07 DIAGNOSIS — I10 Essential (primary) hypertension: Secondary | ICD-10-CM | POA: Diagnosis not present

## 2023-06-07 DIAGNOSIS — M6259 Muscle wasting and atrophy, not elsewhere classified, multiple sites: Secondary | ICD-10-CM | POA: Diagnosis not present

## 2023-06-08 DIAGNOSIS — M6259 Muscle wasting and atrophy, not elsewhere classified, multiple sites: Secondary | ICD-10-CM | POA: Diagnosis not present

## 2023-06-08 DIAGNOSIS — T8090XA Unspecified complication following infusion and therapeutic injection, initial encounter: Secondary | ICD-10-CM | POA: Diagnosis not present

## 2023-06-08 DIAGNOSIS — R238 Other skin changes: Secondary | ICD-10-CM | POA: Diagnosis not present

## 2023-06-09 DIAGNOSIS — J9611 Chronic respiratory failure with hypoxia: Secondary | ICD-10-CM | POA: Diagnosis not present

## 2023-06-11 DIAGNOSIS — M6259 Muscle wasting and atrophy, not elsewhere classified, multiple sites: Secondary | ICD-10-CM | POA: Diagnosis not present

## 2023-06-12 DIAGNOSIS — M6259 Muscle wasting and atrophy, not elsewhere classified, multiple sites: Secondary | ICD-10-CM | POA: Diagnosis not present

## 2023-06-13 DIAGNOSIS — M6259 Muscle wasting and atrophy, not elsewhere classified, multiple sites: Secondary | ICD-10-CM | POA: Diagnosis not present

## 2023-06-14 DIAGNOSIS — M6259 Muscle wasting and atrophy, not elsewhere classified, multiple sites: Secondary | ICD-10-CM | POA: Diagnosis not present

## 2023-06-15 DIAGNOSIS — M6259 Muscle wasting and atrophy, not elsewhere classified, multiple sites: Secondary | ICD-10-CM | POA: Diagnosis not present

## 2023-06-18 DIAGNOSIS — M6259 Muscle wasting and atrophy, not elsewhere classified, multiple sites: Secondary | ICD-10-CM | POA: Diagnosis not present

## 2023-06-20 DIAGNOSIS — Z79899 Other long term (current) drug therapy: Secondary | ICD-10-CM | POA: Diagnosis not present

## 2023-06-22 DIAGNOSIS — R8290 Unspecified abnormal findings in urine: Secondary | ICD-10-CM | POA: Diagnosis not present

## 2023-06-22 DIAGNOSIS — G8929 Other chronic pain: Secondary | ICD-10-CM | POA: Diagnosis not present

## 2023-06-28 DIAGNOSIS — R454 Irritability and anger: Secondary | ICD-10-CM | POA: Diagnosis not present

## 2023-06-29 DIAGNOSIS — J9611 Chronic respiratory failure with hypoxia: Secondary | ICD-10-CM | POA: Diagnosis not present

## 2023-07-09 DIAGNOSIS — H01009 Unspecified blepharitis unspecified eye, unspecified eyelid: Secondary | ICD-10-CM | POA: Diagnosis not present

## 2023-07-09 DIAGNOSIS — J9611 Chronic respiratory failure with hypoxia: Secondary | ICD-10-CM | POA: Diagnosis not present

## 2023-07-12 DIAGNOSIS — M6259 Muscle wasting and atrophy, not elsewhere classified, multiple sites: Secondary | ICD-10-CM | POA: Diagnosis not present

## 2023-07-12 DIAGNOSIS — H01009 Unspecified blepharitis unspecified eye, unspecified eyelid: Secondary | ICD-10-CM | POA: Diagnosis not present

## 2023-07-12 DIAGNOSIS — Z7409 Other reduced mobility: Secondary | ICD-10-CM | POA: Diagnosis not present

## 2023-07-12 DIAGNOSIS — R2681 Unsteadiness on feet: Secondary | ICD-10-CM | POA: Diagnosis not present

## 2023-07-14 DIAGNOSIS — Z79899 Other long term (current) drug therapy: Secondary | ICD-10-CM | POA: Diagnosis not present

## 2023-07-16 DIAGNOSIS — R2681 Unsteadiness on feet: Secondary | ICD-10-CM | POA: Diagnosis not present

## 2023-07-16 DIAGNOSIS — Z7409 Other reduced mobility: Secondary | ICD-10-CM | POA: Diagnosis not present

## 2023-07-16 DIAGNOSIS — M6259 Muscle wasting and atrophy, not elsewhere classified, multiple sites: Secondary | ICD-10-CM | POA: Diagnosis not present

## 2023-07-16 DIAGNOSIS — N390 Urinary tract infection, site not specified: Secondary | ICD-10-CM | POA: Diagnosis not present

## 2023-07-17 DIAGNOSIS — J439 Emphysema, unspecified: Secondary | ICD-10-CM | POA: Diagnosis not present

## 2023-07-17 DIAGNOSIS — Z7409 Other reduced mobility: Secondary | ICD-10-CM | POA: Diagnosis not present

## 2023-07-17 DIAGNOSIS — M6259 Muscle wasting and atrophy, not elsewhere classified, multiple sites: Secondary | ICD-10-CM | POA: Diagnosis not present

## 2023-07-17 DIAGNOSIS — R2681 Unsteadiness on feet: Secondary | ICD-10-CM | POA: Diagnosis not present

## 2023-07-17 DIAGNOSIS — N390 Urinary tract infection, site not specified: Secondary | ICD-10-CM | POA: Diagnosis not present

## 2023-07-17 DIAGNOSIS — I10 Essential (primary) hypertension: Secondary | ICD-10-CM | POA: Diagnosis not present

## 2023-07-17 DIAGNOSIS — H01009 Unspecified blepharitis unspecified eye, unspecified eyelid: Secondary | ICD-10-CM | POA: Diagnosis not present

## 2023-07-19 DIAGNOSIS — N390 Urinary tract infection, site not specified: Secondary | ICD-10-CM | POA: Diagnosis not present

## 2023-07-19 DIAGNOSIS — E860 Dehydration: Secondary | ICD-10-CM | POA: Diagnosis not present

## 2023-07-20 DIAGNOSIS — R2681 Unsteadiness on feet: Secondary | ICD-10-CM | POA: Diagnosis not present

## 2023-07-20 DIAGNOSIS — M6259 Muscle wasting and atrophy, not elsewhere classified, multiple sites: Secondary | ICD-10-CM | POA: Diagnosis not present

## 2023-07-20 DIAGNOSIS — Z7409 Other reduced mobility: Secondary | ICD-10-CM | POA: Diagnosis not present

## 2023-07-23 DIAGNOSIS — R2681 Unsteadiness on feet: Secondary | ICD-10-CM | POA: Diagnosis not present

## 2023-07-23 DIAGNOSIS — M6259 Muscle wasting and atrophy, not elsewhere classified, multiple sites: Secondary | ICD-10-CM | POA: Diagnosis not present

## 2023-07-23 DIAGNOSIS — Z7409 Other reduced mobility: Secondary | ICD-10-CM | POA: Diagnosis not present

## 2023-07-25 DIAGNOSIS — Z7409 Other reduced mobility: Secondary | ICD-10-CM | POA: Diagnosis not present

## 2023-07-25 DIAGNOSIS — M6259 Muscle wasting and atrophy, not elsewhere classified, multiple sites: Secondary | ICD-10-CM | POA: Diagnosis not present

## 2023-07-25 DIAGNOSIS — R2681 Unsteadiness on feet: Secondary | ICD-10-CM | POA: Diagnosis not present

## 2023-07-26 DIAGNOSIS — Z7409 Other reduced mobility: Secondary | ICD-10-CM | POA: Diagnosis not present

## 2023-07-26 DIAGNOSIS — M6259 Muscle wasting and atrophy, not elsewhere classified, multiple sites: Secondary | ICD-10-CM | POA: Diagnosis not present

## 2023-07-26 DIAGNOSIS — R2681 Unsteadiness on feet: Secondary | ICD-10-CM | POA: Diagnosis not present

## 2023-07-28 DIAGNOSIS — M6259 Muscle wasting and atrophy, not elsewhere classified, multiple sites: Secondary | ICD-10-CM | POA: Diagnosis not present

## 2023-07-28 DIAGNOSIS — R2681 Unsteadiness on feet: Secondary | ICD-10-CM | POA: Diagnosis not present

## 2023-07-28 DIAGNOSIS — Z7409 Other reduced mobility: Secondary | ICD-10-CM | POA: Diagnosis not present

## 2023-07-29 DIAGNOSIS — J9611 Chronic respiratory failure with hypoxia: Secondary | ICD-10-CM | POA: Diagnosis not present

## 2023-07-30 DIAGNOSIS — Z7409 Other reduced mobility: Secondary | ICD-10-CM | POA: Diagnosis not present

## 2023-07-30 DIAGNOSIS — M6259 Muscle wasting and atrophy, not elsewhere classified, multiple sites: Secondary | ICD-10-CM | POA: Diagnosis not present

## 2023-07-30 DIAGNOSIS — R2681 Unsteadiness on feet: Secondary | ICD-10-CM | POA: Diagnosis not present

## 2023-08-02 DIAGNOSIS — M6259 Muscle wasting and atrophy, not elsewhere classified, multiple sites: Secondary | ICD-10-CM | POA: Diagnosis not present

## 2023-08-02 DIAGNOSIS — Z7409 Other reduced mobility: Secondary | ICD-10-CM | POA: Diagnosis not present

## 2023-08-02 DIAGNOSIS — R2681 Unsteadiness on feet: Secondary | ICD-10-CM | POA: Diagnosis not present

## 2023-08-03 ENCOUNTER — Ambulatory Visit: Admitting: Family Medicine

## 2023-08-06 DIAGNOSIS — R2681 Unsteadiness on feet: Secondary | ICD-10-CM | POA: Diagnosis not present

## 2023-08-06 DIAGNOSIS — Z7409 Other reduced mobility: Secondary | ICD-10-CM | POA: Diagnosis not present

## 2023-08-09 DIAGNOSIS — J9611 Chronic respiratory failure with hypoxia: Secondary | ICD-10-CM | POA: Diagnosis not present

## 2023-08-09 DIAGNOSIS — R609 Edema, unspecified: Secondary | ICD-10-CM | POA: Diagnosis not present

## 2023-08-09 DIAGNOSIS — R0989 Other specified symptoms and signs involving the circulatory and respiratory systems: Secondary | ICD-10-CM | POA: Diagnosis not present

## 2023-08-14 DIAGNOSIS — R609 Edema, unspecified: Secondary | ICD-10-CM | POA: Diagnosis not present

## 2023-08-15 DIAGNOSIS — I10 Essential (primary) hypertension: Secondary | ICD-10-CM | POA: Diagnosis not present

## 2023-08-16 DIAGNOSIS — N179 Acute kidney failure, unspecified: Secondary | ICD-10-CM | POA: Diagnosis not present

## 2023-08-16 DIAGNOSIS — M5451 Vertebrogenic low back pain: Secondary | ICD-10-CM | POA: Diagnosis not present

## 2023-08-16 DIAGNOSIS — Z789 Other specified health status: Secondary | ICD-10-CM | POA: Diagnosis not present

## 2023-08-20 DIAGNOSIS — M5451 Vertebrogenic low back pain: Secondary | ICD-10-CM | POA: Diagnosis not present

## 2023-08-21 DIAGNOSIS — Z789 Other specified health status: Secondary | ICD-10-CM | POA: Diagnosis not present

## 2023-08-23 DIAGNOSIS — Z79899 Other long term (current) drug therapy: Secondary | ICD-10-CM | POA: Diagnosis not present

## 2023-08-23 DIAGNOSIS — J069 Acute upper respiratory infection, unspecified: Secondary | ICD-10-CM | POA: Diagnosis not present

## 2023-08-24 DIAGNOSIS — R454 Irritability and anger: Secondary | ICD-10-CM | POA: Diagnosis not present

## 2023-08-24 DIAGNOSIS — R8290 Unspecified abnormal findings in urine: Secondary | ICD-10-CM | POA: Diagnosis not present

## 2023-08-29 DIAGNOSIS — J9611 Chronic respiratory failure with hypoxia: Secondary | ICD-10-CM | POA: Diagnosis not present

## 2023-08-30 DIAGNOSIS — Z79899 Other long term (current) drug therapy: Secondary | ICD-10-CM | POA: Diagnosis not present

## 2023-08-30 DIAGNOSIS — M6259 Muscle wasting and atrophy, not elsewhere classified, multiple sites: Secondary | ICD-10-CM | POA: Diagnosis not present

## 2023-08-30 DIAGNOSIS — R2681 Unsteadiness on feet: Secondary | ICD-10-CM | POA: Diagnosis not present

## 2023-08-30 DIAGNOSIS — Z7409 Other reduced mobility: Secondary | ICD-10-CM | POA: Diagnosis not present

## 2023-08-31 DIAGNOSIS — Z7409 Other reduced mobility: Secondary | ICD-10-CM | POA: Diagnosis not present

## 2023-08-31 DIAGNOSIS — I2510 Atherosclerotic heart disease of native coronary artery without angina pectoris: Secondary | ICD-10-CM | POA: Diagnosis not present

## 2023-08-31 DIAGNOSIS — M6259 Muscle wasting and atrophy, not elsewhere classified, multiple sites: Secondary | ICD-10-CM | POA: Diagnosis not present

## 2023-08-31 DIAGNOSIS — J439 Emphysema, unspecified: Secondary | ICD-10-CM | POA: Diagnosis not present

## 2023-08-31 DIAGNOSIS — R2681 Unsteadiness on feet: Secondary | ICD-10-CM | POA: Diagnosis not present

## 2023-08-31 DIAGNOSIS — N179 Acute kidney failure, unspecified: Secondary | ICD-10-CM | POA: Diagnosis not present

## 2023-09-04 DIAGNOSIS — M6259 Muscle wasting and atrophy, not elsewhere classified, multiple sites: Secondary | ICD-10-CM | POA: Diagnosis not present

## 2023-09-10 DIAGNOSIS — M6259 Muscle wasting and atrophy, not elsewhere classified, multiple sites: Secondary | ICD-10-CM | POA: Diagnosis not present

## 2023-09-13 DIAGNOSIS — M6259 Muscle wasting and atrophy, not elsewhere classified, multiple sites: Secondary | ICD-10-CM | POA: Diagnosis not present

## 2023-10-01 DIAGNOSIS — I2510 Atherosclerotic heart disease of native coronary artery without angina pectoris: Secondary | ICD-10-CM | POA: Diagnosis not present

## 2023-10-01 DIAGNOSIS — J439 Emphysema, unspecified: Secondary | ICD-10-CM | POA: Diagnosis not present

## 2023-10-01 DIAGNOSIS — Z8673 Personal history of transient ischemic attack (TIA), and cerebral infarction without residual deficits: Secondary | ICD-10-CM | POA: Diagnosis not present

## 2023-10-09 DIAGNOSIS — J9611 Chronic respiratory failure with hypoxia: Secondary | ICD-10-CM | POA: Diagnosis not present

## 2023-10-18 DIAGNOSIS — G4709 Other insomnia: Secondary | ICD-10-CM | POA: Diagnosis not present

## 2023-10-18 DIAGNOSIS — R251 Tremor, unspecified: Secondary | ICD-10-CM | POA: Diagnosis not present

## 2023-10-29 DIAGNOSIS — J9611 Chronic respiratory failure with hypoxia: Secondary | ICD-10-CM | POA: Diagnosis not present

## 2023-11-15 NOTE — Progress Notes (Incomplete)
 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 Patient is currently on memantine  10 mg twice daily.  Patient is able to participate on ADLs with some assistance, has HHN for safety***and to drive without significant difficulties.  Mood is stable***    Follow up in   months. Continue memantine  10 mg twice daily given side effects discussed Recommend good control of her cardiovascular risk factors Continue to control mood as per PCP     Subjective:    This patient is accompanied in the office by her son*** who supplements the history.  Previous records as well as any outside records available were reviewed prior to todays visit. Patient was last seen on 05/04/2023***   Any changes in memory since last visit? .  She cannot remember new information, or operate appliances such as the TV or the phone, she enjoys watching westerns (Gunsmoke).  repeats oneself?  Endorsed Disoriented when walking into a room?  Seldom***  Leaving objects?  May misplace things and then blames others.  Likes to collect Kleenex  ***  Wandering behavior?  denies   Any personality changes since last visit?  Denies.  As before, she may have some crying spells. Any worsening depression?:  Denies.   Hallucinations or paranoia?  Occasionally may hear knocking on the door when no one is there. Seizures? denies    Any sleep changes?  Sleeps in a recliner, does not wear her oxygen  at night.  Denies vivid dreams, REM behavior or sleepwalking   Sleep apnea?   Denies.   Any hygiene concerns? Denies.  She showers 2 times a week with the help of HHN Independent of bathing and dressing?  Needs some help to get dressed although she tries. Does the patient needs help with  medications?  Son is in charge *** Who is in charge of the finances?  Son is in charge, bills are on autopay   *** Any changes in appetite?  denies.  She forgets that she ate and she eats again.  She does not drink enough water***   Patient have trouble swallowing? Denies.   Does the patient cook? No Any headaches?   denies   Any vision changes?*** Chronic back pain  denies   Ambulates with difficulty?  Uses a walker for stability, but does not walk very far.  When she is outside she is in a wheelchair..  *** Recent falls or head injuries? Denies.     Unilateral weakness, numbness or tingling? denies   Any tremors?  Denies  *** Any anosmia?  Denies   Any incontinence of urine?  Endorsed, wears diapers***  Any bowel dysfunction?   Denies      Patient lives with her son 5 times a week   *** Does the patient drive? No longer drives ***  HISTORY: She started noticing memory problems in late 2019-early 2020.  At first, she forgot about food that she cooked.  She started forgetting to take her medications.  Her daughter now has to call and remind her three times a day.  She started frequently misplacing objects. .  TSH from October 2020 was 1.90.  Labs from February 2021 include B12 588, folate 17.7, MMA 200, homocysteine 15, and RPR non-reactive.  She  had an MRI of the brain without contrast on 03/11/2019 which was personally reviewed and showed mild chronic small vessel ischemic changes but otherwise unremarkable. She lives alone.  She goes grocery shopping.  She drives to the grocery store about 2 miles away.  She drives to church 7 miles away.  She drives to her daughter 12 miles away.  Sometimes she will drive to see her son and grandchildren who are 30 miles away.  Her daughter has rode in the passenger seat with her and has not been concerned about her driving.  She manages her own finances.  However, her daughter looked over her bills and noted that she had paid some bills twice and forgot to pay  other bills.  She keeps her home clean.  She reports that she does not leave on the stove.  She socializes with her neighbors everyday.  She sleeps well.  Her appetite is good.  She denies depression.   Family history: Her sister may have dementia but not officially diagnosed.  Maternal uncle may have had dementia.  Her mother passed away at age 60.  Her father passed away at younger age.    PREVIOUS MEDICATIONS: Donepezil  (too drowsy during the day)    CURRENT MEDICATIONS:  Outpatient Encounter Medications as of 11/16/2023  Medication Sig   albuterol  (ACCUNEB ) 0.63 MG/3ML nebulizer solution Take 3 mLs (0.63 mg total) by nebulization every 6 (six) hours as needed for wheezing.   albuterol  (VENTOLIN  HFA) 108 (90 Base) MCG/ACT inhaler INHALE 1 TO 2 PUFFS BY MOUTH EVERY 6 HOURS AS NEEDED FOR WHEEZING FOR SHORTNESS OF BREATH   cyanocobalamin  (VITAMIN B12) 1000 MCG tablet Take 1 tablet (1,000 mcg total) by mouth daily.   Fluticasone -Umeclidin-Vilant (TRELEGY ELLIPTA ) 100-62.5-25 MCG/ACT AEPB Inhale 1 puff into the lungs daily.   furosemide  (LASIX ) 20 MG tablet TAKE 1 TABLET BY MOUTH ONCE DAILY AS NEEDED IN THE MORNING FOR FLUID   gabapentin  (NEURONTIN ) 400 MG capsule Take 1 capsule (400 mg total) by mouth 3 (three) times daily.   HYDROcodone -acetaminophen  (NORCO/VICODIN) 5-325 MG tablet Take 1 tablet by mouth every 6 (six) hours as needed for moderate pain (pain score 4-6).   hydrOXYzine  (ATARAX ) 10 MG tablet Take 1 tablet (10 mg total) by mouth 3 (three) times daily as needed for anxiety (sedation caution).   memantine  (NAMENDA ) 10 MG tablet Take 1 tablet (10 mg total) by mouth 2 (two) times daily.   metoprolol  succinate (TOPROL -XL) 25 MG 24 hr tablet Take 1 tablet (25 mg total) by mouth daily.   nitroGLYCERIN  (NITROSTAT ) 0.4 MG SL tablet Place 1 tablet (0.4 mg total) under the tongue every 5 (five) minutes as needed for chest pain (max 3 doses in 15 minutes.  if still having chest pain then go to  the ER).   OXYGEN  Inhale 2 L/min into the lungs as needed (for shortness of breath).   polyethylene glycol (MIRALAX  / GLYCOLAX ) 17 g packet Take 17 g by mouth daily as needed for mild constipation.   Spacer/Aero-Holding Chambers (OPTICHAMBER ADVANTAGE-LG MASK) MISC 1 each by Does not apply route as needed.   triamcinolone  cream (KENALOG ) 0.5 % Put on rash on the legs twice a day as needed.   No facility-administered encounter medications on file as of 11/16/2023.       02/04/2019   10:09 AM 11/18/2018    2:00 PM 04/13/2017   12:17 PM  MMSE - Mini Mental State Exam  Orientation to time 5 5 5   Orientation  to Place 5 5 5   Registration 3 3 3   Attention/ Calculation 2 5 0  Recall 2 3 3   Language- name 2 objects 2  0  Language- repeat 1 1 1   Language- follow 3 step command 3  3  Language- read & follow direction 1  0  Write a sentence 1  0  Copy design 0  0  Total score 25  20       No data to display          Objective:     PHYSICAL EXAMINATION:    VITALS:  There were no vitals filed for this visit.  GEN:  The patient appears stated age and is in NAD. HEENT:  Normocephalic, atraumatic.   Neurological examination:  General: NAD, well-groomed, appears stated age. Orientation: The patient is alert. Oriented to person, not to place and date Cranial nerves: There is good facial symmetry.The speech is fluent and clear. No aphasia or dysarthria. Fund of knowledge is reduced. Recent and remote memory are impaired. Attention and concentration are reduced. Able to name objects and repeat phrases.  Hearing is intact to conversational tone. *** Sensation: Sensation is intact to light touch throughout Motor: Strength is at least antigravity x4. DTR's 2/4 in UE/LE     Movement examination: Tone: There is normal tone in the UE/LE Abnormal movements:  no tremor.  No myoclonus.  No asterixis.   Coordination:  There is no decremation with RAM's. Normal finger to nose  Gait and  Station: The patient has some difficulty arising out of her wheelchair without the use of the hands. The patient's stride length is short, gait is cautious and narrow.    Thank you for allowing us  the opportunity to participate in the care of this nice patient. Please do not hesitate to contact us  for any questions or concerns.   Total time spent on today's visit was *** minutes dedicated to this patient today, preparing to see patient, examining the patient, ordering tests and/or medications and counseling the patient, documenting clinical information in the EHR or other health record, independently interpreting results and communicating results to the patient/family, discussing treatment and goals, answering patient's questions and coordinating care.  Cc:  Cleatus Arlyss RAMAN, MD  Camie Sevin 11/15/2023 7:11 AM

## 2023-11-16 ENCOUNTER — Ambulatory Visit: Admitting: Physician Assistant

## 2024-01-01 ENCOUNTER — Telehealth: Payer: Self-pay | Admitting: Emergency Medicine

## 2024-01-01 DIAGNOSIS — C44329 Squamous cell carcinoma of skin of other parts of face: Secondary | ICD-10-CM | POA: Insufficient documentation

## 2024-01-01 DIAGNOSIS — S43006A Unspecified dislocation of unspecified shoulder joint, initial encounter: Secondary | ICD-10-CM | POA: Insufficient documentation

## 2024-01-01 DIAGNOSIS — H3322 Serous retinal detachment, left eye: Secondary | ICD-10-CM | POA: Insufficient documentation

## 2024-01-02 ENCOUNTER — Ambulatory Visit
